# Patient Record
Sex: Male | Born: 1950 | Race: White | Hispanic: No | Marital: Married | Smoking: Never smoker
Health system: Southern US, Community
[De-identification: ages and names within clinical notes are randomized; demographics above are authoritative.]

## PROBLEM LIST (undated history)

## (undated) DIAGNOSIS — T4145XA Adverse effect of unspecified anesthetic, initial encounter: Secondary | ICD-10-CM

## (undated) DIAGNOSIS — K219 Gastro-esophageal reflux disease without esophagitis: Secondary | ICD-10-CM

## (undated) DIAGNOSIS — M1990 Unspecified osteoarthritis, unspecified site: Secondary | ICD-10-CM

## (undated) DIAGNOSIS — A6920 Lyme disease, unspecified: Secondary | ICD-10-CM

## (undated) DIAGNOSIS — R112 Nausea with vomiting, unspecified: Secondary | ICD-10-CM

## (undated) DIAGNOSIS — Z8719 Personal history of other diseases of the digestive system: Secondary | ICD-10-CM

## (undated) DIAGNOSIS — G20 Parkinson's disease: Secondary | ICD-10-CM

## (undated) DIAGNOSIS — T8859XA Other complications of anesthesia, initial encounter: Secondary | ICD-10-CM

## (undated) DIAGNOSIS — N138 Other obstructive and reflux uropathy: Secondary | ICD-10-CM

## (undated) DIAGNOSIS — K5790 Diverticulosis of intestine, part unspecified, without perforation or abscess without bleeding: Secondary | ICD-10-CM

## (undated) DIAGNOSIS — K224 Dyskinesia of esophagus: Secondary | ICD-10-CM

## (undated) DIAGNOSIS — A770 Spotted fever due to Rickettsia rickettsii: Secondary | ICD-10-CM

## (undated) DIAGNOSIS — E785 Hyperlipidemia, unspecified: Secondary | ICD-10-CM

## (undated) DIAGNOSIS — N401 Enlarged prostate with lower urinary tract symptoms: Secondary | ICD-10-CM

## (undated) DIAGNOSIS — K2270 Barrett's esophagus without dysplasia: Secondary | ICD-10-CM

## (undated) DIAGNOSIS — G20A1 Parkinson's disease without dyskinesia, without mention of fluctuations: Secondary | ICD-10-CM

## (undated) DIAGNOSIS — G4700 Insomnia, unspecified: Secondary | ICD-10-CM

## (undated) DIAGNOSIS — Z87442 Personal history of urinary calculi: Secondary | ICD-10-CM

## (undated) DIAGNOSIS — Z9889 Other specified postprocedural states: Secondary | ICD-10-CM

## (undated) HISTORY — PX: HERNIA REPAIR: SHX51

## (undated) HISTORY — DX: Spotted fever due to Rickettsia rickettsii: A77.0

## (undated) HISTORY — PX: LASIK: SHX215

## (undated) HISTORY — DX: Gastro-esophageal reflux disease without esophagitis: K21.9

## (undated) HISTORY — DX: Benign prostatic hyperplasia with lower urinary tract symptoms: N40.1

## (undated) HISTORY — DX: Parkinson's disease: G20

## (undated) HISTORY — DX: Barrett's esophagus without dysplasia: K22.70

## (undated) HISTORY — DX: Insomnia, unspecified: G47.00

## (undated) HISTORY — PX: TONSILLECTOMY AND ADENOIDECTOMY: SUR1326

## (undated) HISTORY — PX: CHOLECYSTECTOMY: SHX55

## (undated) HISTORY — PX: ORCHIOPEXY: SHX479

## (undated) HISTORY — DX: Personal history of other diseases of the digestive system: Z87.19

## (undated) HISTORY — DX: Diverticulosis of intestine, part unspecified, without perforation or abscess without bleeding: K57.90

## (undated) HISTORY — DX: Dyskinesia of esophagus: K22.4

## (undated) HISTORY — DX: Unspecified osteoarthritis, unspecified site: M19.90

## (undated) HISTORY — DX: Parkinson's disease without dyskinesia, without mention of fluctuations: G20.A1

## (undated) HISTORY — DX: Hyperlipidemia, unspecified: E78.5

## (undated) HISTORY — DX: Other obstructive and reflux uropathy: N13.8

---

## 2003-06-28 ENCOUNTER — Encounter: Payer: Self-pay | Admitting: Internal Medicine

## 2005-01-05 ENCOUNTER — Ambulatory Visit: Payer: Self-pay | Admitting: Family Medicine

## 2005-01-17 ENCOUNTER — Ambulatory Visit: Payer: Self-pay | Admitting: Family Medicine

## 2005-01-28 ENCOUNTER — Encounter: Admission: RE | Admit: 2005-01-28 | Discharge: 2005-01-28 | Payer: Self-pay | Admitting: Family Medicine

## 2005-06-11 ENCOUNTER — Ambulatory Visit: Payer: Self-pay | Admitting: Family Medicine

## 2006-01-15 ENCOUNTER — Ambulatory Visit: Payer: Self-pay | Admitting: Family Medicine

## 2006-01-23 ENCOUNTER — Ambulatory Visit: Payer: Self-pay | Admitting: Family Medicine

## 2006-02-14 ENCOUNTER — Ambulatory Visit: Payer: Self-pay | Admitting: Internal Medicine

## 2006-03-13 ENCOUNTER — Ambulatory Visit: Payer: Self-pay | Admitting: Family Medicine

## 2006-05-31 ENCOUNTER — Ambulatory Visit: Payer: Self-pay | Admitting: Internal Medicine

## 2006-07-09 ENCOUNTER — Emergency Department (HOSPITAL_COMMUNITY): Admission: EM | Admit: 2006-07-09 | Discharge: 2006-07-10 | Payer: Self-pay | Admitting: Emergency Medicine

## 2006-07-09 ENCOUNTER — Encounter (INDEPENDENT_AMBULATORY_CARE_PROVIDER_SITE_OTHER): Payer: Self-pay | Admitting: *Deleted

## 2006-11-25 ENCOUNTER — Ambulatory Visit: Payer: Self-pay | Admitting: Family Medicine

## 2006-12-11 ENCOUNTER — Ambulatory Visit: Payer: Self-pay | Admitting: Family Medicine

## 2007-01-20 ENCOUNTER — Ambulatory Visit: Payer: Self-pay | Admitting: Family Medicine

## 2007-01-20 LAB — CONVERTED CEMR LAB
ALT: 34 units/L (ref 0–53)
AST: 32 units/L (ref 0–37)
Alkaline Phosphatase: 36 units/L — ABNORMAL LOW (ref 39–117)
BUN: 18 mg/dL (ref 6–23)
Basophils Relative: 0.8 % (ref 0.0–1.0)
CO2: 32 meq/L (ref 19–32)
Calcium: 9.6 mg/dL (ref 8.4–10.5)
Chloride: 109 meq/L (ref 96–112)
Cholesterol: 177 mg/dL (ref 0–200)
Direct LDL: 102 mg/dL
Eosinophils Absolute: 0.2 10*3/uL (ref 0.0–0.6)
Eosinophils Relative: 3.3 % (ref 0.0–5.0)
GFR calc Af Amer: 113 mL/min
HDL: 39.7 mg/dL (ref >39.0)
Monocytes Relative: 8.5 % (ref 3.0–11.0)
Platelets: 279 10*3/uL (ref 150–400)
RBC: 4.75 M/uL (ref 4.22–5.81)
TSH: 1.85 microintl units/mL (ref 0.35–5.50)
Total Protein: 6.7 g/dL (ref 6.0–8.3)
Triglycerides: 206 mg/dL (ref 0–149)
VLDL: 41 mg/dL — ABNORMAL HIGH (ref 0–40)
WBC: 5.6 10*3/uL (ref 4.5–10.5)

## 2007-01-22 ENCOUNTER — Encounter: Payer: Self-pay | Admitting: Family Medicine

## 2007-01-28 DIAGNOSIS — E785 Hyperlipidemia, unspecified: Secondary | ICD-10-CM

## 2007-01-28 DIAGNOSIS — K219 Gastro-esophageal reflux disease without esophagitis: Secondary | ICD-10-CM

## 2007-02-03 ENCOUNTER — Ambulatory Visit: Payer: Self-pay | Admitting: Family Medicine

## 2007-02-03 DIAGNOSIS — N138 Other obstructive and reflux uropathy: Secondary | ICD-10-CM | POA: Insufficient documentation

## 2007-02-03 DIAGNOSIS — G4700 Insomnia, unspecified: Secondary | ICD-10-CM

## 2007-02-03 DIAGNOSIS — N401 Enlarged prostate with lower urinary tract symptoms: Secondary | ICD-10-CM

## 2007-02-12 ENCOUNTER — Telehealth: Payer: Self-pay | Admitting: Family Medicine

## 2007-03-19 ENCOUNTER — Ambulatory Visit: Payer: Self-pay | Admitting: Internal Medicine

## 2007-07-18 ENCOUNTER — Telehealth (INDEPENDENT_AMBULATORY_CARE_PROVIDER_SITE_OTHER): Payer: Self-pay | Admitting: *Deleted

## 2007-08-11 ENCOUNTER — Ambulatory Visit: Payer: Self-pay | Admitting: Family Medicine

## 2007-08-15 ENCOUNTER — Telehealth: Payer: Self-pay | Admitting: Family Medicine

## 2007-09-09 ENCOUNTER — Telehealth: Payer: Self-pay | Admitting: Family Medicine

## 2008-01-14 ENCOUNTER — Ambulatory Visit: Payer: Self-pay | Admitting: Family Medicine

## 2008-01-15 ENCOUNTER — Telehealth (INDEPENDENT_AMBULATORY_CARE_PROVIDER_SITE_OTHER): Payer: Self-pay | Admitting: *Deleted

## 2008-02-04 ENCOUNTER — Telehealth: Payer: Self-pay | Admitting: Family Medicine

## 2008-02-18 ENCOUNTER — Ambulatory Visit: Payer: Self-pay | Admitting: Family Medicine

## 2008-02-18 LAB — CONVERTED CEMR LAB
Bilirubin Urine: NEGATIVE
Blood in Urine, dipstick: NEGATIVE
Ketones, urine, test strip: NEGATIVE
Nitrite: NEGATIVE
Protein, U semiquant: NEGATIVE
Urobilinogen, UA: 1

## 2008-02-19 LAB — CONVERTED CEMR LAB
ALT: 48 units/L (ref 0–53)
AST: 33 units/L (ref 0–37)
Albumin: 4.1 g/dL (ref 3.5–5.2)
Alkaline Phosphatase: 33 units/L — ABNORMAL LOW (ref 39–117)
BUN: 17 mg/dL (ref 6–23)
Basophils Absolute: 0 10*3/uL (ref 0.0–0.1)
Basophils Relative: 0.1 % (ref 0.0–3.0)
CO2: 27 meq/L (ref 19–32)
Cholesterol: 161 mg/dL (ref 0–200)
Creatinine, Ser: 0.9 mg/dL (ref 0.4–1.5)
Eosinophils Absolute: 0.2 10*3/uL (ref 0.0–0.7)
Eosinophils Relative: 3 % (ref 0.0–5.0)
GFR calc non Af Amer: 93 mL/min
Glucose, Bld: 108 mg/dL — ABNORMAL HIGH (ref 70–99)
HDL: 39.1 mg/dL (ref >39.0)
Hemoglobin: 14.4 g/dL (ref 13.0–17.0)
MCHC: 34.5 g/dL (ref 30.0–36.0)
MCV: 93.2 fL (ref 78.0–100.0)
Neutro Abs: 3.7 10*3/uL (ref 1.4–7.7)
PSA: 0.81 ng/mL (ref 0.10–4.00)
RBC: 4.48 M/uL (ref 4.22–5.81)
Total Protein: 6.7 g/dL (ref 6.0–8.3)
Triglycerides: 108 mg/dL (ref 0–149)

## 2008-02-25 ENCOUNTER — Ambulatory Visit: Payer: Self-pay | Admitting: Family Medicine

## 2008-04-20 ENCOUNTER — Ambulatory Visit: Payer: Self-pay | Admitting: Family Medicine

## 2008-08-11 ENCOUNTER — Telehealth (INDEPENDENT_AMBULATORY_CARE_PROVIDER_SITE_OTHER): Payer: Self-pay | Admitting: *Deleted

## 2008-08-18 ENCOUNTER — Encounter: Payer: Self-pay | Admitting: Family Medicine

## 2008-08-23 ENCOUNTER — Telehealth: Payer: Self-pay | Admitting: Family Medicine

## 2008-10-06 ENCOUNTER — Telehealth: Payer: Self-pay | Admitting: Family Medicine

## 2008-10-11 ENCOUNTER — Telehealth: Payer: Self-pay | Admitting: Family Medicine

## 2008-11-11 ENCOUNTER — Encounter: Payer: Self-pay | Admitting: Family Medicine

## 2008-12-21 ENCOUNTER — Ambulatory Visit: Payer: Self-pay | Admitting: Family Medicine

## 2009-01-03 ENCOUNTER — Ambulatory Visit: Payer: Self-pay | Admitting: Family Medicine

## 2009-01-04 LAB — CONVERTED CEMR LAB
CO2: 33 meq/L — ABNORMAL HIGH (ref 19–32)
Chloride: 101 meq/L (ref 96–112)
Creatinine, Ser: 0.9 mg/dL (ref 0.4–1.5)
Eosinophils Relative: 2.5 % (ref 0.0–5.0)
GFR calc non Af Amer: 92.39 mL/min (ref >60)
Glucose, Bld: 95 mg/dL (ref 70–99)
Lymphocytes Relative: 21.3 % (ref 12.0–46.0)
Monocytes Relative: 5.7 % (ref 3.0–12.0)
Neutrophils Relative %: 70.3 % (ref 43.0–77.0)
Platelets: 215 10*3/uL (ref 150.0–400.0)
TSH: 1.71 microintl units/mL (ref 0.35–5.50)
WBC: 8.5 10*3/uL (ref 4.5–10.5)

## 2009-01-27 ENCOUNTER — Telehealth: Payer: Self-pay | Admitting: Family Medicine

## 2009-01-28 ENCOUNTER — Telehealth: Payer: Self-pay | Admitting: Family Medicine

## 2009-02-11 ENCOUNTER — Ambulatory Visit: Payer: Self-pay | Admitting: Family Medicine

## 2009-02-15 ENCOUNTER — Ambulatory Visit: Payer: Self-pay | Admitting: Cardiology

## 2009-02-16 ENCOUNTER — Telehealth: Payer: Self-pay | Admitting: Family Medicine

## 2009-02-22 ENCOUNTER — Telehealth (INDEPENDENT_AMBULATORY_CARE_PROVIDER_SITE_OTHER): Payer: Self-pay | Admitting: *Deleted

## 2009-02-23 ENCOUNTER — Encounter: Payer: Self-pay | Admitting: Family Medicine

## 2009-02-23 ENCOUNTER — Ambulatory Visit: Payer: Self-pay

## 2009-03-02 ENCOUNTER — Ambulatory Visit: Payer: Self-pay | Admitting: Critical Care Medicine

## 2009-03-02 DIAGNOSIS — J309 Allergic rhinitis, unspecified: Secondary | ICD-10-CM

## 2009-03-03 LAB — CONVERTED CEMR LAB: IgE (Immunoglobulin E), Serum: 16.9 intl units/mL (ref 0.0–180.0)

## 2009-03-16 ENCOUNTER — Encounter: Payer: Self-pay | Admitting: Critical Care Medicine

## 2009-03-16 ENCOUNTER — Ambulatory Visit: Payer: Self-pay | Admitting: Critical Care Medicine

## 2009-03-17 ENCOUNTER — Encounter: Payer: Self-pay | Admitting: Critical Care Medicine

## 2009-03-17 ENCOUNTER — Ambulatory Visit: Payer: Self-pay | Admitting: Family Medicine

## 2009-03-18 ENCOUNTER — Telehealth: Payer: Self-pay | Admitting: Critical Care Medicine

## 2009-03-18 LAB — CONVERTED CEMR LAB
Bilirubin Urine: NEGATIVE
Bilirubin, Direct: 0.1 mg/dL (ref 0.0–0.3)
CO2: 30 meq/L (ref 19–32)
Chloride: 105 meq/L (ref 96–112)
Eosinophils Absolute: 0.2 10*3/uL (ref 0.0–0.7)
Eosinophils Relative: 2.4 % (ref 0.0–5.0)
GFR calc non Af Amer: 81.76 mL/min (ref >60)
Glucose, Bld: 108 mg/dL — ABNORMAL HIGH (ref 70–99)
HDL: 44.3 mg/dL (ref >39.00)
Hemoglobin, Urine: NEGATIVE
LDL Cholesterol: 88 mg/dL (ref 0–99)
Leukocytes, UA: NEGATIVE
Lymphocytes Relative: 23.8 % (ref 12.0–46.0)
MCHC: 34.1 g/dL (ref 30.0–36.0)
MCV: 95 fL (ref 78.0–100.0)
Monocytes Absolute: 0.5 10*3/uL (ref 0.1–1.0)
Neutrophils Relative %: 66.5 % (ref 43.0–77.0)
Nitrite: NEGATIVE
PSA: 1.21 ng/mL (ref 0.10–4.00)
Platelets: 236 10*3/uL (ref 150.0–400.0)
Potassium: 4.3 meq/L (ref 3.5–5.1)
Sodium: 142 meq/L (ref 135–145)
TSH: 0.91 microintl units/mL (ref 0.35–5.50)
Total Bilirubin: 0.9 mg/dL (ref 0.3–1.2)
VLDL: 26 mg/dL (ref 0.0–40.0)
WBC: 7.5 10*3/uL (ref 4.5–10.5)
pH: 6 (ref 5.0–8.0)

## 2009-03-29 ENCOUNTER — Ambulatory Visit: Payer: Self-pay | Admitting: Family Medicine

## 2009-04-12 ENCOUNTER — Encounter: Payer: Self-pay | Admitting: Family Medicine

## 2009-04-12 ENCOUNTER — Ambulatory Visit: Payer: Self-pay | Admitting: Critical Care Medicine

## 2009-04-14 ENCOUNTER — Encounter: Payer: Self-pay | Admitting: Family Medicine

## 2009-08-04 ENCOUNTER — Telehealth: Payer: Self-pay | Admitting: Family Medicine

## 2009-08-05 ENCOUNTER — Telehealth (INDEPENDENT_AMBULATORY_CARE_PROVIDER_SITE_OTHER): Payer: Self-pay | Admitting: *Deleted

## 2009-08-12 ENCOUNTER — Ambulatory Visit: Payer: Self-pay | Admitting: Critical Care Medicine

## 2009-08-12 ENCOUNTER — Encounter: Payer: Self-pay | Admitting: Family Medicine

## 2009-08-12 DIAGNOSIS — J383 Other diseases of vocal cords: Secondary | ICD-10-CM

## 2009-08-29 ENCOUNTER — Ambulatory Visit: Payer: Self-pay | Admitting: Critical Care Medicine

## 2009-09-01 DIAGNOSIS — K5730 Diverticulosis of large intestine without perforation or abscess without bleeding: Secondary | ICD-10-CM | POA: Insufficient documentation

## 2009-09-07 ENCOUNTER — Ambulatory Visit: Payer: Self-pay | Admitting: Internal Medicine

## 2009-09-07 ENCOUNTER — Telehealth: Payer: Self-pay | Admitting: Family Medicine

## 2009-09-09 ENCOUNTER — Ambulatory Visit: Payer: Self-pay | Admitting: Family Medicine

## 2009-09-12 ENCOUNTER — Telehealth: Payer: Self-pay | Admitting: Family Medicine

## 2009-10-21 ENCOUNTER — Telehealth: Payer: Self-pay | Admitting: Family Medicine

## 2009-10-27 ENCOUNTER — Ambulatory Visit: Payer: Self-pay | Admitting: Internal Medicine

## 2009-10-27 ENCOUNTER — Ambulatory Visit (HOSPITAL_COMMUNITY): Admission: RE | Admit: 2009-10-27 | Discharge: 2009-10-27 | Payer: Self-pay | Admitting: Internal Medicine

## 2009-10-28 ENCOUNTER — Encounter: Payer: Self-pay | Admitting: Internal Medicine

## 2009-11-15 ENCOUNTER — Telehealth: Payer: Self-pay | Admitting: Internal Medicine

## 2009-12-13 ENCOUNTER — Ambulatory Visit: Payer: Self-pay | Admitting: Internal Medicine

## 2009-12-13 DIAGNOSIS — K2270 Barrett's esophagus without dysplasia: Secondary | ICD-10-CM

## 2009-12-21 ENCOUNTER — Ambulatory Visit (HOSPITAL_COMMUNITY): Admission: RE | Admit: 2009-12-21 | Discharge: 2009-12-21 | Payer: Self-pay | Admitting: Internal Medicine

## 2010-01-13 ENCOUNTER — Encounter: Payer: Self-pay | Admitting: Family Medicine

## 2010-01-17 ENCOUNTER — Encounter: Payer: Self-pay | Admitting: Family Medicine

## 2010-02-22 ENCOUNTER — Telehealth: Payer: Self-pay | Admitting: Family Medicine

## 2010-03-13 ENCOUNTER — Telehealth: Payer: Self-pay | Admitting: Internal Medicine

## 2010-03-13 ENCOUNTER — Inpatient Hospital Stay (HOSPITAL_COMMUNITY)
Admission: RE | Admit: 2010-03-13 | Discharge: 2010-03-15 | Payer: Self-pay | Source: Home / Self Care | Admitting: Surgery

## 2010-03-13 HISTORY — PX: NISSEN FUNDOPLICATION: SHX2091

## 2010-04-02 ENCOUNTER — Encounter: Payer: Self-pay | Admitting: Family Medicine

## 2010-04-03 ENCOUNTER — Encounter: Payer: Self-pay | Admitting: Family Medicine

## 2010-04-11 ENCOUNTER — Telehealth: Payer: Self-pay | Admitting: Family Medicine

## 2010-04-11 ENCOUNTER — Ambulatory Visit: Payer: Self-pay | Admitting: Family Medicine

## 2010-04-11 LAB — CONVERTED CEMR LAB
Bilirubin Urine: NEGATIVE
Glucose, Urine, Semiquant: NEGATIVE
pH: 5

## 2010-04-12 LAB — CONVERTED CEMR LAB
Albumin: 4.4 g/dL (ref 3.5–5.2)
Basophils Relative: 0.6 % (ref 0.0–3.0)
CO2: 31 meq/L (ref 19–32)
Chloride: 108 meq/L (ref 96–112)
Cholesterol: 162 mg/dL (ref 0–200)
Creatinine, Ser: 0.9 mg/dL (ref 0.4–1.5)
Eosinophils Absolute: 0.2 10*3/uL (ref 0.0–0.7)
Eosinophils Relative: 4.3 % (ref 0.0–5.0)
HDL: 40 mg/dL (ref >39.00)
Hemoglobin: 14.9 g/dL (ref 13.0–17.0)
Lymphocytes Relative: 28.9 % (ref 12.0–46.0)
MCHC: 34.7 g/dL (ref 30.0–36.0)
MCV: 93.4 fL (ref 78.0–100.0)
Monocytes Absolute: 0.4 10*3/uL (ref 0.1–1.0)
Neutro Abs: 3.2 10*3/uL (ref 1.4–7.7)
Neutrophils Relative %: 58.4 % (ref 43.0–77.0)
PSA: 1.24 ng/mL (ref 0.10–4.00)
RBC: 4.59 M/uL (ref 4.22–5.81)
TSH: 1.69 microintl units/mL (ref 0.35–5.50)
Total Protein: 7 g/dL (ref 6.0–8.3)
Triglycerides: 160 mg/dL — ABNORMAL HIGH (ref 0.0–149.0)
VLDL: 32 mg/dL (ref 0.0–40.0)
WBC: 5.6 10*3/uL (ref 4.5–10.5)

## 2010-04-14 ENCOUNTER — Encounter: Payer: Self-pay | Admitting: Family Medicine

## 2010-04-18 ENCOUNTER — Ambulatory Visit: Payer: Self-pay | Admitting: Family Medicine

## 2010-07-06 ENCOUNTER — Encounter: Payer: Self-pay | Admitting: Internal Medicine

## 2010-07-16 DIAGNOSIS — A770 Spotted fever due to Rickettsia rickettsii: Secondary | ICD-10-CM

## 2010-07-16 HISTORY — DX: Spotted fever due to Rickettsia rickettsii: A77.0

## 2010-08-15 NOTE — Miscellaneous (Signed)
Summary[facility]   Imported By: Maryln Gottron 04/05/2010 11:07:23  _____________________________________________________________________  External Attachment:    Type:   Image     Comment:   External Document

## 2010-08-15 NOTE — Letter (Signed)
Summary: [facility]  [facility]   Imported By: Sherian Rein 09/06/2009 09:16:08  _____________________________________________________________________  External Attachment:    Type:   Image     Comment:   External Document

## 2010-08-15 NOTE — Assessment & Plan Note (Signed)
Summary: ? BRONCHITIS//CCM   Vital Signs:  Patient profile:   60 year old male Weight:      210 pounds Temp:     98.0 degrees F oral Pulse rate:   91 / minute BP sitting:   122 / 90  (left arm) Cuff size:   large  Vitals Entered By: Alfred Levins, CMA (September 09, 2009 8:32 AM) CC: cough x3 days   History of Present Illness: Here for 3 days of what he thinks is bronchitis. he has chest congestion and a deep dry cough. No SOB or chest pains. No fever. On Nyquil. He saw Dr. Juanda Chance a few days ago, and she plans to repeat an EGD soon. She will also place a 48 hour pH probe at that time. [name] [name] has been taking Nexium two times a day for the past 3 weeks, but the episodes of SOB have continued. His wife also has URI symptoms.  Current Medications (verified): 1)  Nexium 40 Mg  Cpdr (Esomeprazole Magnesium) .... Take Twice Daily  One Half Hour Before Meal Then Eat 2)  Lipitor 20 Mg  Tabs (Atorvastatin Calcium) .Marland Kitchen.. 1 By Mouth Once Daily 3)  Tricor 145 Mg  Tabs (Fenofibrate) .Marland Kitchen.. 1 By Mouth Once Daily 4)  Flomax 0.4 Mg  Cp24 (Tamsulosin Hcl) .Marland Kitchen.. 1 By Mouth Once Daily 5)  Multivitamins   Tabs (Multiple Vitamin) .Marland Kitchen.. 1 By Mouth Once Daily 6)  Aspir-Low 81 Mg Tbec (Aspirin) .Marland Kitchen.. 1 By Mouth Once Daily 7)  Temazepam 30 Mg Caps (Temazepam) .Marland Kitchen.. 1 By Mouth At Bedtime As Needed  Allergies (verified): 1)  ! Pcn  Past History:  Past Medical History: GERD, sees Dr. 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 Shan Levans  Past Surgical History: Cholecystectomy Tonsillectomy EGD, in 2004 showed GERD only colonoscopy 12-04, showed diverticulosis only, repeat 10 yrs inguinal Hernia Repair  Review of Systems  The patient denies anorexia, fever, weight loss, weight gain, vision loss, decreased hearing, hoarseness, chest pain, syncope, dyspnea on exertion, peripheral edema,  headaches, hemoptysis, abdominal pain, melena, hematochezia, severe indigestion/heartburn, hematuria, incontinence, genital sores, muscle weakness, suspicious skin lesions, transient blindness, difficulty walking, depression, unusual weight change, abnormal bleeding, enlarged lymph nodes, angioedema, breast masses, and testicular masses.    Physical Exam  General:  Well-developed,well-nourished,in no acute distress; alert,appropriate and cooperative throughout examination Head:  Normocephalic and atraumatic without obvious abnormalities. No apparent alopecia or balding. Eyes:  No corneal or conjunctival inflammation noted. EOMI. Perrla. Funduscopic exam benign, without hemorrhages, exudates or papilledema. Vision grossly normal. Ears:  External ear exam shows no significant lesions or deformities.  Otoscopic examination reveals clear canals, tympanic membranes are intact bilaterally without bulging, retraction, inflammation or discharge. Hearing is grossly normal bilaterally. Nose:  External nasal examination shows no deformity or inflammation. Nasal mucosa are pink and moist without lesions or exudates. Mouth:  Oral mucosa and oropharynx without lesions or exudates.  Teeth in good repair. Neck:  No deformities, masses, or tenderness noted. Lungs:  Normal respiratory effort, chest expands symmetrically. Lungs are clear to auscultation, no crackles or wheezes. Heart:  Normal rate and regular rhythm. S1 and S2 normal without gallop, murmur, click, rub or other extra sounds.   Impression & Recommendations:  Problem # 1:  ACUTE BRONCHITIS (ICD-466.0)  His updated medication list for this problem includes:    Zithromax Z-pak 250 Mg Tabs (Azithromycin) .Marland Kitchen... As directed  Hydromet 5-1.5 Mg/73ml Syrp (Hydrocodone-homatropine) .Marland Kitchen... 1 tsp q 4 hours as needed cough  Complete Medication List: 1)  Nexium 40 Mg Cpdr (Esomeprazole magnesium) .... Take twice daily  one half hour before meal then eat 2)   Lipitor 20 Mg Tabs (Atorvastatin calcium) .Marland Kitchen.. 1 by mouth once daily 3)  Tricor 145 Mg Tabs (Fenofibrate) .Marland Kitchen.. 1 by mouth once daily 4)  Flomax 0.4 Mg Cp24 (Tamsulosin hcl) .Marland Kitchen.. 1 by mouth once daily 5)  Multivitamins Tabs (Multiple vitamin) .Marland Kitchen.. 1 by mouth once daily 6)  Aspir-low 81 Mg Tbec (Aspirin) .Marland Kitchen.. 1 by mouth once daily 7)  Temazepam 30 Mg Caps (Temazepam) .Marland Kitchen.. 1 by mouth at bedtime as needed 8)  Zithromax Z-pak 250 Mg Tabs (Azithromycin) .... As directed 9)  Hydromet 5-1.5 Mg/13ml Syrp (Hydrocodone-homatropine) .Marland Kitchen.. 1 tsp q 4 hours as needed cough  Patient Instructions: 1)  Please schedule a follow-up appointment as needed .  Prescriptions: NEXIUM 40 MG  CPDR (ESOMEPRAZOLE MAGNESIUM) Take twice daily  one half hour before meal then eat  #180 x 3   Entered and Authorized by:   Nelwyn Salisbury MD   Signed by:   Nelwyn Salisbury MD on 09/09/2009   Method used:   Print then Give to Patient   RxID:   [id number] HYDROMET 5-1.5 MG/5ML SYRP (HYDROCODONE-HOMATROPINE) 1 tsp q 4 hours as needed cough  #240 x 0   Entered and Authorized by:   Nelwyn Salisbury MD   Signed by:   Nelwyn Salisbury MD on 09/09/2009   Method used:   Print then Give to Patient   RxID:   [id number] ZITHROMAX Z-PAK 250 MG TABS (AZITHROMYCIN) as directed  #1 x 0   Entered and Authorized by:   Nelwyn Salisbury MD   Signed by:   Nelwyn Salisbury MD on 09/09/2009   Method used:   [facility]  [id number]       [address]       [place], [place]  [address]       Ph: [phone] or [phone]       Fax: [phone]   RxID:   [id number]

## 2010-08-15 NOTE — Progress Notes (Signed)
Summary: [facility]  Phone Note From Pharmacy   Caller: [facility] Call For: Dr Clent Ridges  Summary of Call: call about med- Nexium phone [phone] ref #[phone] Initial call taken by: Raechel Ache, RN,  September 07, 2009 1:37 PM  Follow-up for Phone Call        needed directions, 1 by mouth once daily #90 3 refills Follow-up by: Alfred Levins, CMA,  September 07, 2009 1:59 PM

## 2010-08-15 NOTE — Letter (Signed)
Summary: EGD Instructions  [facility]  [address] [place], [place] [address]   Phone: [phone]  Fax: [phone]       OLUFEMI MOFIELD    Jul 13, 1951    MRN: [id number]       Procedure Day /Date: 10/27/09 (Thursday)     Arrival Time: 11:30 am     Procedure Time: 12:30 pm     Location of Procedure:                     _x  _ [facility] ( Outpatient Registration)   PREPARATION FOR ENDOSCOPY with BRAVO PROBE   On 10/27/09 THE DAY OF THE PROCEDURE:  1.   No solid foods, milk or milk products are allowed after midnight the night before your procedure.  2.   Do not drink anything colored red or purple.  Avoid juices with pulp.  No orange juice.  3.  You may drink clear liquids until 8:30 am which is 4 hours before your procedure.                                                                                                CLEAR LIQUIDS INCLUDE: Water Jello Ice Popsicles Tea (sugar ok, no milk/cream) Powdered fruit flavored drinks Coffee (sugar ok, no milk/cream) Gatorade Juice: apple, white grape, white cranberry  Lemonade Clear bullion, consomm, broth Carbonated beverages (any kind) Strained chicken noodle soup Hard Candy   MEDICATION INSTRUCTIONS  Unless otherwise instructed, you should take regular prescription medications with a small sip of water as early as possible the morning of your procedure.    TAKE YOUR NEXIUM ONLY 1 TIME PER DAY BEGINNING 3 DAYS PRIOR TO YOUR TEST PER DR DORA BRODIE.             OTHER INSTRUCTIONS  You will need a responsible adult at least 59 years of age to accompany you and drive you home.   This person must remain in the waiting room during your procedure.  Wear loose fitting clothing that is easily removed.  Leave jewelry and other valuables at home.  However, you may wish to bring a book to read or an iPod/MP3 player to listen to music as you wait for your procedure to start.  Remove all  body piercing jewelry and leave at home.  Total time from sign-in until discharge is approximately 2-3 hours.  You should go home directly after your procedure and rest.  You can resume normal activities the day after your procedure.  The day of your procedure you should not:   Drive   Make legal decisions   Operate machinery   Drink alcohol   Return to work  You will receive specific instructions about eating, activities and medications before you leave.    The above instructions have been reviewed and explained to me by   Hortense Ramal CMA Duncan Dull)  September 07, 2009 11:41 AM     I fully understand and can verbalize these instructions _____________________________ Date 09/07/09

## 2010-08-15 NOTE — Progress Notes (Signed)
Summary: return call please  Phone Note Call from Patient Call back at Home Phone [phone]   Caller: Spouse---debby   live call Summary of Call: Wants cindy to return call about his rx. Initial call taken by: Warnell Forester,  September 12, 2009 12:17 PM  Follow-up for Phone Call        Phone Call Completed, Rx Called In Follow-up by: Alfred Levins, CMA,  September 12, 2009 1:02 PM    Prescriptions: NEXIUM 40 MG  CPDR (ESOMEPRAZOLE MAGNESIUM) Take twice daily  one half hour before meal then eat  #180 x 3   Entered by:   Alfred Levins, CMA   Authorized by:   Nelwyn Salisbury MD   Signed by:   Alfred Levins, CMA on 09/12/2009   Method used:   Electronically to        [facility] (mail-order)             ,          Ph: [phone]       Fax: [phone]   RxID:   [id number]

## 2010-08-15 NOTE — Progress Notes (Signed)
  Phone Note Outgoing Call   Call placed by: D.Chisom Aust Call placed to: the pt Summary of Call: Results of 48hr [facility] pH monitoring was discussed with pt's wife, The study was positive . I have asked pt to increase his Nexiem to 40mg  by mouth two times a day till he sees me for appointment in 2 weeks. Initial call taken by: Hart Carwin MD,  Nov 15, 2009 6:05 PM

## 2010-08-15 NOTE — Assessment & Plan Note (Signed)
Summary: CPX // RS wife rsc/njr   Vital Signs:  Patient profile:   60 year old male Weight:      190 pounds O2 Sat:      98 % Temp:     97.8 degrees F Pulse rate:   83 / minute BP sitting:   120 / 80  (left arm)  Vitals Entered By: Pura Spice, RN (April 18, 2010 9:18 AM) CC: cpx discuss labs    History of Present Illness: 60 yr old male for a cpx. He feels great and has no concerns. He had been struggling with some SOB and chronic fatigue from lack of sleep for the past 2 years, but after his recent fundiplication surgery these symptoms have disappeared. He has adjusted to his dietary restrictions well. He remains under a lot of stress at work, but he plans to leave this job in the next year or two.   Allergies: 1)  ! Pcn  Past History:  Past Medical History: GERD, sees Dr. 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 Shan Levans Barrett's esophagus hx of hiatal hernia  Past Surgical History: Cholecystectomy Tonsillectomy EGD, in 2004 showed GERD only colonoscopy 12-04, showed diverticulosis only, repeat 10 yrs inguinal Hernia Repair laparoscopic [employer] fundiplication 03-13-10 per Dr. Luretha Murphy  Past History:  Care Management: Ophthalmology: last eye exam 02-2010  at [facility]  Family History: Reviewed history from 09/01/2009 and no changes required. Family History of Arthritis-RA mother Family History High cholesterol Family History Hypertension Family History Lung cancer Family History of Esophageal Cancer: Father, 2 Aunts No FH of Colon Cancer:  Social History: Reviewed history from 03/02/2009 and no changes required. Just moved to [place] 67yrs ago,  moved from [place] 69yrs then Sales promotion account executive for three years in [employer] Married Never Smoked Alcohol use-yes 2  daughters Occupation: Actuary for [employer] Occupation:  employed  Review of Systems  The patient denies anorexia, fever, weight loss, weight gain, vision loss, decreased hearing, hoarseness, chest pain, syncope, dyspnea on exertion, peripheral edema, prolonged cough, headaches, hemoptysis, abdominal pain, melena, hematochezia, severe indigestion/heartburn, hematuria, incontinence, genital sores, muscle weakness, suspicious skin lesions, transient blindness, difficulty walking, depression, unusual weight change, abnormal bleeding, enlarged lymph nodes, angioedema, breast masses, and testicular masses.    Physical Exam  General:  Well-developed,well-nourished,in no acute distress; alert,appropriate and cooperative throughout examination Head:  Normocephalic and atraumatic without obvious abnormalities. No apparent alopecia or balding. Eyes:  No corneal or conjunctival inflammation noted. EOMI. Perrla. Funduscopic exam benign, without hemorrhages, exudates or papilledema. Vision grossly normal. Ears:  External ear exam shows no significant lesions or deformities.  Otoscopic examination reveals clear canals, tympanic membranes are intact bilaterally without bulging, retraction, inflammation or discharge. Hearing is grossly normal bilaterally. Nose:  External nasal examination shows no deformity or inflammation. Nasal mucosa are pink and moist without lesions or exudates. Mouth:  Oral mucosa and oropharynx without lesions or exudates.  Teeth in good repair. Neck:  No deformities, masses, or tenderness noted. Chest Wall:  No deformities, masses, tenderness or gynecomastia noted. Lungs:  Normal respiratory effort, chest expands symmetrically. Lungs are clear to auscultation, no crackles or wheezes. Heart:  Normal rate and regular rhythm. S1 and S2 normal without gallop, murmur, click, rub or other extra sounds. Abdomen:  Bowel sounds positive,abdomen soft and non-tender without masses,  organomegaly or  hernias noted. Rectal:  No external abnormalities noted. Normal sphincter tone. No rectal masses or tenderness. Stools heme neg. Genitalia:  Testes bilaterally descended without nodularity, tenderness or masses. No scrotal masses or lesions. No penis lesions or urethral discharge. Prostate:  no nodules, no asymmetry, no induration, and 2+ enlarged.   Msk:  No deformity or scoliosis noted of thoracic or lumbar spine.   Pulses:  R and L carotid,radial,femoral,dorsalis pedis and posterior tibial pulses are full and equal bilaterally Extremities:  No clubbing, cyanosis, edema, or deformity noted with normal full range of motion of all joints.   Neurologic:  No cranial nerve deficits noted. Station and gait are normal. Plantar reflexes are down-going bilaterally. DTRs are symmetrical throughout. Sensory, motor and coordinative functions appear intact. Skin:  Intact without suspicious lesions or rashes Cervical Nodes:  No lymphadenopathy noted Axillary Nodes:  No palpable lymphadenopathy Inguinal Nodes:  No significant adenopathy Psych:  Cognition and judgment appear intact. Alert and cooperative with normal attention span and concentration. No apparent delusions, illusions, hallucinations   Impression & Recommendations:  Problem # 1:  HEALTH MAINTENANCE EXAM (ICD-V70.0)  Orders: Hemoccult Guaiac-1 spec.(in office) (82270)  Complete Medication List: 1)  Lipitor 20 Mg Tabs (Atorvastatin calcium) .Marland Kitchen.. 1 by mouth once daily 2)  Flomax 0.4 Mg Cp24 (Tamsulosin hcl) .Marland Kitchen.. 1 by mouth once daily 3)  Multivitamins Tabs (Multiple vitamin) .Marland Kitchen.. 1 by mouth once daily 4)  Aspir-low 81 Mg Tbec (Aspirin) .Marland Kitchen.. 1 by mouth once daily 5)  Temazepam 30 Mg Caps (Temazepam) .Marland Kitchen.. 1 by mouth at bedtime as needed 6)  Fenofibrate 160 Mg Tabs (Fenofibrate) .... Once daily  Patient Instructions: 1)  Please schedule a follow-up appointment in 6 months .  Prescriptions: FLOMAX 0.4 MG  CP24 (TAMSULOSIN  HCL) 1 by mouth once daily  #90 x 3   Entered and Authorized by:   Nelwyn Salisbury MD   Signed by:   Nelwyn Salisbury MD on 04/18/2010   Method used:   Print then Give to Patient   RxID:   [id number] FENOFIBRATE 160 MG TABS (FENOFIBRATE) once daily  #90 x 3   Entered and Authorized by:   Nelwyn Salisbury MD   Signed by:   Nelwyn Salisbury MD on 04/18/2010   Method used:   Print then Give to Patient   RxID:   [id number] LIPITOR 20 MG  TABS (ATORVASTATIN CALCIUM) 1 by mouth once daily  #90 x 3   Entered and Authorized by:   Nelwyn Salisbury MD   Signed by:   Nelwyn Salisbury MD on 04/18/2010   Method used:   Print then Give to Patient   RxID:   [id number]

## 2010-08-15 NOTE — Letter (Signed)
Summary: [facility]   Imported By: Maryln Gottron 01/27/2010 14:57:29  _____________________________________________________________________  External Attachment:    Type:   Image     Comment:   External Document

## 2010-08-15 NOTE — Letter (Signed)
Summary: [facility]   Imported By: Maryln Gottron 05/04/2010 10:05:08  _____________________________________________________________________  External Attachment:    Type:   Image     Comment:   External Document

## 2010-08-15 NOTE — Progress Notes (Signed)
Summary: RX question   LMTCB 08/05/2008  Phone Note Call from Patient   Caller: Patient Call For: Nelwyn Salisbury MD Summary of Call: Pt calls stating Symbicort has never helped his asthma, and would like Dr. Clent Ridges to order a different medication.  [facility] [address] [place] [phone]  and [phone]  ok to speak to wife. Initial call taken by: Lynann Beaver CMA,  August 04, 2009 12:54 PM  Follow-up for Phone Call        Pt called in to ck on status of earlier phone call. Follow-up by: Debbra Riding,  August 04, 2009 4:26 PM  Additional Follow-up for Phone Call Additional follow up Details #1::        he needs to ask Dr. Delford Field, his Pulmonary doctor  [facility] 08/05/2008 Debby [facility] CMA  August 05, 2009 9:20 AM  Additional Follow-up by: Nelwyn Salisbury MD,  August 05, 2009 9:14 AM    Additional Follow-up for Phone Call Additional follow up Details #2::    Pt notified. of Dr. Claris Che recommendations. Follow-up by: Lynann Beaver CMA,  August 05, 2009 9:31 AM

## 2010-08-15 NOTE — Progress Notes (Signed)
Summary: Temazepam refill  Phone Note Refill Request Message from:  Fax from Pharmacy on April 11, 2010 8:33 AM  Refills Requested: Medication #1:  TEMAZEPAM 30 MG CAPS 1 by mouth at bedtime as needed   Dosage confirmed as above?Dosage Confirmed Please advise refill?  Initial call taken by: Josph Macho RMA,  April 11, 2010 8:33 AM  Follow-up for Phone Call        call in #30 with 5 rf Follow-up by: Nelwyn Salisbury MD,  April 11, 2010 10:54 AM    Prescriptions: TEMAZEPAM 30 MG CAPS (TEMAZEPAM) 1 by mouth at bedtime as needed  #30 x 5   Entered by:   Lynann Beaver CMA   Authorized by:   Nelwyn Salisbury MD   Signed by:   Lynann Beaver CMA on 04/11/2010   Method used:   [facility]ph[facility] to ...       [facility]  [id number]       [address]       [place], [place]  [address]       Ph: [phone] or [phone]       Fax: [phone]   RxID:   [id number]

## 2010-08-15 NOTE — Procedures (Signed)
Summary: [facility]  [name]   Imported By: Lester Hephzibah 11/18/2009 09:48:12  _____________________________________________________________________  External Attachment:    Type:   Image     Comment:   External Document

## 2010-08-15 NOTE — Progress Notes (Signed)
Summary: INFO ONLY  Phone Note Call from Patient   Caller: Spouse  [facility])  [phone] Summary of Call: INFO ONLY-------Pts wife Eunice Blase) called to let Dr Clent Ridges know that her husband will be going for surgery on  8/29  ( Nissen Fundoplication )  w/  Dr. Luretha Murphy / [facility].  Initial call taken by: Debbra Riding,  February 22, 2010 11:50 AM  Follow-up for Phone Call        noted Follow-up by: Nelwyn Salisbury MD,  February 22, 2010 1:15 PM

## 2010-08-15 NOTE — Assessment & Plan Note (Signed)
Summary: WORSENING GERD            DEBORAH   History of Present Illness Visit Type: Follow-up Visit Primary GI MD: Lina Sar MD Primary Provider: Gershon Crane, MD  Requesting Provider: n/a Chief Complaint: Worsening GERD  History of Present Illness:   This is a 60 year old white male with chronic gastroesophageal reflux and chronic esophagitis seen on esophageal biopsies from an upper endoscopy in [place] in December 2004. He had chronic inflammation but no evidence of goblet cells.which is a sine qua non of the Barrett's esophagus.  He has been on a chronic antireflux regimen taking Nexium 40 mg every morning. He has periodic exacerbations of the burning and cough. The most recent exacerbation started  several weeks ago.His wife, interestingly, is also having cough. Symptoms are usually precipitated by late-night meals, by doing situps or yardwork. It also seems to be precipitated by stress. A colonoscopy in December 2004 showed moderately severe diverticulosis of the left colon. Additional medical problems include a family history of esophageal cancer in his father. Patient is status post cholecystectomy and repair of an undescended testicle. He has BPH and had a removal of lipomas. He has had problems with conscious sedation in the past. He woke up during his hernia repair as well as during his previous endoscopy and colonoscopy.   GI Review of Systems    Reports acid reflux and  heartburn.      Denies abdominal pain, belching, bloating, chest pain, dysphagia with liquids, dysphagia with solids, loss of appetite, nausea, vomiting, vomiting blood, weight loss, and  weight gain.        Denies anal fissure, black tarry stools, change in bowel habit, constipation, diarrhea, diverticulosis, fecal incontinence, heme positive stool, hemorrhoids, irritable bowel syndrome, jaundice, light color stool, liver problems, rectal bleeding, and  rectal pain.    Current Medications (verified): 1)  Nexium  40 Mg  Cpdr (Esomeprazole Magnesium) .... Take Twice Daily  One Half Hour Before Meal Then Eat 2)  Lipitor 20 Mg  Tabs (Atorvastatin Calcium) .Marland Kitchen.. 1 By Mouth Once Daily 3)  Tricor 145 Mg  Tabs (Fenofibrate) .Marland Kitchen.. 1 By Mouth Once Daily 4)  Flomax 0.4 Mg  Cp24 (Tamsulosin Hcl) .Marland Kitchen.. 1 By Mouth Once Daily 5)  Multivitamins   Tabs (Multiple Vitamin) .Marland Kitchen.. 1 By Mouth Once Daily 6)  Aspir-Low 81 Mg Tbec (Aspirin) .Marland Kitchen.. 1 By Mouth Once Daily 7)  Temazepam 30 Mg Caps (Temazepam) .Marland Kitchen.. 1 By Mouth At Bedtime As Needed  Allergies (verified): 1)  ! Pcn  Past History:  Past Medical History: Reviewed history from 08/29/2009 and no changes required. 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 Cholecystectomy Tonsillectomy EGD, in 2004 showed GERD only, no follow  ups needed colonoscopy 12-04, showed diverticulosis only, repeat 10 yrs Hernia Repair  Family History: Reviewed history from 09/01/2009 and no changes required. Family History of Arthritis-RA mother Family History High cholesterol Family History Hypertension Family History Lung cancer Family History of Esophageal Cancer: Father, 2 Aunts No FH of Colon Cancer:  Social History: Reviewed history from 03/02/2009 and no changes required. 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 [employer] Married Never Smoked Alcohol  use-yes 2 daughters  Review of Systems  The patient denies allergy/sinus, anemia, anxiety-new, arthritis/joint pain, back pain, blood in urine, breast changes/lumps, change in vision, confusion, cough, coughing up blood, depression-new, fainting, fatigue, fever,  headaches-new, hearing problems, heart murmur, heart rhythm changes, itching, muscle pains/cramps, night sweats, nosebleeds, shortness of breath, skin rash, sleeping problems, sore throat, swelling of feet/legs, swollen lymph glands, thirst - excessive, urination - excessive, urination changes/pain, urine leakage, vision changes, and voice change.         Pertinent positive and negative review of systems were noted in the above HPI. All other ROS was otherwise negative.   Vital Signs:  Patient profile:   60 year old male Height:      72 inches Weight:      208 pounds BMI:     28.31 BSA:     2.17 Pulse rate:   88 / minute Pulse rhythm:   regular BP sitting:   120 / 78  (left arm) Cuff size:   regular  Vitals Entered By: Ok Anis CMA (September 07, 2009 10:05 AM)  Physical Exam  General:  alert, oriented and in no distress. His voice is normal and he has had no cough during our conversation. Mouth:  No deformity or lesions, dentition normal. Neck:  Supple; no masses or thyromegaly. Lungs:  Clear throughout to auscultation. Heart:  Regular rate and rhythm; no murmurs, rubs,  or bruits. Abdomen:  good abdominal support normoactive bowel sounds. No tenderness. Post cholecystectomy scar. Extremities:  No clubbing, cyanosis, edema or deformities noted. Skin:  Intact without significant lesions or rashes.   Impression & Recommendations:  Problem # 1:  ESOPHAGITIS, HX OF (ICD-V12.79) Patient has chronic gastroesophageal reflux only partially controlled with Nexium 40 mg daily. An upper endoscopy in the past showed chronic esophagitis but no evidence of Barrett's esophagus. His father died of esophageal cancer, having had reflux for many years prior to that. We will proceed with an upper endoscopy and biopsies to rule out Barrett's esophagus. He has increased his Nexium to 40 mg twice a day. We will also obtain a 48 hour intraesophageal pH probe using a Bravo probe placed endoscopically  during his scheduled endoscopy. The Bravo probe will be done on one Nexium a day. If it shows inadequate acid suppression, he will have to take his Nexium twice a day over a long period of time. He is denying dysphagia or odynophagia at this time. I have asked him to follow strict  antireflux measures; specifically instructing him not to do sit-ups after dinner.  Orders: ZEGD (ZEGD)  Problem # 2:  DIVERTICULOSIS, COLON (ICD-562.10) Patient will be due for a recall colonoscopy in September 2014.  Patient Instructions: 1)  upper endoscopy with biopsies. 2)  48 hour Bravo probe for intraesophageal pH monitoring. 3)  Stricture antireflux measures. Avoid exercising after supper. 4)  Continue Nexium 40 mg twice a day until 3 days prior to the endoscopy when he reduces his nexium to One-A-Day. 5)  Recall colonoscopy September 2014. 6)  Copy sent to : Dr P.Wright 7)  The medication list was reviewed and reconciled.  All changed / newly prescribed medications were explained.  A complete medication list was provided to the patient / caregiver.

## 2010-08-15 NOTE — Progress Notes (Signed)
Summary: Courtesy call  Phone Note From Other Clinic   Caller: Dr Daphine Deutscher Call For: Dr Juanda Chance Summary of Call: Courtesy call from nurse at hosp that Dr. Daphine Deutscher wanted to let Dr. Juanda Chance know that pt has had surgery and is in [facility].     Initial call taken by: Misty Stanley,  March 13, 2010 4:38 PM  Follow-up for Phone Call        see the OP note in EMR Darcey Nora RN, [facility]  March 14, 2010 8:01 AM Follow-up by: Darcey Nora RN, CGRN,  March 14, 2010 8:02 AM

## 2010-08-15 NOTE — Assessment & Plan Note (Signed)
Summary: Pulmonary OV   Copy to:  Dr. Clent Ridges Primary Provider/Referring Provider:  Dr. Clent Ridges  CC:  Pt here for follow up.  Pt states no difference since d/c of Dulera.  History of Present Illness: Pulmonary OV      This is a 60 year old male with vocal cord dysfunction, no true asthma, GERD with LPR, hx of reflux esophagitis on EDG 2004  April 12, 2009 9:29 AM Pt is improved with less dyspnea and cough.  There is still pn drip and sneezing.  Occ upper airway wheeze.  Some breakthrough GERD with dietary indiscretion. There are not any alleviating or precipitating factors noted.  The symptoms do not generally fluctuate unless with not following reflux diet. Astepro ineffective Pt denies any significant sore throat, nasal congestion or excess secretions, fever, chills, sweats, unintended weight loss, pleurtic or exertional chest pain, orthopnea PND, or leg swelling   August 12, 2009 10:52 AM [name] [name] times three days and no real change.   Pt noted with or without symbicort was no better .  Notes more upper airway obstruction.  Noting severe throat burning on or off symbicort with ongoing heartburn.   Never had SABA or PFM.   No long term dx of asthma in the past. August 29, 2009 2:51 PM Still has episodes of tightness in the chest.  Pt kept diary and no correlation with symptoms and PFR.  PFR was normal.  .  Chest started burning after last ov and bothered for three days.  Hx of HH.  Stress issues still at work and diet is not perfect.   Xopenex hfa no help.  No change in symptoms off Dulera over two weeks. Not been to GI since recently.  Was scoped:  at age 64.  Barretts??  In [place].    Preventive Screening-Counseling & Management  Alcohol-Tobacco     Smoking Status: never     Passive Smoke Exposure: yes  Current Medications (verified): 1)  Nexium 40 Mg  Cpdr (Esomeprazole Magnesium) .... Take Twice Daily  One Half Hour Before Meal Then Eat 2)  Lipitor 20 Mg  Tabs  (Atorvastatin Calcium) .Marland Kitchen.. 1 By Mouth Once Daily 3)  Tricor 145 Mg  Tabs (Fenofibrate) .Marland Kitchen.. 1 By Mouth Once Daily 4)  Flomax 0.4 Mg  Cp24 (Tamsulosin Hcl) .Marland Kitchen.. 1 By Mouth Once Daily 5)  Multivitamins   Tabs (Multiple Vitamin) .Marland Kitchen.. 1 By Mouth Once Daily 6)  Aspir-Low 81 Mg Tbec (Aspirin) .Marland Kitchen.. 1 By Mouth Once Daily 7)  Temazepam 30 Mg Caps (Temazepam) .Marland Kitchen.. 1 By Mouth At Bedtime As Needed  Allergies (verified): 1)  ! Pcn  Past History:  Past medical, surgical, family and social histories (including risk factors) reviewed, and no changes noted (except as noted below).  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 Cholecystectomy Tonsillectomy EGD, in 2004 showed GERD only, no follow  ups needed colonoscopy 12-04, showed diverticulosis only, repeat 10 yrs  Past Pulmonary History:  Pulmonary History: Pulmonary Function Test  Date: 03/16/2009 Height (in.): 72 Gender: Male  Pre-Spirometry  FVC     Value: 4.78 L/min   Pred: 4.99 L/min     % Pred: 96 % FEV1     Value: 2.93 L     Pred: 3.54 L     % Pred: 83 % FEV1/FVC  Value: 61 %     Pred: 71 %     FEF 25-75   Value: 1.41 L/min   Pred: 3.33 L/min     % Pred: 42 %  Post-Spirometry  FVC     Value: 4.80 L/min   Pred: 4.99 L/min     % Pred: 96 % FEV1     Value: 3.15 L     Pred: 3.54 L     % Pred: 89 % FEV1/FVC   Value: 66 %     Pred: 71 %     FEF 25-75   Value: 1.77 L/min   Pred: 3.33 L/min     % Pred: 53 %  Lung Volumes  TLC     Value: 6.88 L   % Pred: 96 % RV     Value: 2.10 L   % Pred: 86 % DLCO     Value: 28.6 %   % Pred: 106 % DLCO/VA   Value: 4.72 %   % Pred: 123 %  Comments:  Moderate peripheral airflow obstruction, normal lung volumes,  elevated DLCO c/w asthma/airway hyperinflation/obstruction Significant reversability with  bronchodilators  Evaluation:  moderate obstruction with significant bronchodilator response  Family History: Reviewed history from 03/02/2009 and no changes required. Family History of Arthritis-RA mother Family History High cholesterol Family History Hypertension Family History Lung cancer father and two aunts with esophageal cancer  Social History: Reviewed history from 03/02/2009 and no changes required. 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  The patient denies shortness of breath at rest, productive cough, non-productive cough, coughing up blood, chest pain, irregular heartbeats, loss of appetite, weight change, abdominal pain, difficulty swallowing, sore throat, tooth/dental problems, headaches, nasal congestion/difficulty breathing through nose, sneezing, itching, ear ache, anxiety, depression, hand/feet swelling, joint stiffness or pain, rash, change in color of mucus, and fever.    Vital Signs:  Patient profile:   60 year old male Height:      72 inches Weight:      211 pounds O2 Sat:      97 % on Room air Temp:     97.8 degrees F oral Pulse rate:   89 / minute BP sitting:   118 / 80  (left arm) Cuff size:   regular  Vitals Entered By: Zackery Barefoot CMA (August 29, 2009 2:42 PM)  O2 Flow:  Room air CC: Pt here for follow up.  Pt states no difference since d/c of Dulera Comments Medications reviewed with patient  Verified pt's contact number Zackery Barefoot CMA  August 29, 2009 2:43 PM    Physical Exam  Additional Exam:  Gen: Pleasant, well-nourished, in no distress,   normal affect ENT: No lesions,  mouth clear,  oropharynx clear, no postnasal drip Neck: No JVD, no TMG, no carotid bruits Lungs: No use of accessory muscles, no dullness to percussion, clear without rales or rhonchi, prominent pseudowheeze Cardiovascular: RRR, heart sounds normal, no murmur or gallops, no peripheral edema Abdomen: soft and NT, no HSM,  BS normal Musculoskeletal: No deformities, no cyanosis or clubbing Neuro: alert, non focal Skin: Warm,  no lesions or rashes    Impression & Recommendations:  Problem # 1:  VOCAL CORD DISORDER (ICD-478.5) Assessment Unchanged  Orders: Est. Patient Level III (57846)  Chronic episodic dyspnea,  despite mild peripheral airflow obstruction on spirometry, no evidence of change with LABA/ICS.  Exp flow volume loop shows significant upper airway obstruction c/w vocal cord dysfunction  I doubt true asthma in this case.  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 Peak flow meter was normal  plan stay off dulera and hfa SABA Stay on two times a day PPI for one more week then daily consider EGD soon  ? Barretts  reflux diet rov 3 months  Medications Added to Medication List This Visit: 1)  Nexium 40 Mg Cpdr (Esomeprazole magnesium) .... Take twice daily  one half hour before meal then eat  Complete Medication List: 1)  Nexium 40 Mg Cpdr (Esomeprazole magnesium) .... Take twice daily  one half hour before meal then eat 2)  Lipitor 20 Mg Tabs (Atorvastatin calcium) .Marland Kitchen.. 1 by mouth once daily 3)  Tricor 145 Mg Tabs (Fenofibrate) .Marland Kitchen.. 1 by mouth once daily 4)  Flomax 0.4 Mg Cp24 (Tamsulosin hcl) .Marland Kitchen.. 1 by mouth once daily 5)  Multivitamins Tabs (Multiple vitamin) .Marland Kitchen.. 1 by mouth once daily 6)  Aspir-low 81 Mg Tbec (Aspirin) .Marland Kitchen.. 1 by mouth once daily 7)  Temazepam 30 Mg Caps (Temazepam) .Marland Kitchen.. 1 by mouth at bedtime as needed  Patient Instructions: 1)  Stop all inhalers 2)  Watch your diet 3)  Nexium twice a day for another week then once daily 4)  I will  discuss your case with Dr Juanda Chance for repeat endoscopy when you get your colonoscopy 5)  Return Pulmonary in 3-4 months   Immunization History:  Influenza Immunization History:    Influenza:  historical (04/18/2009)  Pneumovax Immunization History:    Pneumovax:  historical (04/18/2009)

## 2010-08-15 NOTE — Procedures (Signed)
Summary: Upper Endoscopy  Patient: [name] [name] Note: All result statuses are Final unless otherwise noted.  Tests: (1) Upper Endoscopy (EGD)   EGD Upper Endoscopy       DONE     [facility]     [address] [place], [place]  [address]           ENDOSCOPY PROCEDURE REPORT           PATIENT:  [name], [name]  MR#:  [id number]     BIRTHDATE:  [birth year], 58 yrs. old  GENDER:  male           ENDOSCOPIST:  Hedwig Morton. Juanda Chance, MD     Referred by:  Tera Mater Clent Ridges, M.D.           PROCEDURE DATE:  10/27/2009     PROCEDURE:  EGD with biopsy, EGD with foreign body removal, Bravo     pH probe placement     ASA CLASS:  Class II     INDICATIONS:  GERD, heartburn GERD refractory to PPI's,     father with esophageal cancer,     last EGD 2004 in [place], told to have Barrett's but the review     of Path report did not show goblet cells           MEDICATIONS:   MAC sedation, administered by CRNA     TOPICAL ANESTHETIC:  none           DESCRIPTION OF PROCEDURE:   After the risks benefits and     alternatives of the procedure were thoroughly explained, informed     consent was obtained.  The  endoscope was introduced through the     mouth and advanced to the second portion of the duodenum, without     limitations.  The instrument was slowly withdrawn as the mucosa     was fully examined.     <<PROCEDUREIMAGES>>           irregular Z-line. placement of Bravo probe at 31 cm ( which is 6     cm above the z-line) With standard forceps, a biopsy was obtained     and sent to pathology (see image3, image4, image5, image6, and     image7). z-line at 31 cm  Otherwise the examination was normal     (see image2 and image1).    Retroflexed views revealed no     abnormalities.    The scope was then withdrawn from the patient     and the procedure completed.           COMPLICATIONS:  None           ENDOSCOPIC IMPRESSION:     1) Irregular Z-line     2) Otherwise normal  examination     s/p biopsies to r/o Barrett's     placement of Bravo probe at 31 cm from the icissors     RECOMMENDATIONS:     1) Await pathology results     await results of Bravo probe     cont. Nexiem 40 mg po qd           REPEAT EXAM:  In 0 year(s) for.           ______________________________     Hedwig Morton. Juanda Chance, MD           CC:           n.  eSIGNED:   Hedwig Morton. Jaxxon Naeem at 10/27/2009 01:16 PM           Wilver, Tignor, [id number]  Note: An exclamation mark (!) indicates a result that was not dispersed into the flowsheet. Document Creation Date: 10/27/2009 1:17 PM _______________________________________________________________________  (1) Order result status: Final Collection or observation date-time: 10/27/2009 13:05 Requested date-time:  Receipt date-time:  Reported date-time:  Referring Physician:   Ordering Physician: Lina Sar [id number]) Specimen Source:  Source: Launa Grill Order Number: [id number] Lab site:   Appended Document: Upper Endoscopy Recall is in IDX for 10/2011.

## 2010-08-15 NOTE — Procedures (Signed)
Summary: Colonoscopy/[facility] [place]   Imported By: Sherian Rein 09/06/2009 09:10:47  _____________________________________________________________________  External Attachment:    Type:   Image     Comment:   External Document

## 2010-08-15 NOTE — Progress Notes (Signed)
Summary:  nexium rx problems [facility]  Phone Note Call from Patient   Caller: Patient Call For: Nelwyn Salisbury MD Summary of Call: Pt states he does not see Dr. Juanda Chance every year, and needs Dr. Clent Ridges to take care of his Nexium needs to [facility].  Please call him at [phone] Initial call taken by: [facility] CMA,  October 21, 2009 4:09 PM  Follow-up for Phone Call        please call the pt. about this. I reviewed his records, and on 09-12-09 we sent in an electronic refill for Nexium to take it two times a day with refills for a year. apparently [facility] mixed this up. Today was the first I heard about this. If we need to prior authorize something, we can but there is a process to do this Follow-up by: Nelwyn Salisbury MD,  October 21, 2009 5:05 PM  Additional Follow-up for Phone Call Additional follow up Details #1::        spoke with pt's wife , explained that we rx's to [facility] on 09/12/09 two times a day dosing #180 3 rf - and have not rec'd anything about a pre auth or complications filling this med. She will f/u with [facility]. KIK Additional Follow-up by: Duard Brady LPN,  October 21, 2009 5:27 PM

## 2010-08-15 NOTE — Medication Information (Signed)
Summary: [facility] Approved  [facility] Approved   Imported By: Maryln Gottron 01/19/2010 10:48:14  _____________________________________________________________________  External Attachment:    Type:   Image     Comment:   External Document

## 2010-08-15 NOTE — Miscellaneous (Signed)
Summary: flu inj given at [facility]   Clinical Lists Changes  Observations: Added new observation of FLU VAX: Historical (04/02/2010 11:53)      Immunization History:  Influenza Immunization History:    Influenza:  historical (04/02/2010) [facility] at [facility] ...gh rn....Marland KitchenMarland Kitchen

## 2010-08-15 NOTE — Assessment & Plan Note (Signed)
Summary: Pulmonary OV   Copy to:  Dr. Clent Ridges Primary Provider/Referring Provider:  Dr. Clent Ridges  CC:  4 mo follow up.  states no changes in breathing.  was placed on dulera 08/05/09 inplace of symbicort-states breathing seems to be better while on dulera but thinks he needs to give it a little longer to see if it is actually working. Marland Kitchen  History of Present Illness: Pulmonary OV      This is a 60 year old male with moderate persistent asthma and allergic rhinitis with peripheral airflow obstruction on PFTs This pt has had exertional dyspnea for 4 months in 2010. Dyspnea comes and goes and appears to be upper airway in nature.  Started after URI 4/10.  3 diff ABX without relief. Pt on Qvar for 3 weeks and no change. Pt wiht dry cough.  CXR and CT chest neg.  TMT neg.  Upper airway and throat feel irriated. No pn drip.  Pt is hoarse but no sore throat.  Hx GERD and nexium helps.  No at bedtime dyspnea.  Feels like he cannot get his air in or out of the throat.  Life long never smoker.   April 12, 2009 9:29 AM Pt is improved with less dyspnea and cough.  There is still pn drip and sneezing.  Occ upper airway wheeze.  Some breakthrough GERD with dietary indiscretion. There are not any alleviating or precipitating factors noted.  The symptoms do not generally fluctuate unless with not following reflux diet. Astepro ineffective Pt denies any significant sore throat, nasal congestion or excess secretions, fever, chills, sweats, unintended weight loss, pleurtic or exertional chest pain, orthopnea PND, or leg swelling   August 12, 2009 10:52 AM [name] [name] times three days and no real change.   Pt noted with or without symbicort was no better .  Notes more upper airway obstruction.  Noting severe throat burning on or off symbicort with ongoing heartburn.   Never had SABA or PFM.   No long term dx of asthma in the past.   Preventive Screening-Counseling & Management  Alcohol-Tobacco     Smoking  Status: never  Current Medications (verified): 1)  Nexium 40 Mg  Cpdr (Esomeprazole Magnesium) .... Once A Day One Half Hour Before Meal Then Eat 2)  Lipitor 20 Mg  Tabs (Atorvastatin Calcium) .Marland Kitchen.. 1 By Mouth Once Daily 3)  Tricor 145 Mg  Tabs (Fenofibrate) .Marland Kitchen.. 1 By Mouth Once Daily 4)  Flomax 0.4 Mg  Cp24 (Tamsulosin Hcl) .Marland Kitchen.. 1 By Mouth Once Daily 5)  Multivitamins   Tabs (Multiple Vitamin) .Marland Kitchen.. 1 By Mouth Once Daily 6)  Aspir-Low 81 Mg Tbec (Aspirin) .Marland Kitchen.. 1 By Mouth Once Daily 7)  Temazepam 30 Mg Caps (Temazepam) .Marland Kitchen.. 1 By Mouth At Bedtime As Needed 8)  Dulera 200-5 Mcg/act Aero (Mometasone Furo-Formoterol Fum) .... 2 Puffs Two Times A Day  Allergies (verified): 1)  ! Pcn  Past History:  Past Medical History: Reviewed history from 03/29/2009 and no changes required. GERD Hyperlipidemia Hypertriglyceridemia BPH insomnia normal cardiac stress test 02-23-09 PFTs per Dr. Delford Field on 03-16-09 showing a mild obstructive defect   Past Pulmonary History:  Pulmonary History: Pulmonary Function Test  Date: 03/16/2009 Height (in.): 72 Gender: Male  Pre-Spirometry  FVC     Value: 4.78 L/min   Pred: 4.99 L/min     % Pred: 96 % FEV1     Value: 2.93 L     Pred: 3.54 L     % Pred: 83 %  FEV1/FVC   Value: 61 %     Pred: 71 %     FEF 25-75   Value: 1.41 L/min   Pred: 3.33 L/min     % Pred: 42 %  Post-Spirometry  FVC     Value: 4.80 L/min   Pred: 4.99 L/min     % Pred: 96 % FEV1     Value: 3.15 L     Pred: 3.54 L     % Pred: 89 % FEV1/FVC   Value: 66 %     Pred: 71 %     FEF 25-75   Value: 1.77 L/min   Pred: 3.33 L/min     % Pred: 53 %  Lung Volumes  TLC     Value: 6.88 L   % Pred: 96 % RV     Value: 2.10 L   % Pred: 86 % DLCO     Value: 28.6 %   % Pred: 106 % DLCO/VA   Value: 4.72 %   % Pred: 123 %  Comments:  Moderate peripheral airflow obstruction, normal lung volumes,  elevated DLCO c/w asthma/airway hyperinflation/obstruction Significant reversability with  bronchodilators  Evaluation:  moderate obstruction with significant bronchodilator response  Review of Systems       The patient complains of shortness of breath with activity, acid heartburn, indigestion, and sore throat.  The patient denies shortness of breath at rest, productive cough, non-productive cough, coughing up blood, chest pain, irregular heartbeats, loss of appetite, weight change, abdominal pain, difficulty swallowing, tooth/dental problems, headaches, nasal congestion/difficulty breathing through nose, sneezing, itching, ear ache, anxiety, depression, hand/feet swelling, joint stiffness or pain, rash, change in color of mucus, and fever.    Vital Signs:  Patient profile:   60 year old male Height:      72 inches Weight:      208 pounds BMI:     28.31 O2 Sat:      96 % on Room air Temp:     97.6 degrees F oral Pulse rate:   75 / minute BP sitting:   118 / 78  (right arm) Cuff size:   regular  Vitals Entered By: Gweneth Dimitri RN (August 12, 2009 10:45 AM)  O2 Flow:  Room air CC: 4 mo follow up.  states no changes in breathing.  was placed on dulera 08/05/09 inplace of symbicort-states breathing seems to be better while on dulera but thinks he needs to give it a little longer to see if it is actually working.  Comments Medications reviewed with patient Daytime contact number verified with patient. Gweneth Dimitri RN  August 12, 2009 10:46 AM    Physical Exam  Additional Exam:  Gen: Pleasant, well-nourished, in no distress,  normal affect ENT: No lesions,  mouth clear,  oropharynx clear, no postnasal drip Neck: No JVD, no TMG, no carotid bruits Lungs: No use of accessory muscles, no dullness to percussion, clear without rales or rhonchi, prominent pseudowheeze Cardiovascular: RRR, heart sounds normal, no murmur or gallops, no peripheral edema Abdomen: soft and NT, no HSM,  BS normal Musculoskeletal: No deformities, no cyanosis or clubbing Neuro: alert, non  focal Skin: Warm, no lesions or rashes    Pulmonary Function Test Date: 08/12/2009 11:12 AM Gender: Male  Pre-Spirometry FVC    Value: 4.14 L/min   % Pred: 80.40 % FEV1    Value: 2.86 L     Pred: 3.92 L     % Pred: 72.90 % FEV1/FVC  Value:  69.10 %     % Pred: 90.60 %  Impression & Recommendations:  Problem # 1:  VOCAL CORD DISORDER (ICD-478.5) Assessment Unchanged Chronic episodic dyspnea,  despite mild peripheral airflow obstruction on spirometry, no evidence of change with LABA/ICS.  Exp flow volume loop shows significant upper airway obstruction c/w vocal cord dysfunction  ? if true asthma plan Use peak flow meter two times daily, record results Stop Dulera Use xopenex only as needed 2 puffs every 4-6 hours Use Nexium twice daily for two weeks then once daily Use Nasonex two sprays daily (use sample) Return in 2 weeks with peak flow meter diary pt to record PFR and correlate wtih diary,  PFR today 510 is normal. May consider Methacholine challenge to est or r/o asthma as a dx Orders: Est. Patient Level IV (78295) Spirometry w/Graph (94010)  Medications Added to Medication List This Visit: 1)  Dulera 200-5 Mcg/act Aero (Mometasone furo-formoterol fum) .... 2 puffs two times a day  Complete Medication List: 1)  Nexium 40 Mg Cpdr (Esomeprazole magnesium) .... Once a day one half hour before meal then eat 2)  Lipitor 20 Mg Tabs (Atorvastatin calcium) .Marland Kitchen.. 1 by mouth once daily 3)  Tricor 145 Mg Tabs (Fenofibrate) .Marland Kitchen.. 1 by mouth once daily 4)  Flomax 0.4 Mg Cp24 (Tamsulosin hcl) .Marland Kitchen.. 1 by mouth once daily 5)  Multivitamins Tabs (Multiple vitamin) .Marland Kitchen.. 1 by mouth once daily 6)  Aspir-low 81 Mg Tbec (Aspirin) .Marland Kitchen.. 1 by mouth once daily 7)  Temazepam 30 Mg Caps (Temazepam) .Marland Kitchen.. 1 by mouth at bedtime as needed 8)  Dulera 200-5 Mcg/act Aero (Mometasone furo-formoterol fum) .... 2 puffs two times a day  Patient Instructions: 1)  Use peak flow meter two times daily, record  results 2)  Stop Dulera 3)  Use xopenex only as needed 2 puffs every 4-6 hours 4)  Use Nexium twice daily for two weeks then once daily 5)  Use Nasonex two sprays daily (use sample) 6)  Return in 2 weeks with peak flow meter diary     CardioPerfect Spirometry  ID: [id number] Patient: [name] [name], [name] [name] DOB: May 06, 1951 Age: 60 Years Old Sex: Male Race: White Physician: Nelwyn Salisbury MD Height: 72 Weight: 208 Status: Confirmed Past Medical History:  GERD Hyperlipidemia Hypertriglyceridemia BPH insomnia normal cardiac stress test 02-23-09 PFTs per Dr. Delford Field on 03-16-09 showing a mild obstructive defect  Recorded: 08/12/2009 11:12 AM  Parameter  Measured Predicted %Predicted FVC     4.14        5.14        80.40 FEV1     2.86        3.92        72.90 FEV1%   69.10        76.29        90.60 PEF    8.27        9.81        84.30   Comments: Mild restriction,  no obstruction  Interpretation: Pre: FVC= 4.14L FEV1= 2.86L FEV1%= 69.1% 2.86/4.14 FEV1/FVC (08/12/2009 11:14:42 AM), Mild restriction

## 2010-08-15 NOTE — Procedures (Signed)
Summary: EGD/[facility] [place]  [facility] [place]   Imported By: Sherian Rein 09/06/2009 09:12:46  _____________________________________________________________________  External Attachment:    Type:   Image     Comment:   External Document

## 2010-08-15 NOTE — Letter (Signed)
Summary: Patient Notice-[facility]  [address] [place], [place] [address]   Phone: [phone]  Fax: [phone]        October 28, 2009 MRN: [id number]    [facility] [address] [place], [place]  [address]    Dear [name] [name],  I am pleased to inform you that the biopsies taken during your recent endoscopic examination did not show any evidence of cancer upon pathologic examination.  However, your biopsies indicate you have a condition known as Barrett's esophagus. While not cancer, it is pre-cancerous (can progress to cancer) and needs to be monitored with repeat endoscopic examination and biopsies.  Fortunately, it is quite rare that this develops into cancer, but careful monitoring of the condition along with taking your medication as prescribed is important in reducing the risk of developing cancer.  It is my recommendation that you have a repeat upper gastrointestinal endoscopic examination in 2_ years.  Additional information/recommendations:  _x_Please call [phone] to schedule a return visit to further      evaluate your condition and to discuss results of Your braco probe.  _x_Continue with treatment plan as outlined the day of your exam.  Please call us if you have or develop heartburn, reflux symptoms, any swallowing problems, or if you have questions about your condition that have not been fully answered at this time.  Sincerely,  [name] Carwin MD  This letter has been electronically signed by your physician.  Appended Document: Patient Notice-Barrett's Esopghagus Letter mailed to patient.  Appended Document: Patient Notice-Barrett's Esopghagus Recall is in IDX for 10/2011.

## 2010-08-15 NOTE — Procedures (Signed)
Summary: Instruction for procedure/[facility] (out pt)  Instruction for procedure/[facility] (out pt)   Imported By: Sherian Rein 09/09/2009 07:41:41  _____________________________________________________________________  External Attachment:    Type:   Image     Comment:   External Document

## 2010-08-15 NOTE — Progress Notes (Signed)
Summary: rx  Phone Note Call from Patient Call back at Home Phone [phone] Call back at [phone] - Debra   Caller: Patient Call For: wright Reason for Call: Talk to Nurse Summary of Call: pt on Symbicort and it has been no help.  Did you want to try something else?  Call & speak to pt's wife. Initial call taken by: Eugene Gavia,  August 05, 2009 9:38 AM  Follow-up for Phone Call        Pt wife requested I call pt at his office number. I called and LMTCB at work number. Carron Curie CMA  August 05, 2009 9:44 AM  pt wife called and said pt is c/i continued hoarseness, and sore throat with symbicort. She states he does rinse and gargle with each use, but this has not helped.  Pt also states he feels the symbicort is just not helping control his asthma as well as he would like. Pt asking if ther is another option. Please advise. Carron Curie CMA  August 05, 2009 4:27 PM     Additional Follow-up for Phone Call Additional follow up Details #2::    Leave a sample of Dulera 200 two puff two times a day out front for the patient Pt needs OV with me asap stop the symbicort   Additional Follow-up for Phone Call Additional follow up Details #3:: Details for Additional Follow-up Action Taken: Spoke with pt's spouse and made aware that pt needs to d/c symbicort and take the dulera 200 mg 2 puffs two times a day and rinse mouth well.  Pt's spouse verbalized understanding. She states that she will have pt call to make the appt with PW as she is unsure of when he is able to come in. Additional Follow-up by: Vernie Murders,  August 05, 2009 5:17 PM

## 2010-08-15 NOTE — Assessment & Plan Note (Signed)
Summary: PROCEDURE F/U.Marland KitchenMarland KitchenAS.   History of Present Illness Visit Type: Follow-up Visit Primary GI MD: Lina Sar MD Primary Provider: Gershon Crane, MD  Requesting Provider: n/a Chief Complaint: F/u from endo. Pt c/o heartburn  History of Present Illness:   This is a 60 year old white male who has had severe gastroesophageal reflux for several years  and was diagnosed with  Barrett's esophagus. Esophageal biopsies from an upper endosccopy in December 2004 showed chronic inflammation but no definite evidence of Barrett's. A repeat upper endoscopy last month confirmed the presence of Barrett's esophagus. He has had occasional cough and occasional symptoms of reflux while doing yard work or doing sit ups. He denies any dysphagia or odynophagia. Nexium 40 mg twice a day seems to control his symptoms about 80-90% of the time. A 72 hour bravo pH monitoring study on Nexium 40 mg daily was strongly positive. Patient is interested in a Nissen fundoplication.   GI Review of Systems    Reports heartburn.      Denies abdominal pain, acid reflux, belching, bloating, chest pain, dysphagia with liquids, dysphagia with solids, loss of appetite, nausea, vomiting, vomiting blood, weight loss, and  weight gain.        Denies anal fissure, black tarry stools, change in bowel habit, constipation, diarrhea, diverticulosis, fecal incontinence, heme positive stool, hemorrhoids, irritable bowel syndrome, jaundice, light color stool, liver problems, rectal bleeding, and  rectal pain.    Current Medications (verified): 1)  Nexium 40 Mg  Cpdr (Esomeprazole Magnesium) .... Take Twice Daily  One Half Hour Before Meal Then Eat 2)  Lipitor 20 Mg  Tabs (Atorvastatin Calcium) .Marland Kitchen.. 1 By Mouth Once Daily 3)  Tricor 145 Mg  Tabs (Fenofibrate) .Marland Kitchen.. 1 By Mouth Once Daily 4)  Flomax 0.4 Mg  Cp24 (Tamsulosin Hcl) .Marland Kitchen.. 1 By Mouth Once Daily 5)  Multivitamins   Tabs (Multiple Vitamin) .Marland Kitchen.. 1 By Mouth Once Daily 6)  Aspir-Low 81 Mg  Tbec (Aspirin) .Marland Kitchen.. 1 By Mouth Once Daily 7)  Temazepam 30 Mg Caps (Temazepam) .Marland Kitchen.. 1 By Mouth At Bedtime As Needed  Allergies (verified): 1)  ! Pcn  Past History:  Past Medical History: Reviewed history from 09/09/2009 and no changes required. GERD, sees Dr. 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 Shan Levans  Past Surgical History: Reviewed history from 09/09/2009 and no changes required. Cholecystectomy Tonsillectomy EGD, in 2004 showed GERD only colonoscopy 12-04, showed diverticulosis only, repeat 10 yrs inguinal Hernia Repair  Family History: Reviewed history from 09/01/2009 and no changes required. Family History of Arthritis-RA mother Family History High cholesterol Family History Hypertension Family History Lung cancer Family History of Esophageal Cancer: Father, 2 Aunts No FH of Colon Cancer:  Social History: Reviewed history from 03/02/2009 and no changes required. 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 Just moved to [place] 42yrs ago,  moved from [place] 49yrs then Sales promotion account executive for three years in [employer] Married Never Smoked Alcohol use-yes 2 daughters  Review of Systems  The patient denies allergy/sinus, anemia, anxiety-new, arthritis/joint pain, back pain, blood in urine, breast changes/lumps, change in vision, confusion, cough, coughing up blood, depression-new, fainting, fatigue, fever, headaches-new, hearing problems, heart murmur, heart rhythm changes, itching, menstrual pain, muscle pains/cramps, night sweats, nosebleeds, pregnancy symptoms, shortness of breath, skin rash, sleeping problems, sore throat, swelling of feet/legs, swollen lymph  glands, thirst - excessive , urination - excessive , urination changes/pain, urine leakage, vision changes, and  voice change.         Pertinent positive and negative review of systems were noted in the above HPI. All other ROS was otherwise negative.   Vital Signs:  Patient profile:   60 year old male Height:      72 inches Weight:      211 pounds BMI:     28.72 BSA:     2.18 Pulse rate:   96 / minute Pulse rhythm:   regular BP sitting:   132 / 84  (left arm) Cuff size:   regular  Vitals Entered By: Ok Anis CMA (Dec 13, 2009 10:58 AM)   Impression & Recommendations:  Problem # 1:  BARRETTS ESOPHAGUS (ICD-530.85)  Barrett's esophagus was diagnosed on a recent upper endoscopy. He will need to be monitored closely with a repeat upper endoscopy in 2 years. Because of his long-standing reflux, he would be a good candidate for Nissen fundoplication.  Orders: Barium Swallow (Barium Swallow)  Problem # 2:  GERD (ICD-530.81)  Patient is currently on Nexium 40 mg twice a day and antireflux measures. He is only 80-90% controlled. We will add Zantac 150 mg in the middle of the day. We will make an appointment for him to see Dr. Daphine Deutscher for consideration of Nissen fundoplication. We will schedule a barium esophagram. We discussed the possibility of an esophageal manometry. At this point, he prefers to do a barium esophagram .  Orders: [facility] Surger) UGI Series (UGI Series) Barium Swallow (Barium Swallow)  Patient Instructions: 1)  appointment with Dr. Luretha Murphy for discussion of Nissen fundoplication. 2)  Continue antireflux measures. 3)  Continue Nexium 40 mg p.o. b.i.d. 4)  Add Zantac 150 mg p.o. in the middle of the day. 5)  Consider adding Reglan p.r.n. 6)  Barium esophagram scheduled. 7)  Copy sent to : Dr Kathie Rhodes.Fry 8)  The medication list was reviewed and reconciled.  All changed / newly prescribed medications were explained.  A complete medication list was provided to the patient / caregiver. Prescriptions: ZANTAC 150 MG TABS (RANITIDINE HCL) Take 1  tablet by mouth midday  #30 x 3   Entered by:   Lamona Curl CMA (AAMA)   Authorized by:   Hart Carwin MD   Signed by:   Lamona Curl CMA (AAMA) on 12/13/2009   Method used:   [facility]        [facility]  [address] [id number]       [address]       [place], [place]  [address]       Ph: [phone] or [phone]       Fax: [phone]   RxID:   [id number]

## 2010-08-15 NOTE — Op Note (Signed)
Summary: [facility] MONITORING    NAMETYLEK, BONEY           ACCOUNT NO.:  [id number]      MEDICAL RECORD NO.:  [id number]          PATIENT TYPE:  AMB      LOCATION:  ENDO                         FACILITY:  [facility]      PHYSICIAN:  Hedwig Morton. Juanda Chance, MD     DATE OF BIRTH:  08/16/1950      DATE OF CONSULTATION:   DATE OF DISCHARGE:  10/27/2009                                    CONSULTATION      PROCEDURE:  BRAVO pH monitoring study.      INDICATIONS:  This 60 year old white male has a history of severe   gastroesophageal reflux and had Barrett esophagus on recent upper   endoscopy.  He has been on Nexium 40 mg once a day but has noticed   breakthrough symptoms.  He is being evaluated for Nissen fundoplication.      RESULTS:  The duration of the probe recordings was 20 hours and 52   minutes and the patient had a total of 70 reflux episodes, 46 of them in   upright position, 26 in supine and 63 postprandially.  On the first day,   the longest reflux episode lasted 16 minutes and occurred in supine   position postprandially.  Number of long reflux episodes lasting over 5   minutes occurred 5 postprandially, 4 in upright position and 6 totally.   The time pH was in less than 4, was 107 minutes, 1 minute in   postprandial state.  The fraction time pH was less than 4, was 8.9% in   upright position, 8.1% supine position and 12.6% postprandially.  The   total DeMeester score for acid reflux analysis on day #1 was 32.6 which   is abnormally high.      Acid reflux monitoring on day #2 showed similar recordings of pH of less   than 4, total of 165 minutes; 130 of them were in postprandial state.   The fraction time in less than 4 occurred 18.4% of the time in   postprandial condition and 12.0 time totally.      IMPRESSION:  This is a strongly positive BRAVO pH monitoring study   indicating significant gastroesophageal reflux on day #1 and day #2   while the patient was taking Nexium 40  mg daily.               Hedwig Morton. Juanda Chance, MD            DMB/MEDQ  D:  11/15/2009  T:  11/15/2009  Job:  [id number]      Electronically Signed by Lina Sar MD on 11/15/2009 10:48:20 PM

## 2010-08-17 NOTE — Letter (Signed)
Summary: [facility]   Imported By: Lennie Odor 07/26/2010 15:44:29  _____________________________________________________________________  External Attachment:    Type:   Image     Comment:   External Document

## 2010-09-14 ENCOUNTER — Ambulatory Visit (INDEPENDENT_AMBULATORY_CARE_PROVIDER_SITE_OTHER): Payer: Federal, State, Local not specified - PPO | Admitting: Family Medicine

## 2010-09-14 ENCOUNTER — Encounter: Payer: Self-pay | Admitting: Family Medicine

## 2010-09-14 VITALS — BP 118/80 | HR 94 | Temp 98.7°F | Ht 71.5 in | Wt 206.0 lb

## 2010-09-14 DIAGNOSIS — J40 Bronchitis, not specified as acute or chronic: Secondary | ICD-10-CM

## 2010-09-14 MED ORDER — HYDROCODONE-HOMATROPINE 5-1.5 MG/5ML PO SYRP: 5.0000 mL | ORAL_SOLUTION | ORAL | Status: AC | PRN

## 2010-09-14 MED ORDER — DOXYCYCLINE HYCLATE 100 MG PO CAPS: 100.0000 mg | ORAL_CAPSULE | Freq: Two times a day (BID) | ORAL | Status: AC

## 2010-09-14 NOTE — Progress Notes (Signed)
  Subjective:    Patient ID: [name] [name], male    DOB: 27-Mar-1951, 60 y.o.   MRN: [id number]  HPI Here for 4 weeks of intermittent chest congestion and a dry cough. No fever.    Review of Systems  Constitutional: Negative.   HENT: Positive for congestion and sinus pressure.   Eyes: Negative.   Respiratory: Positive for cough. Negative for shortness of breath and wheezing.   Cardiovascular: Negative.        Objective:   Physical Exam  Constitutional: He appears well-developed and well-nourished.  HENT:  Head: Normocephalic and atraumatic.  Right Ear: External ear normal.  Left Ear: External ear normal.  Nose: Nose normal.  Mouth/Throat: Oropharynx is clear and moist.  Eyes: Conjunctivae are normal. Pupils are equal, round, and reactive to light.  Pulmonary/Chest: Effort normal and breath sounds normal. No respiratory distress. He has no wheezes. He has no rales. He exhibits no tenderness.  Lymphadenopathy:    He has no cervical adenopathy.          Assessment & Plan:  URI

## 2010-09-27 ENCOUNTER — Other Ambulatory Visit: Payer: Self-pay | Admitting: Surgery

## 2010-09-29 LAB — CBC
HCT: 36.3 % — ABNORMAL LOW (ref 39.0–52.0)
Hemoglobin: 12.8 g/dL — ABNORMAL LOW (ref 13.0–17.0)
MCHC: 35.4 g/dL (ref 30.0–36.0)
MCV: 91.7 fL (ref 78.0–100.0)
RDW: 13 % (ref 11.5–15.5)
WBC: 9 10*3/uL (ref 4.0–10.5)

## 2010-09-29 LAB — DIFFERENTIAL
Basophils Relative: 0 % (ref 0–1)
Eosinophils Absolute: 0 10*3/uL (ref 0.0–0.7)
Lymphs Abs: 0.9 10*3/uL (ref 0.7–4.0)
Monocytes Absolute: 0.6 10*3/uL (ref 0.1–1.0)
Monocytes Relative: 6 % (ref 3–12)
Neutrophils Relative %: 83 % — ABNORMAL HIGH (ref 43–77)

## 2010-09-29 LAB — SURGICAL PCR SCREEN: MRSA, PCR: NEGATIVE

## 2010-10-03 ENCOUNTER — Ambulatory Visit
Admission: RE | Admit: 2010-10-03 | Discharge: 2010-10-03 | Disposition: A | Discharge status: Home / Self Care | Payer: 59 | Source: Ambulatory Visit | Attending: Surgery | Admitting: Surgery

## 2010-10-09 ENCOUNTER — Other Ambulatory Visit: Payer: Self-pay

## 2010-10-09 MED ORDER — TEMAZEPAM 30 MG PO CAPS: 30.0000 mg | ORAL_CAPSULE | Freq: Every evening | ORAL | Status: DC | PRN

## 2010-10-09 NOTE — Telephone Encounter (Signed)
Refill temazepam 30 mg

## 2010-10-09 NOTE — Telephone Encounter (Signed)
rx temazepam called to [facility] [address]

## 2010-10-09 NOTE — Telephone Encounter (Signed)
Call in #30 with 5 rf 

## 2010-10-09 NOTE — Telephone Encounter (Signed)
Called rx [facility] [address]

## 2010-11-16 ENCOUNTER — Telehealth: Payer: Self-pay | Admitting: *Deleted

## 2010-11-16 NOTE — Telephone Encounter (Signed)
Pt's wife is calling stating husband has developed another URI that is going into his chest and starting with another bout of bronchitis.  Would like to have [facility] [address] [place].

## 2010-11-17 MED ORDER — AZITHROMYCIN 250 MG PO TABS: ORAL_TABLET | ORAL | Status: AC

## 2010-11-17 NOTE — Telephone Encounter (Signed)
Call in a Zpack  ?

## 2010-11-28 NOTE — Assessment & Plan Note (Signed)
[facility]                         GASTROENTEROLOGY OFFICE NOTE   NAME:[name], [name]                    MRN:          [id number]  DATE:03/19/2007                            DOB:          1950/09/21    HISTORY OF PRESENT ILLNESS:  [name] [name] is a very nice 60 year old  white male patient of Dr. Clent Ridges who is here today to discuss follow up  colonoscopy and endoscopy.  He brought with [facility] [place], Dr. Lilly Cove, where he had upper endoscopy and  colonoscopy in December 2004 with findings of reflux esophagitis.  Barrett's esophagus was considered in the diagnosis, and biopsies were  obtained, but I do not have the report from the esophageal biopsy.  The  patient was told that everything was alright, which indicated to me that  he did not have Barrett's esophagus.  The patient had a normal  colonoscopy except for diverticulosis of the left colon and was asked to  have repeat colonoscopy in five years, but he has no family history of  colon cancer and he has never had any polyps.  During the last  Christmas, he developed acute vomiting for about 24 hours, most likely  related to some food intake.  During the vomiting, he developed severe  heartburn which persisted for about 4-6 weeks.  Dr. Clent Ridges increased his  Nexium from 40 mg daily to 40 mg b.i.d. with complete resolution of his  symptoms.  He currently denies any dysphagia, odynophagia or heartburn.  His father had esophageal cancer.  Past history  includes high  cholesterol, cholecystectomy, hernia repair.  Upper and colonoscopy were  complicated by the fact that he could not be sedated properly.  He also  woke up during his gallbladder surgery and hernia surgery.  He is quite  concerned about the possibility of having conscious sedation again.   FAMILY HISTORY:  Positive for esophageal CA in his father.  No family  history of colon cancer.   SOCIAL HISTORY:  Married  with two children.  He works as a Environmental consultant.  He does not smoke or drink alcohol.   REVIEW OF SYSTEMS:  Positive for eye glasses without specific  complaints.   PHYSICAL EXAMINATION:  VITAL SIGNS:  Blood pressure 118/70, pulse 80,  weight 206 pounds.  He was alert, oriented, in no distress, healthy  appearing.  LUNGS:  Clear to auscultation.  COR:  Normal S1, S2.  ABDOMEN:  Soft, muscular with normoactive bowel sounds.  Nontender.  RECTAL:  Deferred.  The patient just had a complete physical examination  by Dr.  Clent Ridges and told to have a rectal exam.  EXTREMITIES:  No edema.   IMPRESSION:  A 60 year old white male who has no specific GI symptoms  other than a gastroesophageal reflux disease.  Barrett's esophagus was  apparently ruled out on previous endoscopy in 2004.  He is a very low  risk for colon cancer and should be on a every 10-year recall schedule.   PLAN:  1. Obtain pathology report from esophageal biopsy from his upper  endoscopy in December 2004 in [place].  Based on this biopsy, we      will decide if he needs to have a repeat endoscopy.  2. Continue Nexium as per Dr. Clent Ridges.  3. Recall colonoscopy in 10 years from December 2004.  This would be      in December 2014, unless he develops specific symptoms.  4. Endoscopy/colonoscopy.  Consider use of propofol because of      difficult sedation.  I discussed it with the patient and he agrees      with the plan.     Hedwig Morton. Juanda Chance, MD  Electronically Signed    DMB/MedQ  DD: 03/19/2007  DT: 03/19/2007  Job #: [id number]   cc:   Tera Mater. Clent Ridges, MD

## 2010-12-01 NOTE — Assessment & Plan Note (Signed)
[facility] OFFICE NOTE   NAME:SPEEDLINGWaino, Mounsey                    MRN:          [id number]  DATE:01/23/2006                            DOB:          February 29, 1951    This is a 60 year old gentleman here for a complete physical examination.  He has a couple of things to discuss.  We have been treating him for mild  prostatic enlargement over the past year with Flomax.  He says it has helped  his symptoms some but would like to try something stronger if possible.  He  still has nocturia two to three times at night and some mild urgency and  frequency during the day.  There is still no discomfort involved.  He has  had chronic pain in the right shoulder for some time. He has made an  appointment to see Dr. Rennis Chris on August 22 for this problem.  He continues  to exercise on a daily basis.  He continues to watch his diet closely.  Further details of his past medical history, family history, social history,  habits, etc., refer to our last physical note dated January 17, 2005.   ALLERGIES:  none.   CURRENT MEDICATIONS:  1.  Nexium 40 mg per day.  2.  Lipitor 20 mg per day.  3.  Tricor 145 mg per day.  4.  Flomax 0.4 mg per day.   OBJECTIVE:  VITAL SIGNS:  Height 5 feet 11 inches, weight 212, blood  pressure 140/80, pulse 70 and regular.  GENERAL:  He appears quite healthy.  SKIN: Free of significant lesions.  EYES:  Sclerae clear.  PHARYNX:  Clear.  NECK:  Supple without lymphadenopathy, masses.  LUNGS:  Clear.  CARDIAC:  Rate and rhythm regular without gallops, murmurs, rubs.  Distal  pulses are full.  EKG is within normal limits.  ABDOMEN:  Soft.  Normal bowel sounds.  Nontender.  No masses.  GENITALIA:  Normal male.  He is circumcised.  RECTAL:  No masses or tenderness.  Prostate is mildly enlarged but smooth.  Stool is hemoccult negative.  EXTREMITIES:  No clubbing, cyanosis or edema.  NEUROLOGIC:  Grossly  intact.   He was here for fasting labs on July 3.  These were all within normal limits  including a PSA of 0.78 and LDL of 87 and triglycerides of 116.   ASSESSMENT/PLAN:  Problem #1.  Complete physical.  He is overdue for a  tetanus booster so he was given DT today.  He will continue his exercise  regimen.  Problem #2.  Shoulder pain.  He will see Dr. Rennis Chris as above.  Problem #3.  Gastroesophageal reflux disease, stable.  Problem #4.  Hyperlipidemia, stable.  Problem #5.  Benign prostatic hypertrophy.  Will continue Flomax daily but  add Avodart 0.5 mg once a day as well.                                   Tera Mater. Clent Ridges, MD   SAF/MedQ  DD:  01/23/2006  DT:  01/23/2006  Job #:  [id number]

## 2011-02-12 ENCOUNTER — Encounter: Payer: Self-pay | Admitting: Family Medicine

## 2011-02-14 ENCOUNTER — Telehealth: Payer: Self-pay | Admitting: Family Medicine

## 2011-03-06 ENCOUNTER — Telehealth: Payer: Self-pay | Admitting: Family Medicine

## 2011-03-06 NOTE — Telephone Encounter (Signed)
Pt is having pain in his colon mostly when he sits down. Pt would like to know if it would be best to see Dr. Clent Ridges or Dr. Dickie La. Please contact pt.

## 2011-03-06 NOTE — Telephone Encounter (Signed)
Closed

## 2011-03-12 NOTE — Telephone Encounter (Signed)
See me first, since it could be his prostate or other things. We can refer if necessary

## 2011-03-12 NOTE — Telephone Encounter (Signed)
Spoke with pt and he is feeling better. He will schedule a office visit if he starts to feel any worse.

## 2011-04-11 ENCOUNTER — Other Ambulatory Visit (INDEPENDENT_AMBULATORY_CARE_PROVIDER_SITE_OTHER): Payer: 59

## 2011-04-11 ENCOUNTER — Telehealth: Payer: Self-pay | Admitting: Family Medicine

## 2011-04-11 DIAGNOSIS — Z0000 Encounter for general adult medical examination without abnormal findings: Secondary | ICD-10-CM

## 2011-04-11 LAB — BASIC METABOLIC PANEL
BUN: 20 mg/dL (ref 6–23)
CO2: 30 mEq/L (ref 19–32)
Calcium: 9.3 mg/dL (ref 8.4–10.5)
Creatinine, Ser: 0.8 mg/dL (ref 0.4–1.5)
Glucose, Bld: 116 mg/dL — ABNORMAL HIGH (ref 70–99)

## 2011-04-11 LAB — CBC WITH DIFFERENTIAL/PLATELET
Basophils Absolute: 0 10*3/uL (ref 0.0–0.1)
Eosinophils Absolute: 0.1 10*3/uL (ref 0.0–0.7)
Lymphocytes Relative: 25.5 % (ref 12.0–46.0)
MCHC: 34.2 g/dL (ref 30.0–36.0)
Neutro Abs: 3.7 10*3/uL (ref 1.4–7.7)
Neutrophils Relative %: 63.5 % (ref 43.0–77.0)
Platelets: 262 10*3/uL (ref 150.0–400.0)
RDW: 13.4 % (ref 11.5–14.6)

## 2011-04-11 LAB — POCT URINALYSIS DIPSTICK
Ketones, UA: NEGATIVE
Leukocytes, UA: NEGATIVE
Protein, UA: NEGATIVE
pH, UA: 6.5

## 2011-04-11 LAB — LIPID PANEL
HDL: 44.4 mg/dL (ref >39.00)
VLDL: 28.8 mg/dL (ref 0.0–40.0)

## 2011-04-11 LAB — HEPATIC FUNCTION PANEL
Albumin: 4.3 g/dL (ref 3.5–5.2)
Alkaline Phosphatase: 40 U/L (ref 39–117)
Bilirubin, Direct: 0.2 mg/dL (ref 0.0–0.3)

## 2011-04-11 NOTE — Telephone Encounter (Signed)
Refill request for Temazepam 30 mg take 1 po qhs prn. Pt last here on 09/14/10 and script last filled on 03/09/11.

## 2011-04-12 ENCOUNTER — Other Ambulatory Visit: Payer: Self-pay | Admitting: *Deleted

## 2011-04-12 NOTE — Telephone Encounter (Signed)
Refill on temazepam 30mg  last filled on 03/09/11 #30

## 2011-04-13 MED ORDER — TEMAZEPAM 30 MG PO CAPS: 30.0000 mg | ORAL_CAPSULE | Freq: Every evening | ORAL | Status: DC | PRN

## 2011-04-13 NOTE — Telephone Encounter (Signed)
rx called into pharmacy

## 2011-04-13 NOTE — Telephone Encounter (Signed)
Already done

## 2011-04-13 NOTE — Telephone Encounter (Signed)
Call in #30 with 5 rf 

## 2011-04-18 NOTE — Progress Notes (Signed)
Quick Note:  Pt aware ______ 

## 2011-04-20 ENCOUNTER — Encounter: Payer: Self-pay | Admitting: Family Medicine

## 2011-04-20 ENCOUNTER — Ambulatory Visit (INDEPENDENT_AMBULATORY_CARE_PROVIDER_SITE_OTHER): Payer: Federal, State, Local not specified - PPO | Admitting: Family Medicine

## 2011-04-20 VITALS — BP 126/84 | HR 92 | Temp 98.5°F | Ht 71.75 in | Wt 208.0 lb

## 2011-04-20 DIAGNOSIS — Z0000 Encounter for general adult medical examination without abnormal findings: Secondary | ICD-10-CM

## 2011-04-20 MED ORDER — ATORVASTATIN CALCIUM 20 MG PO TABS: 20.0000 mg | ORAL_TABLET | Freq: Every day | ORAL | Status: DC

## 2011-04-20 MED ORDER — FENOFIBRATE 160 MG PO TABS: 160.0000 mg | ORAL_TABLET | Freq: Every day | ORAL | Status: DC

## 2011-04-20 MED ORDER — TAMSULOSIN HCL 0.4 MG PO CAPS: 0.4000 mg | ORAL_CAPSULE | Freq: Every day | ORAL | Status: DC

## 2011-04-20 MED ORDER — TEMAZEPAM 30 MG PO CAPS: 30.0000 mg | ORAL_CAPSULE | Freq: Every evening | ORAL | Status: DC | PRN

## 2011-04-20 NOTE — Progress Notes (Signed)
  Subjective:    Patient ID: [name] [name], male    DOB: [birth year], 60 y.o.   MRN: [id number]  HPI 60 yr old male for a cpx. He feels great and has no concerns. He retired form the [employer] this week, and he is enjoying his free time. He is mulling over several job offers to do something else now.    Review of Systems  Constitutional: Negative.   HENT: Negative.   Eyes: Negative.   Respiratory: Negative.   Cardiovascular: Negative.   Gastrointestinal: Negative.   Genitourinary: Negative.   Musculoskeletal: Negative.   Skin: Negative.   Neurological: Negative.   Hematological: Negative.   Psychiatric/Behavioral: Negative.        Objective:   Physical Exam  Constitutional: He is oriented to person, place, and time. He appears well-developed and well-nourished. No distress.  HENT:  Head: Normocephalic and atraumatic.  Right Ear: External ear normal.  Left Ear: External ear normal.  Nose: Nose normal.  Mouth/Throat: Oropharynx is clear and moist. No oropharyngeal exudate.  Eyes: Conjunctivae and EOM are normal. Pupils are equal, round, and reactive to light. Right eye exhibits no discharge. Left eye exhibits no discharge. No scleral icterus.  Neck: Neck supple. No JVD present. No tracheal deviation present. No thyromegaly present.  Cardiovascular: Normal rate, regular rhythm, normal heart sounds and intact distal pulses.  Exam reveals no gallop and no friction rub.   No murmur heard.      EKG normal   Pulmonary/Chest: Effort normal and breath sounds normal. No respiratory distress. He has no wheezes. He has no rales. He exhibits no tenderness.  Abdominal: Soft. Bowel sounds are normal. He exhibits no distension and no mass. There is no tenderness. There is no rebound and no guarding.  Genitourinary: Rectum normal and penis normal. Guaiac negative stool. No penile tenderness.       Prostate is moderately enlarged but smooth and non-tender   Musculoskeletal: Normal  range of motion. He exhibits no edema and no tenderness.  Lymphadenopathy:    He has no cervical adenopathy.  Neurological: He is alert and oriented to person, place, and time. He has normal reflexes. No cranial nerve deficit. He exhibits normal muscle tone. Coordination normal.  Skin: Skin is warm and dry. No rash noted. He is not diaphoretic. No erythema. No pallor.  Psychiatric: He has a normal mood and affect. His behavior is normal. Judgment and thought content normal.          Assessment & Plan:  Well exam. His PSA has jumped up a point from last year. This could be from BPH but we will repeat another PSA in 6 months to follow this. We will also repeat a fasting glucose in 6 months. He will watch his carb intake and stop drinking sweet drinks.

## 2011-04-26 ENCOUNTER — Telehealth: Payer: Self-pay | Admitting: Family Medicine

## 2011-04-26 MED ORDER — CIPROFLOXACIN HCL 500 MG PO TABS: 500.0000 mg | ORAL_TABLET | Freq: Two times a day (BID) | ORAL | Status: AC

## 2011-04-26 NOTE — Telephone Encounter (Signed)
Call in Cipro 500 mg bid for 14 days  

## 2011-04-26 NOTE — Telephone Encounter (Signed)
Script sent e-scribe 

## 2011-04-26 NOTE — Telephone Encounter (Signed)
Pt called and is having symptoms of UTI. Pt is leaving town tomorrow and is req a Equities trader for Cipro, to be called in to [facility] [address] in [place]. Pls call in today. Pt aware that Dr Clent Ridges is out of office.

## 2011-05-09 ENCOUNTER — Ambulatory Visit (INDEPENDENT_AMBULATORY_CARE_PROVIDER_SITE_OTHER): Payer: Federal, State, Local not specified - PPO | Admitting: Family Medicine

## 2011-05-09 ENCOUNTER — Encounter: Payer: Self-pay | Admitting: Family Medicine

## 2011-05-09 VITALS — BP 132/90 | HR 79 | Temp 98.8°F | Wt 210.0 lb

## 2011-05-09 DIAGNOSIS — N200 Calculus of kidney: Secondary | ICD-10-CM

## 2011-05-09 NOTE — Progress Notes (Signed)
  Subjective:    Patient ID: [name] [name], male    DOB: [birth year], 60 y.o.   MRN: [id number]  HPI Here to discuss kidney stones. He has passed around 7 stones in the past 7 years. He has passed 2 of them in the past week. Yesterday he felt a slight twinge of pain in the bladder area when urinating and passed a stone. He brought this in with him today. No fever or nausea. He used to drink a lot of sodas, but he has started to drink more water.    Review of Systems  Constitutional: Negative.   Gastrointestinal: Negative.   Genitourinary: Positive for difficulty urinating. Negative for urgency, frequency, hematuria, flank pain and testicular pain.       Objective:   Physical Exam  Constitutional: He appears well-developed and well-nourished.  Abdominal: Soft. Bowel sounds are normal. He exhibits no distension. There is no tenderness. There is no rebound and no guarding.  Genitourinary: Penis normal.          Assessment & Plan:  We will send this stone to the lab for an analysis. Strongly advised him to stop sodas and to drink more water daily. Set up a CT to look for any more.

## 2011-05-11 ENCOUNTER — Ambulatory Visit (INDEPENDENT_AMBULATORY_CARE_PROVIDER_SITE_OTHER)
Admission: RE | Admit: 2011-05-11 | Discharge: 2011-05-11 | Disposition: A | Discharge status: Home / Self Care | Payer: Federal, State, Local not specified - PPO | Source: Ambulatory Visit | Attending: Internal Medicine | Admitting: Internal Medicine

## 2011-05-11 ENCOUNTER — Inpatient Hospital Stay: Admission: RE | Admit: 2011-05-11 | Payer: Federal, State, Local not specified - PPO | Source: Ambulatory Visit

## 2011-05-11 DIAGNOSIS — N200 Calculus of kidney: Secondary | ICD-10-CM

## 2011-05-11 NOTE — Progress Notes (Signed)
Addended by: Gershon Crane A on: 05/11/2011 08:38 AM   Modules accepted: Orders

## 2011-05-11 NOTE — Progress Notes (Signed)
Addended by: Gershon Crane A on: 05/11/2011 08:14 AM   Modules accepted: Orders

## 2011-05-15 LAB — STONE ANALYSIS: Stone Weight KSTONE: 0.035 g

## 2011-05-16 ENCOUNTER — Telehealth: Payer: Self-pay | Admitting: Family Medicine

## 2011-05-16 NOTE — Telephone Encounter (Signed)
Spoke with pt and gave results. 

## 2011-05-16 NOTE — Telephone Encounter (Signed)
Message copied by Baldemar Friday on Wed May 16, 2011  5:21 PM ------      Message from: Gershon Crane A      Created: Mon May 14, 2011  6:02 AM       Shows several tiny (1-2 mm) stones in both kidneys but nothing of concern. He should be able to pass all of these over time. Continue to drink plenty of water every day

## 2011-05-21 ENCOUNTER — Telehealth: Payer: Self-pay | Admitting: Family Medicine

## 2011-05-21 NOTE — Telephone Encounter (Signed)
I called pt to give results and he has several more questions. 1. How many more can he be expected to pass, results stated several? 2. How is this size compared to the last one he passed? 3. Is there a medication to help prevent the stones from developing again?

## 2011-05-22 ENCOUNTER — Telehealth: Payer: Self-pay | Admitting: Family Medicine

## 2011-05-22 NOTE — Telephone Encounter (Signed)
The radiologist was not specific on the number of stones seen. He mentioned 5 on the left side and "clustered" stones on the right. All of these measured only 1-2 mm which is much smaller than the stone he passed. As for a med to take to prevent them, I do not know of any. Just drink lots of water and avoid a lot of calcium.

## 2011-05-22 NOTE — Telephone Encounter (Signed)
Message copied by Baldemar Friday on Tue May 22, 2011  1:57 PM ------      Message from: Gershon Crane A      Created: Thu May 17, 2011  3:29 PM       His stone is composed mostly of calcium. Tell him to remove as much calcium from his diet as he can and to drink plenty of water

## 2011-05-22 NOTE — Telephone Encounter (Signed)
Spoke with pt and gave results. 

## 2011-05-23 NOTE — Telephone Encounter (Signed)
Call back this afternoon at home #.

## 2011-05-28 NOTE — Telephone Encounter (Signed)
Spoke with pt

## 2011-07-17 DIAGNOSIS — A6920 Lyme disease, unspecified: Secondary | ICD-10-CM

## 2011-07-17 HISTORY — DX: Lyme disease, unspecified: A69.20

## 2011-08-06 ENCOUNTER — Telehealth: Payer: Self-pay | Admitting: Family Medicine

## 2011-08-06 MED ORDER — SCOPOLAMINE 1 MG/3DAYS TD PT72: 1.0000 | MEDICATED_PATCH | TRANSDERMAL | Status: DC

## 2011-08-06 NOTE — Telephone Encounter (Signed)
His eye doctor told him to stop taking aspirin because he was recently found to have macular degeneration. Also he needs motion sickness meds for a cruise to [place] this summer. We will stop the aspirin.

## 2011-08-07 IMAGING — RF DG ESOPHAGUS
6 of 7 series · 20 of 24 positions shown · non-contrast
Comparison: 12/21/2009

CLINICAL DATA: Barrett's esophagus.  Nissen fundoplication
yesterday.

ESOPHOGRAM/BARIUM SWALLOW
TECHNIQUE: Single contrast examination was performed using
[id number].
Fluoroscopy time:  1.6 minutes.

[Series 1: run · 15 of 33 slices shown (1 of 6)]
[im 1/33]
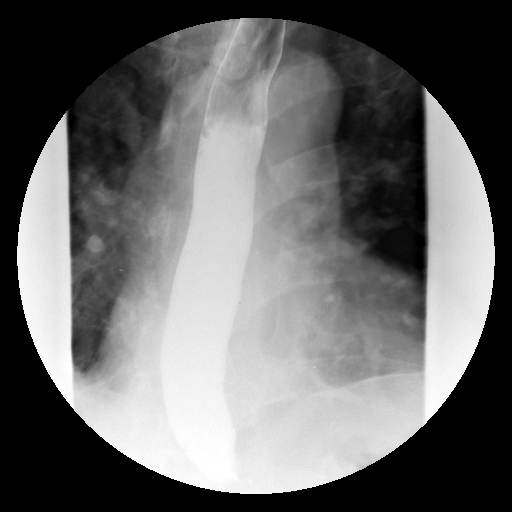
[im 2/33]
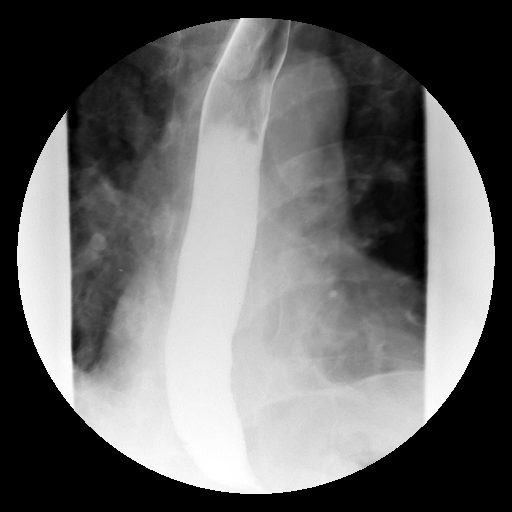
[im 6/33]
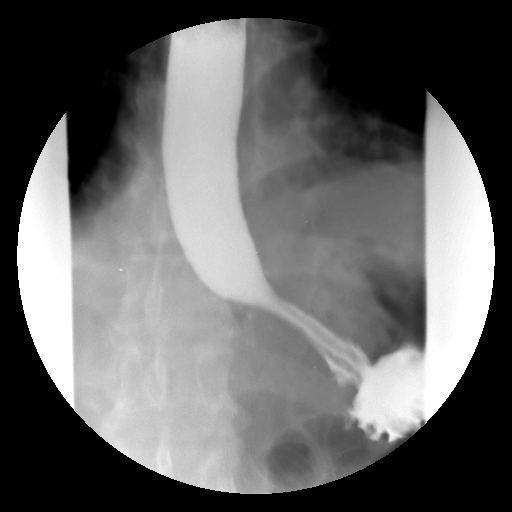
[im 8/33]
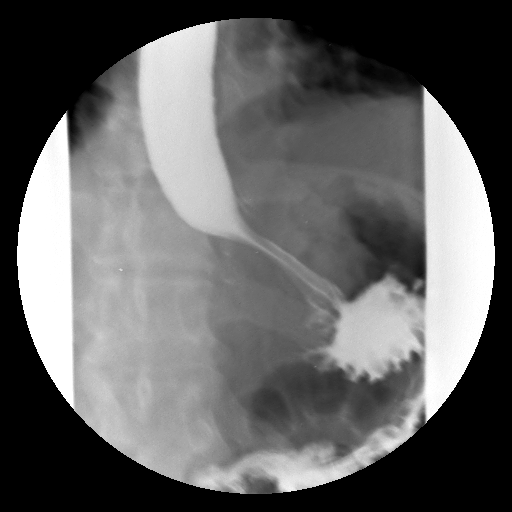
[im 10/33]
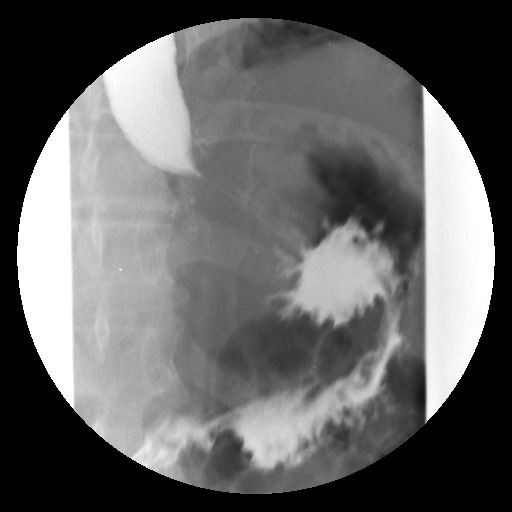
[im 12/33]
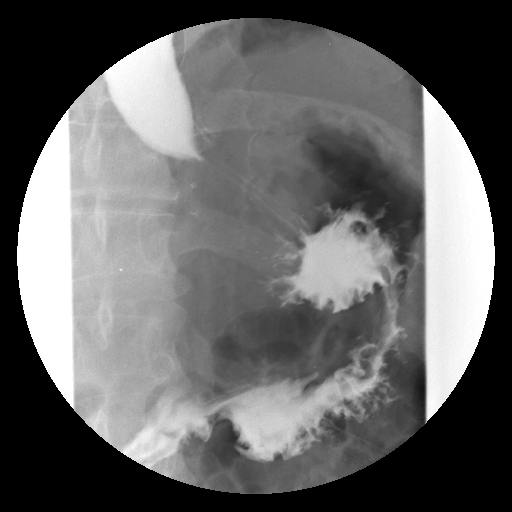
[im 14/33]
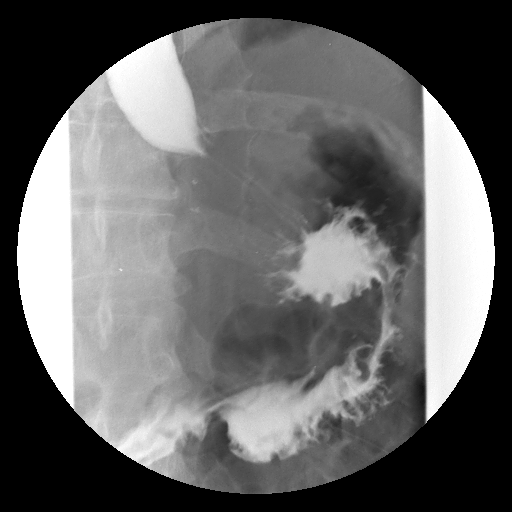
[im 17/33]
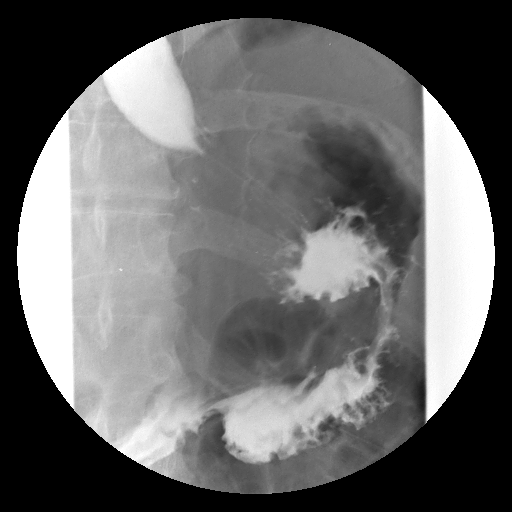
[im 19/33]
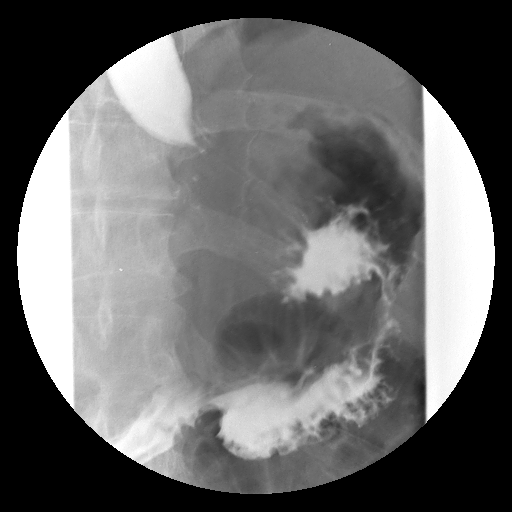
[im 21/33]
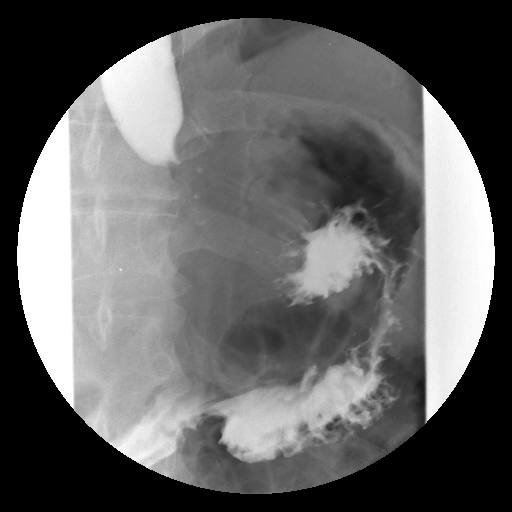
[im 23/33]
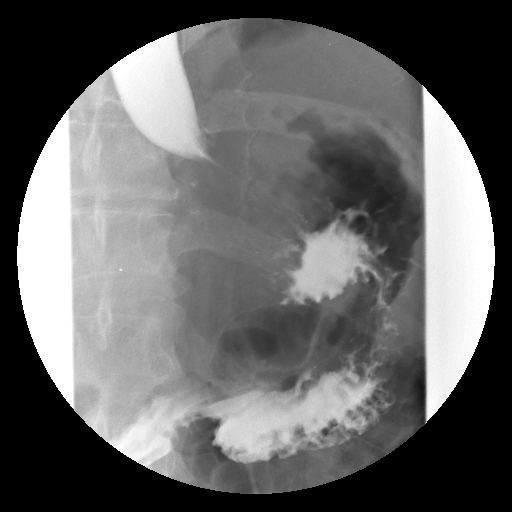
[im 25/33]
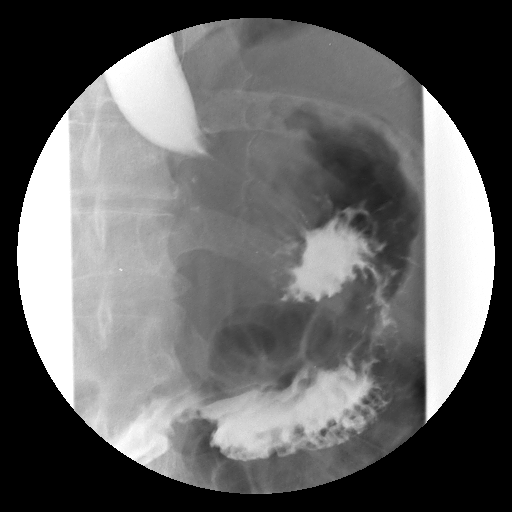
[im 29/33]
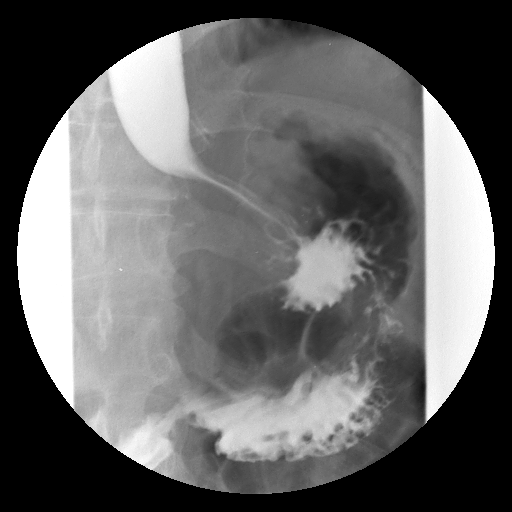
[im 31/33]
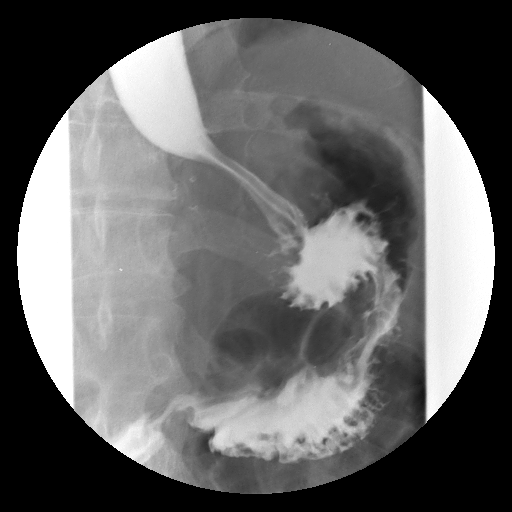
[im 33/33]
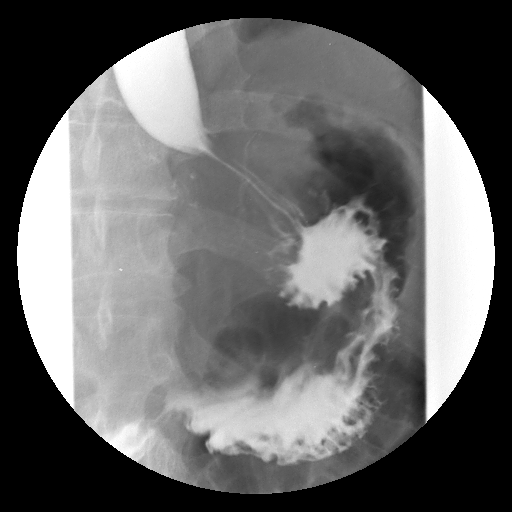

[Series 2: run · 1 of 1 slices shown (2 of 6)]
[im 1/1]
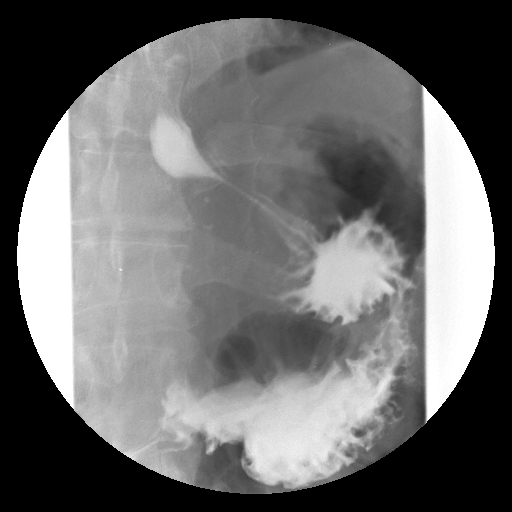

[Series 3: run · 1 of 1 slices shown (3 of 6)]
[im 1/1]
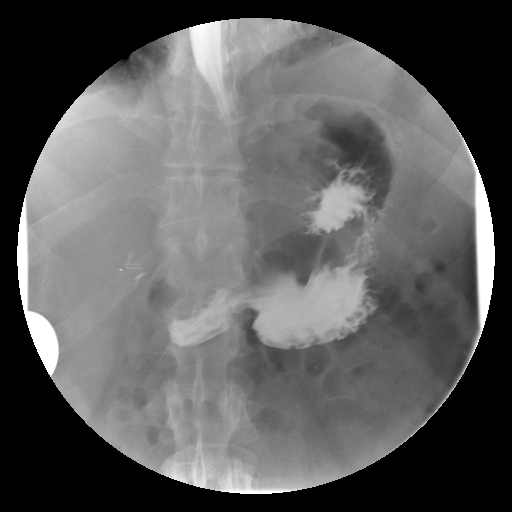

[Series 5: run · 1 of 1 slices shown (4 of 6)]
[im 1/1]
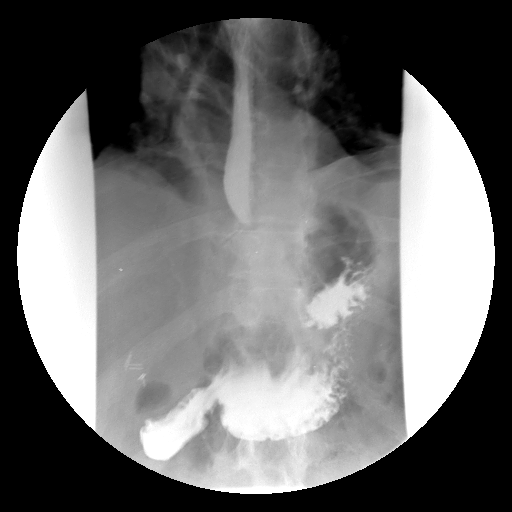

[Series 6: run · 1 of 1 slices shown (5 of 6)]
[im 1/1]
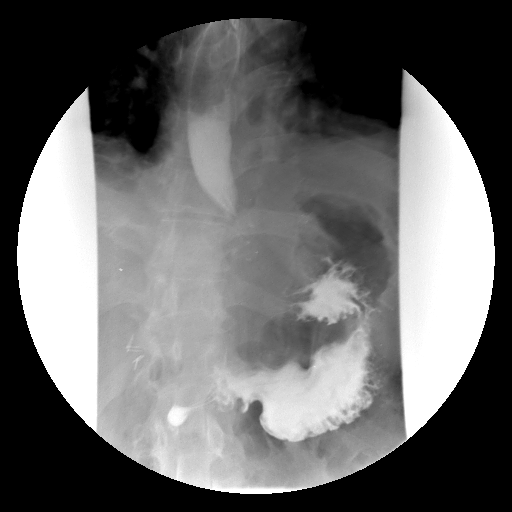

[Series 7: run · 1 of 1 slices shown (6 of 6)]
[im 1/1]
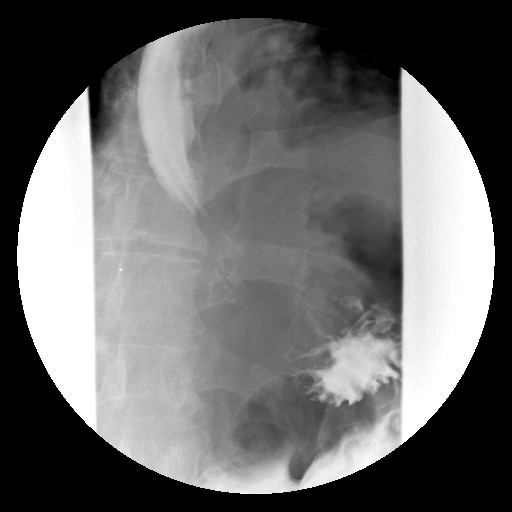

[20 of 24 positions shown; findings below may reference images not displayed]

FINDINGS: Distal esophagus has normal mucosa.  There is some edema
around the fundus of  the stomach due to recent Nissen
fundoplication.     There is no leak.  No acute complication is
identified.  The stomach fills with contrast and is normal.
IMPRESSION: Mild edema around the gastric fundus due to Nissen fundoplication.
Negative for leak.

## 2011-09-17 ENCOUNTER — Encounter: Payer: Self-pay | Admitting: Internal Medicine

## 2011-09-26 ENCOUNTER — Telehealth: Payer: Self-pay | Admitting: Family Medicine

## 2011-09-26 DIAGNOSIS — R739 Hyperglycemia, unspecified: Secondary | ICD-10-CM

## 2011-09-26 DIAGNOSIS — N401 Enlarged prostate with lower urinary tract symptoms: Secondary | ICD-10-CM

## 2011-09-26 NOTE — Telephone Encounter (Signed)
I put in the orders, so he can schedule a draw

## 2011-09-26 NOTE — Telephone Encounter (Signed)
Patient need fu labs ordered for glucose and psa per spouse. Please advise.

## 2011-10-16 ENCOUNTER — Telehealth: Payer: Self-pay | Admitting: Family Medicine

## 2011-10-16 NOTE — Telephone Encounter (Signed)
Patient's spouse called stating that he need a refill on his tomazepam. Please assist.

## 2011-10-16 NOTE — Telephone Encounter (Signed)
Refill request for Restoril 30 mg take 1 po qhs prn and pt last here on 05/09/11.

## 2011-10-17 NOTE — Telephone Encounter (Signed)
Ok to refill x 1 in Dr Claris Che absence

## 2011-10-18 MED ORDER — TEMAZEPAM 30 MG PO CAPS: 30.0000 mg | ORAL_CAPSULE | Freq: Every evening | ORAL | Status: DC | PRN

## 2011-10-18 NOTE — Telephone Encounter (Signed)
Addended by: Aniceto Boss A on: 10/18/2011 12:05 PM   Modules accepted: Orders

## 2011-10-18 NOTE — Telephone Encounter (Signed)
I called in script and spoke with pt. 

## 2011-10-24 ENCOUNTER — Other Ambulatory Visit (INDEPENDENT_AMBULATORY_CARE_PROVIDER_SITE_OTHER): Payer: Federal, State, Local not specified - PPO

## 2011-10-24 DIAGNOSIS — N401 Enlarged prostate with lower urinary tract symptoms: Secondary | ICD-10-CM

## 2011-10-24 DIAGNOSIS — N138 Other obstructive and reflux uropathy: Secondary | ICD-10-CM

## 2011-10-24 DIAGNOSIS — N139 Obstructive and reflux uropathy, unspecified: Secondary | ICD-10-CM

## 2011-10-24 DIAGNOSIS — R739 Hyperglycemia, unspecified: Secondary | ICD-10-CM

## 2011-10-24 DIAGNOSIS — R7309 Other abnormal glucose: Secondary | ICD-10-CM

## 2011-10-24 LAB — PSA: PSA: 3.27 ng/mL (ref 0.10–4.00)

## 2011-10-26 ENCOUNTER — Encounter: Payer: Self-pay | Admitting: Internal Medicine

## 2011-10-26 LAB — HEMOGLOBIN A1C: Hgb A1c MFr Bld: 5.6 % (ref 4.6–6.5)

## 2011-10-29 ENCOUNTER — Telehealth: Payer: Self-pay | Admitting: Family Medicine

## 2011-10-29 DIAGNOSIS — R972 Elevated prostate specific antigen [PSA]: Secondary | ICD-10-CM

## 2011-10-29 NOTE — Telephone Encounter (Signed)
Pt would like lab results.  

## 2011-10-30 NOTE — Telephone Encounter (Signed)
The PSA will often go up over time as a result of the gland getting larger (which happens with BPH). This is probably benign, but I agree that it has risen 2 points in the past 2 years. Lets get a specialist's opinion on this. I will refer him to Urology, and Camelia Eng will call about the appt

## 2011-10-30 NOTE — Telephone Encounter (Signed)
I spoke with pt and put a copy of results in mail. Pt is concerned that his PSA level is rising and wants to know more about this? Should he be concerned & what could be making the level go up?

## 2011-10-30 NOTE — Telephone Encounter (Signed)
These were normal. See the report

## 2011-10-30 NOTE — Progress Notes (Signed)
Quick Note:  Spoke with pt and put a copy of results in mail. ______ 

## 2011-10-30 NOTE — Telephone Encounter (Signed)
Spoke with pt

## 2011-11-14 ENCOUNTER — Telehealth: Payer: Self-pay | Admitting: Family Medicine

## 2011-11-14 NOTE — Telephone Encounter (Signed)
Refill request for Temazepam 30 mg take 1 po qhs and pt last here on 05/09/11.

## 2011-11-14 NOTE — Telephone Encounter (Signed)
Call in #30 with 5 rf 

## 2011-11-16 ENCOUNTER — Telehealth: Payer: Self-pay | Admitting: Family Medicine

## 2011-11-16 MED ORDER — TEMAZEPAM 30 MG PO CAPS: 30.0000 mg | ORAL_CAPSULE | Freq: Every evening | ORAL | Status: DC | PRN

## 2011-11-16 NOTE — Telephone Encounter (Signed)
I called in script, tried to call pt and no answer.

## 2011-11-16 NOTE — Telephone Encounter (Signed)
I called in script and tried to reach pt by phone, no answer. 

## 2011-11-16 NOTE — Telephone Encounter (Signed)
Patient's spouse called stating that they have been trying to get a refill of his tomazepam for 5days and that the last time they tried to have it refilled it took eight days to resolve. Please assist.

## 2011-11-22 ENCOUNTER — Ambulatory Visit (AMBULATORY_SURGERY_CENTER): Payer: Federal, State, Local not specified - PPO | Admitting: *Deleted

## 2011-11-22 VITALS — Ht 72.0 in | Wt 204.3 lb

## 2011-11-22 DIAGNOSIS — K2270 Barrett's esophagus without dysplasia: Secondary | ICD-10-CM

## 2011-12-06 ENCOUNTER — Encounter: Payer: Self-pay | Admitting: Internal Medicine

## 2011-12-06 ENCOUNTER — Ambulatory Visit (AMBULATORY_SURGERY_CENTER): Payer: Federal, State, Local not specified - PPO | Admitting: Internal Medicine

## 2011-12-06 VITALS — BP 142/85 | HR 72 | Temp 97.2°F | Resp 18 | Ht 72.0 in | Wt 204.0 lb

## 2011-12-06 DIAGNOSIS — K2270 Barrett's esophagus without dysplasia: Secondary | ICD-10-CM

## 2011-12-06 DIAGNOSIS — K219 Gastro-esophageal reflux disease without esophagitis: Secondary | ICD-10-CM

## 2011-12-06 DIAGNOSIS — K2970 Gastritis, unspecified, without bleeding: Secondary | ICD-10-CM

## 2011-12-06 MED ORDER — SODIUM CHLORIDE 0.9 % IV SOLN: 500.0000 mL | INTRAVENOUS | Status: DC

## 2011-12-06 NOTE — Progress Notes (Signed)
Patient did not experience any of the following events: a burn prior to discharge; a fall within the facility; wrong site/side/patient/procedure/implant event; or a hospital transfer or hospital admission upon discharge from the facility. (G8907) Patient did not have preoperative order for IV antibiotic SSI prophylaxis. (G8918)  

## 2011-12-06 NOTE — Op Note (Signed)
[facility] [address] [place], [place]  [address]  ENDOSCOPY PROCEDURE REPORT  PATIENT:  [name] [name], [name] [name]  MR#:  [id number] BIRTHDATE:  Jul 12, 1951, 61 yrs. old  GENDER:  male  ENDOSCOPIST:  Hedwig Morton. Juanda Chance, MD Referred by:  Tera Mater Clent Ridges, M.D.  PROCEDURE DATE:  12/06/2011 PROCEDURE:  EGD with biopsy, [id number] ASA CLASS:  Class II INDICATIONS:  h/o Barrett's Esophagus EGD 10/2009- Barrett's s/p Nissen fundoplication 2011  MEDICATIONS:   MAC sedation, administered by CRNA, propofol (Diprivan) 240 mg TOPICAL ANESTHETIC:  none  DESCRIPTION OF PROCEDURE:   After the risks benefits and alternatives of the procedure were thoroughly explained, informed consent was obtained.  The LB GIF-H180 [id number] endoscope was introduced through the mouth and advanced to the second portion of the duodenum, without limitations.  The instrument was slowly withdrawn as the mucosa was fully examined. <<PROCEDUREIMAGES>>  irregular Z-line. With standard forceps, a biopsy was obtained and sent to pathology (see image1 and image7).  Mild gastritis was found. antral erosions With standard forceps, a biopsy was obtained and sent to pathology (see image5, image4, and image2). Otherwise the examination was normal (see image6 and image3). functioning Nissen Fundoplication    Retroflexed views revealed no abnormalities.    The scope was then withdrawn from the patient and the procedure completed.  COMPLICATIONS:  None  ENDOSCOPIC IMPRESSION: 1) Irregular Z-line 2) Mild gastritis 3) Otherwise normal examination Barrett's esophagua functioning Nissen Fundoplication RECOMMENDATIONS: 1) Await biopsy results consider PPI  REPEAT EXAM:  In 2 year(s) for.  ______________________________ Hedwig Morton. Juanda Chance, MD  CC:  n. eSIGNED:   Hedwig Morton. Erhardt Dada at 12/06/2011 10:00 AM  Olean Ree, [id number]

## 2011-12-06 NOTE — Patient Instructions (Signed)

## 2011-12-07 ENCOUNTER — Telehealth: Payer: Self-pay | Admitting: *Deleted

## 2011-12-07 NOTE — Telephone Encounter (Signed)
  Follow up Call-  Call back number 12/06/2011  Post procedure Call Back phone  # [phone]  Permission to leave phone message No     Patient questions:  Do you have a fever, pain , or abdominal swelling? no Pain Score  0 *  Have you tolerated food without any problems? yes  Have you been able to return to your normal activities? yes  Do you have any questions about your discharge instructions: Diet   no Medications  no Follow up visit  no  Do you have questions or concerns about your Care? no  Actions: * If pain score is 4 or above: No action needed, pain <4.  "had gas last night, better today" per patient.

## 2011-12-13 ENCOUNTER — Encounter: Payer: Self-pay | Admitting: Internal Medicine

## 2012-01-16 ENCOUNTER — Other Ambulatory Visit: Payer: Self-pay | Admitting: Family Medicine

## 2012-01-16 NOTE — Telephone Encounter (Addendum)
Pt needs new rxs on all maintenance med except valtrex fax to [facility] [phone]. Pt needs #90 each meds with 3 refills. Please include ID [id number] ss#[phone]

## 2012-01-22 MED ORDER — ATORVASTATIN CALCIUM 20 MG PO TABS: 20.0000 mg | ORAL_TABLET | Freq: Every day | ORAL | Status: DC

## 2012-01-22 MED ORDER — TAMSULOSIN HCL 0.4 MG PO CAPS: 0.4000 mg | ORAL_CAPSULE | Freq: Every day | ORAL | Status: DC

## 2012-01-22 MED ORDER — FENOFIBRATE 160 MG PO TABS: 160.0000 mg | ORAL_TABLET | Freq: Every day | ORAL | Status: DC

## 2012-01-22 NOTE — Telephone Encounter (Signed)
Ready to be faxed.

## 2012-01-22 NOTE — Telephone Encounter (Signed)
Scripts printed and faxed °

## 2012-01-24 ENCOUNTER — Telehealth: Payer: Self-pay | Admitting: Family Medicine

## 2012-01-24 NOTE — Telephone Encounter (Signed)
Pt called to check on status of getting all meds transferred to [facility] mail order pharmacy [phone] and their Fax # is [phone] mem id # [id number]. Pt wants all meds to be generic.  ° °[facility] LONG[facility]. PLS Tranfer med to [facility] mail [facility] pharmacy. °Pls call pt when this has been taken care of or if any questions.  °

## 2012-01-25 MED ORDER — TEMAZEPAM 30 MG PO CAPS: 30.0000 mg | ORAL_CAPSULE | Freq: Every evening | ORAL | Status: DC | PRN

## 2012-01-25 NOTE — Telephone Encounter (Signed)
done

## 2012-01-25 NOTE — Telephone Encounter (Signed)
Script was printed and faxed to [facility].

## 2012-01-25 NOTE — Telephone Encounter (Signed)
I did fax all scripts except the Restoril 30 mg, can we do a 90 day supply for that and fax to [facility]?

## 2012-01-30 ENCOUNTER — Telehealth: Payer: Self-pay | Admitting: Family Medicine

## 2012-01-30 NOTE — Telephone Encounter (Signed)
Please update pharmacy for long term to [facility].

## 2012-03-05 ENCOUNTER — Telehealth: Payer: Self-pay | Admitting: Family Medicine

## 2012-03-05 NOTE — Telephone Encounter (Signed)
Caller: [name]/Patient; Patient Name: [name] [name]; PCP: Nelwyn Salisbury.; Best Callback Phone Number: [phone] Pt calling regarding abdominal discomfort and pressure over the last month. No specific date or episode. Pt has diverticulosis and "issues with gas" but wanted to make sure he wasn't overlooking anything. Afebrile. Takes Gas X OTC per pkg direction and prescribed meds with some relief. Emergent symptoms of Abdominal Pain ruled out. See within 2 weeks for: Repeated episodes of abdominal discomfort AND no previous evaluation by provider. Advised pt to call office in the am after 0800 for appt within the next 2 weeks. 1715-office closed.

## 2012-03-10 ENCOUNTER — Ambulatory Visit (INDEPENDENT_AMBULATORY_CARE_PROVIDER_SITE_OTHER): Admitting: Family Medicine

## 2012-03-10 ENCOUNTER — Encounter: Payer: Self-pay | Admitting: Family Medicine

## 2012-03-10 VITALS — BP 126/80 | HR 78 | Temp 98.3°F | Wt 202.0 lb

## 2012-03-10 DIAGNOSIS — K589 Irritable bowel syndrome without diarrhea: Secondary | ICD-10-CM

## 2012-03-10 DIAGNOSIS — M1990 Unspecified osteoarthritis, unspecified site: Secondary | ICD-10-CM

## 2012-03-10 NOTE — Progress Notes (Signed)
  Subjective:    Patient ID: [name] [name], male    DOB: October 05, 1950, 61 y.o.   MRN: [id number]  HPI Here to discuss his GI issues and his joints. He has known diverticulosis and he has recurrent bouts of mild LLQ cramps. He feels bloated frequently and passes a lot of flatus. He uses Gas X frequently and he takes Miralax every day. He has 3-4 small BMs a day. No nausea or fever. Also his hips and knees and hands get stiff and mildly painful at times. No swelling. He gets relief with Aleve but asks about anything he can so for prevention.    Review of Systems  Constitutional: Negative.   Respiratory: Negative.   Cardiovascular: Negative.   Gastrointestinal: Positive for abdominal pain and abdominal distention. Negative for nausea, vomiting, diarrhea, constipation, blood in stool and rectal pain.  Musculoskeletal: Positive for arthralgias.       Objective:   Physical Exam  Constitutional: He appears well-developed and well-nourished.  Abdominal: Soft. Bowel sounds are normal. He exhibits no distension and no mass. There is no tenderness. There is no rebound and no guarding.  Musculoskeletal: Normal range of motion. He exhibits no edema and no tenderness.          Assessment & Plan:  He seems to have some IBS. Suggested he add an Align capsule daily. He is due for another colonoscopy next year. Try glucosamine with chondroitin sulfate for the stiff joints.

## 2012-03-24 ENCOUNTER — Telehealth: Payer: Self-pay | Admitting: Internal Medicine

## 2012-03-24 ENCOUNTER — Telehealth: Payer: Self-pay | Admitting: Family Medicine

## 2012-03-24 MED ORDER — TRAZODONE HCL 50 MG PO TABS: 50.0000 mg | ORAL_TABLET | Freq: Every evening | ORAL | Status: DC | PRN

## 2012-03-24 MED ORDER — ESOMEPRAZOLE MAGNESIUM 40 MG PO CPDR: 40.0000 mg | DELAYED_RELEASE_CAPSULE | Freq: Every day | ORAL | Status: DC

## 2012-03-24 MED ORDER — DICYCLOMINE HCL 20 MG PO TABS: ORAL_TABLET | ORAL | Status: DC

## 2012-03-24 NOTE — Telephone Encounter (Signed)
Caller: Debra/Patient; Phone: [phone]; Reason for Call: Patient takes Tomazapam for sleep however its making him sleep walk and eat large amts of food is this a side effect?

## 2012-03-24 NOTE — Telephone Encounter (Signed)
I called in script and spoke with pt. 

## 2012-03-24 NOTE — Telephone Encounter (Signed)
Stop the Temazepam. Try Trazodone 50 mg qhs. Call in #30 with 2 rf. He can increase to 2 tabs (total 100 mg qhs) after a week if he wants to

## 2012-03-24 NOTE — Telephone Encounter (Signed)
Patient calling to report for the last 2 months, he has had LLQ pressure and reflux. States it has increased over the last month. Feels like gas and bloating. He is using Gas ex and Pepcid AC. He reports he did see his PCP Dr. Clent Ridges and was told to try Align which he has done for 2 weeks without improvement. He did try taking a Nexium last night and it helped with reflux but he still has the pressure feeling. HX Nissen Fundo, last endo in April- Barrett's esophagus. Please, advise.

## 2012-03-24 NOTE — Telephone Encounter (Signed)
Pt would like to try something else to help him rest?

## 2012-03-24 NOTE — Telephone Encounter (Signed)
Patient given recommendations by Dr. Juanda Chance. Rx sent to pharmacy

## 2012-03-24 NOTE — Telephone Encounter (Signed)
Yes these could be side effects of the Temazepam. Try taking this every other night and see if this improves

## 2012-03-24 NOTE — Telephone Encounter (Signed)
Please take Nexiem 40 mg daily x 2 weeks, also start Bentyl 20 mg po bid x 2 weeks, #30, if no better, consider UGI series

## 2012-03-24 NOTE — Telephone Encounter (Signed)
Left a message with patient's wife for patient to call me.

## 2012-04-10 ENCOUNTER — Telehealth: Payer: Self-pay | Admitting: Internal Medicine

## 2012-04-10 NOTE — Telephone Encounter (Signed)
Patient calling with 2 week update on symptoms. He states the Nexium and Bentyl helped him. He states he is doing great. No reflux or LLQ pressure. He thinks his hiatal hernia flared up with his symptoms and both are better. He is asking if he should continue the Nexium and Bentyl. Please, advise.

## 2012-04-10 NOTE — Telephone Encounter (Signed)
Please stop Bentyl at this point, but continue Nexiem x 1 more week then D/C.

## 2012-04-11 NOTE — Telephone Encounter (Signed)
Spoke with patient and gave him Dr. Brodie's recommendation 

## 2012-05-12 ENCOUNTER — Other Ambulatory Visit (INDEPENDENT_AMBULATORY_CARE_PROVIDER_SITE_OTHER)

## 2012-05-12 DIAGNOSIS — Z0000 Encounter for general adult medical examination without abnormal findings: Secondary | ICD-10-CM

## 2012-05-12 LAB — BASIC METABOLIC PANEL
CO2: 30 mEq/L (ref 19–32)
Calcium: 9.8 mg/dL (ref 8.4–10.5)
Creatinine, Ser: 0.7 mg/dL (ref 0.4–1.5)
Glucose, Bld: 107 mg/dL — ABNORMAL HIGH (ref 70–99)

## 2012-05-12 LAB — CBC WITH DIFFERENTIAL/PLATELET
Basophils Absolute: 0 10*3/uL (ref 0.0–0.1)
Eosinophils Absolute: 0.1 10*3/uL (ref 0.0–0.7)
Lymphocytes Relative: 26.9 % (ref 12.0–46.0)
MCHC: 34.1 g/dL (ref 30.0–36.0)
Neutrophils Relative %: 62.4 % (ref 43.0–77.0)
RBC: 4.57 Mil/uL (ref 4.22–5.81)
RDW: 13.3 % (ref 11.5–14.6)

## 2012-05-12 LAB — POCT URINALYSIS DIPSTICK
Bilirubin, UA: NEGATIVE
Ketones, UA: NEGATIVE
Leukocytes, UA: NEGATIVE
Protein, UA: NEGATIVE

## 2012-05-12 LAB — HEPATIC FUNCTION PANEL
Alkaline Phosphatase: 34 U/L — ABNORMAL LOW (ref 39–117)
Bilirubin, Direct: 0.2 mg/dL (ref 0.0–0.3)

## 2012-05-12 LAB — LIPID PANEL
Cholesterol: 152 mg/dL (ref 0–200)
HDL: 46.6 mg/dL (ref >39.00)
VLDL: 16.2 mg/dL (ref 0.0–40.0)

## 2012-05-16 ENCOUNTER — Telehealth: Payer: Self-pay | Admitting: Family Medicine

## 2012-05-16 NOTE — Progress Notes (Signed)
Quick Note:  I spoke with pt ______ 

## 2012-05-16 NOTE — Telephone Encounter (Signed)
Caller: [name] [name]/Patient; Patient Name: [name] [name]; PCP: Gershon Crane [facility]); Best Callback Phone Number: [phone], call following call from office regarding labs, requests for results on:  bilirubin, cholesterol, triglycerides per EPIC reviewed, denies any other concerns.

## 2012-05-19 NOTE — Telephone Encounter (Signed)
Please mail him a copy of his recent labs

## 2012-05-20 NOTE — Telephone Encounter (Signed)
I put a copy of results in mail. 

## 2012-05-27 ENCOUNTER — Ambulatory Visit (INDEPENDENT_AMBULATORY_CARE_PROVIDER_SITE_OTHER): Admitting: Family Medicine

## 2012-05-27 ENCOUNTER — Encounter: Payer: Self-pay | Admitting: Family Medicine

## 2012-05-27 VITALS — BP 128/82 | HR 77 | Temp 98.6°F | Ht 71.0 in | Wt 197.0 lb

## 2012-05-27 DIAGNOSIS — Z0000 Encounter for general adult medical examination without abnormal findings: Secondary | ICD-10-CM

## 2012-05-27 DIAGNOSIS — Z23 Encounter for immunization: Secondary | ICD-10-CM

## 2012-05-27 MED ORDER — TRAZODONE HCL 100 MG PO TABS: 100.0000 mg | ORAL_TABLET | Freq: Every day | ORAL | Status: DC

## 2012-05-27 MED ORDER — DICYCLOMINE HCL 20 MG PO TABS: 20.0000 mg | ORAL_TABLET | Freq: Three times a day (TID) | ORAL | Status: DC

## 2012-05-27 MED ORDER — ESOMEPRAZOLE MAGNESIUM 40 MG PO CPDR: 40.0000 mg | DELAYED_RELEASE_CAPSULE | Freq: Every day | ORAL | Status: DC

## 2012-05-27 NOTE — Addendum Note (Signed)
Addended by: Aniceto Boss A on: 05/27/2012 05:34 PM   Modules accepted: Orders

## 2012-05-27 NOTE — Progress Notes (Signed)
  Subjective:    Patient ID: [name] [name], male    DOB: September 21, 1950, 61 y.o.   MRN: [id number]  HPI 61 yr old male for a cpx. He feels well in general but has some questions. He would like to get off as many meds as possible, esepcially since his lipid panel looks so good lately. He gets some pain at times from his hiatal hernia and wants something he can take prn. He did not benefit from trazodone 50 mg for sleep and still struggles with insomnia.    Review of Systems  Constitutional: Negative.   HENT: Negative.   Eyes: Negative.   Respiratory: Negative.   Cardiovascular: Negative.   Gastrointestinal: Positive for abdominal pain. Negative for nausea, vomiting, diarrhea, constipation, blood in stool, abdominal distention, anal bleeding and rectal pain.  Genitourinary: Negative.   Musculoskeletal: Negative.   Skin: Negative.   Neurological: Negative.   Hematological: Negative.   Psychiatric/Behavioral: Positive for sleep disturbance. Negative for suicidal ideas, hallucinations, behavioral problems, confusion, self-injury, dysphoric mood, decreased concentration and agitation. The patient is not nervous/anxious and is not hyperactive.        Objective:   Physical Exam  Constitutional: He is oriented to person, place, and time. He appears well-developed and well-nourished. No distress.  HENT:  Head: Normocephalic and atraumatic.  Right Ear: External ear normal.  Left Ear: External ear normal.  Nose: Nose normal.  Mouth/Throat: Oropharynx is clear and moist. No oropharyngeal exudate.  Eyes: Conjunctivae normal and EOM are normal. Pupils are equal, round, and reactive to light. Right eye exhibits no discharge. Left eye exhibits no discharge. No scleral icterus.  Neck: Neck supple. No JVD present. No tracheal deviation present. No thyromegaly present.  Cardiovascular: Normal rate, regular rhythm, normal heart sounds and intact distal pulses.  Exam reveals no gallop and no friction  rub.   No murmur heard.      EKG normal   Pulmonary/Chest: Effort normal and breath sounds normal. No respiratory distress. He has no wheezes. He has no rales. He exhibits no tenderness.  Abdominal: Soft. Bowel sounds are normal. He exhibits no distension and no mass. There is no tenderness. There is no rebound and no guarding.  Genitourinary: Rectum normal, prostate normal and penis normal. Guaiac negative stool. No penile tenderness.  Musculoskeletal: Normal range of motion. He exhibits no edema and no tenderness.  Lymphadenopathy:    He has no cervical adenopathy.  Neurological: He is alert and oriented to person, place, and time. He has normal reflexes. No cranial nerve deficit. He exhibits normal muscle tone. Coordination normal.  Skin: Skin is warm and dry. No rash noted. He is not diaphoretic. No erythema. No pallor.  Psychiatric: He has a normal mood and affect. His behavior is normal. Judgment and thought content normal.          Assessment & Plan:  Well exam. We will stop Fenofibrate. Stay on Lipitor. Try Bentyl for hiatal hernia pain. Increase Trazodone to 100 mg qhs.

## 2012-05-29 ENCOUNTER — Telehealth: Payer: Self-pay | Admitting: Family Medicine

## 2012-05-29 NOTE — Telephone Encounter (Signed)
Caller: Debby/Spouse; Patient Name: [name] [name]; PCP: Gershon Crane [facility]); Best Callback Phone Number: [phone].  Patient calling about medication questions.  First, he had a muscle relaxant prescribed for chest pain, and patient sent it to his mail order pharmacy, who just informed them it would be another two weeks before they would send it.  Would like [facility] [address]  Also has question regarding sleeping pill; taking 100mg  and this is not touching him, and he is not sleeping at all.  Discussed could take up to 200mg  q hs, and wants to know if he can do that now and see if that improves his sleep.  Declines new triage; info to office for provider review/Rx/callback.   May reach patient at [phone].

## 2012-05-30 MED ORDER — DICYCLOMINE HCL 20 MG PO TABS: 20.0000 mg | ORAL_TABLET | Freq: Three times a day (TID) | ORAL | Status: DC

## 2012-05-30 NOTE — Telephone Encounter (Signed)
Call Bentyl to his local pharmacy for one month, 20 mg tid, #90. Yes he may take two Trazodones at bedtime (total of 200 mg) for a week and then let me know how this works

## 2012-05-30 NOTE — Telephone Encounter (Signed)
Bentyl called into pharmacy. Pt wife notified of this and the trazodone increase.

## 2012-06-02 ENCOUNTER — Telehealth: Payer: Self-pay | Admitting: Family Medicine

## 2012-06-02 NOTE — Telephone Encounter (Signed)
Pt states Dr. Clent Ridges put him on a new sleep med (traZODone (DESYREL) 100 MG tablet). He states that it does not help him sleep at all. He is also having probs urinating since being on it, which he states is a side effect listed. He has taken himself off it. Please advise if he can go back on the Temazepam, or something else. He put himself back on the Temazepam last night so he could sleep, however it causes him to sleep walk, and he finds himself in the refrigerator in the middle of the night.

## 2012-06-03 NOTE — Telephone Encounter (Signed)
Try Lunesta 3 mg qhs. Call in #30 with 5 rf

## 2012-06-04 MED ORDER — ESZOPICLONE 3 MG PO TABS: 3.0000 mg | ORAL_TABLET | Freq: Every day | ORAL | Status: DC

## 2012-06-04 NOTE — Telephone Encounter (Signed)
Wife called to ck status of RX.

## 2012-06-04 NOTE — Telephone Encounter (Signed)
I called in script and spoke to pt.

## 2012-06-06 ENCOUNTER — Telehealth: Payer: Self-pay | Admitting: Family Medicine

## 2012-06-06 NOTE — Telephone Encounter (Signed)
Dr. Clent Ridges,  [facility] [place] [facility] generic Sonata. [employer] will not pay for Lunesta unless Kathaleen Bury tried first. Thanks.

## 2012-06-06 NOTE — Telephone Encounter (Signed)
Cancel the Lunesta and call in Sonata 10 mg qhs, #30 with 5 rf

## 2012-06-09 ENCOUNTER — Telehealth: Payer: Self-pay | Admitting: Family Medicine

## 2012-06-09 MED ORDER — ZALEPLON 10 MG PO CAPS: 10.0000 mg | ORAL_CAPSULE | Freq: Every day | ORAL | Status: DC

## 2012-06-09 NOTE — Telephone Encounter (Signed)
Patient Information:  Caller Name: Koren  Phone: [phone]  Patient: [name], [name]  Gender: Male  DOB: Jun 22, 1951  Age: 61 Years  PCP: Gershon Crane [facility])   Symptoms  Reason For Call & Symptoms: intermittent central chest pain when sits or lies down.  Not presene with exertion.  History of hiatal hernia.  Reviewed Health History In EMR: Yes  Reviewed Medications In EMR: Yes  Reviewed Allergies In EMR: Yes  Date of Onset of Symptoms: 04/07/2012  Treatments Tried: Nexium, muscle relaxer  Treatments Tried Worked: No  Guideline(s) Used:  Chest Pain  Disposition Per Guideline:   Call EMS 911 Now  Reason For Disposition Reached:   Chest pain lasting longer than 5 minutes and ANY of the following:  Over 69 years old Over 10 years old and at least one cardiac risk factor (i.e., high blood pressure, diabetes, high cholesterol, obesity, smoker or strong family history of heart disease) Pain is crushing, pressure-like, or heavy  Took nitroglycerin and chest pain was not relieved History of heart disease (i.e., angina, heart attack, bypass surgery, angioplasty, CHF)  Advice Given:  N/A  Office Follow Up:  Does the office need to follow up with this patient?: No  Instructions For The Office: N/A  RN Note:  61 yo, hx hyperlipidemia. Called back regarding episodes of intermittent chest discomfort. Reports constant chest pressure "in solar plexis" and L side of chest.  Pain not worsened by exertion. Pain present within last hour for > 5 minutes. Chest porse when sits, espedially in evening or lies down.  No improvement with Nexium and muscle relaxer ordered by MD 2 weeks ago.  No diaphoresis or nausea/vomiting.  History of chronic shoulder pain. Agreed to go to ED; uncertian if will call 911.

## 2012-06-09 NOTE — Telephone Encounter (Signed)
I called in script 

## 2012-06-11 ENCOUNTER — Telehealth: Payer: Self-pay | Admitting: Internal Medicine

## 2012-06-11 DIAGNOSIS — R12 Heartburn: Secondary | ICD-10-CM

## 2012-06-11 MED ORDER — ESOMEPRAZOLE MAGNESIUM 40 MG PO CPDR: DELAYED_RELEASE_CAPSULE | ORAL | Status: DC

## 2012-06-11 MED ORDER — SUCRALFATE 1 GM/10ML PO SUSP: ORAL | Status: DC

## 2012-06-11 NOTE — Telephone Encounter (Signed)
Scheduled barium esophagram and UGI series on 06/18/12 at 9:15/9:30 AM at [facility] radiology(tiffany). NPO after midnight. Patient notified of appointment date, time and instructions. Rx sent to pharmacy.

## 2012-06-11 NOTE — Telephone Encounter (Signed)
Spoke with patient and he states he has scheduled OV on 07/30/12. He is having problems that comes and goes with hiatal hernia. States he feels pressure in his chest when he sits or with lying down. He is also having problems with acid reflux keeping him up at night. He saw Dr. Abran Cantor his PCP and was started on Nexium in AM and Dicyclomine TID.  This has not helped much. He is asking if he needs to have any tests done prior to OV in January. Please, advise.

## 2012-06-11 NOTE — Telephone Encounter (Signed)
I have spoken to the pt at length. Please schedule Barium esophagram with UGI series to assess NIssen Fundoplication. " Recurrent heartburn". Also, please change his Nexiem prescription to 40 mg po bid ( 90 day supply)  And send Carafate slurry 10cc po bid,#12 oz, 1 refill. He is at his home number now.

## 2012-06-18 ENCOUNTER — Ambulatory Visit (HOSPITAL_COMMUNITY)
Admission: RE | Admit: 2012-06-18 | Discharge: 2012-06-18 | Disposition: A | Discharge status: Home / Self Care | Source: Ambulatory Visit | Attending: Internal Medicine | Admitting: Internal Medicine

## 2012-06-18 DIAGNOSIS — R12 Heartburn: Secondary | ICD-10-CM

## 2012-06-18 DIAGNOSIS — K224 Dyskinesia of esophagus: Secondary | ICD-10-CM | POA: Insufficient documentation

## 2012-06-18 DIAGNOSIS — Z9889 Other specified postprocedural states: Secondary | ICD-10-CM | POA: Insufficient documentation

## 2012-06-30 ENCOUNTER — Encounter: Payer: Self-pay | Admitting: *Deleted

## 2012-07-10 ENCOUNTER — Encounter: Payer: Self-pay | Admitting: Nurse Practitioner

## 2012-07-10 ENCOUNTER — Telehealth: Payer: Self-pay | Admitting: Internal Medicine

## 2012-07-10 ENCOUNTER — Ambulatory Visit (INDEPENDENT_AMBULATORY_CARE_PROVIDER_SITE_OTHER): Admitting: Nurse Practitioner

## 2012-07-10 VITALS — BP 132/98 | HR 80 | Ht 70.25 in | Wt 197.4 lb

## 2012-07-10 DIAGNOSIS — IMO0001 Reserved for inherently not codable concepts without codable children: Secondary | ICD-10-CM

## 2012-07-10 DIAGNOSIS — M7918 Myalgia, other site: Secondary | ICD-10-CM

## 2012-07-10 NOTE — Telephone Encounter (Signed)
Patient is having chest pressure and pain.  He is scheduled to see Dr. Juanda Chance on 07/30/12.  He will come in today and see Willette Cluster RNP at 1:30

## 2012-07-10 NOTE — Telephone Encounter (Signed)
Left message for patient to call back  

## 2012-07-10 NOTE — Patient Instructions (Addendum)
Please continue your probiotic that you were given.  Please stop taking your Dicyclomine/Bentyl.  Please avoid repetitive movement or exercise using chest muscles.    Please take your Nexium when taking your anti-inflammatory medication.  ______________________________________________________________________________________________________________________

## 2012-07-10 NOTE — Progress Notes (Signed)
07/10/2012 [name] [name] [id number] 24-Aug-1950   History of Present Illness: Patient is a 61 year old male known to Dr. Juanda Chance. He has a history of GERD, status post Nissan fundoplication 2011. Patient worked in for chest pain today. He had been started on daily Nexium and TID Dicyclomine by PCP, neither helped chest pain. He was evaluated in ED [facility]) late last month. Workup negative for cardiac related chest pain per patient. Patient called out office for advice and was given an appointment to see Dr. Juanda Chance in January. In the interim we gave him a trial of Carafate slurry twice daily and ordered a barium swallow for further evaluation. Barium swallow was unremarkable except for mild nonspecific dysmotility. Patient called the office this morning with chest pain, he was worked in to see me today. Patient states the pain is positional, it can be worse with sitting. It is also aggravated by stress. Over the last 3 months pain has been intermittent, it got worse late. Laying on right side helps  Moving around / exercising helps. Anti-inflammatory helps.  Interestingly he does 400 sit ups every morning without pain.   Since Nissen, patient eats small bites very slowly. He did have an episode of dyphagia (ham) three months ago, none since.   Over the last couple of patient has developed  Postprandial abdominal distention  Distention begins first meal of day. Bowel movements are okay. No nausea.     Current Medications, Allergies, Past Medical History, Past Surgical History, Family History and Social History were reviewed in [employer] record.   Physical Exam: General: Well developed , white male in no acute distress Head: Normocephalic and atraumatic Eyes:  sclerae anicteric, conjunctiva pink  Ears: Normal auditory acuity Lungs: Clear throughout to auscultation Heart: Regular rate and rhythm Abdomen: Soft, non tender and non distended. No masses, no  hepatomegaly. Normal bowel sounds Musculoskeletal: Distal sternum tenderness Extremities: No edema  Neurological: Alert oriented x 4, grossly nonfocal Psychological:  Alert and cooperative. Normal mood and affect  Assessment and Recommendations:  29. 61 year old white male with chronic (years) chest pain. By history and exam patient has chest wall pain. EGD and UGI unrevealing. Patient would like to cut back on amount of medications he is taking. Advised him to continue PPI since he is taking NSAIDS. No need to take Bentyl for this chest pain, especially since it hasn't helped.  2. GERD / Barrett's. Patient is s/p Nissen Fundoplication 2012.  Most recent EGD late May 2013 with findings of irregular Z line, functioning Nissen, gastritis. Esophageal biopsies c/w Barretts. Given Barrett's he should probably continue PPI though patient would like to decrease amount of meds he is taking.    I don't think patient needs to keep his appointment with  Dr. Juanda Chance in a couple of weeks but will send note to her regarding this.

## 2012-07-17 ENCOUNTER — Encounter: Payer: Self-pay | Admitting: Nurse Practitioner

## 2012-07-21 ENCOUNTER — Other Ambulatory Visit: Payer: Self-pay | Admitting: *Deleted

## 2012-07-21 MED ORDER — SUCRALFATE 1 GM/10ML PO SUSP: ORAL | Status: DC

## 2012-07-30 ENCOUNTER — Ambulatory Visit: Admitting: Internal Medicine

## 2012-08-04 ENCOUNTER — Telehealth: Payer: Self-pay | Admitting: Family Medicine

## 2012-08-04 NOTE — Telephone Encounter (Signed)
Refill request for Temazepam 30 mg take 1 po qhs prn and last here on 05/27/12.

## 2012-08-05 MED ORDER — TEMAZEPAM 15 MG PO CAPS: 15.0000 mg | ORAL_CAPSULE | Freq: Every evening | ORAL | Status: DC | PRN

## 2012-08-05 NOTE — Telephone Encounter (Signed)
Pt requested a 90 day supply, per Dr. Clent Ridges okay to change to amount and I did print and fax.

## 2012-08-05 NOTE — Telephone Encounter (Signed)
Call in #30 with 5 rf 

## 2012-08-14 ENCOUNTER — Telehealth: Payer: Self-pay | Admitting: Family Medicine

## 2012-08-14 DIAGNOSIS — M1990 Unspecified osteoarthritis, unspecified site: Secondary | ICD-10-CM

## 2012-08-14 MED ORDER — TEMAZEPAM 30 MG PO CAPS: 30.0000 mg | ORAL_CAPSULE | Freq: Every evening | ORAL | Status: DC | PRN

## 2012-08-14 NOTE — Telephone Encounter (Signed)
Pt is taking Temazepam 30 mg not 15 mg, we sent in for 15 mg. Please fix and resend.

## 2012-08-14 NOTE — Telephone Encounter (Signed)
I reprinted corrected script and faxed to [facility] and spoke with pt.

## 2012-08-14 NOTE — Telephone Encounter (Signed)
He has diffuse arthritis pains and asks to see a Rheumatologist, so we will set this up

## 2012-10-27 ENCOUNTER — Telehealth: Payer: Self-pay | Admitting: Family Medicine

## 2012-10-27 ENCOUNTER — Encounter: Payer: Self-pay | Admitting: Family Medicine

## 2012-10-27 ENCOUNTER — Ambulatory Visit (INDEPENDENT_AMBULATORY_CARE_PROVIDER_SITE_OTHER): Admitting: Family Medicine

## 2012-10-27 VITALS — BP 140/82 | HR 93 | Temp 98.1°F | Wt 204.0 lb

## 2012-10-27 DIAGNOSIS — T148 Other injury of unspecified body region: Secondary | ICD-10-CM

## 2012-10-27 DIAGNOSIS — W57XXXA Bitten or stung by nonvenomous insect and other nonvenomous arthropods, initial encounter: Secondary | ICD-10-CM

## 2012-10-27 DIAGNOSIS — IMO0001 Reserved for inherently not codable concepts without codable children: Secondary | ICD-10-CM

## 2012-10-27 DIAGNOSIS — M7918 Myalgia, other site: Secondary | ICD-10-CM

## 2012-10-27 MED ORDER — DOXYCYCLINE HYCLATE 100 MG PO CAPS: 100.0000 mg | ORAL_CAPSULE | Freq: Two times a day (BID) | ORAL | Status: AC

## 2012-10-27 NOTE — Telephone Encounter (Signed)
I see he is coming in today

## 2012-10-27 NOTE — Progress Notes (Signed)
  Subjective:    Patient ID: [name] [name], male    DOB: 1951-05-26, 62 y.o.   MRN: [id number]  HPI Here for several issues. First he pulled a tick off his right chest about 10 days ago. He thinks it had only been on him for a day at most. Then 2 days ago he noticed a red rash over the right chest. No fevers. He also mentions more stiffness and pain in multiple joints over the past year. He was diagnosed by a rheumatologist with osteoarthritis. He takes Tylenol, and he knows to avoid any NSAIDs. He asks if there are any supplements he could try.    Review of Systems  Constitutional: Negative.   Musculoskeletal: Positive for arthralgias.  Skin: Positive for rash.       Objective:   Physical Exam  Constitutional: He appears well-developed and well-nourished.  Cardiovascular: Normal rate, regular rhythm, normal heart sounds and intact distal pulses.   Pulmonary/Chest: Effort normal and breath sounds normal.  Skin:  There is a tiny punctate wound on the right chest which is surrounded by a red macular spot about 8 cm in diameter. No central clearing.           Assessment & Plan:  Tick bite with a target rash. Check Lyme titers. Cover with Doxycycline. I suggested he try glucosamine with chondroitin sulfate OTC for this joints.

## 2012-10-27 NOTE — Telephone Encounter (Signed)
Patient Information:  Caller Name: [place]  Phone: [phone]  Patient: [name] [name], [name] [name]  Gender: Male  DOB: 24-Mar-1951  Age: 62 Years  PCP: Gershon Crane [facility])  Office Follow Up:  Does the office need to follow up with this patient?: Yes  Instructions For The Office: Pt scheduled at 16:15 with Dr. Clent Ridges. Has tick bite with bulls eye rash. Pt is asking if MD would just call in abx tx for him like last time he wouldn't need to come in. RN sent request to office.   Symptoms  Reason For Call & Symptoms: Pt was bit by a tick 7-10 days ago. He has a rash - onset Friday evening.  Reviewed Health History In EMR: Yes  Reviewed Medications In EMR: Yes  Reviewed Allergies In EMR: Yes  Reviewed Surgeries / Procedures: Yes  Date of Onset of Symptoms: 10/24/2012  Guideline(s) Used:  Tick Bite  Disposition Per Guideline:   Go to Office Now  Reason For Disposition Reached:   Widespread rash occurs, 2 to 14 days following the bite  Advice Given:  N/A  Patient Will Follow Care Advice:  YES  Appointment Scheduled:  10/27/2012 16:15:00 Appointment Scheduled Provider:  Gershon Crane [facility])

## 2012-11-03 NOTE — Progress Notes (Signed)
Quick Note:  I spoke with pt ______ 

## 2013-01-08 ENCOUNTER — Other Ambulatory Visit: Payer: Self-pay | Admitting: Family Medicine

## 2013-01-16 ENCOUNTER — Other Ambulatory Visit: Payer: Self-pay | Admitting: Family Medicine

## 2013-01-19 MED ORDER — TAMSULOSIN HCL 0.4 MG PO CAPS: 0.4000 mg | ORAL_CAPSULE | Freq: Every day | ORAL | Status: DC

## 2013-01-19 NOTE — Addendum Note (Signed)
Addended by: Aniceto Boss A on: 01/19/2013 11:51 AM   Modules accepted: Orders

## 2013-01-19 NOTE — Telephone Encounter (Signed)
I had to resend Flomax to [facility], it did not go through.

## 2013-01-26 ENCOUNTER — Telehealth: Payer: Self-pay | Admitting: Family Medicine

## 2013-01-26 MED ORDER — TAMSULOSIN HCL 0.4 MG PO CAPS: 0.4000 mg | ORAL_CAPSULE | Freq: Every day | ORAL | Status: DC

## 2013-01-26 NOTE — Telephone Encounter (Signed)
Pt cannot get the tamsulosin (FLOMAX) 0.4 MG CAP via mail order pharmacy any longer.  They are [facility] a [facility] [address] [facility] [place].

## 2013-01-26 NOTE — Telephone Encounter (Signed)
I did send script e-scribe.  

## 2013-03-17 ENCOUNTER — Telehealth: Payer: Self-pay | Admitting: Family Medicine

## 2013-03-17 NOTE — Telephone Encounter (Signed)
Refill request for Temazepam 30 mg take 1 po qhs prn and a 90 day supply to [facility].

## 2013-03-18 MED ORDER — TEMAZEPAM 30 MG PO CAPS: 30.0000 mg | ORAL_CAPSULE | Freq: Every evening | ORAL | Status: DC | PRN

## 2013-03-18 NOTE — Telephone Encounter (Signed)
Refill for 6 months. 

## 2013-03-18 NOTE — Telephone Encounter (Signed)
I faxed script 

## 2013-04-07 ENCOUNTER — Encounter: Payer: Self-pay | Admitting: Internal Medicine

## 2013-04-08 ENCOUNTER — Telehealth: Payer: Self-pay | Admitting: Internal Medicine

## 2013-04-08 DIAGNOSIS — K2270 Barrett's esophagus without dysplasia: Secondary | ICD-10-CM

## 2013-04-08 DIAGNOSIS — Z1211 Encounter for screening for malignant neoplasm of colon: Secondary | ICD-10-CM

## 2013-04-08 NOTE — Telephone Encounter (Signed)
Patient is due for recall colon. He will be due for recall EGD 11/2013. He is asking if he can have both done in December. He is not having any problems. Please, advise.

## 2013-04-08 NOTE — Telephone Encounter (Signed)
It will be OK to do both EGD and colonoscopy in December 2014.

## 2013-04-09 NOTE — Telephone Encounter (Signed)
Spoke with patient and scheduled Prop ECOL at [facility] on 06/24/13 at 1:30 PM and Pre visit on 06/16/13 at 1:00 PM.

## 2013-04-13 ENCOUNTER — Other Ambulatory Visit: Payer: Self-pay | Admitting: Family Medicine

## 2013-04-13 ENCOUNTER — Telehealth: Payer: Self-pay | Admitting: Family Medicine

## 2013-04-13 NOTE — Telephone Encounter (Signed)
Call this in to use behind the ear every 3 days prn, #30 with no rf. His wife can use these also

## 2013-04-13 NOTE — Telephone Encounter (Signed)
Pt wife called and requested a refill of his scopolamine (TRANSDERM-SCOP) 1.5 MG. She states that they need one for him, and one for her. [facility] would [facility] [address], in [place], [place]. Please assist.

## 2013-04-13 NOTE — Telephone Encounter (Signed)
Refill request

## 2013-04-14 MED ORDER — SCOPOLAMINE 1 MG/3DAYS TD PT72: 1.0000 | MEDICATED_PATCH | TRANSDERMAL | Status: DC

## 2013-04-14 NOTE — Telephone Encounter (Signed)
Rx sent to pharmacy   

## 2013-04-15 ENCOUNTER — Telehealth: Payer: Self-pay | Admitting: Family Medicine

## 2013-04-15 NOTE — Telephone Encounter (Signed)
Recv fax back from [facility] stating more information is needed to approve Transderm-scop 1.5mg /72hr.  Spoke to representative at [employer] and was informed that in order for this Rx to be approved via retail they need to know if there are any contraindications as to why Meclizine HCL tablet wasn't tried first?? Or this script can go mail order with out prior auth needed, since this is a mail order script.  Please advise.  Thanks a bunch  [employer]

## 2013-04-16 NOTE — Telephone Encounter (Signed)
We can certify that Meclizine cannot be used because it is too sedating

## 2013-04-17 NOTE — Telephone Encounter (Signed)
Yes. I have the paperwork. Will add the part about the meclizine and send back. Thanks!

## 2013-04-17 NOTE — Telephone Encounter (Signed)
Can you check into this? 

## 2013-04-23 ENCOUNTER — Other Ambulatory Visit: Payer: Self-pay | Admitting: Internal Medicine

## 2013-05-05 ENCOUNTER — Other Ambulatory Visit: Payer: Self-pay | Admitting: Family Medicine

## 2013-05-21 ENCOUNTER — Other Ambulatory Visit (INDEPENDENT_AMBULATORY_CARE_PROVIDER_SITE_OTHER)

## 2013-05-21 DIAGNOSIS — Z0000 Encounter for general adult medical examination without abnormal findings: Secondary | ICD-10-CM

## 2013-05-21 LAB — CBC WITH DIFFERENTIAL/PLATELET
Eosinophils Absolute: 0.1 10*3/uL (ref 0.0–0.7)
Eosinophils Relative: 2.5 % (ref 0.0–5.0)
Lymphocytes Relative: 26.3 % (ref 12.0–46.0)
MCHC: 34.7 g/dL (ref 30.0–36.0)
Monocytes Relative: 8.7 % (ref 3.0–12.0)
Neutrophils Relative %: 61.8 % (ref 43.0–77.0)
Platelets: 197 10*3/uL (ref 150.0–400.0)
WBC: 5.2 10*3/uL (ref 4.5–10.5)

## 2013-05-21 LAB — LIPID PANEL
LDL Cholesterol: 77 mg/dL (ref 0–99)
VLDL: 21.2 mg/dL (ref 0.0–40.0)

## 2013-05-21 LAB — POCT URINALYSIS DIPSTICK
Ketones, UA: NEGATIVE
Leukocytes, UA: NEGATIVE
Protein, UA: NEGATIVE
Urobilinogen, UA: 0.2
pH, UA: 5.5

## 2013-05-21 LAB — BASIC METABOLIC PANEL
BUN: 11 mg/dL (ref 6–23)
Calcium: 9.4 mg/dL (ref 8.4–10.5)
Creatinine, Ser: 0.5 mg/dL (ref 0.4–1.5)
GFR: 171.42 mL/min (ref >60.00)
Glucose, Bld: 109 mg/dL — ABNORMAL HIGH (ref 70–99)
Potassium: 4.8 mEq/L (ref 3.5–5.1)

## 2013-05-21 LAB — HEPATIC FUNCTION PANEL
ALT: 29 U/L (ref 0–53)
Albumin: 4 g/dL (ref 3.5–5.2)
Bilirubin, Direct: 0.2 mg/dL (ref 0.0–0.3)
Total Bilirubin: 3.5 mg/dL — ABNORMAL HIGH (ref 0.3–1.2)
Total Protein: 6.5 g/dL (ref 6.0–8.3)

## 2013-05-21 LAB — TSH: TSH: 0.93 u[IU]/mL (ref 0.35–5.50)

## 2013-05-21 LAB — PSA: PSA: 1.41 ng/mL (ref 0.10–4.00)

## 2013-05-25 NOTE — Progress Notes (Signed)
Quick Note:  Pt has appointment on 05/28/13 will go over then, also released results in my chart. ______

## 2013-05-28 ENCOUNTER — Ambulatory Visit (INDEPENDENT_AMBULATORY_CARE_PROVIDER_SITE_OTHER): Admitting: Family Medicine

## 2013-05-28 ENCOUNTER — Encounter: Payer: Self-pay | Admitting: Family Medicine

## 2013-05-28 VITALS — BP 120/80 | HR 90 | Temp 97.8°F | Ht 70.75 in | Wt 188.0 lb

## 2013-05-28 DIAGNOSIS — Z0000 Encounter for general adult medical examination without abnormal findings: Secondary | ICD-10-CM

## 2013-05-28 MED ORDER — TAMSULOSIN HCL 0.4 MG PO CAPS: 0.8000 mg | ORAL_CAPSULE | Freq: Every day | ORAL | Status: DC

## 2013-05-28 NOTE — Progress Notes (Signed)
  Subjective:    Patient ID: [name] [name], male    DOB: April 13, 1951, 62 y.o.   MRN: [id number]  HPI 62 yr old male for a cpx. He feels well. He was having trouble with sciatica this summer and saw Dr. Ethelene Hal who sent him to PT. They taught him exercises to stretch his spine, and these have worked very well for him. He also had cortisone shots in both shoulders per Dr. Ethelene Hal, and these were helpful. He is scheduled for upper and lower endoscopies with Dr. Juanda Chance on 06-24-13.    Review of Systems  Constitutional: Negative.   HENT: Negative.   Eyes: Negative.   Respiratory: Negative.   Cardiovascular: Negative.   Gastrointestinal: Negative.   Genitourinary: Negative.   Musculoskeletal: Negative.   Skin: Negative.   Neurological: Negative.   Psychiatric/Behavioral: Negative.        Objective:   Physical Exam  Constitutional: He is oriented to person, place, and time. He appears well-developed and well-nourished. No distress.  HENT:  Head: Normocephalic and atraumatic.  Right Ear: External ear normal.  Left Ear: External ear normal.  Nose: Nose normal.  Mouth/Throat: Oropharynx is clear and moist. No oropharyngeal exudate.  Eyes: Conjunctivae and EOM are normal. Pupils are equal, round, and reactive to light. Right eye exhibits no discharge. Left eye exhibits no discharge. No scleral icterus.  Neck: Neck supple. No JVD present. No tracheal deviation present. No thyromegaly present.  Cardiovascular: Normal rate, regular rhythm, normal heart sounds and intact distal pulses.  Exam reveals no gallop and no friction rub.   No murmur heard. EKG normal   Pulmonary/Chest: Effort normal and breath sounds normal. No respiratory distress. He has no wheezes. He has no rales. He exhibits no tenderness.  Abdominal: Soft. Bowel sounds are normal. He exhibits no distension and no mass. There is no tenderness. There is no rebound and no guarding.  Genitourinary: Rectum normal, prostate normal and  penis normal. Guaiac negative stool. No penile tenderness.  Musculoskeletal: Normal range of motion. He exhibits no edema and no tenderness.  Lymphadenopathy:    He has no cervical adenopathy.  Neurological: He is alert and oriented to person, place, and time. He has normal reflexes. No cranial nerve deficit. He exhibits normal muscle tone. Coordination normal.  Skin: Skin is warm and dry. No rash noted. He is not diaphoretic. No erythema. No pallor.  Psychiatric: He has a normal mood and affect. His behavior is normal. Judgment and thought content normal.          Assessment & Plan:  Well exam. We will increase the Flomax to 0.8 mg daily to help with urinary frequency.

## 2013-05-28 NOTE — Progress Notes (Signed)
Pre visit review using our clinic review tool, if applicable. No additional management support is needed unless otherwise documented below in the visit note. 

## 2013-06-16 ENCOUNTER — Ambulatory Visit (AMBULATORY_SURGERY_CENTER): Payer: Self-pay

## 2013-06-16 VITALS — Ht 71.5 in | Wt 193.2 lb

## 2013-06-16 DIAGNOSIS — K2270 Barrett's esophagus without dysplasia: Secondary | ICD-10-CM

## 2013-06-16 DIAGNOSIS — Z800 Family history of malignant neoplasm of digestive organs: Secondary | ICD-10-CM

## 2013-06-16 MED ORDER — MOVIPREP 100 G PO SOLR: ORAL | Status: DC

## 2013-06-23 ENCOUNTER — Telehealth: Payer: Self-pay | Admitting: Internal Medicine

## 2013-06-23 NOTE — Telephone Encounter (Signed)
If he does not have a fever, we can go on with the procedure

## 2013-06-23 NOTE — Telephone Encounter (Signed)
Called patient and he states that he has developed a lot of head congestion and deep chest congestion which is nonproductive.  He states that he had a coughing spell around 7 am this morning which was pretty severe.  He is not running any fever but has been taking Dayquil.  He is having an Endo/Colon tomorrow with Dr. Juanda Chance and was concerned about being able to have the procedure.  I advised patient that I would send this to Dr. Juanda Chance and have her advise. I will call him back with an answer if she wants to proceed with procedures.

## 2013-06-23 NOTE — Telephone Encounter (Signed)
Called [name] [name],[name] [name] denies any fever. Actually [name] [name] stating he feels better today, temperature 97.6   Informed [name] [name] that Dr. Juanda Chance stating if afebrile, ok to proceed with prep and procedure tomorrow. [name] [name] in agreement with plan.

## 2013-06-24 ENCOUNTER — Encounter: Payer: Self-pay | Admitting: Internal Medicine

## 2013-06-24 ENCOUNTER — Ambulatory Visit (AMBULATORY_SURGERY_CENTER): Admitting: Internal Medicine

## 2013-06-24 VITALS — BP 145/90 | HR 69 | Temp 97.6°F | Resp 18 | Ht 71.5 in | Wt 193.0 lb

## 2013-06-24 DIAGNOSIS — K209 Esophagitis, unspecified: Secondary | ICD-10-CM

## 2013-06-24 DIAGNOSIS — K2270 Barrett's esophagus without dysplasia: Secondary | ICD-10-CM

## 2013-06-24 DIAGNOSIS — Z800 Family history of malignant neoplasm of digestive organs: Secondary | ICD-10-CM

## 2013-06-24 DIAGNOSIS — Z1211 Encounter for screening for malignant neoplasm of colon: Secondary | ICD-10-CM

## 2013-06-24 HISTORY — PX: COLONOSCOPY: SHX174

## 2013-06-24 HISTORY — PX: ESOPHAGOGASTRODUODENOSCOPY: SHX1529

## 2013-06-24 MED ORDER — SODIUM CHLORIDE 0.9 % IV SOLN: 500.0000 mL | INTRAVENOUS | Status: DC

## 2013-06-24 NOTE — Progress Notes (Signed)
Called to room to assist during endoscopic procedure.  Patient ID and intended procedure confirmed with present staff. Received instructions for my participation in the procedure from the performing physician.  

## 2013-06-24 NOTE — Op Note (Signed)
[facility] [address] [place], [address]   ENDOSCOPY PROCEDURE REPORT  PATIENT: [name] [name], [name] [name].  MR#: [id number] BIRTHDATE: [birth year] , 62  yrs. old GENDER: Male ENDOSCOPIST: Hart Carwin, MD REFERRED BY:  Gershon Crane, M.D. PROCEDURE DATE:  06/24/2013 PROCEDURE:  EGD w/ biopsy ASA CLASS:     Class II INDICATIONS:  history of Barrett's esophagus.   current esophagus on upper endoscopy in April 2011.  Status post Nissan fundoplication in 2011.  Last endoscopy in May 2013 I showed Barrett's esophagus.  MEDICATIONS: MAC sedation, administered by CRNA and propofol (Diprivan) 200mg  IV TOPICAL ANESTHETIC: none  DESCRIPTION OF PROCEDURE: After the risks benefits and alternatives of the procedure were thoroughly explained, informed consent was obtained.  The LB [id number] endoscope was introduced through the mouth and advanced to the second portion of the duodenum. Without limitations.  The instrument was slowly withdrawn as the mucosa was fully examined.      Esophagus: proximal mid and distal esophageal mucosa was normal. The Z line was irregular. Biopsies were taken to rule out Barrett's esophagus, there was a small 2-3 cm hiatal hernia which was reducible Stomach: Gastric force were unremarkable. The endoscope passed with ease into antrum which appeared normal. The gastric outlet was widely patent. Retroflexion of the endoscope revealed the intact Nissan fundoplication Duodenum duodenal bulb and descending duodenum was unremarkable[          The scope was then withdrawn from the patient and the procedure completed.  COMPLICATIONS: There were no complications. ENDOSCOPIC IMPRESSION: Barrett's esophagus by history. Status post followup biopsies Functioning Nissen fundoplication RECOMMENDATIONS: 1.  Anti-reflux regimen to be follow 2.  Await biopsy results 3.  rrepeat endoscopy pending biopsies  REPEAT EXAM:  eSigned:  Hart Carwin,  MD 06/24/2013 2:08 PM   CC:  PATIENT NAME:  [name] [name]. MR#: [id number]

## 2013-06-24 NOTE — Progress Notes (Signed)
Patient did not experience any of the following events: a burn prior to discharge; a fall within the facility; wrong site/side/patient/procedure/implant event; or a hospital transfer or hospital admission upon discharge from the facility. (G8907) Patient did not have preoperative order for IV antibiotic SSI prophylaxis. (G8918)  

## 2013-06-24 NOTE — Op Note (Signed)
[facility] [address] [place], [address]   COLONOSCOPY PROCEDURE REPORT  PATIENT: [name] [name], [name] [name].  MR#: [id number] BIRTHDATE: 1951/05/21 , 62  yrs. old GENDER: Male ENDOSCOPIST: Hart Carwin, MD REFERRED YQ:MVHQION Marguerita Beards, M.D. PROCEDURE DATE:  06/24/2013 PROCEDURE:   Colonoscopy, screening First Screening Colonoscopy - Avg.  risk and is 50 yrs.  old or older - No.  Prior Negative Screening - Now for repeat screening. 10 or more years since last screening  History of Adenoma - Now for follow-up colonoscopy & has been > or = to 3 yrs.  N/A  Polyps Removed Today? No.  Recommend repeat exam, <10 yrs? No. ASA CLASS:   Class II INDICATIONS:Average risk patient for colon cancer. MEDICATIONS: propofol (Diprivan) 200mg  IV  DESCRIPTION OF PROCEDURE:   After the risks benefits and alternatives of the procedure were thoroughly explained, informed consent was obtained.  A digital rectal exam revealed no abnormalities of the rectum.   The LB [id number]  endoscope was introduced through the anus and advanced to the cecum, which was identified by both the appendix and ileocecal valve. No adverse events experienced.   The quality of the prep was good, using MoviPrep  The instrument was then slowly withdrawn as the colon was fully examined.      COLON FINDINGS: Moderate diverticulosis was noted throughout the entire examined colon.  Retroflexed views revealed no abnormalities. The time to cecum=3 minutes 42 seconds.  Withdrawal time=6 minutes 2 seconds.  The scope was withdrawn and the procedure completed. COMPLICATIONS: There were no complications.  ENDOSCOPIC IMPRESSION: Moderate diverticulosis was noted throughout the entire examined colon  RECOMMENDATIONS: high-fiber diet Fiber supplement when necessary recall colonoscopy in 10 years   eSigned:  Hart Carwin, MD 06/24/2013 2:12 PM   cc:   PATIENT NAME:  [name]. MR#:  [id number]

## 2013-06-24 NOTE — Patient Instructions (Addendum)

## 2013-06-24 NOTE — Progress Notes (Signed)
Pt stable to RR talking

## 2013-06-25 NOTE — Telephone Encounter (Signed)
  Follow up Call-  Call back number 06/24/2013 12/06/2011  Post procedure Call Back phone  # [phone]  Permission to leave phone message Yes No     Patient questions:  Do you have a fever, pain , or abdominal swelling? no Pain Score  0 *  Have you tolerated food without any problems? yes  Have you been able to return to your normal activities? yes  Do you have any questions about your discharge instructions: Diet   no Medications  no Follow up visit  no  Do you have questions or concerns about your Care? no  Actions: * If pain score is 4 or above: No action needed, pain <4.

## 2013-07-01 ENCOUNTER — Encounter: Payer: Self-pay | Admitting: Internal Medicine

## 2013-07-03 ENCOUNTER — Encounter: Payer: Self-pay | Admitting: *Deleted

## 2013-07-13 ENCOUNTER — Telehealth: Payer: Self-pay | Admitting: Internal Medicine

## 2013-07-13 NOTE — Telephone Encounter (Signed)
Pt received his path letter and was glad to hear his path showed no Barrett's. Pt reports he is always anxious because he has a strong family hx of neoplasms of the GI tract. Pt states he never thought Barrett's "went away".  Explained to pt that with a reduction in irritation/reflux and PPI therapy, Barrettt's can go into "remission". Dr Juanda Chance, am I wrong about the "remission" of Barrett's? Thanks.

## 2013-07-14 NOTE — Telephone Encounter (Signed)
Informed pt of Dr Regino Schultze remarks and he stated understanding. He does state that any trauma to the area causes him heartburn and discomfort that can last for months. A cough, vomiting or the manipulation with an EGD also does it. One time after vomiting from a GI bug, the problem lasted 9 months. He has begun Nexium to see if it helps and will call back next week when Dr Juanda Chance is here [name] [name] problems or questions. He thanked Dr Juanda Chance for answering.

## 2013-07-14 NOTE — Telephone Encounter (Signed)
Yes, it can subside with proper therapy and Nissen fundoplication.

## 2013-07-20 ENCOUNTER — Telehealth: Payer: Self-pay | Admitting: Internal Medicine

## 2013-07-20 MED ORDER — SUCRALFATE 1 GM/10ML PO SUSP: ORAL | Status: DC

## 2013-07-20 NOTE — Telephone Encounter (Signed)
I have tried to call him in mobile phone but the number has been d/c'ed, I call home number- no answer, no recording. Please add Carafate slurry 10cc po bid x 7 days, OK to double up on the PPI

## 2013-07-20 NOTE — Telephone Encounter (Signed)
Rx sent. Left a message for patient to call me back.

## 2013-07-20 NOTE — Telephone Encounter (Signed)
Spoke with patient and he states that since his procedure on 06/24/13, he has had heartburn and pressure at the base of his sternum. Comes and goes during the day. Seems worse when sitting. He thinks it is from trauma from the procedure. He remembers having this with previous procedures. Also, has acid reflux. He started on Nexium BID last week. Asking if there is anything else he can try. Please, advise.

## 2013-07-21 NOTE — Telephone Encounter (Signed)
Spoke with patient and he will take the Carafate.

## 2013-07-29 ENCOUNTER — Telehealth: Payer: Self-pay | Admitting: Internal Medicine

## 2013-07-29 NOTE — Telephone Encounter (Signed)
I have spoken to the pt. Please send Dexilant 60mg , #30, 1 po bid x 2 weeks,  Also refill Carafate x1

## 2013-07-29 NOTE — Telephone Encounter (Signed)
Patient had EGD, colonoscopy a month ago. He has had problems with acid reflux and pressure in sternum. He started on Nexium BID and has been on Carafate BID since 07/20/13. The pressure is better. He is still having problems with acid reflux especially in the evenings/night. He has a couple of days of Carafate left. Wants to know if he should continue it or try something else. Please, advise.

## 2013-07-30 MED ORDER — SUCRALFATE 1 GM/10ML PO SUSP: ORAL | Status: DC

## 2013-07-30 MED ORDER — DEXLANSOPRAZOLE 60 MG PO CPDR: DELAYED_RELEASE_CAPSULE | ORAL | Status: DC

## 2013-07-30 NOTE — Telephone Encounter (Signed)
Rx's sent.  Patient aware.  

## 2013-08-19 ENCOUNTER — Telehealth: Payer: Self-pay | Admitting: Family Medicine

## 2013-08-19 DIAGNOSIS — R251 Tremor, unspecified: Secondary | ICD-10-CM

## 2013-08-19 NOTE — Telephone Encounter (Signed)
He has had worsening of the tremor in his hands so we will refer to Neurology

## 2013-09-01 ENCOUNTER — Ambulatory Visit: Admitting: Neurology

## 2013-09-02 ENCOUNTER — Telehealth: Payer: Self-pay | Admitting: Family Medicine

## 2013-09-02 MED ORDER — ATORVASTATIN CALCIUM 20 MG PO TABS: ORAL_TABLET | ORAL | Status: DC

## 2013-09-02 NOTE — Telephone Encounter (Signed)
[facility] requesting refill of atorvastatin (LIPITOR) 20 MG tablet

## 2013-09-02 NOTE — Telephone Encounter (Signed)
Rx sent to pharmacy   

## 2013-09-08 ENCOUNTER — Ambulatory Visit: Admitting: Neurology

## 2013-09-16 ENCOUNTER — Other Ambulatory Visit: Payer: Self-pay | Admitting: Internal Medicine

## 2013-09-21 ENCOUNTER — Ambulatory Visit (INDEPENDENT_AMBULATORY_CARE_PROVIDER_SITE_OTHER): Admitting: Neurology

## 2013-09-21 ENCOUNTER — Encounter: Payer: Self-pay | Admitting: Neurology

## 2013-09-21 VITALS — BP 120/70 | HR 80 | Resp 16 | Ht 71.0 in | Wt 194.2 lb

## 2013-09-21 DIAGNOSIS — R251 Tremor, unspecified: Secondary | ICD-10-CM

## 2013-09-21 DIAGNOSIS — R259 Unspecified abnormal involuntary movements: Secondary | ICD-10-CM

## 2013-09-21 DIAGNOSIS — G20A1 Parkinson's disease without dyskinesia, without mention of fluctuations: Secondary | ICD-10-CM | POA: Insufficient documentation

## 2013-09-21 DIAGNOSIS — G20 Parkinson's disease: Secondary | ICD-10-CM

## 2013-09-21 MED ORDER — PRAMIPEXOLE DIHYDROCHLORIDE ER 1.5 MG PO TB24: 1.0000 | ORAL_TABLET | Freq: Every day | ORAL | Status: DC

## 2013-09-21 MED ORDER — PRAMIPEXOLE DIHYDROCHLORIDE ER 0.375 MG PO TB24: ORAL_TABLET | ORAL | Status: DC

## 2013-09-21 NOTE — Patient Instructions (Addendum)
1. [facility]. [facility]. You will take 1 tablet daily for one week of Mirapex 0.375 mg. You will then take 1 tablet twice daily for one week. You will then switch to the Mirapex 1.5 mg tablets sent to [facility]. You will take this 1 tablet once daily.  2. We have scheduled you at [facility] for your OPEN MRI on 09/26/13 at 3:15 pm. Please arrive 30 minutes prior and go to [place]. If you need to change this appt please call [phone]. 3. Follow up 8 weeks.

## 2013-09-21 NOTE — Progress Notes (Signed)
[name] [name] was seen today in the movement disorders clinic for neurologic consultation at the request of FRY,[name], MD.  The consultation is for the evaluation of tremor.  Tremor started 1 year ago, but it has been getting worse since October.  The pt is R hand dominant.  The pt has noted worsening.  At first, it was positional and intermittent but that has changed.  It is more constant now.  He notices it at rest.  He can make it stop for a short period of time by concentrating on it.    Specific Symptoms:  Tremor: yes  Affected by caffeine:  no  Affected by alcohol: unknown  Affected by stress:  no  Affected by fatigue:  yes  Spills soup if on spoon:  no  Spills glass of liquid if full:  no  Affects ADL's (tying shoes, brushing teeth, etc):  no Voice: no changes Sleep: trouble initiating and maintaining sleep (never been good; is on restoril but it causes sleep walking/talking and binge eating)  Vivid Dreams:  yes  Acting out dreams:  yes (just the sleep walking) Wet Pillows: no Postural symptoms:  yes (relates to sciatica)  Falls?  yes (but related to tripping over stumps; cuts trees and clears land) Bradykinesia symptoms: difficulty getting out of a chair but better once sciatica cleared up; exercises a lot doing CV exercise but still has trouble getting off of the floor Loss of smell:  no Loss of taste:  no Urinary Incontinence:  no Difficulty Swallowing:  yes (relates to hx of nissen fundoplication) Handwriting, micrographia: yes (relates to R shoulder pain) Trouble with ADL's:  no  Trouble buttoning clothing: no Depression:  no Memory changes:  yes (trouble regurgitating a page after he read it and used to be good at it; 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   PREVIOUS MEDICATIONS: none to date  ALLERGIES:   Allergies  Allergen Reactions  . Penicillins Rash    CURRENT MEDICATIONS:  Current Outpatient Prescriptions on File Prior to Visit  Medication Sig Dispense Refill  . atorvastatin (LIPITOR) 20 MG tablet TAKE 1 TABLET DAILY  90 tablet  1  . beta carotene w/minerals (OCUVITE) tablet Take 1 tablet by mouth daily.      . Multiple Vitamin (MULTIVITAMIN) tablet Take 1 tablet by mouth daily.        . polyethylene glycol (MIRALAX / GLYCOLAX) packet Take 17 g by mouth daily.      . Probiotic Product (PROBIOTIC DAILY PO) Take 1 capsule by mouth daily.      . tamsulosin (FLOMAX) 0.4 MG CAPS capsule Take 2 capsules (0.8 mg total) by mouth daily.  180 capsule  3  . temazepam (RESTORIL) 30 MG capsule Take 1 capsule (30 mg total) by mouth at bedtime as needed for sleep.  90 capsule  1   No current facility-administered medications on file prior to visit.    PAST MEDICAL HISTORY:   Past Medical History  Diagnosis Date  . Hyperlipidemia   . BPH with urinary obstruction   . Insomnia   . GERD (gastroesophageal reflux disease)   . Barrett's esophagus     sees Dr. Delfin Edis   . History of hiatal hernia   . Arthritis   .  Chronic kidney disease     kidney stones, sees Dr. Raynelle Bring   . Diverticulosis   . Esophageal dysmotility   . [facility] spotted fever) 2012    PAST SURGICAL HISTORY:   Past Surgical History  Procedure Laterality Date  . Cholecystectomy    . Tonsillectomy and adenoidectomy    . Esophagogastroduodenoscopy  2004    showed GERD and Barretts  . Colonoscopy  2004    diverticulosis, repeat in 10 yrs   . Hernia repair      right inguinal  . Nissen fundoplication  0000000    laparoscopic per Dr. Johnathan Hausen  . Orchiopexy      right side at age 76   . Lipomas removed      x10/ on arms and legs  . Lasik      bilateral  . Upper gastrointestinal endoscopy      SOCIAL HISTORY:     History   Social History  . Marital Status: Married    Spouse Name: N/A    Number of Children: N/A  . Years of Education: N/A   Occupational History  . Not on file.   Social History Main Topics  . Smoking status: Never Smoker   . Smokeless tobacco: Never Used  . Alcohol Use: No  . Drug Use: No  . Sexual Activity: Not on file   Other Topics Concern  . Not on file   Social History Narrative  . No narrative on file    FAMILY HISTORY:   Family Status  Relation Status Death Age  . Mother Alive 99    rheumatoid arthritis  . Father Deceased 32    esophageal cancer  . Maternal Grandfather Deceased     esophagel cancer  . Paternal Grandmother Deceased   . Paternal Grandfather Deceased   . Sister Alive   . Brother Alive   . Daughter Alive   . Maternal Grandmother Deceased   . Sister Alive   . Sister Alive   . Sister Alive   . Daughter Alive     ROS:  A complete 10 system review of systems was obtained and was unremarkable apart from what is mentioned above.  PHYSICAL EXAMINATION:    VITALS:   Filed Vitals:   09/21/13 1221  BP: 120/70  Pulse: 80  Resp: 16  Height: 5\' 11"  (1.803 m)  Weight: 194 lb 3 oz (88.083 kg)    GEN:  The patient appears stated age and is in NAD. HEENT:  Normocephalic, atraumatic.  The mucous membranes are moist. The superficial temporal arteries are without ropiness or tenderness. CV:  RRR Lungs:  CTAB Neck/HEME:  There are no carotid bruits bilaterally.  Neurological examination:  Orientation: The patient is alert and oriented x3. Fund of knowledge is appropriate.  Recent and remote memory are intact.  Attention and concentration are normal.    Able to name objects and repeat phrases. Cranial nerves: There is good facial symmetry. Pupils are equal round and reactive to light bilaterally. Fundoscopic exam reveals clear margins bilaterally. Extraocular muscles are intact. The visual fields are full to confrontational testing. The speech  is fluent and clear. Soft palate rises symmetrically and there is no tongue deviation. Hearing is intact to conversational tone. Sensation: Sensation is intact to light and pinprick throughout (facial, trunk, extremities). Vibration is intact at the bilateral big toe. There is no extinction with double simultaneous stimulation. There is no sensory dermatomal level identified. Motor: Strength is 5/5 in  the bilateral upper and lower extremities.   Shoulder shrug is equal and symmetric.  There is no pronator drift.  No fasciculations. Deep tendon reflexes: Deep tendon reflexes are 3/4 at the bilateral biceps, triceps, brachioradialis, patella and 2+ at the bilateral achilles. Plantar responses are downgoing bilaterally.  Movement examination: Tone: There is increased tone in the RUE only, mild and most evident with activation.  Tone in the LUE and bilateral LE is normal.  Abnormal movements: mild resting tremor of the RUE, worse with distraction procedures Coordination:  There is decremation with RAM's, seen with finger taps, hand opening/closing and alternating supination/pronation of the forearm on the R.   Gait and Station: The patient has no difficulty arising out of a deep-seated chair without the use of the hands. The patient's stride length is normal with minor decreased arm swing on the R.  The patient has a negative pull test.      LABS  Lab Results  Component Value Date   TSH 0.93 05/21/2013     Chemistry      Component Value Date/Time   NA 139 05/21/2013 0908   K 4.8 05/21/2013 0908   CL 103 05/21/2013 0908   CO2 29 05/21/2013 0908   BUN 11 05/21/2013 0908   CREATININE 0.5 05/21/2013 0908      Component Value Date/Time   CALCIUM 9.4 05/21/2013 0908   ALKPHOS 65 05/21/2013 0908   AST 27 05/21/2013 0908   ALT 29 05/21/2013 0908   BILITOT 3.5* 05/21/2013 0908       ASSESSMENT/PLAN:  1.  Parkinsonism.  I suspect that this does represent idiopathic Parkinson's disease.  The patient has  tremor, mild bradykinesia, and mild rigidity.  There is no postural instability  -We discussed the diagnosis as well as pathophysiology of the disease.  We discussed treatment options as well as prognostic indicators.  Patient education was provided.  -I suspect that the reason he has such mild disease is because of all of the CV exercise that he does.  I encouraged him to continue this.   -Greater than 50% of the 80 minute visit was spent in counseling answering questions and talking about what to expect now as well as in the future.  We talked about medication options as well as potential future surgical options.  We talked about safety in the home.  -We decided to start mirapex er and work up to 1.5 mg daily.  Risks, benefits, side effects and alternative therapies were discussed, including but not limited to sleep attacks and compulsive behaviors.  The opportunity to ask questions was given and they were answered to the best of my ability.  The patient expressed understanding and willingness to follow the outlined treatment protocols.  -I am going to do an MRI brain to make sure that I am not missing anything 2.  Follow up in next 8 weeks.

## 2013-09-22 ENCOUNTER — Telehealth: Payer: Self-pay | Admitting: Family Medicine

## 2013-09-22 NOTE — Telephone Encounter (Signed)
[facility] requesting re-fill on temazepam (RESTORIL) 30 MG capsule

## 2013-09-23 MED ORDER — TEMAZEPAM 30 MG PO CAPS: 30.0000 mg | ORAL_CAPSULE | Freq: Every evening | ORAL | Status: DC | PRN

## 2013-09-23 NOTE — Telephone Encounter (Signed)
Script was faxed.

## 2013-09-23 NOTE — Telephone Encounter (Signed)
Refill for 6 months. 

## 2013-09-26 ENCOUNTER — Ambulatory Visit
Admission: RE | Admit: 2013-09-26 | Discharge: 2013-09-26 | Disposition: A | Discharge status: Home / Self Care | Source: Ambulatory Visit | Attending: Neurology | Admitting: Neurology

## 2013-09-26 DIAGNOSIS — R251 Tremor, unspecified: Secondary | ICD-10-CM

## 2013-09-28 ENCOUNTER — Telehealth: Payer: Self-pay | Admitting: Family Medicine

## 2013-09-28 NOTE — Telephone Encounter (Signed)
Pt needs refill on temazepam 30 mg #10 [facility] [place] [address] [name] is waiting on [employer]

## 2013-09-28 NOTE — Telephone Encounter (Signed)
Call in #10

## 2013-09-29 ENCOUNTER — Telehealth: Payer: Self-pay | Admitting: Neurology

## 2013-09-29 NOTE — Telephone Encounter (Signed)
Message copied by Annamaria Helling on Tue Sep 29, 2013  9:55 AM ------      Message from: TAT, [place]: Tue Sep 29, 2013  9:31 AM       Yes.        ----- Message -----         From: Annamaria Helling, CMA         Sent: 09/29/2013   9:16 AM           To: Eustace Quail Tat, DO            Looks like his MR confirmed Parkinson's. Okay for me to call him?       ------

## 2013-09-29 NOTE — Telephone Encounter (Signed)
I called in script 

## 2013-09-29 NOTE — Telephone Encounter (Signed)
Patient made aware of MR results. He had questions about numbness and tingling in hands and feet and asked if this was related to the Mirapex. I confirmed with Dr Tat that it is not. He was also wondering if he will not get rigidity because he has a tremor. I advised that you can develop both. He has a list of questions for Dr Tat he will bring to his next appt to discuss with her in the office.

## 2013-11-18 ENCOUNTER — Encounter: Payer: Self-pay | Admitting: Neurology

## 2013-11-18 ENCOUNTER — Ambulatory Visit (INDEPENDENT_AMBULATORY_CARE_PROVIDER_SITE_OTHER): Admitting: Neurology

## 2013-11-18 VITALS — BP 122/84 | HR 88 | Resp 20 | Ht 71.0 in | Wt 184.0 lb

## 2013-11-18 DIAGNOSIS — G20 Parkinson's disease: Secondary | ICD-10-CM

## 2013-11-18 DIAGNOSIS — R251 Tremor, unspecified: Secondary | ICD-10-CM

## 2013-11-18 DIAGNOSIS — R259 Unspecified abnormal involuntary movements: Secondary | ICD-10-CM

## 2013-11-18 MED ORDER — PRAMIPEXOLE DIHYDROCHLORIDE ER 1.5 MG PO TB24: 1.0000 | ORAL_TABLET | Freq: Every day | ORAL | Status: DC

## 2013-11-18 NOTE — Progress Notes (Signed)
[name] [name] was seen today in the movement disorders clinic for neurologic consultation at the request of FRY,[name], MD.  The consultation is for the evaluation of tremor.  Tremor started 1 year ago, but it has been getting worse since October.  The pt is R hand dominant.  The pt has noted worsening.  At first, it was positional and intermittent but that has changed.  It is more constant now.  He notices it at rest.  He can make it stop for a short period of time by concentrating on it.    11/18/13 update:  Pt presents today for follow up, accompanied by his wife who supplements the history.  She was not present last visit.  Pt states that he is doing better on the Mirapex.  He feels that he is about 98% better.  He denies SE with the medication.  No compulsive behaviors are noted.  Feeling well.  Exercising.  No falls.  No hallucinations.  Tremor feels like it is just under the surface but not visible.  No lightheadedness.    Neuroimaging has not previously been performed.   PREVIOUS MEDICATIONS: none to date  ALLERGIES:   Allergies  Allergen Reactions  . Penicillins Rash    CURRENT MEDICATIONS:  Current Outpatient Prescriptions on File Prior to Visit  Medication Sig Dispense Refill  . atorvastatin (LIPITOR) 20 MG tablet TAKE 1 TABLET DAILY  90 tablet  1  . beta carotene w/minerals (OCUVITE) tablet Take 1 tablet by mouth daily.      . Multiple Vitamin (MULTIVITAMIN) tablet Take 1 tablet by mouth daily.        . polyethylene glycol (MIRALAX / GLYCOLAX) packet Take 17 g by mouth daily.      . Probiotic Product (PROBIOTIC DAILY PO) Take 1 capsule by mouth daily.      . tamsulosin (FLOMAX) 0.4 MG CAPS capsule Take 2 capsules (0.8 mg total) by mouth daily.  180 capsule  3  . temazepam (RESTORIL) 30 MG capsule Take 1 capsule (30 mg total) by mouth at bedtime as needed for sleep.  90 capsule  1   No current facility-administered medications on file prior to visit.    PAST  MEDICAL HISTORY:   Past Medical History  Diagnosis Date  . Hyperlipidemia   . BPH with urinary obstruction   . Insomnia   . GERD (gastroesophageal reflux disease)   . Barrett's esophagus     sees Dr. Delfin Edis   . History of hiatal hernia   . Arthritis   . Chronic kidney disease     kidney stones, sees Dr. Raynelle Bring   . Diverticulosis   . Esophageal dysmotility   . RMSF [facility] spotted fever) 2012    PAST SURGICAL HISTORY:   Past Surgical History  Procedure Laterality Date  . Cholecystectomy    . Tonsillectomy and adenoidectomy    . Esophagogastroduodenoscopy  2004    showed GERD and Barretts  . Colonoscopy  2004    diverticulosis, repeat in 10 yrs   . Hernia repair      right inguinal  . Nissen fundoplication  10-23-79    laparoscopic per Dr. Johnathan Hausen  . Orchiopexy      right side at age 88   . Lipomas removed      x10/ on arms and legs  . Lasik      bilateral  . Upper gastrointestinal endoscopy      SOCIAL HISTORY:  History   Social History  . Marital Status: Married    Spouse Name: N/A    Number of Children: N/A  . Years of Education: N/A   Occupational History  . Not on file.   Social History Main Topics  . Smoking status: Never Smoker   . Smokeless tobacco: Never Used  . Alcohol Use: No  . Drug Use: No  . Sexual Activity: Not on file   Other Topics Concern  . Not on file   Social History Narrative  . No narrative on file    FAMILY HISTORY:   Family Status  Relation Status Death Age  . Mother Alive 83    rheumatoid arthritis  . Father Deceased 67    esophageal cancer  . Maternal Grandfather Deceased     esophagel cancer  . Paternal Grandmother Deceased   . Paternal Grandfather Deceased   . Sister Alive   . Brother Alive   . Daughter Alive   . Maternal Grandmother Deceased   . Sister Alive   . Sister Alive   . Sister Alive   . Daughter Alive     ROS:  A complete 10 system review of systems was obtained and  was unremarkable apart from what is mentioned above.  PHYSICAL EXAMINATION:    VITALS:   Filed Vitals:   11/18/13 1105  BP: 122/84  Pulse: 88  Resp: 20  Height: 5\' 11"  (1.803 m)  Weight: 184 lb (83.462 kg)    GEN:  The patient appears stated age and is in NAD. HEENT:  Normocephalic, atraumatic.  The mucous membranes are moist. The superficial temporal arteries are without ropiness or tenderness. CV:  RRR Lungs:  CTAB Neck/HEME:  There are no carotid bruits bilaterally.  Neurological examination:  Orientation: The patient is alert and oriented x3. Fund of knowledge is appropriate.  Recent and remote memory are intact.  Attention and concentration are normal.    Able to name objects and repeat phrases. Cranial nerves: There is good facial symmetry. Pupils are equal round and reactive to light bilaterally. Fundoscopic exam reveals clear margins bilaterally. Extraocular muscles are intact. The visual fields are full to confrontational testing. The speech is fluent and clear. Soft palate rises symmetrically and there is no tongue deviation. Hearing is intact to conversational tone. Sensation: Sensation is intact to light touch throughout. Motor: Strength is 5/5 in the bilateral upper and lower extremities.   Shoulder shrug is equal and symmetric.  There is no pronator drift.  No fasciculations.  Movement examination: Tone: There is mild increased tone with activation but it is otherwise normal Abnormal movements: tremor can be felt with distraction procedures but no longer can be seen Coordination:  There is no decremation with RAMs today (improvement).   Gait and Station: The patient has no difficulty arising out of a deep-seated chair without the use of the hands. The patient's stride length is normal with minor decreased arm swing on the R.  The patient has a negative pull test.      LABS  Lab Results  Component Value Date   TSH 0.93 05/21/2013     Chemistry      Component Value  Date/Time   NA 139 05/21/2013 0908   K 4.8 05/21/2013 0908   CL 103 05/21/2013 0908   CO2 29 05/21/2013 0908   BUN 11 05/21/2013 0908   CREATININE 0.5 05/21/2013 0908      Component Value Date/Time   CALCIUM 9.4 05/21/2013 0908  ALKPHOS 65 05/21/2013 0908   AST 27 05/21/2013 0908   ALT 29 05/21/2013 0908   BILITOT 3.5* 05/21/2013 0908       ASSESSMENT/PLAN:  1. Parkinsons disease, Hoehn and Yahr stage 1.  -Pt brought list of 2 pages of questions and I answered them to the best of my ability today.  -Wife wasn't present last visit so spent greater than 50% of 40 min visit in pt education discussion prognosis, safety, treatments, and importance of CV exercise.  -Doing well on mirpapex ER - 1.5 mg daily and we will continue that.  Risks, benefits, side effects and alternative therapies were discussed.  The opportunity to ask questions was given and they were answered to the best of my ability.  The patient expressed understanding and willingness to follow the outlined treatment protocols. 2.  Return in about 3 months (around 02/18/2014).

## 2013-11-27 ENCOUNTER — Other Ambulatory Visit: Payer: Self-pay | Admitting: Internal Medicine

## 2013-12-15 ENCOUNTER — Ambulatory Visit (INDEPENDENT_AMBULATORY_CARE_PROVIDER_SITE_OTHER): Admitting: Family Medicine

## 2013-12-15 ENCOUNTER — Encounter: Payer: Self-pay | Admitting: Family Medicine

## 2013-12-15 VITALS — BP 164/91 | HR 81 | Temp 98.0°F | Ht 71.0 in | Wt 196.0 lb

## 2013-12-15 DIAGNOSIS — N390 Urinary tract infection, site not specified: Secondary | ICD-10-CM

## 2013-12-15 LAB — POCT URINALYSIS DIPSTICK
Bilirubin, UA: NEGATIVE
Blood, UA: NEGATIVE
Glucose, UA: NEGATIVE
KETONES UA: NEGATIVE
LEUKOCYTES UA: NEGATIVE
Nitrite, UA: NEGATIVE
Spec Grav, UA: >=1.03
Urobilinogen, UA: 0.2
pH, UA: 6

## 2013-12-15 MED ORDER — CIPROFLOXACIN HCL 500 MG PO TABS: 500.0000 mg | ORAL_TABLET | Freq: Two times a day (BID) | ORAL | Status: DC

## 2013-12-15 NOTE — Progress Notes (Signed)
   Subjective:    Patient ID: [name] [name], male    DOB: 10-14-1950, 63 y.o.   MRN: [id number]  HPI Here for what he thinks is an early UTI. He has a hx of kidney stones and in fact he passed a small one at home about 3 days ago. This started with some left sided low back pain and then some urethral pain. Since passing the stone he still feels a slight burning sensation when he urinates. No fever, no blood in the urine.    Review of Systems  Constitutional: Negative.   Gastrointestinal: Negative.   Genitourinary: Positive for dysuria. Negative for urgency, frequency, hematuria, flank pain and difficulty urinating.       Objective:   Physical Exam  Constitutional: He appears well-developed and well-nourished.  Abdominal: Soft. Bowel sounds are normal. He exhibits no distension and no mass. There is no tenderness. There is no rebound and no guarding.          Assessment & Plan:  He has passed a stone and probably is on the verge of a UTI. Treat  with Cipro. He is drinking plenty of water

## 2013-12-15 NOTE — Addendum Note (Signed)
Addended by: Aggie Hacker A on: 12/15/2013 10:43 AM   Modules accepted: Orders

## 2013-12-15 NOTE — Progress Notes (Signed)
Pre visit review using our clinic review tool, if applicable. No additional management support is needed unless otherwise documented below in the visit note. 

## 2013-12-23 ENCOUNTER — Telehealth: Payer: Self-pay | Admitting: Family Medicine

## 2013-12-23 MED ORDER — CIPROFLOXACIN HCL 500 MG PO TABS: 500.0000 mg | ORAL_TABLET | Freq: Two times a day (BID) | ORAL | Status: DC

## 2013-12-23 NOTE — Telephone Encounter (Signed)
Call in Cipro 500 mg bid #20  

## 2013-12-23 NOTE — Telephone Encounter (Signed)
I sent script e-scribe and left a message.

## 2013-12-23 NOTE — Telephone Encounter (Signed)
Pt is needing to extend his rx ciprofloxacin (CIPRO) 500 MG tablet for 10 more days. Pt states dr. Sarajane Jews informed him to call if he needed more. Pt states the infection is better but not completely gone. [facility] to [facility] [address] [employer]

## 2013-12-29 ENCOUNTER — Ambulatory Visit: Admitting: Family Medicine

## 2014-01-01 ENCOUNTER — Encounter: Payer: Self-pay | Admitting: Family Medicine

## 2014-01-01 ENCOUNTER — Ambulatory Visit (INDEPENDENT_AMBULATORY_CARE_PROVIDER_SITE_OTHER): Admitting: Family Medicine

## 2014-01-01 VITALS — BP 136/85 | HR 77 | Temp 98.0°F | Ht 71.0 in | Wt 194.0 lb

## 2014-01-01 DIAGNOSIS — N390 Urinary tract infection, site not specified: Secondary | ICD-10-CM

## 2014-01-01 LAB — POCT URINALYSIS DIPSTICK
Bilirubin, UA: NEGATIVE
Glucose, UA: NEGATIVE
Ketones, UA: NEGATIVE
Nitrite, UA: NEGATIVE
Spec Grav, UA: 1.02
UROBILINOGEN UA: 0.2
pH, UA: 6

## 2014-01-01 MED ORDER — CIPROFLOXACIN HCL 500 MG PO TABS: 500.0000 mg | ORAL_TABLET | Freq: Two times a day (BID) | ORAL | Status: DC

## 2014-01-01 NOTE — Progress Notes (Signed)
   Subjective:    Patient ID: [name] [name], male    DOB: May 23, 1951, 63 y.o.   MRN: [id number]  HPI Here to follow up a UTI. He finished a 10 day course of Cipro and felt great, but then he got dehydrated by spending a day outside in the heat and not drinking much fluids. That night the urgency and discomfort returned. Now he is drinking plenty of fluids, and he is on another round of Cipro. He feels back to normal again. He has not passed any more stones.    Review of Systems  Constitutional: Negative.   Genitourinary: Negative for dysuria, urgency, frequency, hematuria, flank pain, difficulty urinating and testicular pain.       Objective:   Physical Exam  Constitutional: He appears well-developed and well-nourished.  Abdominal: Soft. Bowel sounds are normal. He exhibits no distension and no mass. There is no tenderness. There is no rebound and no guarding.          Assessment & Plan:  He seems to be on the right track. We will give him enough Cipro to complete a 30 day course. Recheck prn

## 2014-01-01 NOTE — Addendum Note (Signed)
Addended by: Aggie Hacker A on: 01/01/2014 10:05 AM   Modules accepted: Orders

## 2014-01-01 NOTE — Progress Notes (Signed)
Pre visit review using our clinic review tool, if applicable. No additional management support is needed unless otherwise documented below in the visit note. 

## 2014-01-20 ENCOUNTER — Other Ambulatory Visit: Payer: Self-pay | Admitting: Family Medicine

## 2014-02-17 ENCOUNTER — Ambulatory Visit (INDEPENDENT_AMBULATORY_CARE_PROVIDER_SITE_OTHER): Admitting: Neurology

## 2014-02-17 ENCOUNTER — Encounter: Payer: Self-pay | Admitting: Neurology

## 2014-02-17 VITALS — BP 110/76 | HR 74 | Ht 71.0 in | Wt 192.0 lb

## 2014-02-17 DIAGNOSIS — G20 Parkinson's disease: Secondary | ICD-10-CM

## 2014-02-17 MED ORDER — PRAMIPEXOLE DIHYDROCHLORIDE ER 1.5 MG PO TB24: 1.0000 | ORAL_TABLET | Freq: Every day | ORAL | Status: DC

## 2014-02-17 NOTE — Progress Notes (Signed)
[name] [name] was seen today in the movement disorders clinic for neurologic consultation at the request of FRY,[name], MD.  The consultation is for the evaluation of tremor.  Tremor started 1 year ago, but it has been getting worse since October.  The pt is R hand dominant.  The pt has noted worsening.  At first, it was positional and intermittent but that has changed.  It is more constant now.  He notices it at rest.  He can make it stop for a short period of time by concentrating on it.    11/18/13 update:  Pt presents today for follow up, accompanied by his wife who supplements the history.  She was not present last visit.  Pt states that he is doing better on the Mirapex.  He feels that he is about 98% better.  He denies SE with the medication.  No compulsive behaviors are noted.  Feeling well.  Exercising.  No falls.  No hallucinations.  Tremor feels like it is just under the surface but not visible.  No lightheadedness.    02/17/14 update:  Pt is f/u today regarding PD.  He is feeling great on mirapex er, 1.5 mg daily.  Feels sleepy if he relaxes but isn't able to sleep if he tries.  Pt states that he rarely feels/sees tremor.  Balance has been good.  Exercising faithfully.  Has had several kidney stones and asks if related to mirapex.  No hallucinations.  No lightheadedness.    Neuroimaging has not previously been performed.   PREVIOUS MEDICATIONS: none to date  ALLERGIES:   Allergies  Allergen Reactions  . Penicillins Rash    CURRENT MEDICATIONS:  Current Outpatient Prescriptions on File Prior to Visit  Medication Sig Dispense Refill  . atorvastatin (LIPITOR) 20 MG tablet TAKE 1 TABLET DAILY  90 tablet  0  . beta carotene w/minerals (OCUVITE) tablet Take 1 tablet by mouth daily.      . polyethylene glycol (MIRALAX / GLYCOLAX) packet Take 17 g by mouth daily.      . Probiotic Product (PROBIOTIC DAILY PO) Take 1 capsule by mouth daily.      . tamsulosin (FLOMAX) 0.4 MG CAPS  capsule Take 2 capsules (0.8 mg total) by mouth daily.  180 capsule  3  . temazepam (RESTORIL) 30 MG capsule Take 1 capsule (30 mg total) by mouth at bedtime as needed for sleep.  90 capsule  1   No current facility-administered medications on file prior to visit.    PAST MEDICAL HISTORY:   Past Medical History  Diagnosis Date  . Hyperlipidemia   . BPH with urinary obstruction   . Insomnia   . GERD (gastroesophageal reflux disease)   . Barrett's esophagus     sees Dr. Delfin Edis   . History of hiatal hernia   . Arthritis   . Chronic kidney disease     kidney stones, sees Dr. Raynelle Bring   . Diverticulosis   . Esophageal dysmotility   . RMSF [facility] spotted fever) 2012    PAST SURGICAL HISTORY:   Past Surgical History  Procedure Laterality Date  . Cholecystectomy    . Tonsillectomy and adenoidectomy    . Esophagogastroduodenoscopy  2004    showed GERD and Barretts  . Colonoscopy  2004    diverticulosis, repeat in 10 yrs   . Hernia repair      right inguinal  . Nissen fundoplication  4-62-70    laparoscopic per  Dr. Johnathan Hausen  . Orchiopexy      right side at age 89   . Lipomas removed      x10/ on arms and legs  . Lasik      bilateral  . Upper gastrointestinal endoscopy      SOCIAL HISTORY:   History   Social History  . Marital Status: Married    Spouse Name: N/A    Number of Children: N/A  . Years of Education: N/A   Occupational History  . Not on file.   Social History Main Topics  . Smoking status: Never Smoker   . Smokeless tobacco: Never Used  . Alcohol Use: No  . Drug Use: No  . Sexual Activity: Not on file   Other Topics Concern  . Not on file   Social History Narrative  . No narrative on file    FAMILY HISTORY:   Family Status  Relation Status Death Age  . Mother Alive 36    rheumatoid arthritis  . Father Deceased 91    esophageal cancer  . Maternal Grandfather Deceased     esophagel cancer  . Paternal Grandmother  Deceased   . Paternal Grandfather Deceased   . Sister Alive   . Brother Alive   . Daughter Alive   . Maternal Grandmother Deceased   . Sister Alive   . Sister Alive   . Sister Alive   . Daughter Alive     ROS:  A complete 10 system review of systems was obtained and was unremarkable apart from what is mentioned above.  PHYSICAL EXAMINATION:    VITALS:   Filed Vitals:   02/17/14 1055  BP: 110/76  Pulse: 74  Height: 5\' 11"  (1.803 m)  Weight: 192 lb (87.091 kg)    GEN:  The patient appears stated age and is in NAD. HEENT:  Normocephalic, atraumatic.  The mucous membranes are moist. The superficial temporal arteries are without ropiness or tenderness. CV:  RRR Lungs:  CTAB Neck/HEME:  There are no carotid bruits bilaterally.  Neurological examination:  Orientation: The patient is alert and oriented x3. Fund of knowledge is appropriate.  Recent and remote memory are intact.  Attention and concentration are normal.    Able to name objects and repeat phrases. Cranial nerves: There is good facial symmetry. Pupils are equal round and reactive to light bilaterally. Fundoscopic exam reveals clear margins bilaterally. Extraocular muscles are intact. The visual fields are full to confrontational testing. The speech is fluent and clear. Soft palate rises symmetrically and there is no tongue deviation. Hearing is intact to conversational tone. Sensation: Sensation is intact to light touch throughout. Motor: Strength is 5/5 in the bilateral upper and lower extremities.   Shoulder shrug is equal and symmetric.  There is no pronator drift.  No fasciculations.  Movement examination: Tone: There is normal tone bilaterally today Abnormal movements: no tremor noted today Coordination:  There is no decremation with RAMs today  Gait and Station: The patient has no difficulty arising out of a deep-seated chair without the use of the hands. The patient's stride length is normal with good arm swing  today.  LABS  Lab Results  Component Value Date   TSH 0.93 05/21/2013     Chemistry      Component Value Date/Time   NA 139 05/21/2013 0908   K 4.8 05/21/2013 0908   CL 103 05/21/2013 0908   CO2 29 05/21/2013 0908   BUN 11 05/21/2013 0908  CREATININE 0.5 05/21/2013 0908      Component Value Date/Time   CALCIUM 9.4 05/21/2013 0908   ALKPHOS 65 05/21/2013 0908   AST 27 05/21/2013 0908   ALT 29 05/21/2013 0908   BILITOT 3.5* 05/21/2013 0908       ASSESSMENT/PLAN:  1. Parkinsons disease, Hoehn and Yahr stage 1.  -Doing well on mirpapex ER - 1.5 mg daily and we will continue that.  Risks, benefits, side effects and alternative therapies were discussed.  The opportunity to ask questions was given and they were answered to the best of my ability.  The patient expressed understanding and willingness to follow the outlined treatment protocols.  -continue with faithful exercise.  Talked about emerging therapies and safety with PD.  Greater than 50% of 25 min visit in counseling. 2.  Return in about 4 months (around 06/19/2014).

## 2014-02-18 ENCOUNTER — Telehealth: Payer: Self-pay | Admitting: Family Medicine

## 2014-02-18 NOTE — Telephone Encounter (Signed)
Pt is needing new rx promethazine 25 mg suppository, pt states dr. Laretta Bolster use to write the rx. [facility] to [facility] [address] [employer] [phone]

## 2014-02-19 MED ORDER — PROMETHAZINE HCL 25 MG RE SUPP: 25.0000 mg | RECTAL | Status: DC | PRN

## 2014-02-19 NOTE — Telephone Encounter (Signed)
I sent script e-scribe and left a message for pt. 

## 2014-02-19 NOTE — Telephone Encounter (Signed)
Call these in to use every 4 hours prn nausea, #60 with 11 rf

## 2014-03-10 ENCOUNTER — Telehealth: Payer: Self-pay | Admitting: Family Medicine

## 2014-03-10 NOTE — Telephone Encounter (Signed)
Refill request for Temazepam 30 mg and send to [facility].

## 2014-03-11 MED ORDER — TEMAZEPAM 30 MG PO CAPS: 30.0000 mg | ORAL_CAPSULE | Freq: Every evening | ORAL | Status: DC | PRN

## 2014-03-11 NOTE — Telephone Encounter (Signed)
Script was faxed.

## 2014-03-11 NOTE — Telephone Encounter (Signed)
Ready to fax  

## 2014-04-02 ENCOUNTER — Other Ambulatory Visit: Payer: Self-pay | Admitting: Family Medicine

## 2014-04-14 ENCOUNTER — Telehealth: Payer: Self-pay | Admitting: Family Medicine

## 2014-04-14 NOTE — Telephone Encounter (Signed)
Pt called to say he think he has UTI because he has passed some stones. Pt said Cepro did not work last time would like to try something else. He said he did see a Dealer but was told to see his pcp for this [place]; [facility] [address] [facility] [place]

## 2014-04-15 MED ORDER — SULFAMETHOXAZOLE-TMP DS 800-160 MG PO TABS: 1.0000 | ORAL_TABLET | Freq: Two times a day (BID) | ORAL | Status: DC

## 2014-04-15 NOTE — Telephone Encounter (Signed)
Call in Bactrim DS to take bid for 10 days

## 2014-04-15 NOTE — Telephone Encounter (Signed)
I sent script e-scribe and spoke with pt. 

## 2014-04-20 ENCOUNTER — Other Ambulatory Visit: Payer: Self-pay | Admitting: Internal Medicine

## 2014-05-04 ENCOUNTER — Ambulatory Visit (INDEPENDENT_AMBULATORY_CARE_PROVIDER_SITE_OTHER): Admitting: Family Medicine

## 2014-05-04 ENCOUNTER — Encounter: Payer: Self-pay | Admitting: Family Medicine

## 2014-05-04 VITALS — BP 137/86 | HR 77 | Temp 98.1°F | Ht 71.0 in | Wt 196.0 lb

## 2014-05-04 DIAGNOSIS — R319 Hematuria, unspecified: Secondary | ICD-10-CM

## 2014-05-04 DIAGNOSIS — N390 Urinary tract infection, site not specified: Secondary | ICD-10-CM

## 2014-05-04 LAB — POCT URINALYSIS DIPSTICK
Bilirubin, UA: NEGATIVE
Blood, UA: NEGATIVE
Glucose, UA: NEGATIVE
KETONES UA: NEGATIVE
Nitrite, UA: NEGATIVE
PH UA: 6.5
SPEC GRAV UA: 1.015
Urobilinogen, UA: 0.2

## 2014-05-04 MED ORDER — SULFAMETHOXAZOLE-TMP DS 800-160 MG PO TABS
1.0000 | ORAL_TABLET | Freq: Two times a day (BID) | ORAL | Status: DC
Start: 2014-05-04 — End: 2014-05-31

## 2014-05-04 NOTE — Progress Notes (Signed)
Pre visit review using our clinic review tool, if applicable. No additional management support is needed unless otherwise documented below in the visit note. 

## 2014-05-04 NOTE — Addendum Note (Signed)
Addended by: Aggie Hacker A on: 05/04/2014 12:10 PM   Modules accepted: Orders

## 2014-05-04 NOTE — Progress Notes (Signed)
   Subjective:    Patient ID: [name] [name], male    DOB: September 28, 1950, 63 y.o.   MRN: [id number]  HPI Here for continued UTI sx including urgency to urinate and burning. He took a course of Bactrim several weeks ago and this helped immediately, however the sx have returned. No fever. He is still passing small stones intermittently. He was started on HCTZ by Dr. Alinda Money to lower his urinary calcium levels.    Review of Systems  Constitutional: Negative.   Genitourinary: Positive for dysuria, urgency, frequency and hematuria. Negative for flank pain and testicular pain.       Objective:   Physical Exam  Constitutional: He appears well-developed and well-nourished.  Abdominal: Soft. Bowel sounds are normal. He exhibits no distension and no mass. There is no tenderness. There is no rebound and no guarding.          Assessment & Plan:  Partially treated UTI, probably a prostatitis. Given a full 30 day course of Bactrim DS.

## 2014-05-05 LAB — URINE CULTURE
Colony Count: NO GROWTH
Organism ID, Bacteria: NO GROWTH

## 2014-05-24 ENCOUNTER — Other Ambulatory Visit (INDEPENDENT_AMBULATORY_CARE_PROVIDER_SITE_OTHER)

## 2014-05-24 DIAGNOSIS — Z0000 Encounter for general adult medical examination without abnormal findings: Secondary | ICD-10-CM

## 2014-05-24 LAB — PSA: PSA: 1.43 ng/mL (ref 0.10–4.00)

## 2014-05-24 LAB — POCT URINALYSIS DIPSTICK
Bilirubin, UA: NEGATIVE
Blood, UA: NEGATIVE
Glucose, UA: NEGATIVE
Ketones, UA: NEGATIVE
LEUKOCYTES UA: NEGATIVE
NITRITE UA: NEGATIVE
PH UA: 5.5
PROTEIN UA: NEGATIVE
Spec Grav, UA: 1.015
Urobilinogen, UA: 0.2

## 2014-05-24 LAB — LIPID PANEL
CHOLESTEROL: 160 mg/dL (ref 0–200)
HDL: 38.6 mg/dL — ABNORMAL LOW (ref >39.00)
LDL Cholesterol: 93 mg/dL (ref 0–99)
NONHDL: 121.4
Total CHOL/HDL Ratio: 4
Triglycerides: 142 mg/dL (ref 0.0–149.0)
VLDL: 28.4 mg/dL (ref 0.0–40.0)

## 2014-05-24 LAB — HEPATIC FUNCTION PANEL
ALBUMIN: 3.9 g/dL (ref 3.5–5.2)
ALT: 25 U/L (ref 0–53)
AST: 25 U/L (ref 0–37)
Alkaline Phosphatase: 64 U/L (ref 39–117)
Bilirubin, Direct: 0.3 mg/dL (ref 0.0–0.3)
Total Bilirubin: 3 mg/dL — ABNORMAL HIGH (ref 0.2–1.2)
Total Protein: 7.1 g/dL (ref 6.0–8.3)

## 2014-05-24 LAB — BASIC METABOLIC PANEL
BUN: 16 mg/dL (ref 6–23)
CO2: 24 meq/L (ref 19–32)
CREATININE: 1 mg/dL (ref 0.4–1.5)
Calcium: 9.8 mg/dL (ref 8.4–10.5)
Chloride: 101 mEq/L (ref 96–112)
GFR: 79.42 mL/min (ref >60.00)
GLUCOSE: 108 mg/dL — AB (ref 70–99)
Potassium: 4.8 mEq/L (ref 3.5–5.1)
SODIUM: 140 meq/L (ref 135–145)

## 2014-05-24 LAB — CBC WITH DIFFERENTIAL/PLATELET
Basophils Absolute: 0 10*3/uL (ref 0.0–0.1)
Basophils Relative: 0.6 % (ref 0.0–3.0)
EOS ABS: 0.2 10*3/uL (ref 0.0–0.7)
Eosinophils Relative: 3 % (ref 0.0–5.0)
HCT: 48.7 % (ref 39.0–52.0)
HEMOGLOBIN: 16.5 g/dL (ref 13.0–17.0)
Lymphocytes Relative: 22.2 % (ref 12.0–46.0)
Lymphs Abs: 1.1 10*3/uL (ref 0.7–4.0)
MCHC: 34 g/dL (ref 30.0–36.0)
MCV: 94.3 fl (ref 78.0–100.0)
MONO ABS: 0.4 10*3/uL (ref 0.1–1.0)
Monocytes Relative: 8.3 % (ref 3.0–12.0)
NEUTROS ABS: 3.4 10*3/uL (ref 1.4–7.7)
Neutrophils Relative %: 65.9 % (ref 43.0–77.0)
Platelets: 223 10*3/uL (ref 150.0–400.0)
RBC: 5.17 Mil/uL (ref 4.22–5.81)
RDW: 13.2 % (ref 11.5–15.5)
WBC: 5.2 10*3/uL (ref 4.0–10.5)

## 2014-05-24 LAB — TSH: TSH: 1.66 u[IU]/mL (ref 0.35–4.50)

## 2014-05-31 ENCOUNTER — Encounter: Payer: Self-pay | Admitting: Family Medicine

## 2014-05-31 ENCOUNTER — Ambulatory Visit (INDEPENDENT_AMBULATORY_CARE_PROVIDER_SITE_OTHER): Admitting: Family Medicine

## 2014-05-31 VITALS — BP 124/79 | HR 89 | Temp 97.9°F | Ht 71.0 in | Wt 190.0 lb

## 2014-05-31 DIAGNOSIS — Z0000 Encounter for general adult medical examination without abnormal findings: Secondary | ICD-10-CM

## 2014-05-31 MED ORDER — HYDROCHLOROTHIAZIDE 12.5 MG PO CAPS: 12.5000 mg | ORAL_CAPSULE | Freq: Every day | ORAL | Status: DC

## 2014-05-31 MED ORDER — TEMAZEPAM 30 MG PO CAPS: 30.0000 mg | ORAL_CAPSULE | Freq: Every evening | ORAL | Status: DC | PRN

## 2014-05-31 MED ORDER — ATORVASTATIN CALCIUM 20 MG PO TABS: ORAL_TABLET | ORAL | Status: DC

## 2014-05-31 MED ORDER — CIPROFLOXACIN HCL 500 MG PO TABS: 500.0000 mg | ORAL_TABLET | Freq: Two times a day (BID) | ORAL | Status: DC

## 2014-05-31 MED ORDER — TAMSULOSIN HCL 0.4 MG PO CAPS: 0.8000 mg | ORAL_CAPSULE | Freq: Every day | ORAL | Status: DC

## 2014-05-31 NOTE — Progress Notes (Signed)
Pre visit review using our clinic review tool, if applicable. No additional management support is needed unless otherwise documented below in the visit note. 

## 2014-05-31 NOTE — Progress Notes (Signed)
   Subjective:    Patient ID: [name] [name], male    DOB: [birth year], 63 y.o.   MRN: [id number]  HPI 63 yr old male for a cpx. He feels well except for his ongoing prostatitis. He feels better after taking bactrim for 3 and 1/2 weeks, but he still has some burning and some urgency on urinating.    Review of Systems  Constitutional: Negative.   HENT: Negative.   Eyes: Negative.   Respiratory: Negative.   Cardiovascular: Negative.   Gastrointestinal: Negative.   Genitourinary: Positive for dysuria, urgency and difficulty urinating. Negative for frequency, hematuria, flank pain, discharge and testicular pain.  Musculoskeletal: Negative.   Skin: Negative.   Neurological: Negative.   Psychiatric/Behavioral: Negative.        Objective:   Physical Exam  Constitutional: He is oriented to person, place, and time. He appears well-developed and well-nourished. No distress.  HENT:  Head: Normocephalic and atraumatic.  Right Ear: External ear normal.  Left Ear: External ear normal.  Nose: Nose normal.  Mouth/Throat: Oropharynx is clear and moist. No oropharyngeal exudate.  Eyes: Conjunctivae and EOM are normal. Pupils are equal, round, and reactive to light. Right eye exhibits no discharge. Left eye exhibits no discharge. No scleral icterus.  Neck: Neck supple. No JVD present. No tracheal deviation present. No thyromegaly present.  Cardiovascular: Normal rate, regular rhythm, normal heart sounds and intact distal pulses.  Exam reveals no gallop and no friction rub.   No murmur heard. EKG normal   Pulmonary/Chest: Effort normal and breath sounds normal. No respiratory distress. He has no wheezes. He has no rales. He exhibits no tenderness.  Abdominal: Soft. Bowel sounds are normal. He exhibits no distension and no mass. There is no tenderness. There is no rebound and no guarding.  Musculoskeletal: Normal range of motion. He exhibits no edema or tenderness.  Lymphadenopathy:    He has  no cervical adenopathy.  Neurological: He is alert and oriented to person, place, and time. He has normal reflexes. No cranial nerve deficit. He exhibits normal muscle tone. Coordination normal.  Skin: Skin is warm and dry. No rash noted. He is not diaphoretic. No erythema. No pallor.  Psychiatric: He has a normal mood and affect. His behavior is normal. Judgment and thought content normal.          Assessment & Plan:  Well exam. We will switch from Bactrim to Cipro, and he will take this for 30 days.

## 2014-06-18 ENCOUNTER — Other Ambulatory Visit: Payer: Self-pay | Admitting: Family Medicine

## 2014-06-21 ENCOUNTER — Ambulatory Visit: Admitting: Neurology

## 2014-06-22 ENCOUNTER — Ambulatory Visit (INDEPENDENT_AMBULATORY_CARE_PROVIDER_SITE_OTHER): Admitting: Neurology

## 2014-06-22 ENCOUNTER — Encounter: Payer: Self-pay | Admitting: Neurology

## 2014-06-22 VITALS — BP 132/72 | HR 76 | Ht 71.0 in | Wt 191.0 lb

## 2014-06-22 DIAGNOSIS — G20 Parkinson's disease: Secondary | ICD-10-CM

## 2014-06-22 DIAGNOSIS — R110 Nausea: Secondary | ICD-10-CM

## 2014-06-22 NOTE — Progress Notes (Signed)
[name] [name] was seen today in the movement disorders clinic for neurologic consultation at the request of FRY,[name], MD.  The consultation is for the evaluation of tremor.  Tremor started 1 year ago, but it has been getting worse since October.  The pt is R hand dominant.  The pt has noted worsening.  At first, it was positional and intermittent but that has changed.  It is more constant now.  He notices it at rest.  He can make it stop for a short period of time by concentrating on it.    11/18/13 update:  Pt presents today for follow up, accompanied by his wife who supplements the history.  She was not present last visit.  Pt states that he is doing better on the Mirapex.  He feels that he is about 98% better.  He denies SE with the medication.  No compulsive behaviors are noted.  Feeling well.  Exercising.  No falls.  No hallucinations.  Tremor feels like it is just under the surface but not visible.  No lightheadedness.    02/17/14 update:  Pt is f/u today regarding PD.  He is feeling great on mirapex er, 1.5 mg daily.  Feels sleepy if he relaxes but isn't able to sleep if he tries.  Pt states that he rarely feels/sees tremor.  Balance has been good.  Exercising faithfully.  Has had several kidney stones and asks if related to mirapex.  No hallucinations.  No lightheadedness.    06/22/14 update:  Pt is f/u today re: PD.  Doing well on mirapex ER 1.5 mg daily.  Good balance.  No falls.  No hallucinations. No compulsive behaviors.  Exercising a lot.  States that most day he wouldn't even know he had PD.   The records that were made available to me were reviewed.  Been on several abx for prostatitis.  Saw that he called pcp for phenergan suppositories but states that he only uses it rarely as he had a nissen fundoplication.  He has only used one.  Had derm appt and no evidence of CA.  Neuroimaging has not previously been performed.   PREVIOUS MEDICATIONS: none to date  ALLERGIES:     Allergies  Allergen Reactions  . Penicillins Rash    CURRENT MEDICATIONS:  Current Outpatient Prescriptions on File Prior to Visit  Medication Sig Dispense Refill  . atorvastatin (LIPITOR) 20 MG tablet TAKE 1 TABLET DAILY 90 tablet 3  . beta carotene w/minerals (OCUVITE) tablet Take 1 tablet by mouth daily.    . ciprofloxacin (CIPRO) 500 MG tablet Take 1 tablet (500 mg total) by mouth 2 (two) times daily. 60 tablet 0  . hydrochlorothiazide (MICROZIDE) 12.5 MG capsule Take 1 capsule (12.5 mg total) by mouth daily. 90 capsule 3  . polyethylene glycol (MIRALAX / GLYCOLAX) packet Take 17 g by mouth daily.    . Pramipexole Dihydrochloride 1.5 MG TB24 Take 1 tablet (1.5 mg total) by mouth daily. 90 tablet 3  . Probiotic Product (PROBIOTIC DAILY PO) Take 1 capsule by mouth daily.    . tamsulosin (FLOMAX) 0.4 MG CAPS capsule TAKE 2 CAPSULES EVERY DAY 180 capsule 3  . temazepam (RESTORIL) 30 MG capsule Take 1 capsule (30 mg total) by mouth at bedtime as needed for sleep. 90 capsule 1   No current facility-administered medications on file prior to visit.    PAST MEDICAL HISTORY:   Past Medical History  Diagnosis Date  . Hyperlipidemia   .  BPH with urinary obstruction   . Insomnia   . GERD (gastroesophageal reflux disease)   . Barrett's esophagus     sees Dr. Delfin Edis   . History of hiatal hernia   . Arthritis   . Chronic kidney disease     kidney stones, sees Dr. Raynelle Bring   . Diverticulosis   . Esophageal dysmotility   . RMSF [facility] spotted fever) 2012    PAST SURGICAL HISTORY:   Past Surgical History  Procedure Laterality Date  . Cholecystectomy    . Tonsillectomy and adenoidectomy    . Esophagogastroduodenoscopy  06-24-13    per Dr. Olevia Perches, no signs of Barretts, repeat in 5 yrs   . Colonoscopy  06-24-13    per Dr. Olevia Perches, no polyps, diveticulosis only, repeat in 10 yrs   . Hernia repair      right inguinal  . Nissen fundoplication  12-11-39    laparoscopic  per Dr. Johnathan Hausen  . Orchiopexy      right side at age 70   . Lipomas removed      x10/ on arms and legs  . Lasik      bilateral    SOCIAL HISTORY:   History   Social History  . Marital Status: Married    Spouse Name: N/A    Number of Children: N/A  . Years of Education: N/A   Occupational History  . Not on file.   Social History Main Topics  . Smoking status: Never Smoker   . Smokeless tobacco: Never Used  . Alcohol Use: No  . Drug Use: No  . Sexual Activity: Not on file   Other Topics Concern  . Not on file   Social History Narrative    FAMILY HISTORY:   Family Status  Relation Status Death Age  . Mother Alive 56    rheumatoid arthritis  . Father Deceased 17    esophageal cancer  . Maternal Grandfather Deceased     esophagel cancer  . Paternal Grandmother Deceased   . Paternal Grandfather Deceased   . Sister Alive   . Brother Alive   . Daughter Alive   . Maternal Grandmother Deceased   . Sister Alive   . Sister Alive   . Sister Alive   . Daughter Alive     ROS:  A complete 10 system review of systems was obtained and was unremarkable apart from what is mentioned above.  PHYSICAL EXAMINATION:    VITALS:   Filed Vitals:   06/22/14 0946  BP: 132/72  Pulse: 76  Height: 5\' 11"  (1.803 m)  Weight: 191 lb (86.637 kg)    GEN:  The patient appears stated age and is in NAD. HEENT:  Normocephalic, atraumatic.  The mucous membranes are moist. The superficial temporal arteries are without ropiness or tenderness. CV:  RRR Lungs:  CTAB Neck/HEME:  There are no carotid bruits bilaterally.  Neurological examination:  Orientation: The patient is alert and oriented x3. Fund of knowledge is appropriate.  Recent and remote memory are intact.  Attention and concentration are normal.    Able to name objects and repeat phrases. Cranial nerves: There is good facial symmetry. Pupils are equal round and reactive to light bilaterally. Fundoscopic exam reveals  clear margins bilaterally. Extraocular muscles are intact. The visual fields are full to confrontational testing. The speech is fluent and clear. Soft palate rises symmetrically and there is no tongue deviation. Hearing is intact to conversational tone. Sensation: Sensation is  intact to light touch throughout. Motor: Strength is 5/5 in the bilateral upper and lower extremities.   Shoulder shrug is equal and symmetric.  There is no pronator drift.  No fasciculations.  Movement examination: Tone: There is normal tone bilaterally today Abnormal movements: no tremor noted today Coordination:  There is no decremation with RAMs today  Gait and Station: The patient has no difficulty arising out of a deep-seated chair without the use of the hands. The patient's stride length is normal with good arm swing today.  LABS  Lab Results  Component Value Date   TSH 1.66 05/24/2014     Chemistry      Component Value Date/Time   NA 140 05/24/2014 0935   K 4.8 05/24/2014 0935   CL 101 05/24/2014 0935   CO2 24 05/24/2014 0935   BUN 16 05/24/2014 0935   CREATININE 1.0 05/24/2014 0935      Component Value Date/Time   CALCIUM 9.8 05/24/2014 0935   ALKPHOS 64 05/24/2014 0935   AST 25 05/24/2014 0935   ALT 25 05/24/2014 0935   BILITOT 3.0* 05/24/2014 0935       ASSESSMENT/PLAN:  1. Parkinsons disease, Hoehn and Yahr stage 1.  -Doing well on mirpapex ER - 1.5 mg daily and we will continue that.  Risks, benefits, side effects and alternative therapies were discussed.  The opportunity to ask questions was given and they were answered to the best of my ability.  The patient expressed understanding and willingness to follow the outlined treatment protocols.  -continue with faithful exercise.  Talked about emerging therapies and safety with PD.  Greater than 50% of 25 min visit in counseling.  -Invited to March PD event but pt will be on vacation. 2.  Nausea  -explained to patient that I have no  objection to zofran but really don't want him using chronic phenergan as it can make PD sx's worse.  He has not used it but one time and I have no objection to that.   2.  Return in about 4 months (around 10/22/2014).

## 2014-07-01 ENCOUNTER — Other Ambulatory Visit: Payer: Self-pay | Admitting: Internal Medicine

## 2014-09-11 ENCOUNTER — Other Ambulatory Visit: Payer: Self-pay | Admitting: Internal Medicine

## 2014-10-22 ENCOUNTER — Ambulatory Visit: Admitting: Neurology

## 2014-10-25 ENCOUNTER — Encounter: Payer: Self-pay | Admitting: Neurology

## 2014-10-25 ENCOUNTER — Ambulatory Visit (INDEPENDENT_AMBULATORY_CARE_PROVIDER_SITE_OTHER): Admitting: Neurology

## 2014-10-25 VITALS — BP 130/72 | HR 80 | Ht 71.0 in | Wt 191.0 lb

## 2014-10-25 DIAGNOSIS — M129 Arthropathy, unspecified: Secondary | ICD-10-CM | POA: Diagnosis not present

## 2014-10-25 DIAGNOSIS — M19011 Primary osteoarthritis, right shoulder: Secondary | ICD-10-CM

## 2014-10-25 DIAGNOSIS — G20 Parkinson's disease: Secondary | ICD-10-CM

## 2014-10-25 NOTE — Progress Notes (Signed)
[name] [name] was seen today in the movement disorders clinic for neurologic consultation at the request of [name] [name],[name] A, MD.  The consultation is for the evaluation of tremor.  Tremor started 1 year ago, but it has been getting worse since October.  The pt is R hand dominant.  The pt has noted worsening.  At first, it was positional and intermittent but that has changed.  It is more constant now.  He notices it at rest.  He can make it stop for [name] short period of time by concentrating on it.    11/18/13 update:  Pt presents today for follow up, accompanied by his wife who supplements the history.  She was not present last visit.  Pt states that he is doing better on the Mirapex.  He feels that he is about 98% better.  He denies SE with the medication.  No compulsive behaviors are noted.  Feeling well.  Exercising.  No falls.  No hallucinations.  Tremor feels like it is just under the surface but not visible.  No lightheadedness.    02/17/14 update:  Pt is f/u today regarding PD.  He is feeling great on mirapex er, 1.5 mg daily.  Feels sleepy if he relaxes but isn't able to sleep if he tries.  Pt states that he rarely feels/sees tremor.  Balance has been good.  Exercising faithfully.  Has had several kidney stones and asks if related to mirapex.  No hallucinations.  No lightheadedness.    06/22/14 update:  Pt is f/u today re: PD.  Doing well on mirapex ER 1.5 mg daily.  Good balance.  No falls.  No hallucinations. No compulsive behaviors.  Exercising [name] lot.  States that most day he wouldn't even know he had PD.   The records that were made available to me were reviewed.  Been on several abx for prostatitis.  Saw that he called pcp for phenergan suppositories but states that he only uses it rarely as he had [name] nissen fundoplication.  He has only used one.  Had derm appt and no evidence of CA.  10/25/14 update:  Pt is accompanied by his wife who supplements the history.  He states that it is "time" to  replace the shoulder.  He has it scheduled but not until December 1.  He isn't sure if it isn't under general anesthesia.  He was prescribed phenergan but he almost never uses it (twice in three years).  He will have no tremor for days to weeks and then may have some tremor.  He has some EDS when he stops when he sits and relaxes.  States if he tried to take [name] nap, though, he won't be able to sleep.    He doesn't snore at night.  He does take temazepam for sleep at night.  He notices balance isn't quite as good as it used to be.  He hasn't fallen.   He exercises faithfully.  He asks about DBS therapy.  Neuroimaging has not previously been performed.   PREVIOUS MEDICATIONS: none to date  ALLERGIES:   Allergies  Allergen Reactions  . Penicillins Rash    CURRENT MEDICATIONS:  Current Outpatient Prescriptions on File Prior to Visit  Medication Sig Dispense Refill  . atorvastatin (LIPITOR) 20 MG tablet TAKE 1 TABLET DAILY 90 tablet 3  . hydrochlorothiazide (MICROZIDE) 12.5 MG capsule Take 1 capsule (12.5 mg total) by mouth daily. 90 capsule 3  . polyethylene glycol (MIRALAX / GLYCOLAX) packet  Take 17 g by mouth daily.    . Pramipexole Dihydrochloride 1.5 MG TB24 Take 1 tablet (1.5 mg total) by mouth daily. 90 tablet 3  . Probiotic Product (PROBIOTIC DAILY PO) Take 1 capsule by mouth daily.    . tamsulosin (FLOMAX) 0.4 MG CAPS capsule TAKE 2 CAPSULES EVERY DAY 180 capsule 3  . temazepam (RESTORIL) 30 MG capsule Take 1 capsule (30 mg total) by mouth at bedtime as needed for sleep. 90 capsule 1   No current facility-administered medications on file prior to visit.    PAST MEDICAL HISTORY:   Past Medical History  Diagnosis Date  . Hyperlipidemia   . BPH with urinary obstruction   . Insomnia   . GERD (gastroesophageal reflux disease)   . Barrett's esophagus     sees Dr. Delfin [name]   . History of hiatal hernia   . Arthritis   . Chronic kidney disease     kidney stones, sees Dr. Raynelle [name]   . Diverticulosis   . Esophageal dysmotility   . RMSF [facility] spotted fever) 2012    PAST SURGICAL HISTORY:   Past Surgical History  Procedure Laterality Date  . Cholecystectomy    . Tonsillectomy and adenoidectomy    . Esophagogastroduodenoscopy  06-24-13    per Dr. Olevia [name], no signs of Barretts, repeat in 5 yrs   . Colonoscopy  06-24-13    per Dr. Olevia [name], no polyps, diveticulosis only, repeat in 10 yrs   . Hernia repair      right inguinal  . Nissen fundoplication  1-91-47    laparoscopic per Dr. Johnathan [name]  . Orchiopexy      right side at age 57   . Lipomas removed      x10/ on arms and legs  . Lasik      bilateral    SOCIAL HISTORY:   History   Social History  . Marital Status: Married    Spouse Name: N/[name]  . Number of Children: N/[name]  . Years of Education: N/[name]   Occupational History  . Not on file.   Social History Main Topics  . Smoking status: Never Smoker   . Smokeless tobacco: Never Used  . Alcohol Use: No  . Drug Use: No  . Sexual Activity: Not on file   Other Topics Concern  . Not on file   Social History Narrative    FAMILY HISTORY:   Family Status  Relation Status Death Age  . Mother Alive 17    rheumatoid arthritis  . Father Deceased 18    esophageal cancer  . Maternal Grandfather Deceased     esophagel cancer  . Paternal Grandmother Deceased   . Paternal Grandfather Deceased   . Sister Alive   . Brother Alive   . Daughter Alive   . Maternal Grandmother Deceased   . Sister Alive   . Sister Alive   . Sister Alive   . Daughter Alive     ROS:  [name] complete 10 system review of systems was obtained and was unremarkable apart from what is mentioned above.  PHYSICAL EXAMINATION:    VITALS:   Filed Vitals:   10/25/14 1320  BP: 130/72  Pulse: 80  Height: 5\' 11"  (1.803 m)  Weight: 191 lb (86.637 kg)    GEN:  The patient appears stated age and is in NAD. HEENT:  Normocephalic, atraumatic.  The mucous membranes are  moist. The superficial temporal arteries are without ropiness or tenderness. CV:  RRR Lungs:  CTAB Neck/HEME:  There are no carotid bruits bilaterally.  Neurological examination:  Orientation: The patient is alert and oriented x3. Fund of knowledge is appropriate.  Recent and remote memory are intact.  Attention and concentration are normal.    Able to name objects and repeat phrases. Cranial nerves: There is good facial symmetry. Pupils are equal round and reactive to light bilaterally. Fundoscopic exam reveals clear margins bilaterally. Extraocular muscles are intact. The visual fields are full to confrontational testing. The speech is fluent and clear. Soft palate rises symmetrically and there is no tongue deviation. Hearing is intact to conversational tone. Sensation: Sensation is intact to light touch throughout. Motor: Strength is 5/5 in the bilateral upper and lower extremities.   Shoulder shrug is equal and symmetric.  There is no pronator drift.  No fasciculations.  Movement examination: Tone: There is normal tone bilaterally today Abnormal movements: no tremor noted today Coordination:  There is no decremation with RAMs today  Gait and Station: The patient has no difficulty arising out of [name] deep-seated chair without the use of the hands. The patient's stride length is normal with good arm swing today.  He has [name] negative pull test.  LABS  Lab Results  Component Value Date   TSH 1.66 05/24/2014     Chemistry      Component Value Date/Time   NA 140 05/24/2014 0935   K 4.8 05/24/2014 0935   CL 101 05/24/2014 0935   CO2 24 05/24/2014 0935   BUN 16 05/24/2014 0935   CREATININE 1.0 05/24/2014 0935      Component Value Date/Time   CALCIUM 9.8 05/24/2014 0935   ALKPHOS 64 05/24/2014 0935   AST 25 05/24/2014 0935   ALT 25 05/24/2014 0935   BILITOT 3.0* 05/24/2014 0935       ASSESSMENT/PLAN:  1. Parkinsons disease, Hoehn and Yahr stage 1.  -Doing well on mirpapex ER -  1.5 mg daily and we will continue that.  We talked about the fact that this could potentially be the source of some of his daytime sleepiness.  He really does not want to change the medication, nor does he want to even move it to bedtime dosing.  He is not falling asleep while driving.  He has had no evidence of sleep attacks.  Risks, benefits, side effects and alternative therapies were discussed.  The opportunity to ask questions was given and they were answered to the best of my ability.  The patient expressed understanding and willingness to follow the outlined treatment protocols.  -continue with faithful exercise.  Talked about emerging therapies and safety with PD.    -DBS therapy.  He is ready for this, nor clinically is indicated at this time.  We did, however, discuss new DBS guidelines from the FDA.  Greater than 50% of 25 min visit in counseling. 2.  Right shoulder osteoarthritis  -Planning to undergo total shoulder replacement in December, 2016.  Ideally, I would like to see this done under [name] local block, but he likely would do fine under general anesthesia.  He and I did talked about the risks associated with this, as it relates to Parkinson's disease. 3.  Return in about 4 months (around 02/24/2015).

## 2014-12-14 ENCOUNTER — Telehealth: Payer: Self-pay

## 2014-12-14 MED ORDER — TEMAZEPAM 30 MG PO CAPS: 30.0000 mg | ORAL_CAPSULE | Freq: Every evening | ORAL | Status: DC | PRN

## 2014-12-14 NOTE — Telephone Encounter (Signed)
I faxed script 

## 2014-12-14 NOTE — Telephone Encounter (Signed)
Ready to be faxed.

## 2014-12-14 NOTE — Telephone Encounter (Signed)
Temazepam 30mg   [facility]

## 2015-01-01 ENCOUNTER — Other Ambulatory Visit: Payer: Self-pay | Admitting: Neurology

## 2015-03-14 ENCOUNTER — Encounter: Payer: Self-pay | Admitting: Neurology

## 2015-03-14 ENCOUNTER — Ambulatory Visit (INDEPENDENT_AMBULATORY_CARE_PROVIDER_SITE_OTHER): Admitting: Neurology

## 2015-03-14 VITALS — BP 112/84 | HR 94 | Ht 71.0 in | Wt 189.0 lb

## 2015-03-14 DIAGNOSIS — G20 Parkinson's disease: Secondary | ICD-10-CM

## 2015-03-14 DIAGNOSIS — M129 Arthropathy, unspecified: Secondary | ICD-10-CM

## 2015-03-14 DIAGNOSIS — M19011 Primary osteoarthritis, right shoulder: Secondary | ICD-10-CM

## 2015-03-14 DIAGNOSIS — G4700 Insomnia, unspecified: Secondary | ICD-10-CM | POA: Diagnosis not present

## 2015-03-14 NOTE — Progress Notes (Signed)
[name] [name] was seen today in the movement disorders clinic for neurologic consultation at the request of FRY,[name], MD.  The consultation is for the evaluation of tremor.  Tremor started 1 year ago, but it has been getting worse since October.  The pt is R hand dominant.  The pt has noted worsening.  At first, it was positional and intermittent but that has changed.  It is more constant now.  He notices it at rest.  He can make it stop for a short period of time by concentrating on it.    11/18/13 update:  Pt presents today for follow up, accompanied by his wife who supplements the history.  She was not present last visit.  Pt states that he is doing better on the Mirapex.  He feels that he is about 98% better.  He denies SE with the medication.  No compulsive behaviors are noted.  Feeling well.  Exercising.  No falls.  No hallucinations.  Tremor feels like it is just under the surface but not visible.  No lightheadedness.    02/17/14 update:  Pt is f/u today regarding PD.  He is feeling great on mirapex er, 1.5 mg daily.  Feels sleepy if he relaxes but isn't able to sleep if he tries.  Pt states that he rarely feels/sees tremor.  Balance has been good.  Exercising faithfully.  Has had several kidney stones and asks if related to mirapex.  No hallucinations.  No lightheadedness.    06/22/14 update:  Pt is f/u today re: PD.  Doing well on mirapex ER 1.5 mg daily.  Good balance.  No falls.  No hallucinations. No compulsive behaviors.  Exercising a lot.  States that most day he wouldn't even know he had PD.   The records that were made available to me were reviewed.  Been on several abx for prostatitis.  Saw that he called pcp for phenergan suppositories but states that he only uses it rarely as he had a nissen fundoplication.  He has only used one.  Had derm appt and no evidence of CA.  10/25/14 update:  Pt is accompanied by his wife who supplements the history.  He states that it is "time" to  replace the shoulder.  He has it scheduled but not until December 1.  He isn't sure if it isn't under general anesthesia.  He was prescribed phenergan but he almost never uses it (twice in three years).  He will have no tremor for days to weeks and then may have some tremor.  He has some EDS when he stops when he sits and relaxes.  States if he tried to take a nap, though, he won't be able to sleep.    He doesn't snore at night.  He does take temazepam for sleep at night.  He notices balance isn't quite as good as it used to be.  He hasn't fallen.   He exercises faithfully.  He asks about DBS therapy.  03/14/15 update:  Pt is f/u today.  He is doing well from a PD standpoint.  He remains on mirapex er, 1.5 mg daily.  He is having his shoulder replaced in December.  He is going to be under general anesthesia for surgery.  He states that he had no tremor from last visit until last week, but thinks that was mostly thinking about this visit.  It actually went away over the last few days.  He notes that pain in the  shoulder and coldness in the grocery store will bring out the tremor.  He states that he had 2 episodes of anxiety with driving in [place] and in [place] that he was unusual as he knows [place] well (lived there for a long time).  Still trouble with sleeping despite temazepam and has to be "careful" with that because it causes him to sleep eat.   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    Neuroimaging has not previously been performed.   PREVIOUS MEDICATIONS: none to date  ALLERGIES:   Allergies  Allergen Reactions  . Penicillins Rash    CURRENT MEDICATIONS:  Current Outpatient Prescriptions on File Prior to Visit  Medication Sig Dispense Refill  . atorvastatin (LIPITOR) 20 MG tablet TAKE 1 TABLET DAILY 90 tablet 3  . finasteride (PROSCAR) 5 MG tablet Take 5 mg by mouth daily.    . hydrochlorothiazide (MICROZIDE) 12.5 MG capsule Take 1  capsule (12.5 mg total) by mouth daily. 90 capsule 3  . MIRAPEX ER 1.5 MG TB24 TAKE 1 TABLET DAILY 90 tablet 2  . polyethylene glycol (MIRALAX / GLYCOLAX) packet Take 17 g by mouth daily.    . Probiotic Product (PROBIOTIC DAILY PO) Take 1 capsule by mouth daily.    . tamsulosin (FLOMAX) 0.4 MG CAPS capsule TAKE 2 CAPSULES EVERY DAY 180 capsule 3  . temazepam (RESTORIL) 30 MG capsule Take 1 capsule (30 mg total) by mouth at bedtime as needed for sleep. 90 capsule 1   No current facility-administered medications on file prior to visit.    PAST MEDICAL HISTORY:   Past Medical History  Diagnosis Date  . Hyperlipidemia   . BPH with urinary obstruction   . Insomnia   . GERD (gastroesophageal reflux disease)   . Barrett's esophagus     sees Dr. Delfin Edis   . History of hiatal hernia   . Arthritis   . Chronic kidney disease     kidney stones, sees Dr. Raynelle Bring   . Diverticulosis   . Esophageal dysmotility   . [facility] 2012    PAST SURGICAL HISTORY:   Past Surgical History  Procedure Laterality Date  . Cholecystectomy    . Tonsillectomy and adenoidectomy    . Esophagogastroduodenoscopy  06-24-13    per Dr. Olevia Perches, no signs of Barretts, repeat in 5 yrs   . Colonoscopy  06-24-13    per Dr. Olevia Perches, no polyps, diveticulosis only, repeat in 10 yrs   . Hernia repair      right inguinal  . Nissen fundoplication  [id number]    laparoscopic per Dr. Johnathan Hausen  . Orchiopexy      right side at age 75   . Lipomas removed      x10/ on arms and legs  . Lasik      bilateral    SOCIAL HISTORY:   Social History   Social History  . Marital Status: Married    Spouse Name: N/A  . Number of Children: N/A  . Years of Education: N/A   Occupational History  . Not on file.   Social History Main Topics  . Smoking status: Never Smoker   . Smokeless tobacco: Never Used  . Alcohol Use: No  . Drug Use: No  . Sexual Activity: Not on file   Other Topics  Concern  . Not on file   Social History Narrative  FAMILY HISTORY:   Family Status  Relation Status Death Age  . Mother Alive 32    rheumatoid arthritis  . Father Deceased 37    esophageal cancer  . Maternal Grandfather Deceased     esophagel cancer  . Paternal Grandmother Deceased   . Paternal Grandfather Deceased   . Sister Alive   . Brother Alive   . Daughter Alive   . Maternal Grandmother Deceased   . Sister Alive   . Sister Alive   . Sister Alive   . Daughter Alive     ROS:  A complete 10 system review of systems was obtained and was unremarkable apart from what is mentioned above.  PHYSICAL EXAMINATION:    VITALS:   Filed Vitals:   03/14/15 1058  BP: 112/84  Pulse: 94  Height: 5\' 11"  (1.803 m)  Weight: 189 lb (85.73 kg)    GEN:  The patient appears stated age and is in NAD. HEENT:  Normocephalic, atraumatic.  The mucous membranes are moist. The superficial temporal arteries are without ropiness or tenderness. CV:  RRR Lungs:  CTAB Neck/HEME:  There are no carotid bruits bilaterally.  Neurological examination:  Orientation: The patient is alert and oriented x3. Fund of knowledge is appropriate.  Recent and remote memory are intact.  Attention and concentration are normal.    Able to name objects and repeat phrases. Cranial nerves: There is good facial symmetry. Pupils are equal round and reactive to light bilaterally. Fundoscopic exam reveals clear margins bilaterally. Extraocular muscles are intact. The visual fields are full to confrontational testing. The speech is fluent and clear. Soft palate rises symmetrically and there is no tongue deviation. Hearing is intact to conversational tone. Sensation: Sensation is intact to light touch throughout. Motor: Strength is 5/5 in the bilateral upper and lower extremities.   Shoulder shrug is equal and symmetric.  There is no pronator drift.  No fasciculations.  Movement examination: Tone: There is normal tone  bilaterally today Abnormal movements: no tremor noted today Coordination:  There is no decremation with RAMs today  Gait and Station: The patient has no difficulty arising out of a deep-seated chair without the use of the hands. The patient's stride length is normal with good arm swing today.  He has a negative pull test.  LABS  Lab Results  Component Value Date   TSH 1.66 05/24/2014     Chemistry      Component Value Date/Time   NA 140 05/24/2014 0935   K 4.8 05/24/2014 0935   CL 101 05/24/2014 0935   CO2 24 05/24/2014 0935   BUN 16 05/24/2014 0935   CREATININE 1.0 05/24/2014 0935      Component Value Date/Time   CALCIUM 9.8 05/24/2014 0935   ALKPHOS 64 05/24/2014 0935   AST 25 05/24/2014 0935   ALT 25 05/24/2014 0935   BILITOT 3.0* 05/24/2014 0935       ASSESSMENT/PLAN:  1. Parkinsons disease, Hoehn and Yahr stage 1.  -Doing well on mirapex ER - 1.5 mg daily and we will continue that. Risks, benefits, side effects and alternative therapies were discussed.  The opportunity to ask questions was given and they were answered to the best of my ability.  The patient expressed understanding and willingness to follow the outlined treatment protocols.  -continue with faithful exercise.  Talked about emerging therapies and safety with PD.    -He asked about DBS therapy again today.  He also asked about future emerging treatments.  We talked about these things along with safety, as he is climbing on ladders.  Much greater than 50% of this 40 minute visit was spent in counseling in this regard. 2.  Right shoulder osteoarthritis  -Planning to undergo total shoulder replacement in December, 2016.   3.  Insomnia  -add melatonin - 3mg  to the temazepam, which he uses which is causing sleep eating.  Told him that while I currently do not object to the temazepam, I often take older patients off of this to have Parkinson's disease.  I explained my rationale for this, and he stated that he may  try to decrease this on his own.  He did try to discontinue it "cold [place]" and it was very unsuccessful. 3.  Return in about 4 months (around 07/14/2015).

## 2015-03-15 ENCOUNTER — Telehealth: Payer: Self-pay | Admitting: Neurology

## 2015-03-15 NOTE — Telephone Encounter (Signed)
Pt's wife Neoma Laming called and wanted the information on Dr Doristine Devoid speech in [place] next week/Dawn CB# [phone]

## 2015-03-15 NOTE — Telephone Encounter (Signed)
Do you have any information about where and what time this is scheduled?

## 2015-03-15 NOTE — Telephone Encounter (Signed)
Here is the information:   The next meeting will be on Monday, September, 12th at 4 pm at the [place], [place] and [employer] [phone]

## 2015-03-15 NOTE — Telephone Encounter (Signed)
Tried to call back with no answer. Left information on voicemail.

## 2015-04-17 ENCOUNTER — Other Ambulatory Visit: Payer: Self-pay | Admitting: Family Medicine

## 2015-05-27 ENCOUNTER — Other Ambulatory Visit (INDEPENDENT_AMBULATORY_CARE_PROVIDER_SITE_OTHER)

## 2015-05-27 DIAGNOSIS — Z0000 Encounter for general adult medical examination without abnormal findings: Secondary | ICD-10-CM | POA: Diagnosis not present

## 2015-05-27 LAB — POCT URINALYSIS DIPSTICK
BILIRUBIN UA: NEGATIVE
Glucose, UA: NEGATIVE
Ketones, UA: NEGATIVE
LEUKOCYTES UA: NEGATIVE
NITRITE UA: NEGATIVE
PH UA: 7
PROTEIN UA: NEGATIVE
RBC UA: NEGATIVE
SPEC GRAV UA: 1.015
Urobilinogen, UA: 0.2

## 2015-05-27 LAB — CBC WITH DIFFERENTIAL/PLATELET
BASOS ABS: 0 10*3/uL (ref 0.0–0.1)
BASOS PCT: 0.5 % (ref 0.0–3.0)
EOS ABS: 0.2 10*3/uL (ref 0.0–0.7)
Eosinophils Relative: 2.9 % (ref 0.0–5.0)
HEMATOCRIT: 47.3 % (ref 39.0–52.0)
HEMOGLOBIN: 16.2 g/dL (ref 13.0–17.0)
LYMPHS PCT: 28.5 % (ref 12.0–46.0)
Lymphs Abs: 1.6 10*3/uL (ref 0.7–4.0)
MCHC: 34.3 g/dL (ref 30.0–36.0)
MCV: 92.4 fl (ref 78.0–100.0)
Monocytes Absolute: 0.4 10*3/uL (ref 0.1–1.0)
Monocytes Relative: 7.1 % (ref 3.0–12.0)
Neutro Abs: 3.4 10*3/uL (ref 1.4–7.7)
Neutrophils Relative %: 61 % (ref 43.0–77.0)
Platelets: 226 10*3/uL (ref 150.0–400.0)
RBC: 5.12 Mil/uL (ref 4.22–5.81)
RDW: 13.2 % (ref 11.5–15.5)
WBC: 5.6 10*3/uL (ref 4.0–10.5)

## 2015-05-27 LAB — HEPATIC FUNCTION PANEL
ALK PHOS: 74 U/L (ref 39–117)
ALT: 21 U/L (ref 0–53)
AST: 18 U/L (ref 0–37)
Albumin: 4.2 g/dL (ref 3.5–5.2)
BILIRUBIN DIRECT: 0.4 mg/dL — AB (ref 0.0–0.3)
BILIRUBIN TOTAL: 2.4 mg/dL — AB (ref 0.2–1.2)
TOTAL PROTEIN: 6.5 g/dL (ref 6.0–8.3)

## 2015-05-27 LAB — BASIC METABOLIC PANEL
BUN: 12 mg/dL (ref 6–23)
CHLORIDE: 101 meq/L (ref 96–112)
CO2: 32 mEq/L (ref 19–32)
CREATININE: 0.78 mg/dL (ref 0.40–1.50)
Calcium: 9.7 mg/dL (ref 8.4–10.5)
GFR: 106.66 mL/min (ref >60.00)
Glucose, Bld: 114 mg/dL — ABNORMAL HIGH (ref 70–99)
Potassium: 4.5 mEq/L (ref 3.5–5.1)
Sodium: 141 mEq/L (ref 135–145)

## 2015-05-27 LAB — LIPID PANEL
CHOL/HDL RATIO: 3
CHOLESTEROL: 145 mg/dL (ref 0–200)
HDL: 42.3 mg/dL (ref >39.00)
LDL Cholesterol: 71 mg/dL (ref 0–99)
NONHDL: 103.12
Triglycerides: 162 mg/dL — ABNORMAL HIGH (ref 0.0–149.0)
VLDL: 32.4 mg/dL (ref 0.0–40.0)

## 2015-05-27 LAB — PSA: PSA: 0.68 ng/mL (ref 0.10–4.00)

## 2015-05-27 LAB — TSH: TSH: 1.58 u[IU]/mL (ref 0.35–4.50)

## 2015-06-03 ENCOUNTER — Ambulatory Visit (INDEPENDENT_AMBULATORY_CARE_PROVIDER_SITE_OTHER): Admitting: Family Medicine

## 2015-06-03 ENCOUNTER — Encounter: Payer: Self-pay | Admitting: Family Medicine

## 2015-06-03 VITALS — BP 127/74 | HR 78 | Temp 98.5°F | Ht 71.0 in | Wt 194.0 lb

## 2015-06-03 DIAGNOSIS — Z0000 Encounter for general adult medical examination without abnormal findings: Secondary | ICD-10-CM | POA: Diagnosis not present

## 2015-06-03 NOTE — Progress Notes (Signed)
Pre visit review using our clinic review tool, if applicable. No additional management support is needed unless otherwise documented below in the visit note. 

## 2015-06-03 NOTE — Progress Notes (Signed)
   Subjective:    Patient ID: [name] [name], male    DOB: September 05, 1950, 64 y.o.   MRN: [id number]  HPI 64 yr old male for a cpx. He feels great except for some orthopedic issues. He is scheduled for a total replacement of the right shoulder with Dr. Onnie Graham on 06-23-15. 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 He has seen Dr. Nelva Bush for this a year ago. He takes Advil prn. He sees Urology regularly for prostate exams.    Review of Systems  Constitutional: Negative.   HENT: Negative.   Eyes: Negative.   Respiratory: Negative.   Cardiovascular: Negative.   Gastrointestinal: Negative.   Genitourinary: Negative.   Musculoskeletal: Positive for back pain and arthralgias. Negative for myalgias, joint swelling, gait problem, neck pain and neck stiffness.  Skin: Negative.   Neurological: Negative.   Psychiatric/Behavioral: Negative.        Objective:   Physical Exam  Constitutional: He is oriented to person, place, and time. He appears well-developed and well-nourished. No distress.  HENT:  Head: Normocephalic and atraumatic.  Right Ear: External ear normal.  Left Ear: External ear normal.  Nose: Nose normal.  Mouth/Throat: Oropharynx is clear and moist. No oropharyngeal exudate.  Eyes: Conjunctivae and EOM are normal. Pupils are equal, round, and reactive to light. Right eye exhibits no discharge. Left eye exhibits no discharge. No scleral icterus.  Neck: Neck supple. No JVD present. No tracheal deviation present. No thyromegaly present.  Cardiovascular: Normal rate, regular rhythm, normal heart sounds and intact distal pulses.  Exam reveals no gallop and no friction rub.   No murmur heard. EKG normal   Pulmonary/Chest: Effort normal and breath sounds normal. No respiratory distress. He has no wheezes. He has no rales. He exhibits no tenderness.  Abdominal: Soft. Bowel sounds are normal. He exhibits no distension and no  mass. There is no tenderness. There is no rebound and no guarding.  Musculoskeletal: Normal range of motion. He exhibits no edema or tenderness.  Lymphadenopathy:    He has no cervical adenopathy.  Neurological: He is alert and oriented to person, place, and time. He has normal reflexes. No cranial nerve deficit. He exhibits normal muscle tone. Coordination normal.  Skin: Skin is warm and dry. No rash noted. He is not diaphoretic. No erythema. No pallor.  Psychiatric: He has a normal mood and affect. His behavior is normal. Judgment and thought content normal.          Assessment & Plan:  Well exam. We discussed diet and exercise advice. He will have shoulder surgery as above. I advised him to follow up with Dr. Nelva Bush for the sciatica.

## 2015-06-14 ENCOUNTER — Encounter (HOSPITAL_COMMUNITY)
Admission: RE | Admit: 2015-06-14 | Discharge: 2015-06-14 | Disposition: A | Discharge status: Home / Self Care | Source: Ambulatory Visit | Attending: Orthopedic Surgery | Admitting: Orthopedic Surgery

## 2015-06-14 ENCOUNTER — Encounter (HOSPITAL_COMMUNITY): Payer: Self-pay

## 2015-06-14 DIAGNOSIS — M19011 Primary osteoarthritis, right shoulder: Secondary | ICD-10-CM | POA: Diagnosis not present

## 2015-06-14 DIAGNOSIS — Z01812 Encounter for preprocedural laboratory examination: Secondary | ICD-10-CM | POA: Insufficient documentation

## 2015-06-14 HISTORY — DX: Personal history of urinary calculi: Z87.442

## 2015-06-14 HISTORY — DX: Lyme disease, unspecified: A69.20

## 2015-06-14 HISTORY — DX: Adverse effect of unspecified anesthetic, initial encounter: T41.45XA

## 2015-06-14 HISTORY — DX: Other complications of anesthesia, initial encounter: T88.59XA

## 2015-06-14 LAB — CBC WITH DIFFERENTIAL/PLATELET
BASOS ABS: 0 10*3/uL (ref 0.0–0.1)
BASOS PCT: 1 %
EOS ABS: 0.1 10*3/uL (ref 0.0–0.7)
EOS PCT: 2 %
HCT: 44.5 % (ref 39.0–52.0)
Hemoglobin: 16.1 g/dL (ref 13.0–17.0)
Lymphocytes Relative: 27 %
Lymphs Abs: 1.6 10*3/uL (ref 0.7–4.0)
MCH: 31.7 pg (ref 26.0–34.0)
MCHC: 36.2 g/dL — ABNORMAL HIGH (ref 30.0–36.0)
MCV: 87.6 fL (ref 78.0–100.0)
MONO ABS: 0.5 10*3/uL (ref 0.1–1.0)
Monocytes Relative: 8 %
Neutro Abs: 3.7 10*3/uL (ref 1.7–7.7)
Neutrophils Relative %: 62 %
PLATELETS: 138 10*3/uL — AB (ref 150–400)
RBC: 5.08 MIL/uL (ref 4.22–5.81)
RDW: 12.8 % (ref 11.5–15.5)
WBC: 5.9 10*3/uL (ref 4.0–10.5)

## 2015-06-14 LAB — APTT: APTT: 27 s (ref 24–37)

## 2015-06-14 LAB — PROTIME-INR
INR: 1.08 (ref 0.00–1.49)
Prothrombin Time: 14.2 seconds (ref 11.6–15.2)

## 2015-06-14 LAB — SURGICAL PCR SCREEN
MRSA, PCR: NEGATIVE
STAPHYLOCOCCUS AUREUS: NEGATIVE

## 2015-06-14 NOTE — Pre-Procedure Instructions (Signed)
    [name] [name] Bring  06/14/2015      Your procedure is scheduled on Thursday, December 8..  Report to [facility] at 5:30 A.M.                 Your surgery is scheduled for 7:30 A.M.   Call this number if you have problems the morning of surgery:[phone]               For any other questions, please call [phone], Monday - Friday 8 AM - 4 PM.   Remember:  Do not eat food or drink liquids after midnight Wednesday, 7.  Take these medicines the morning of surgery with A SIP OF WATER finasteride (PROSCAR), tamsulosin (FLOMAX), Mirapex.    Do not wear jewelry, make-up or nail polish.   Do not wear lotions, powders, or perfumes.    Men may shave face and neck.   Do not bring valuables to the hospital.   [facility] is not responsible for any belongings or valuables.  Contacts, dentures or bridgework may not be worn into surgery.  Leave your suitcase in the car.  After surgery it may be brought to your room.  For patients admitted to the hospital, discharge time will be determined by your treatment team.  Special instructions:  Review  [facility] - Preparing For Surgery.  Please read over the following fact sheets that you were given. Pain Booklet, Coughing and Deep Breathing, Blood Transfusion Information and Surgical Site Infection Prevention

## 2015-06-17 ENCOUNTER — Telehealth: Payer: Self-pay | Admitting: Family Medicine

## 2015-06-17 NOTE — Telephone Encounter (Signed)
Hilda Blades, the pt's wife called saying [facility] no longer carries Tamsulosin but they do carry Flomax HCL 0.4Mg  2 capsules per day. She'd like Dr. Sarajane Jews to begin sending the Rx through [facility] for the Flomax instead of [facility]. If you have questions or concerns, please give she or the pt a call.  [facility] ph# [phone]  /  Fax# [phone]  [facility] ph# [phone] Thank you.

## 2015-06-19 ENCOUNTER — Other Ambulatory Visit: Payer: Self-pay | Admitting: Family Medicine

## 2015-06-20 MED ORDER — TAMSULOSIN HCL 0.4 MG PO CAPS: 0.8000 mg | ORAL_CAPSULE | Freq: Every day | ORAL | Status: DC

## 2015-06-20 NOTE — Telephone Encounter (Signed)
Can we send in for brand name only?

## 2015-06-20 NOTE — Telephone Encounter (Signed)
Ready to be faxed.

## 2015-06-20 NOTE — Telephone Encounter (Signed)
Script was faxed to [facility] and I tried to reach pt no answer or option to leave a message.

## 2015-06-22 MED ORDER — LACTATED RINGERS IV SOLN
INTRAVENOUS | Status: DC
  Administered 2015-06-23 (×2): via INTRAVENOUS

## 2015-06-22 MED ORDER — CEFAZOLIN SODIUM-DEXTROSE 2-3 GM-% IV SOLR
2.0000 g | INTRAVENOUS | Status: AC
  Administered 2015-06-23: 2 g via INTRAVENOUS
  Filled 2015-06-22: qty 50

## 2015-06-22 MED ORDER — CHLORHEXIDINE GLUCONATE 4 % EX LIQD: 60.0000 mL | Freq: Once | CUTANEOUS | Status: DC

## 2015-06-23 ENCOUNTER — Ambulatory Visit (HOSPITAL_COMMUNITY): Admitting: Emergency Medicine

## 2015-06-23 ENCOUNTER — Ambulatory Visit (HOSPITAL_COMMUNITY): Admitting: Anesthesiology

## 2015-06-23 ENCOUNTER — Observation Stay (HOSPITAL_COMMUNITY)
Admission: RE | Admit: 2015-06-23 | Discharge: 2015-06-24 | Disposition: A | Discharge status: Home / Self Care | Source: Ambulatory Visit | Attending: Orthopedic Surgery | Admitting: Orthopedic Surgery

## 2015-06-23 ENCOUNTER — Encounter (HOSPITAL_COMMUNITY)
Admission: RE | Disposition: A | Discharge status: Home / Self Care | Payer: Self-pay | Source: Ambulatory Visit | Attending: Orthopedic Surgery

## 2015-06-23 ENCOUNTER — Encounter (HOSPITAL_COMMUNITY): Payer: Self-pay | Admitting: *Deleted

## 2015-06-23 DIAGNOSIS — Z87442 Personal history of urinary calculi: Secondary | ICD-10-CM | POA: Insufficient documentation

## 2015-06-23 DIAGNOSIS — K224 Dyskinesia of esophagus: Secondary | ICD-10-CM | POA: Insufficient documentation

## 2015-06-23 DIAGNOSIS — G20 Parkinson's disease: Secondary | ICD-10-CM | POA: Diagnosis not present

## 2015-06-23 DIAGNOSIS — N401 Enlarged prostate with lower urinary tract symptoms: Secondary | ICD-10-CM | POA: Insufficient documentation

## 2015-06-23 DIAGNOSIS — Z880 Allergy status to penicillin: Secondary | ICD-10-CM | POA: Insufficient documentation

## 2015-06-23 DIAGNOSIS — N138 Other obstructive and reflux uropathy: Secondary | ICD-10-CM | POA: Diagnosis not present

## 2015-06-23 DIAGNOSIS — K2270 Barrett's esophagus without dysplasia: Secondary | ICD-10-CM | POA: Diagnosis not present

## 2015-06-23 DIAGNOSIS — E785 Hyperlipidemia, unspecified: Secondary | ICD-10-CM | POA: Insufficient documentation

## 2015-06-23 DIAGNOSIS — K449 Diaphragmatic hernia without obstruction or gangrene: Secondary | ICD-10-CM | POA: Diagnosis not present

## 2015-06-23 DIAGNOSIS — G8929 Other chronic pain: Secondary | ICD-10-CM | POA: Insufficient documentation

## 2015-06-23 DIAGNOSIS — M19011 Primary osteoarthritis, right shoulder: Principal | ICD-10-CM | POA: Insufficient documentation

## 2015-06-23 DIAGNOSIS — M25511 Pain in right shoulder: Secondary | ICD-10-CM | POA: Diagnosis not present

## 2015-06-23 DIAGNOSIS — K219 Gastro-esophageal reflux disease without esophagitis: Secondary | ICD-10-CM | POA: Diagnosis not present

## 2015-06-23 DIAGNOSIS — Z79899 Other long term (current) drug therapy: Secondary | ICD-10-CM | POA: Diagnosis not present

## 2015-06-23 DIAGNOSIS — G4700 Insomnia, unspecified: Secondary | ICD-10-CM | POA: Insufficient documentation

## 2015-06-23 HISTORY — PX: TOTAL SHOULDER ARTHROPLASTY: SHX126

## 2015-06-23 LAB — COMPREHENSIVE METABOLIC PANEL
ALBUMIN: 3.7 g/dL (ref 3.5–5.0)
ALT: 28 U/L (ref 17–63)
AST: 24 U/L (ref 15–41)
Alkaline Phosphatase: 72 U/L (ref 38–126)
Anion gap: 9 (ref 5–15)
BUN: 9 mg/dL (ref 6–20)
CHLORIDE: 103 mmol/L (ref 101–111)
CO2: 29 mmol/L (ref 22–32)
CREATININE: 0.73 mg/dL (ref 0.61–1.24)
Calcium: 9.3 mg/dL (ref 8.9–10.3)
GFR calc non Af Amer: >60 mL/min (ref >60)
GLUCOSE: 124 mg/dL — AB (ref 65–99)
Potassium: 3.4 mmol/L — ABNORMAL LOW (ref 3.5–5.1)
SODIUM: 141 mmol/L (ref 135–145)
Total Bilirubin: 2.9 mg/dL — ABNORMAL HIGH (ref 0.3–1.2)
Total Protein: 6.6 g/dL (ref 6.5–8.1)

## 2015-06-23 SURGERY — ARTHROPLASTY, SHOULDER, TOTAL
Anesthesia: General | Site: Shoulder | Laterality: Right

## 2015-06-23 MED ORDER — DIPHENHYDRAMINE HCL 12.5 MG/5ML PO ELIX: 12.5000 mg | ORAL_SOLUTION | ORAL | Status: DC | PRN

## 2015-06-23 MED ORDER — PROPOFOL 10 MG/ML IV BOLUS
INTRAVENOUS | Status: DC | PRN
  Administered 2015-06-23 (×2): 100 mg via INTRAVENOUS

## 2015-06-23 MED ORDER — OXYCODONE HCL 5 MG PO TABS
5.0000 mg | ORAL_TABLET | Freq: Once | ORAL | Status: AC | PRN
Start: 2015-06-23 — End: 2015-06-23
  Administered 2015-06-23: 5 mg via ORAL

## 2015-06-23 MED ORDER — METHOCARBAMOL 1000 MG/10ML IJ SOLN: 500.0000 mg | Freq: Four times a day (QID) | INTRAVENOUS | Status: DC | PRN

## 2015-06-23 MED ORDER — ONDANSETRON HCL 4 MG/2ML IJ SOLN
4.0000 mg | Freq: Four times a day (QID) | INTRAMUSCULAR | Status: DC | PRN
  Administered 2015-06-23 (×2): 4 mg via INTRAVENOUS
  Filled 2015-06-23: qty 2

## 2015-06-23 MED ORDER — TAMSULOSIN HCL 0.4 MG PO CAPS
0.8000 mg | ORAL_CAPSULE | Freq: Every day | ORAL | Status: DC
  Administered 2015-06-24: 0.8 mg via ORAL
  Filled 2015-06-23: qty 2

## 2015-06-23 MED ORDER — PHENOL 1.4 % MT LIQD: 1.0000 | OROMUCOSAL | Status: DC | PRN

## 2015-06-23 MED ORDER — METHOCARBAMOL 500 MG PO TABS
500.0000 mg | ORAL_TABLET | Freq: Four times a day (QID) | ORAL | Status: DC | PRN
  Administered 2015-06-23 – 2015-06-24 (×2): 500 mg via ORAL
  Filled 2015-06-23 (×2): qty 1

## 2015-06-23 MED ORDER — FENTANYL CITRATE (PF) 250 MCG/5ML IJ SOLN
INTRAMUSCULAR | Status: AC
  Filled 2015-06-23: qty 5

## 2015-06-23 MED ORDER — DOCUSATE SODIUM 100 MG PO CAPS
100.0000 mg | ORAL_CAPSULE | Freq: Two times a day (BID) | ORAL | Status: DC
Start: 2015-06-23 — End: 2015-06-24
  Administered 2015-06-24: 100 mg via ORAL
  Filled 2015-06-23 (×2): qty 1

## 2015-06-23 MED ORDER — ONDANSETRON HCL 4 MG/2ML IJ SOLN
INTRAMUSCULAR | Status: DC | PRN
  Administered 2015-06-23: 4 mg via INTRAVENOUS

## 2015-06-23 MED ORDER — ROCURONIUM BROMIDE 50 MG/5ML IV SOLN
INTRAVENOUS | Status: AC
  Filled 2015-06-23: qty 1

## 2015-06-23 MED ORDER — ACETAMINOPHEN 325 MG PO TABS: 650.0000 mg | ORAL_TABLET | Freq: Four times a day (QID) | ORAL | Status: DC | PRN

## 2015-06-23 MED ORDER — SODIUM CHLORIDE 0.9 % IJ SOLN
INTRAMUSCULAR | Status: AC
  Filled 2015-06-23: qty 10

## 2015-06-23 MED ORDER — SODIUM CHLORIDE 0.9 % IV SOLN
10.0000 mg | INTRAVENOUS | Status: DC | PRN
  Administered 2015-06-23: 10 ug/min via INTRAVENOUS

## 2015-06-23 MED ORDER — PRAMIPEXOLE DIHYDROCHLORIDE ER 1.5 MG PO TB24: 1.0000 | ORAL_TABLET | Freq: Every day | ORAL | Status: DC

## 2015-06-23 MED ORDER — OXYCODONE HCL 5 MG PO TABS
ORAL_TABLET | ORAL | Status: AC
  Filled 2015-06-23: qty 1

## 2015-06-23 MED ORDER — PROPOFOL 10 MG/ML IV BOLUS
INTRAVENOUS | Status: AC
  Filled 2015-06-23: qty 20

## 2015-06-23 MED ORDER — ONDANSETRON HCL 4 MG/2ML IJ SOLN
INTRAMUSCULAR | Status: AC
  Filled 2015-06-23: qty 2

## 2015-06-23 MED ORDER — NEOSTIGMINE METHYLSULFATE 10 MG/10ML IV SOLN
INTRAVENOUS | Status: DC | PRN
  Administered 2015-06-23: 3 mg via INTRAVENOUS

## 2015-06-23 MED ORDER — TEMAZEPAM 15 MG PO CAPS
30.0000 mg | ORAL_CAPSULE | Freq: Every evening | ORAL | Status: DC | PRN
Start: 2015-06-23 — End: 2015-06-24
  Administered 2015-06-23: 30 mg via ORAL
  Filled 2015-06-23: qty 2

## 2015-06-23 MED ORDER — GLYCOPYRROLATE 0.2 MG/ML IJ SOLN
INTRAMUSCULAR | Status: AC
  Filled 2015-06-23: qty 2

## 2015-06-23 MED ORDER — PHENYLEPHRINE HCL 10 MG/ML IJ SOLN
INTRAMUSCULAR | Status: DC | PRN
  Administered 2015-06-23: 80 ug via INTRAVENOUS

## 2015-06-23 MED ORDER — ONDANSETRON HCL 4 MG PO TABS
4.0000 mg | ORAL_TABLET | Freq: Four times a day (QID) | ORAL | Status: DC | PRN
  Administered 2015-06-24: 4 mg via ORAL
  Filled 2015-06-23: qty 1

## 2015-06-23 MED ORDER — GLYCOPYRROLATE 0.2 MG/ML IJ SOLN
INTRAMUSCULAR | Status: DC | PRN
  Administered 2015-06-23: 0.4 mg via INTRAVENOUS

## 2015-06-23 MED ORDER — ROCURONIUM BROMIDE 100 MG/10ML IV SOLN
INTRAVENOUS | Status: DC | PRN
  Administered 2015-06-23: 40 mg via INTRAVENOUS

## 2015-06-23 MED ORDER — 0.9 % SODIUM CHLORIDE (POUR BTL) OPTIME
TOPICAL | Status: DC | PRN
  Administered 2015-06-23: 1000 mL

## 2015-06-23 MED ORDER — LACTATED RINGERS IV SOLN: INTRAVENOUS | Status: DC

## 2015-06-23 MED ORDER — KETOROLAC TROMETHAMINE 15 MG/ML IJ SOLN
7.5000 mg | Freq: Four times a day (QID) | INTRAMUSCULAR | Status: AC
  Administered 2015-06-23 – 2015-06-24 (×3): 7.5 mg via INTRAVENOUS
  Filled 2015-06-23 (×3): qty 1

## 2015-06-23 MED ORDER — SUCCINYLCHOLINE CHLORIDE 20 MG/ML IJ SOLN
INTRAMUSCULAR | Status: AC
  Filled 2015-06-23: qty 1

## 2015-06-23 MED ORDER — MIDAZOLAM HCL 5 MG/5ML IJ SOLN
INTRAMUSCULAR | Status: DC | PRN
  Administered 2015-06-23: 2 mg via INTRAVENOUS

## 2015-06-23 MED ORDER — BISACODYL 5 MG PO TBEC: 5.0000 mg | DELAYED_RELEASE_TABLET | Freq: Every day | ORAL | Status: DC | PRN

## 2015-06-23 MED ORDER — MENTHOL 3 MG MT LOZG: 1.0000 | LOZENGE | OROMUCOSAL | Status: DC | PRN

## 2015-06-23 MED ORDER — FENTANYL CITRATE (PF) 100 MCG/2ML IJ SOLN: 25.0000 ug | INTRAMUSCULAR | Status: DC | PRN

## 2015-06-23 MED ORDER — ACETAMINOPHEN 325 MG PO TABS: 325.0000 mg | ORAL_TABLET | ORAL | Status: DC | PRN

## 2015-06-23 MED ORDER — ACETAMINOPHEN 650 MG RE SUPP: 650.0000 mg | Freq: Four times a day (QID) | RECTAL | Status: DC | PRN

## 2015-06-23 MED ORDER — POLYETHYLENE GLYCOL 3350 17 G PO PACK: 17.0000 g | PACK | Freq: Every day | ORAL | Status: DC | PRN

## 2015-06-23 MED ORDER — PHENYLEPHRINE HCL 10 MG/ML IJ SOLN
INTRAMUSCULAR | Status: AC
  Filled 2015-06-23: qty 2

## 2015-06-23 MED ORDER — METOCLOPRAMIDE HCL 5 MG PO TABS: 5.0000 mg | ORAL_TABLET | Freq: Three times a day (TID) | ORAL | Status: DC | PRN

## 2015-06-23 MED ORDER — LIDOCAINE HCL (CARDIAC) 20 MG/ML IV SOLN
INTRAVENOUS | Status: AC
  Filled 2015-06-23: qty 5

## 2015-06-23 MED ORDER — NEOSTIGMINE METHYLSULFATE 10 MG/10ML IV SOLN
INTRAVENOUS | Status: AC
  Filled 2015-06-23: qty 1

## 2015-06-23 MED ORDER — FLEET ENEMA 7-19 GM/118ML RE ENEM: 1.0000 | ENEMA | Freq: Once | RECTAL | Status: DC | PRN

## 2015-06-23 MED ORDER — EPHEDRINE SULFATE 50 MG/ML IJ SOLN
INTRAMUSCULAR | Status: DC | PRN
  Administered 2015-06-23: 5 mg via INTRAVENOUS
  Administered 2015-06-23: 10 mg via INTRAVENOUS
  Administered 2015-06-23 (×2): 5 mg via INTRAVENOUS
  Administered 2015-06-23: 10 mg via INTRAVENOUS

## 2015-06-23 MED ORDER — METOCLOPRAMIDE HCL 5 MG/ML IJ SOLN: 5.0000 mg | Freq: Three times a day (TID) | INTRAMUSCULAR | Status: DC | PRN

## 2015-06-23 MED ORDER — TRANEXAMIC ACID 1000 MG/10ML IV SOLN
1000.0000 mg | INTRAVENOUS | Status: AC
  Administered 2015-06-23: 1000 mg via INTRAVENOUS
  Filled 2015-06-23: qty 10

## 2015-06-23 MED ORDER — ATORVASTATIN CALCIUM 20 MG PO TABS
20.0000 mg | ORAL_TABLET | Freq: Every day | ORAL | Status: DC
  Filled 2015-06-23: qty 1

## 2015-06-23 MED ORDER — METHOCARBAMOL 1000 MG/10ML IJ SOLN
500.0000 mg | INTRAMUSCULAR | Status: DC
  Filled 2015-06-23: qty 5

## 2015-06-23 MED ORDER — PRAMIPEXOLE DIHYDROCHLORIDE 1.5 MG PO TABS
1.5000 mg | ORAL_TABLET | Freq: Every day | ORAL | Status: DC
  Administered 2015-06-24: 1.5 mg via ORAL
  Filled 2015-06-23: qty 1

## 2015-06-23 MED ORDER — HYDROMORPHONE HCL 1 MG/ML IJ SOLN: 1.0000 mg | INTRAMUSCULAR | Status: DC | PRN

## 2015-06-23 MED ORDER — OXYCODONE HCL 5 MG PO TABS
5.0000 mg | ORAL_TABLET | ORAL | Status: DC | PRN
  Administered 2015-06-23: 5 mg via ORAL
  Administered 2015-06-23 – 2015-06-24 (×3): 10 mg via ORAL
  Filled 2015-06-23 (×3): qty 2

## 2015-06-23 MED ORDER — CEFAZOLIN SODIUM-DEXTROSE 2-3 GM-% IV SOLR
2.0000 g | Freq: Four times a day (QID) | INTRAVENOUS | Status: AC
  Administered 2015-06-23 – 2015-06-24 (×3): 2 g via INTRAVENOUS
  Filled 2015-06-23 (×3): qty 50

## 2015-06-23 MED ORDER — MIDAZOLAM HCL 2 MG/2ML IJ SOLN
INTRAMUSCULAR | Status: AC
  Filled 2015-06-23: qty 2

## 2015-06-23 MED ORDER — ALUM & MAG HYDROXIDE-SIMETH 200-200-20 MG/5ML PO SUSP
30.0000 mL | ORAL | Status: DC | PRN
  Administered 2015-06-23: 30 mL via ORAL
  Filled 2015-06-23 (×3): qty 30

## 2015-06-23 MED ORDER — HYDROCHLOROTHIAZIDE 12.5 MG PO CAPS
12.5000 mg | ORAL_CAPSULE | Freq: Every day | ORAL | Status: DC
  Administered 2015-06-24: 12.5 mg via ORAL
  Filled 2015-06-23: qty 1

## 2015-06-23 MED ORDER — LIDOCAINE HCL (CARDIAC) 20 MG/ML IV SOLN
INTRAVENOUS | Status: DC | PRN
  Administered 2015-06-23: 70 mg via INTRAVENOUS

## 2015-06-23 MED ORDER — EPHEDRINE SULFATE 50 MG/ML IJ SOLN
INTRAMUSCULAR | Status: AC
  Filled 2015-06-23: qty 1

## 2015-06-23 MED ORDER — ACETAMINOPHEN 160 MG/5ML PO SOLN
325.0000 mg | ORAL | Status: DC | PRN
  Filled 2015-06-23: qty 20.3

## 2015-06-23 MED ORDER — FINASTERIDE 5 MG PO TABS
5.0000 mg | ORAL_TABLET | Freq: Every day | ORAL | Status: DC
  Administered 2015-06-24: 5 mg via ORAL
  Filled 2015-06-23: qty 1

## 2015-06-23 MED ORDER — OXYCODONE HCL 5 MG/5ML PO SOLN: 5.0000 mg | Freq: Once | ORAL | Status: AC | PRN

## 2015-06-23 MED ORDER — FENTANYL CITRATE (PF) 100 MCG/2ML IJ SOLN
INTRAMUSCULAR | Status: DC | PRN
  Administered 2015-06-23 (×3): 50 ug via INTRAVENOUS

## 2015-06-23 MED ORDER — BUPIVACAINE-EPINEPHRINE (PF) 0.5% -1:200000 IJ SOLN
INTRAMUSCULAR | Status: DC | PRN
  Administered 2015-06-23: 25 mL via PERINEURAL

## 2015-06-23 SURGICAL SUPPLY — 75 items
BLADE SAW SGTL 83.5X18.5 (BLADE) ×3 IMPLANT
BRUSH FEMORAL CANAL (MISCELLANEOUS) IMPLANT
CEMENT BONE DEPUY (Cement) ×3 IMPLANT
COVER SURGICAL LIGHT HANDLE (MISCELLANEOUS) ×3 IMPLANT
DERMABOND ADHESIVE PROPEN (GAUZE/BANDAGES/DRESSINGS) ×2
DERMABOND ADVANCED (GAUZE/BANDAGES/DRESSINGS) ×2
DERMABOND ADVANCED .7 DNX12 (GAUZE/BANDAGES/DRESSINGS) ×1 IMPLANT
DERMABOND ADVANCED .7 DNX6 (GAUZE/BANDAGES/DRESSINGS) ×1 IMPLANT
DRAPE ORTHO SPLIT 77X108 STRL (DRAPES) ×4
DRAPE SURG 17X11 SM STRL (DRAPES) ×3 IMPLANT
DRAPE SURG ORHT 6 SPLT 77X108 (DRAPES) ×2 IMPLANT
DRAPE U-SHAPE 47X51 STRL (DRAPES) ×3 IMPLANT
DRILL BIT 7/64X5 (BIT) ×3 IMPLANT
DRSG AQUACEL AG ADV 3.5X10 (GAUZE/BANDAGES/DRESSINGS) ×3 IMPLANT
DRSG MEPILEX BORDER 4X8 (GAUZE/BANDAGES/DRESSINGS) IMPLANT
DURAPREP 26ML APPLICATOR (WOUND CARE) ×3 IMPLANT
ELECT BLADE 4.0 EZ CLEAN MEGAD (MISCELLANEOUS) ×3
ELECT CAUTERY BLADE 6.4 (BLADE) ×3 IMPLANT
ELECT REM PT RETURN 9FT ADLT (ELECTROSURGICAL) ×3
ELECTRODE BLDE 4.0 EZ CLN MEGD (MISCELLANEOUS) ×1 IMPLANT
ELECTRODE REM PT RTRN 9FT ADLT (ELECTROSURGICAL) ×1 IMPLANT
FACESHIELD WRAPAROUND (MASK) ×9 IMPLANT
GLENOID WITH CLEAT MEDIUM (Shoulder) ×3 IMPLANT
GLOVE BIO SURGEON STRL SZ7.5 (GLOVE) ×3 IMPLANT
GLOVE BIO SURGEON STRL SZ8 (GLOVE) ×3 IMPLANT
GLOVE EUDERMIC 7 POWDERFREE (GLOVE) ×3 IMPLANT
GLOVE SS BIOGEL STRL SZ 7.5 (GLOVE) ×1 IMPLANT
GLOVE SUPERSENSE BIOGEL SZ 7.5 (GLOVE) ×2
GOWN STRL REUS W/ TWL LRG LVL3 (GOWN DISPOSABLE) ×1 IMPLANT
GOWN STRL REUS W/ TWL XL LVL3 (GOWN DISPOSABLE) ×2 IMPLANT
GOWN STRL REUS W/TWL LRG LVL3 (GOWN DISPOSABLE) ×2
GOWN STRL REUS W/TWL XL LVL3 (GOWN DISPOSABLE) ×4
HANDPIECE INTERPULSE COAX TIP (DISPOSABLE)
HEAD HUMERAL USP II 50/21 (Shoulder) ×2 IMPLANT
KIT BASIN OR (CUSTOM PROCEDURE TRAY) ×3 IMPLANT
KIT ROOM TURNOVER OR (KITS) ×3 IMPLANT
KIT SET UNIVERSAL (KITS) ×3 IMPLANT
MANIFOLD NEPTUNE II (INSTRUMENTS) ×3 IMPLANT
NDL SUT 6 .5 CRC .975X.05 MAYO (NEEDLE) ×1 IMPLANT
NEEDLE HYPO 25GX1X1/2 BEV (NEEDLE) IMPLANT
NEEDLE MAYO TAPER (NEEDLE) ×2
NS IRRIG 1000ML POUR BTL (IV SOLUTION) ×3 IMPLANT
PACK SHOULDER (CUSTOM PROCEDURE TRAY) ×3 IMPLANT
PAD ARMBOARD 7.5X6 YLW CONV (MISCELLANEOUS) ×6 IMPLANT
PASSER SUT SWANSON 36MM LOOP (INSTRUMENTS) IMPLANT
PRESSURIZER FEMORAL UNIV (MISCELLANEOUS) IMPLANT
RESTRAINT HEAD UNIVERSAL NS (MISCELLANEOUS) ×3 IMPLANT
SET HNDPC FAN SPRY TIP SCT (DISPOSABLE) IMPLANT
SLING ARM FOAM STRAP LRG (SOFTGOODS) IMPLANT
SLING ARM IMMOBILIZER LRG (SOFTGOODS) ×3 IMPLANT
SLING ARM XL FOAM STRAP (SOFTGOODS) ×3 IMPLANT
SMARTMIX MINI TOWER (MISCELLANEOUS) ×3
SPONGE LAP 18X18 X RAY DECT (DISPOSABLE) ×6 IMPLANT
SPONGE LAP 4X18 X RAY DECT (DISPOSABLE) ×6 IMPLANT
STEM HUMERAL UNI APEX 10MM (Shoulder) ×2 IMPLANT
SUCTION FRAZIER TIP 10 FR DISP (SUCTIONS) ×3 IMPLANT
SUT BONE WAX W31G (SUTURE) IMPLANT
SUT FIBERWIRE #2 38 T-5 BLUE (SUTURE) ×15
SUT MNCRL AB 3-0 PS2 18 (SUTURE) ×3 IMPLANT
SUT MON AB 2-0 CT1 36 (SUTURE) ×3 IMPLANT
SUT VIC AB 1 CT1 27 (SUTURE) ×4
SUT VIC AB 1 CT1 27XBRD ANBCTR (SUTURE) ×2 IMPLANT
SUT VIC AB 2-0 CT1 27 (SUTURE) ×2
SUT VIC AB 2-0 CT1 TAPERPNT 27 (SUTURE) ×1 IMPLANT
SUT VIC AB 2-0 SH 27 (SUTURE)
SUT VIC AB 2-0 SH 27X BRD (SUTURE) IMPLANT
SUTURE FIBERWR #2 38 T-5 BLUE (SUTURE) ×5 IMPLANT
SYR 30ML SLIP (SYRINGE) ×3 IMPLANT
SYR CONTROL 10ML LL (SYRINGE) IMPLANT
TOWEL OR 17X24 6PK STRL BLUE (TOWEL DISPOSABLE) ×3 IMPLANT
TOWEL OR 17X26 10 PK STRL BLUE (TOWEL DISPOSABLE) ×3 IMPLANT
TOWER CARTRIDGE SMART MIX (DISPOSABLE) IMPLANT
TOWER SMARTMIX MINI (MISCELLANEOUS) ×1 IMPLANT
TRAY FOLEY CATH 16FRSI W/METER (SET/KITS/TRAYS/PACK) IMPLANT
WATER STERILE IRR 1000ML POUR (IV SOLUTION) ×3 IMPLANT

## 2015-06-23 NOTE — Op Note (Signed)
06/23/2015  10:20 AM  PATIENT:   [name] [name]  64 y.o. male  PRE-OPERATIVE DIAGNOSIS:  RIGHT SHOULDER OSTEOARTHRITIS  POST-OPERATIVE DIAGNOSIS:  Same   PROCEDURE:  R TSA, #10 apex stem, medium glenoid, 50/21 head  SURGEON:  Izella Ybanez, Metta Clines M.D.  ASSISTANTS: Shuford pac   ANESTHESIA:   GET + ISB  EBL: 300  SPECIMEN:  none  Drains: none   PATIENT DISPOSITION:  PACU - hemodynamically stable.    PLAN OF CARE: Admit for overnight observation  Dictation# [id number]   Contact # [phone]

## 2015-06-23 NOTE — Op Note (Signed)
NAMEJENNIFER, [name] [name]           ACCOUNT NO.:  [id number]  MEDICAL RECORD NO.:  [id number]  LOCATION:  [id number]                        FACILITY:  [facility]  PHYSICIAN:  Metta Clines. Evertt Chouinard, M.D.  DATE OF BIRTH:  June 25, 1951  DATE OF PROCEDURE:  06/23/2015 DATE OF DISCHARGE:                              OPERATIVE REPORT   PREOPERATIVE DIAGNOSIS:  End-stage right shoulder osteoarthritis.  POSTOPERATIVE DIAGNOSIS:  End-stage right shoulder osteoarthritis.  PROCEDURE:  Right total shoulder arthroplasty utilizing an Arthrex size 10 Apex stem press-fit, a 50 x 21 eccentric head, and a cemented medium glenoid.  SURGEON:  Metta Clines. Jorell Agne, M.D.  Terrence DupontOlivia Mackie A. Shuford, P.A.-C.  ANESTHESIA:  General endotracheal as well as an interscalene block.  ESTIMATED BLOOD LOSS:  300 mL.  DRAINS:  None.  HISTORY:  [name] [name] is a 64 year old gentleman, who has had chronic right shoulder pain with profound restrictions in mobility and increasing functional limitations related to end-stage osteoarthritis. Radiographs show a marked deformity of the humeral head with significant posterior subluxation and glenoid retroversion.  Due to his increasing pain and functional limitations, he is brought to the operating room at this time for planned right total shoulder arthroplasty.  Preoperatively, I counseled [name] [name] regarding treatment options and potential risks versus benefits thereof.  Possible surgical complications were all reviewed including bleeding, infection, neurovascular injury, persistent pain, loss of motion, anesthetic complication, failure of the implant, and possible need for additional surgery.  He understands and accepts and agrees with our planned procedure.  PROCEDURE IN DETAIL:  After undergoing routine preop evaluation, the patient received prophylactic antibiotics and an interscalene block was established in the holding area by the Anesthesia Department.  Placed supine  on the operating table, underwent smooth induction of a general endotracheal anesthesia.  Placed into beach-chair position and appropriately padded and protected.  The right shoulder girdle region was sterilely prepped and draped in standard fashion.  Time-out was called.  Apparently, he was found to have 0 degrees of external rotation, 10 degrees of internal rotation, essentially locked shoulder. An anterior deltopectoral approach to the right shoulder was made through a 10 cm incision beginning at the coracoid and extending laterally and distally.  Skin flaps were elevated and electrocautery was used for hemostasis.  Dissection carried deeply.  Deltopectoral interval was developed, and the cephalic vein taken laterally with the deltoid. The upper centimeter of the pectoralis major was tenotomized to enhance exposure.  Divided adhesions beneath the deltoid.  Browne retractors placed.  The conjoined tendon was identified, mobilized, and retracted medially.  The biceps tendon was unroofed and tenotomized for later tenodesis and then we divided the rotator cuff along the rotator interval from the apex of the bicipital groove down towards the base of the coracoid, and then divided the subscap away from the lesser tuberosity leaving a lateral cuff of tissue approximately 1 cm in length for later repair.  Noted to have a very stiff shoulder and that we had to perform a significant release of the anterior and anterior-inferior and inferior capsular tissues and ultimately allowed [place] to successfully deliver the humeral head through the wound.  We placed an initial tag suture in the tenotomized margin of  the subscap, and at the completion of the case, placed additional sutures.  Once the humeral head was delivered, we then used a rongeur to remove the initial large osteophytes on the anterior inferior aspects of the head.  We then used an extramedullary guide to outline our proposed 135-degree  angled cut, and this was then performed with an oscillating saw at approximately 30 degrees of retroversion and the humeral head segment was then removed. 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  Care taken to protect the rotator cuff superior and posteriorly.  Once this was completed, we then removed the additional peripheral osteophytes from the proximal humerus.  The humeral canals were then prepared with hand reaming and we broached up to size 10 Apex implant.  A size 9 trial with a metal protector was placed over the cut surface of the proximal metaphysis.  We then turned our attention to the glenoid.  We performed a circumferential capsular release and circumferential labral resection and also carefully mobilized the subscapularis such that it was completely freed up and it certainly had been scarified for many years, but we were able to regain a good elasticity of the subscap muscle-tendon unit with a series of tag sutures on the tenotomized margin.  At this point, we then reappraised the exposure of the glenoid and this did show significant glenoid retroversion.  I palpated along the anterior scapular neck and identified the appropriate axial alignment and placed a guide pin in the center of the glenoid using a size medium guide and this allowed [place] to correct the marked retroversion and we went ahead and reamed with a size medium reamer, and ultimately, we were able to correct the severe retroversion and remove peripheral margins of bone and the overall contours much to our satisfaction.  Carefully used a rongeur to remove large osteophytes at the margins of the glenoid superiorly and anteriorly.  There were also several large loose bodies which were carefully removed.  At this point, we reamed our central drill hole and used the glenoid guide to  place our superior peg and inferior slotted fixation points.  We then punched for the glenoid and the glenoid trial showed excellent fit.  We irrigated the glenoid and meticulously cleaned and dried.  Cement was mixed.  Introduced cement into the superior and inferior orifices and our medium glenoid was impacted into position obtaining excellent fixation.  At this point, we then returned our attention to the proximal humerus, where we placed a drill hole through the metaphysis for #2 FiberWire to help assist with the repair of the subscap.  Our final implant was then obtained.  We placed a suture through the islets at the base of the implant, and then impacted our stem into the proximal humerus and then performed terminal seating of the implant which allowed flush fixation against the cut surface of the proximal humerus.  We then tightened the implant with the appropriate torque wrench.  Once the implant was properly seated, we then performed a series of trial reductions and ultimately found that the size 50 x 21 humeral head provided the best soft tissue balance, good congruency, and good soft tissue balance.  The [facility] taper was then carefully cleaned and dried.  The final head 50 x 21 was impacted.  Final reduction was performed and this did show indeed 50% translation of the humeral head  over the glenoid posteriorly.  At this point, we then repaired the subscapularis back to the lesser tuberosity using the previously placed #2 fiber wires and placed additional sutures through the inferior margin of the subscapularis using the sutures that had been placed on the neck of our implant and through the metaphysis, and we were able to gain excellent reapposition much to our satisfaction.  Also placed a pair of figure-of-eight #2 FiberWires through the rotator interval reapproximating the supraspinatus subscap nicely over the apex of the humeral head.  At the completion, there was some  moderate restriction in mobility in external rotation as would be anticipated given the chronic contractures, and we were able to achieve approximately 20 degrees of external rotation on the table.  At this point, we then performed a tenodesis of the long head biceps tendon at the upper level of pectoralis major tendon.  The wound was copiously irrigated.  Hemostasis was obtained.  The deltopectoral interval was then reapproximated with a series of figure-of-eight #1 Vicryl sutures, 2-0 Monocryl used for the subcu layer, and intracuticular 3-0 Monocryl for the skin, followed by Dermabond and Aquacel dressing.  Right arm was placed in a sling.  The patient was awakened, extubated, and taken to recovery room in stable condition.  Jenetta Loges, PA-C, was used as an Environmental consultant throughout this case essential for help with positioning the patient, positioning extremity, tissue manipulation, retraction, implantation of prosthesis, wound closure, and intraoperative decision making.     Metta Clines. Galileah Piggee, M.D.     KMS/MEDQ  D:  06/23/2015  T:  06/23/2015  Job:  [id number]

## 2015-06-23 NOTE — H&P (Signed)
[name] [name]    Chief Complaint: RIGHT SHOULDER OSTEOARTHRITIS HPI: The patient is a 64 y.o. male with end stage right shoulder OA  Past Medical History  Diagnosis Date  . Hyperlipidemia   . BPH with urinary obstruction   . Insomnia   . Barrett's esophagus     sees Dr. Delfin Edis   . History of hiatal hernia   . Arthritis   . Diverticulosis   . Esophageal dysmotility   . RMSF [facility] spotted fever) 2012  . Parkinson's disease [facility])     sees Dr. Wells Guiles Tat   . Complication of anesthesia     Urinary retention. "fights it"- is what he was told. 1990's - vomitted and aspirated in surgery  . History of kidney stones     15 in 2015- last one was in Oct 2015- takes HCTZ to help prevent  . GERD (gastroesophageal reflux disease)     not since [facility] Fund[facility]  . Lyme disease 2013    Past Surgical History  Procedure Laterality Date  . Cholecystectomy    . Tonsillectomy and adenoidectomy    . Esophagogastroduodenoscopy  06-24-13    per Dr. Olevia Perches, no signs of Barretts, repeat in 5 yrs   . Colonoscopy  06-24-13    per Dr. Olevia Perches, no polyps, diveticulosis only, repeat in 10 yrs   . Hernia repair Right     right inguinal  . Nissen fundoplication  0000000    laparoscopic per Dr. Johnathan Hausen  . Orchiopexy      right side at age 26   . Lipomas removed      x10/ on arms and legs  . Lasik      bilateral    Family History  Problem Relation Age of Onset  . Arthritis Mother   . Esophageal cancer Father   . Lung cancer Maternal Grandfather   . Esophageal cancer Paternal Grandmother   . Lung cancer Paternal Grandfather   . Colon cancer Sister     questionable  . Esophageal cancer Paternal Aunt     x 2  . Rectal cancer Neg Hx   . Stomach cancer Neg Hx   . Esophageal cancer Cousin     Social History:  reports that he has never smoked. He has never used smokeless tobacco. He reports that he does not drink alcohol or use illicit drugs.  Allergies:   Allergies  Allergen Reactions  . Penicillins Rash    Medications Prior to Admission  Medication Sig Dispense Refill  . atorvastatin (LIPITOR) 20 MG tablet TAKE 1 TABLET DAILY 90 tablet 0  . finasteride (PROSCAR) 5 MG tablet Take 5 mg by mouth daily.    . hydrochlorothiazide (MICROZIDE) 12.5 MG capsule TAKE 1 CAPSULE DAILY 90 capsule 3  . ibuprofen (ADVIL,MOTRIN) 800 MG tablet Take 800 mg by mouth every 8 (eight) hours as needed.    Marland Kitchen MIRAPEX ER 1.5 MG TB24 TAKE 1 TABLET DAILY 90 tablet 2  . polyethylene glycol (MIRALAX / GLYCOLAX) packet Take 17 g by mouth daily.    . Probiotic Product (PROBIOTIC DAILY PO) Take 1 capsule by mouth daily.    . tamsulosin (FLOMAX) 0.4 MG CAPS capsule Take 2 capsules (0.8 mg total) by mouth daily. 180 capsule 3  . temazepam (RESTORIL) 30 MG capsule Take 30 mg by mouth at bedtime as needed for sleep.       Physical Exam: right shoulder with severely restricted mobility as noted at recent office visits  Vitals  Temp:  [97.7 F (36.5 C)] 97.7 F (36.5 C) (12/08 0555) Pulse Rate:  [93] 93 (12/08 0555) BP: (142)/(72) 142/72 mmHg (12/08 0555) SpO2:  [96 %] 96 % (12/08 0555) Weight:  [87.091 kg (192 lb)] 87.091 kg (192 lb) (12/08 0555)  Assessment/Plan  Impression: RIGHT SHOULDER OSTEOARTHRITIS  Plan of Action: Procedure(s): RIGHT TOTAL SHOULDER ARTHROPLASTY POSSIBLE RIGHT REVERSE SHOULDER ARTHROPLASTY  Johnathon Olden M Rashid Whitenight 06/23/2015, 7:20 AM Contact # [phone]

## 2015-06-23 NOTE — Transfer of Care (Signed)
Immediate Anesthesia Transfer of Care Note  Patient: [name] [name]  Procedure(s) Performed: Procedure(s): RIGHT TOTAL SHOULDER ARTHROPLASTY (Right)  Patient Location: PACU  Anesthesia Type:General  Level of Consciousness: awake, alert  and oriented  Airway & Oxygen Therapy: Patient Spontanous Breathing and Patient connected to nasal cannula oxygen  Post-op Assessment: Report given to RN and Post -op Vital signs reviewed and stable  Post vital signs: Reviewed and stable  Last Vitals:  Filed Vitals:   06/23/15 0555  BP: 142/72  Pulse: 93  Temp: [id number] C    Complications: No apparent anesthesia complications

## 2015-06-23 NOTE — Anesthesia Procedure Notes (Addendum)
Anesthesia Regional Block:  Interscalene brachial plexus block  Pre-Anesthetic Checklist: ,, timeout performed, Correct Patient, Correct Site, Correct Laterality, Correct Procedure, Correct Position, site marked, Risks and benefits discussed,  Surgical consent,  Pre-op evaluation,  At surgeon's request and post-op pain management  Laterality: Upper and Right  Prep: chloraprep       Needles:  Injection technique: Single-shot  Needle Type: Echogenic Stimulator Needle          Additional Needles:  Procedures: ultrasound guided (picture in chart) and nerve stimulator Interscalene brachial plexus block  Nerve Stimulator or Paresthesia:  Response: deltoid, 0.5 mA,   Additional Responses:   Narrative:  Injection made incrementally with aspirations every 5 mL.  Performed by: Personally  Anesthesiologist: Oleta Mouse  Additional Notes: H+P and labs reviewed, risks and benefits discussed with patient, procedure tolerated well without complications   Procedure Name: Intubation Date/Time: 06/23/2015 7:35 AM Performed by: Rush Farmer E Pre-anesthesia Checklist: Patient identified, Emergency Drugs available, Suction available, Patient being monitored and Timeout performed Patient Re-evaluated:Patient Re-evaluated prior to inductionOxygen Delivery Method: Circle system utilized Preoxygenation: Pre-oxygenation with 100% oxygen Intubation Type: IV induction Ventilation: Mask ventilation without difficulty Laryngoscope Size: Mac and 4 Grade View: Grade I Tube type: Oral Tube size: 7.5 mm Number of attempts: 1 Airway Equipment and Method: Stylet Placement Confirmation: ETT inserted through vocal cords under direct vision,  positive ETCO2 and breath sounds checked- equal and bilateral Secured at: 21 cm Tube secured with: Tape Dental Injury: Teeth and Oropharynx as per pre-operative assessment

## 2015-06-23 NOTE — Discharge Instructions (Signed)
° °  Metta Clines. Supple, M.D., F.A.A.O.S. Orthopaedic Surgery Specializing in Arthroscopic and Reconstructive Surgery of the Shoulder and Knee [phone] [address] [place], [place] [address] - Fax [phone]   POST-OP TOTAL SHOULDER REPLACEMENT/SHOULDER HEMIARTHROPLASTY INSTRUCTIONS  1. Call the office at [phone] to schedule your first post-op appointment 10-14 days from the date of your surgery.  2. The bandage over your incision is waterproof. You may begin showering with this dressing on. You may leave this dressing on until first follow up appointment within 2 weeks. We prefer you leave this dressing in place until follow up however after 5-7 days if you are having itching or skin irritation and would like to remove it you may do so. Go slow and tug at the borders gently to break the bond the dressing has with the skin. At this point if there is no drainage it is okay to go without a bandage or you may cover it with a light guaze and tape. You can also expect significant bruising around your shoulder that will drift down your arm and into your chest wall. This is very normal and should resolve over several days.   3. Wear your sling/immobilizer at all times except to perform the exercises or to occasionally let your arm dangle by your side to stretch your elbow. You also need to sleep in your sling immobilizer until instructed otherwise.  4. Range of motion to your elbow, wrist, and hand are encouraged 3-5 times daily. Exercise to your hand and fingers helps to reduce swelling you may experience.  5. Utilize ice to the shoulder 3-5 times minimum a day and additionally if you are experiencing pain.  6. Prescriptions for a pain medication and a muscle relaxant are provided for you. It is recommended that if you are experiencing pain that you pain medication alone is not controlling, add the muscle relaxant along with the pain medication which can give additional pain  relief. The first 1-2 days is generally the most severe of your pain and then should gradually decrease. As your pain lessens it is recommended that you decrease your use of the pain medications to an "as needed basis'" only and to always comply with the recommended dosages of the pain medications.  7. Pain medications can produce constipation along with their use. If you experience this, the use of an over the counter stool softener or laxative daily is recommended.   8. For most patients, if insurance allows, home health services to include therapy has been arranged.  9. For additional questions or concerns, please do not hesitate to call the office. If after hours there is an answering service to forward your concerns to the physician on call.  POST-OP EXERCISES as instructed by the therapist prior to discharge

## 2015-06-23 NOTE — Anesthesia Preprocedure Evaluation (Addendum)
Anesthesia Evaluation  Patient identified by MRN, date of birth, ID band Patient awake    Reviewed: Allergy & Precautions, NPO status , Patient's Chart, lab work & pertinent test results  History of Anesthesia Complications (+) PONV and history of anesthetic complications  Airway Mallampati: I  TM Distance: >3 FB Neck ROM: Full    Dental  (+) Teeth Intact   Pulmonary neg pulmonary ROS,    breath sounds clear to auscultation       Cardiovascular hypertension, Pt. on medications  Rhythm:Regular     Neuro/Psych Parkinson's   Neuromuscular disease    GI/Hepatic Neg liver ROS, hiatal hernia, GERD  ,  Endo/Other  negative endocrine ROS  Renal/GU negative Renal ROS     Musculoskeletal  (+) Arthritis ,   Abdominal   Peds  Hematology negative hematology ROS (+)   Anesthesia Other Findings   Reproductive/Obstetrics                            Anesthesia Physical Anesthesia Plan  ASA: III  Anesthesia Plan: General   Post-op Pain Management: MAC Combined w/ Regional for Post-op pain   Induction: Intravenous  Airway Management Planned: Oral ETT  Additional Equipment: None  Intra-op Plan:   Post-operative Plan: Extubation in OR  Informed Consent: I have reviewed the patients History and Physical, chart, labs and discussed the procedure including the risks, benefits and alternatives for the proposed anesthesia with the patient or authorized representative who has indicated his/her understanding and acceptance.   Dental advisory given  Plan Discussed with: Anesthesiologist and CRNA  Anesthesia Plan Comments:        Anesthesia Quick Evaluation

## 2015-06-24 ENCOUNTER — Encounter (HOSPITAL_COMMUNITY): Payer: Self-pay | Admitting: Orthopedic Surgery

## 2015-06-24 DIAGNOSIS — M19011 Primary osteoarthritis, right shoulder: Secondary | ICD-10-CM | POA: Diagnosis not present

## 2015-06-24 MED ORDER — OXYCODONE-ACETAMINOPHEN 5-325 MG PO TABS: 1.0000 | ORAL_TABLET | ORAL | Status: DC | PRN

## 2015-06-24 MED ORDER — METHOCARBAMOL 500 MG PO TABS: 500.0000 mg | ORAL_TABLET | Freq: Four times a day (QID) | ORAL | Status: DC

## 2015-06-24 MED ORDER — ONDANSETRON HCL 4 MG PO TABS: 4.0000 mg | ORAL_TABLET | Freq: Three times a day (TID) | ORAL | Status: DC | PRN

## 2015-06-24 NOTE — Evaluation (Signed)
Occupational Therapy Evaluation Patient Details Name: [name] [name] MRN: [id number] DOB: September 02, 1950 Today's Date: 06/24/2015    History of Present Illness Pt is a 64 y.o. male s/p RIGHT TOTAL SHOULDER ARTHROPLASTY on 06/23/2015. PMH: Parkinson's disease, Lyme disease, GERD    Clinical Impression   Pt reports he was independent with ADLs and mobility PTA. Currently pt is overall supervision for functional mobility and ADLs with the exception of min assist for UB ADLs. All shoulder, ADL, and home safety education complete; pt with no further questions or concerns for OT at this time. Pt planning to d/c home with 24/7 supervision from his wife. Pt ready to d/c from an OT standpoint; RN notified. Will continue to follow for increased independence with RUE exercises.     Follow Up Recommendations  Other (comment);Supervision - Intermittent (follow up per MD)    Equipment Recommendations  None recommended by OT    Recommendations for Other Services       Precautions / Restrictions Precautions Precautions: Shoulder Type of Shoulder Precautions: Conservative Protocol: NO pendulums, AROM elbow, wrist, hand OK. PROM shoulder ff 60, ABD 45, ER 10 Shoulder Interventions: Shoulder sling/immobilizer;At all times;Off for dressing/bathing/exercises Precaution Booklet Issued: Yes (comment) Precaution Comments: Reviewed precautions with pt Required Braces or Orthoses: Sling Restrictions Weight Bearing Restrictions: Yes RUE Weight Bearing: Non weight bearing      Mobility Bed Mobility Overal bed mobility: Modified Independent             General bed mobility comments: VC for NWB through RUE; pt able to maintain NWB throughout bed mobility.  Transfers Overall transfer level: Needs assistance Equipment used: None Transfers: Sit to/from Stand Sit to Stand: Supervision         General transfer comment: Supervision for safety; no physical assist needed. Good hand placement and  technique. Sit to stand from EOB x 5, toilet x 1    Balance Overall balance assessment: No apparent balance deficits (not formally assessed)                                          ADL Overall ADL's : Needs assistance/impaired Eating/Feeding: Set up;Sitting   Grooming: Wash/dry hands;Supervision/safety;Standing   Upper Body Bathing: Minimal assitance;Sitting;Standing Upper Body Bathing Details (indicate cue type and reason): Educated on UB bathing technique Lower Body Bathing: Supervison/ safety;Sit to/from stand   Upper Body Dressing : Minimal assistance;Sitting;Standing Upper Body Dressing Details (indicate cue type and reason): Educated on UB dressing technique; pt able to retrun demonstrate understanding. Lower Body Dressing: Supervision/safety;Sit to/from stand Lower Body Dressing Details (indicate cue type and reason): Pt able to don pants with supervision for safety with sit to stand Toilet Transfer: Supervision/safety;Ambulation;Comfort height toilet   Toileting- Clothing Manipulation and Hygiene: Supervision/safety;Sit to/from stand   Tub/ Shower Transfer: Supervision/safety;Walk-in shower;Ambulation;Shower seat   Functional mobility during ADLs: Supervision/safety General ADL Comments: Wife present at end of session; educated on PROM shoulder exercises; wife able to retrun demonstrate understanding. Educated on sling management and wear schedule, edema management techniques, need for supervision for safety during ADLs and mobility, precautions, RUE positioning, RUE exercises; pt verbalized understanding.     Vision     Perception     Praxis      Pertinent Vitals/Pain Pain Assessment: 0-10 Pain Score: 6  Pain Location: R shoulder Pain Descriptors / Indicators: Grimacing;Sore Pain Intervention(s): Limited activity within patient's tolerance;Monitored during  session;Repositioned;Ice applied     Hand Dominance Right   Extremity/Trunk Assessment  Upper Extremity Assessment Upper Extremity Assessment: RUE deficits/detail RUE Deficits / Details: AROM elbow, wrist, hand WFL. Limited shoulder ROM  RUE: Unable to fully assess due to pain;Unable to fully assess due to immobilization   Lower Extremity Assessment Lower Extremity Assessment: Overall WFL for tasks assessed   Cervical / Trunk Assessment Cervical / Trunk Assessment: Normal   Communication Communication Communication: No difficulties   Cognition Arousal/Alertness: Awake/alert Behavior During Therapy: WFL for tasks assessed/performed Overall Cognitive Status: Within Functional Limits for tasks assessed                     General Comments       Exercises Exercises: Shoulder     Shoulder Instructions Shoulder Instructions Donning/doffing shirt without moving shoulder: Minimal assistance;Patient able to independently direct caregiver (education completed) Method for sponge bathing under operated UE: Minimal assistance;Patient able to independently direct caregiver (education completed) Donning/doffing sling/immobilizer: Minimal assistance;Patient able to independently direct caregiver (education completed) Correct positioning of sling/immobilizer: Supervision/safety;Patient able to independently direct caregiver (education completed) ROM for elbow, wrist and digits of operated UE: Supervision/safety;Patient able to independently direct caregiver (education completed) Sling wearing schedule (on at all times/off for ADL's): Modified independent (education completed) Proper positioning of operated UE when showering: Supervision/safety;Patient able to independently direct caregiver (education completed) Positioning of UE while sleeping: Minimal assistance;Patient able to independently direct caregiver (education completed)    [place] expects to be discharged to:: Private residence Living Arrangements: Spouse/significant other Available Help at  Discharge: Family;Available 24 hours/day Type of Home: House Home Access: Stairs to enter [employer] of Steps: 3-4   Home Layout: Multi-level;Able to live on main level with bedroom/bathroom     Bathroom Shower/Tub: Occupational psychologist: Standard Bathroom Accessibility: Yes   Home Equipment: Shower seat - built in          Prior Functioning/Environment Level of Independence: Independent        Comments: Pt very active, works outside on his property    OT Diagnosis: Acute pain   OT Problem List: Decreased range of motion;Decreased knowledge of precautions;Pain   OT Treatment/Interventions: Self-care/ADL training;Patient/family education;Therapeutic exercise    OT Goals(Current goals can be found in the care plan section) Acute Rehab OT Goals Patient Stated Goal: to go home today OT Goal Formulation: With patient Time For Goal Achievement: 07/08/15 Potential to Achieve Goals: Good ADL Goals Pt/caregiver will Perform Home Exercise Program: Increased ROM;Right Upper extremity;With written HEP provided;With minimal assist (with caregiver independent in assisting with R shoulder PROM)  OT Frequency: Min 2X/week   Barriers to D/C:            Co-evaluation              End of Session Equipment Utilized During Treatment: Other (comment) (sling) Nurse Communication: Other (comment) (pt ready to d/c from OT standpoint)  Activity Tolerance: Patient tolerated treatment well Patient left: in chair;with call bell/phone within reach;with family/visitor present   Time: [id number] OT Time Calculation (min): 44 min Charges:  OT General Charges $OT Visit: 1 Procedure OT Evaluation $Initial OT Evaluation Tier I: 1 Procedure OT Treatments $Self Care/Home Management : 8-22 mins $Therapeutic Exercise: 8-22 mins G-Codes: OT G-codes **NOT FOR INPATIENT CLASS** Functional Assessment Tool Used: Clinical judgement  Functional Limitation: Self  care Self Care Current Status [id number]): At least 1 percent but less than 20 percent impaired, limited  or restricted Self Care Goal Status [phone]): At least 1 percent but less than 20 percent impaired, limited or restricted   Binnie Kand M.S., OTR/L Pager: [phone]  06/24/2015, 10:05 AM

## 2015-06-24 NOTE — Discharge Summary (Signed)
PATIENT ID:      [name] [name]  MRN:     [id number] DOB/AGE:    08/31/1950 / 64 y.o.     DISCHARGE SUMMARY  ADMISSION DATE:    06/23/2015 DISCHARGE DATE:    ADMISSION DIAGNOSIS: RIGHT SHOULDER OSTEOARTHRITIS Past Medical History  Diagnosis Date  . Hyperlipidemia   . BPH with urinary obstruction   . Insomnia   . Barrett's esophagus     sees Dr. Delfin [name]   . History of hiatal hernia   . Arthritis   . Diverticulosis   . Esophageal dysmotility   . RMS[facility] spotted fever) 2012  . Parkinson's disease [facility])     sees Dr. Wells Guiles [name]   . Complication of anesthesia     Urinary retention. "fights it"- is what he was told. 1990's - vomitted and aspirated in surgery  . History of kidney stones     15 in 2015- last one was in Oct 2015- takes HCTZ to help prevent  . GERD (gastroesophageal reflux disease)     not since [facility]  . Lyme disease 2013    DISCHARGE DIAGNOSIS:   Active Problems:   S/P shoulder replacement   PROCEDURE: Procedure(s): RIGHT TOTAL SHOULDER ARTHROPLASTY on 06/23/2015  CONSULTS:     HISTORY:  See H&P in chart.  HOSPITAL COURSE:  [name] [name] is a 64 y.o. admitted on 06/23/2015 with a chief complaint of right shoulder pain refractory to conservative treatment, and found to have a diagnosis of RIGHT SHOULDER OSTEOARTHRITIS.  They were brought to the operating room on 06/23/2015 and underwent Procedure(s): RIGHT TOTAL SHOULDER ARTHROPLASTY.    They were given perioperative antibiotics: Anti-infectives    Start     Dose/Rate Route Frequency Ordered Stop   06/23/15 1600  ceFAZolin (ANCEF) IVPB 2 g/50 mL premix     2 g 100 mL/hr over 30 Minutes Intravenous Every 6 hours 06/23/15 1538 06/24/15 0527   06/23/15 0700  ceFAZolin (ANCEF) IVPB 2 g/50 mL premix     2 g 100 mL/hr over 30 Minutes Intravenous To [facility] 06/22/15 1317 06/23/15 0747    .  Patient underwent the above named procedure and tolerated it well. The  following day they were hemodynamically stable and pain was controlled on oral analgesics. They were neurovascularly intact to the operative extremity. OT was ordered and worked with patient per protocol. They were medically and orthopaedically stable for discharge on day 1.    DIAGNOSTIC STUDIES:  RECENT RADIOGRAPHIC STUDIES :  No results found.  RECENT VITAL SIGNS:  Patient Vitals for the past 24 hrs:  BP Temp Temp src Pulse Resp SpO2  06/24/15 0457 105/68 mmHg 98.4 F (36.9 C) Oral 82 16 96 %  06/24/15 0014 94/60 mmHg 98.2 F (36.8 C) Oral 76 16 95 %  06/23/15 2010 103/61 mmHg 98 F (36.7 C) Oral 82 16 96 %  06/23/15 1537 97/62 mmHg 98.1 F (36.7 C) - 73 18 95 %  06/23/15 1500 98/65 mmHg 97.7 F (36.5 C) - 68 18 97 %  06/23/15 1430 99/68 mmHg - - 72 16 97 %  06/23/15 1400 98/64 mmHg - - 78 (!) 24 98 %  06/23/15 1330 (!) 95/59 mmHg - - 67 (!) 22 99 %  06/23/15 1300 (!) 91/54 mmHg - - 82 16 98 %  06/23/15 1230 (!) 89/56 mmHg - - 64 19 98 %  06/23/15 1200 (!) 90/56 mmHg - - 67 19 98 %  06/23/15 1145 92/62 mmHg - - 71 (!) 23 97 %  06/23/15 1130 (!) 87/59 mmHg - - 70 (!) 25 98 %  06/23/15 1115 (!) 91/57 mmHg - - 77 18 96 %  06/23/15 1100 91/60 mmHg - - 75 18 96 %  06/23/15 1045 92/61 mmHg - - 78 (!) 22 95 %  06/23/15 1030 (!) 91/57 mmHg 97.8 F (36.6 C) - 80 - 96 %  .  RECENT EKG RESULTS:    Orders placed or performed in visit on 06/03/15  . EKG 12-Lead    DISCHARGE INSTRUCTIONS:    DISCHARGE MEDICATIONS:     Medication List    TAKE these medications        atorvastatin 20 MG tablet  Commonly known as:  LIPITOR  TAKE 1 TABLET DAILY     finasteride 5 MG tablet  Commonly known as:  PROSCAR  Take 5 mg by mouth daily.     hydrochlorothiazide 12.5 MG capsule  Commonly known as:  MICROZIDE  TAKE 1 CAPSULE DAILY     ibuprofen 800 MG tablet  Commonly known as:  ADVIL,MOTRIN  Take 800 mg by mouth every 8 (eight) hours as needed.     methocarbamol 500 MG tablet   Commonly known as:  ROBAXIN  Take 1 tablet (500 mg total) by mouth 4 (four) times daily.     MIRAPEX ER 1.5 MG Tb24  Generic drug:  Pramipexole Dihydrochloride  TAKE 1 TABLET DAILY     ondansetron 4 MG tablet  Commonly known as:  ZOFRAN  Take 1 tablet (4 mg total) by mouth every 8 (eight) hours as needed for nausea or vomiting.     oxyCODONE-acetaminophen 5-325 MG tablet  Commonly known as:  PERCOCET  Take 1-2 tablets by mouth every 4 (four) hours as needed.     polyethylene glycol packet  Commonly known as:  MIRALAX / GLYCOLAX  Take 17 g by mouth daily.     PROBIOTIC DAILY PO  Take 1 capsule by mouth daily.     tamsulosin 0.4 MG Caps capsule  Commonly known as:  FLOMAX  Take 2 capsules (0.8 mg total) by mouth daily.     temazepam 30 MG capsule  Commonly known as:  RESTORIL  Take 30 mg by mouth at bedtime as needed for sleep.        FOLLOW UP VISIT:       Follow-up Information    Follow up with [name] Clines SUPPLE, MD.   Specialty:  Orthopedic Surgery   Why:  call to be seen in 10-14 days   Contact information:   [address] [place] [address] [id number]       DISCHARGE TO: Home   DISPOSITION: Good   DISCHARGE CONDITION:  [name] [name] for Dr. Justice [name] 06/24/2015, 7:47 AM

## 2015-06-28 NOTE — Anesthesia Postprocedure Evaluation (Signed)
Anesthesia Post Note  Patient: [name] [name]  Procedure(s) Performed: Procedure(s) (LRB): RIGHT TOTAL SHOULDER ARTHROPLASTY (Right)  Patient location during evaluation: PACU Anesthesia Type: General and Regional Level of consciousness: awake Pain management: pain level controlled Vital Signs Assessment: post-procedure vital signs reviewed and stable Respiratory status: spontaneous breathing Cardiovascular status: stable Postop Assessment: no signs of nausea or vomiting Anesthetic complications: no    Last Vitals:  Filed Vitals:   06/24/15 0014 06/24/15 0457  BP: 94/60 105/68  Pulse: 76 82  Temp: 36.8 C 36.9 C  Resp: 16 16    Last Pain:  Filed Vitals:   06/24/15 1202  PainSc: 2                  [name]

## 2015-07-06 ENCOUNTER — Encounter (HOSPITAL_COMMUNITY): Payer: Self-pay | Admitting: Orthopedic Surgery

## 2015-07-15 ENCOUNTER — Other Ambulatory Visit: Payer: Self-pay | Admitting: Family Medicine

## 2015-07-28 ENCOUNTER — Ambulatory Visit (INDEPENDENT_AMBULATORY_CARE_PROVIDER_SITE_OTHER): Admitting: Neurology

## 2015-07-28 ENCOUNTER — Encounter: Payer: Self-pay | Admitting: Neurology

## 2015-07-28 VITALS — BP 126/70 | HR 92 | Ht 71.0 in | Wt 196.0 lb

## 2015-07-28 DIAGNOSIS — G20 Parkinson's disease: Secondary | ICD-10-CM | POA: Diagnosis not present

## 2015-07-28 DIAGNOSIS — M129 Arthropathy, unspecified: Secondary | ICD-10-CM | POA: Diagnosis not present

## 2015-07-28 DIAGNOSIS — M19011 Primary osteoarthritis, right shoulder: Secondary | ICD-10-CM

## 2015-07-28 DIAGNOSIS — G4700 Insomnia, unspecified: Secondary | ICD-10-CM | POA: Diagnosis not present

## 2015-07-28 MED ORDER — CLONAZEPAM 0.5 MG PO TABS: 0.2500 mg | ORAL_TABLET | Freq: Every day | ORAL | Status: DC

## 2015-07-28 NOTE — Progress Notes (Signed)
[name] [name] was seen today in the movement disorders clinic for neurologic consultation at the request of FRY,[name], MD.  The consultation is for the evaluation of tremor.  Tremor started 1 year ago, but it has been getting worse since October.  The pt is R hand dominant.  The pt has noted worsening.  At first, it was positional and intermittent but that has changed.  It is more constant now.  He notices it at rest.  He can make it stop for a short period of time by concentrating on it.    11/18/13 update:  Pt presents today for follow up, accompanied by his wife who supplements the history.  She was not present last visit.  Pt states that he is doing better on the Mirapex.  He feels that he is about 98% better.  He denies SE with the medication.  No compulsive behaviors are noted.  Feeling well.  Exercising.  No falls.  No hallucinations.  Tremor feels like it is just under the surface but not visible.  No lightheadedness.    02/17/14 update:  Pt is f/u today regarding PD.  He is feeling great on mirapex er, 1.5 mg daily.  Feels sleepy if he relaxes but isn't able to sleep if he tries.  Pt states that he rarely feels/sees tremor.  Balance has been good.  Exercising faithfully.  Has had several kidney stones and asks if related to mirapex.  No hallucinations.  No lightheadedness.    06/22/14 update:  Pt is f/u today re: PD.  Doing well on mirapex ER 1.5 mg daily.  Good balance.  No falls.  No hallucinations. No compulsive behaviors.  Exercising a lot.  States that most day he wouldn't even know he had PD.   The records that were made available to me were reviewed.  Been on several abx for prostatitis.  Saw that he called pcp for phenergan suppositories but states that he only uses it rarely as he had a nissen fundoplication.  He has only used one.  Had derm appt and no evidence of CA.  10/25/14 update:  Pt is accompanied by his wife who supplements the history.  He states that it is "time" to  replace the shoulder.  He has it scheduled but not until December 1.  He isn't sure if it isn't under general anesthesia.  He was prescribed phenergan but he almost never uses it (twice in three years).  He will have no tremor for days to weeks and then may have some tremor.  He has some EDS when he stops when he sits and relaxes.  States if he tried to take a nap, though, he won't be able to sleep.    He doesn't snore at night.  He does take temazepam for sleep at night.  He notices balance isn't quite as good as it used to be.  He hasn't fallen.   He exercises faithfully.  He asks about DBS therapy.  03/14/15 update:  Pt is f/u today.  He is doing well from a PD standpoint.  He remains on mirapex er, 1.5 mg daily.  He is having his shoulder replaced in December.  He is going to be under general anesthesia for surgery.  He states that he had no tremor from last visit until last week, but thinks that was mostly thinking about this visit.  It actually went away over the last few days.  He notes that pain in the  shoulder and coldness in the grocery store will bring out the tremor.  He states that he had 2 episodes of anxiety with driving in [place] and in [place] that he was unusual as he knows [place] well (lived there for a long time).  Still trouble with sleeping despite temazepam and has to be "careful" with that because it causes him to sleep eat.   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    07/28/15 update:  The patient is following up today.  He remains on pramipexole ER, 1.5 mg daily.  He is doing well in that regard.  Tremor has been minimal.  No falls.  "My balance is perfect."  No hallucinations.  No lightheadedness or near syncope.  He went off temazepam for insomnia (states that was "brutal" and did that b/c he was binge eating at night) and last visit I told him to add melatonin, 3 mg. He tried that and it didn't help.  He is doing some better but  asks me about what medication he can try instead.   I did review records since our last visit.  He had a shoulder replacement on the right on December 8.  He is restricted to no movement for 5 weeks but states that he has had little pain since the surgery.  Exercise has been a little minimal because of this.  Going to Dr. Nelva Bush for sciatica and plans to have injection on the L.  Neuroimaging has not previously been performed.   PREVIOUS MEDICATIONS: none to date  ALLERGIES:   Allergies  Allergen Reactions  . Penicillins Rash    CURRENT MEDICATIONS:  Current Outpatient Prescriptions on File Prior to Visit  Medication Sig Dispense Refill  . atorvastatin (LIPITOR) 20 MG tablet TAKE 1 TABLET DAILY 90 tablet 0  . finasteride (PROSCAR) 5 MG tablet Take 5 mg by mouth daily.    . hydrochlorothiazide (MICROZIDE) 12.5 MG capsule TAKE 1 CAPSULE DAILY 90 capsule 3  . MIRAPEX ER 1.5 MG TB24 TAKE 1 TABLET DAILY 90 tablet 2  . polyethylene glycol (MIRALAX / GLYCOLAX) packet Take 17 g by mouth daily.    . Probiotic Product (PROBIOTIC DAILY PO) Take 1 capsule by mouth daily.    . tamsulosin (FLOMAX) 0.4 MG CAPS capsule Take 2 capsules (0.8 mg total) by mouth daily. 180 capsule 3   No current facility-administered medications on file prior to visit.    PAST MEDICAL HISTORY:   Past Medical History  Diagnosis Date  . Hyperlipidemia   . BPH with urinary obstruction   . Insomnia   . Barrett's esophagus     sees Dr. Delfin Edis   . History of hiatal hernia   . Arthritis   . Diverticulosis   . Esophageal dysmotility   . RMSF [facility] spotted fever) 2012  . Parkinson's disease [facility])     sees Dr. Wells Guiles Tat   . Complication of anesthesia     Urinary retention. "fights it"- is what he was told. 1990's - vomitted and aspirated in surgery  . History of kidney stones     15 in 2015- last one was in Oct 2015- takes HCTZ to help prevent  . GERD (gastroesophageal reflux disease)     not since  Nessen Fundopliation  . Lyme disease 2013    PAST SURGICAL HISTORY:   Past Surgical History  Procedure Laterality Date  . Cholecystectomy    .  Tonsillectomy and adenoidectomy    . Esophagogastroduodenoscopy  06-24-13    per Dr. Olevia Perches, no signs of Barretts, repeat in 5 yrs   . Colonoscopy  06-24-13    per Dr. Olevia Perches, no polyps, diveticulosis only, repeat in 10 yrs   . Hernia repair Right     right inguinal  . Nissen fundoplication  0000000    laparoscopic per Dr. Johnathan Hausen  . Orchiopexy      right side at age 89   . Lipomas removed      x10/ on arms and legs  . Lasik      bilateral  . Total shoulder arthroplasty Right 06/23/2015    Procedure: RIGHT TOTAL SHOULDER ARTHROPLASTY;  Surgeon: Justice Britain, MD;  Location: [place];  Service: Orthopedics;  Laterality: Right;    SOCIAL HISTORY:   Social History   Social History  . Marital Status: Married    Spouse Name: N/A  . Number of Children: N/A  . Years of Education: N/A   Occupational History  . Not on file.   Social History Main Topics  . Smoking status: Never Smoker   . Smokeless tobacco: Never Used  . Alcohol Use: No  . Drug Use: No  . Sexual Activity: Not on file   Other Topics Concern  . Not on file   Social History Narrative    FAMILY HISTORY:   Family Status  Relation Status Death Age  . Mother Alive 24    rheumatoid arthritis  . Father Deceased 57    esophageal cancer  . Maternal Grandfather Deceased     esophagel cancer  . Paternal Grandmother Deceased   . Paternal Grandfather Deceased   . Sister Alive   . Brother Alive   . Daughter Alive   . Maternal Grandmother Deceased   . Sister Alive   . Sister Alive   . Sister Alive   . Daughter Alive     ROS:  A complete 10 system review of systems was obtained and was unremarkable apart from what is mentioned above.  PHYSICAL EXAMINATION:    VITALS:   Filed Vitals:   07/28/15 1039  BP: 126/70  Pulse: 92  Height: 5\' 11"  (1.803 m)    Weight: 196 lb (88.905 kg)   Wt Readings from Last 3 Encounters:  07/28/15 196 lb (88.905 kg)  06/23/15 192 lb (87.091 kg)  06/14/15 192 lb 4.8 oz (87.227 kg)     GEN:  The patient appears stated age and is in NAD. HEENT:  Normocephalic, atraumatic.  The mucous membranes are moist. The superficial temporal arteries are without ropiness or tenderness. CV:  RRR Lungs:  CTAB Neck/HEME:  There are no carotid bruits bilaterally.  Neurological examination:  Orientation: The patient is alert and oriented x3. Fund of knowledge is appropriate.  Recent and remote memory are intact.  Attention and concentration are normal.    Able to name objects and repeat phrases. Cranial nerves: There is good facial symmetry. Pupils are equal round and reactive to light bilaterally. Fundoscopic exam reveals clear margins bilaterally. Extraocular muscles are intact. The visual fields are full to confrontational testing. The speech is fluent and clear. Soft palate rises symmetrically and there is no tongue deviation. Hearing is intact to conversational tone. Sensation: Sensation is intact to light touch throughout. Motor: Strength is 5/5 in the bilateral upper and lower extremities.   Shoulder shrug is equal and symmetric.  There is no pronator drift.  No fasciculations.  Movement examination:  Tone: There is normal tone in the LUE (cannot test on the RUE due to sling and told not able to move it) Abnormal movements: Rare tremor in the RUE Coordination:  There is no decremation with RAMs today with finger taps bilaterally, hand opening and closing bilaterally, heel taps, toe taps bilaterally Gait and Station: The patient has no difficulty arising out of a deep-seated chair without the use of the hands. The patient's stride length is normal with good arm swing today on the left (right in sling).    LABS  Lab Results  Component Value Date   TSH 1.58 05/27/2015     Chemistry      Component Value Date/Time    NA 141 06/23/2015 0553   K 3.4* 06/23/2015 0553   CL 103 06/23/2015 0553   CO2 29 06/23/2015 0553   BUN 9 06/23/2015 0553   CREATININE 0.73 06/23/2015 0553      Component Value Date/Time   CALCIUM 9.3 06/23/2015 0553   ALKPHOS 72 06/23/2015 0553   AST 24 06/23/2015 0553   ALT 28 06/23/2015 0553   BILITOT 2.9* 06/23/2015 0553       ASSESSMENT/PLAN:  1. Parkinsons disease, Hoehn and Yahr stage 1.  -Doing well on mirapex ER - 1.5 mg daily and we will continue that. Risks, benefits, side effects and alternative therapies were discussed.  The opportunity to ask questions was given and they were answered to the best of my ability.  The patient expressed understanding and willingness to follow the outlined treatment protocols.  -continue with faithful exercise.  Talked about emerging therapies and safety with PD.   2.  Right shoulder osteoarthritis  -s/p R shoulder replacement in 06/2015 but unable to move it yet.  Hoping to start exercise soon. 3.  Insomnia  -add klonopin 0.5 mg - 1/2 q hs 4.  Sciatica  -seeing Dr. Nelva Bush and plans to have injection.  Asked pt to have him get me notes.  Told he didn't have bad discs but didn't have MRI 5. Follow up is anticipated in the next few months, sooner should new neurologic issues arise.

## 2015-09-16 ENCOUNTER — Telehealth: Payer: Self-pay | Admitting: Family Medicine

## 2015-09-16 NOTE — Telephone Encounter (Signed)
He should follow up with Dr. Nelva Bush to discuss alternative therapies (radiofrequency ablation, etc.)

## 2015-09-16 NOTE — Telephone Encounter (Signed)
Pt wife said the last shot that her husband had with Dr Nelva Bush did not help with his sciatica. Wife is asking who should he see next since the injections does not seem to be working

## 2015-09-19 NOTE — Telephone Encounter (Signed)
I left a voice message with below information. 

## 2015-09-28 ENCOUNTER — Other Ambulatory Visit: Payer: Self-pay | Admitting: Neurology

## 2015-09-28 NOTE — Telephone Encounter (Signed)
Mirapex refill requested. Per last office note- patient to remain on medication. Refill approved and sent to patient's pharmacy.   

## 2015-10-11 ENCOUNTER — Other Ambulatory Visit: Payer: Self-pay | Admitting: Family Medicine

## 2015-12-05 ENCOUNTER — Encounter: Payer: Self-pay | Admitting: Neurology

## 2015-12-05 ENCOUNTER — Ambulatory Visit (INDEPENDENT_AMBULATORY_CARE_PROVIDER_SITE_OTHER): Admitting: Neurology

## 2015-12-05 VITALS — BP 122/80 | HR 84 | Ht 71.0 in | Wt 202.0 lb

## 2015-12-05 DIAGNOSIS — M5432 Sciatica, left side: Secondary | ICD-10-CM

## 2015-12-05 DIAGNOSIS — G20 Parkinson's disease: Secondary | ICD-10-CM

## 2015-12-05 NOTE — Progress Notes (Signed)
[name] [name] was seen today in the movement disorders clinic for neurologic consultation at the request of FRY,[name], MD.  The consultation is for the evaluation of tremor.  Tremor started 1 year ago, but it has been getting worse since October.  The pt is R hand dominant.  The pt has noted worsening.  At first, it was positional and intermittent but that has changed.  It is more constant now.  He notices it at rest.  He can make it stop for a short period of time by concentrating on it.    11/18/13 update:  Pt presents today for follow up, accompanied by his wife who supplements the history.  She was not present last visit.  Pt states that he is doing better on the Mirapex.  He feels that he is about 98% better.  He denies SE with the medication.  No compulsive behaviors are noted.  Feeling well.  Exercising.  No falls.  No hallucinations.  Tremor feels like it is just under the surface but not visible.  No lightheadedness.    02/17/14 update:  Pt is f/u today regarding PD.  He is feeling great on mirapex er, 1.5 mg daily.  Feels sleepy if he relaxes but isn't able to sleep if he tries.  Pt states that he rarely feels/sees tremor.  Balance has been good.  Exercising faithfully.  Has had several kidney stones and asks if related to mirapex.  No hallucinations.  No lightheadedness.    06/22/14 update:  Pt is f/u today re: PD.  Doing well on mirapex ER 1.5 mg daily.  Good balance.  No falls.  No hallucinations. No compulsive behaviors.  Exercising a lot.  States that most day he wouldn't even know he had PD.   The records that were made available to me were reviewed.  Been on several abx for prostatitis.  Saw that he called pcp for phenergan suppositories but states that he only uses it rarely as he had a nissen fundoplication.  He has only used one.  Had derm appt and no evidence of CA.  10/25/14 update:  Pt is accompanied by his wife who supplements the history.  He states that it is "time" to  replace the shoulder.  He has it scheduled but not until December 1.  He isn't sure if it isn't under general anesthesia.  He was prescribed phenergan but he almost never uses it (twice in three years).  He will have no tremor for days to weeks and then may have some tremor.  He has some EDS when he stops when he sits and relaxes.  States if he tried to take a nap, though, he won't be able to sleep.    He doesn't snore at night.  He does take temazepam for sleep at night.  He notices balance isn't quite as good as it used to be.  He hasn't fallen.   He exercises faithfully.  He asks about DBS therapy.  03/14/15 update:  Pt is f/u today.  He is doing well from a PD standpoint.  He remains on mirapex er, 1.5 mg daily.  He is having his shoulder replaced in December.  He is going to be under general anesthesia for surgery.  He states that he had no tremor from last visit until last week, but thinks that was mostly thinking about this visit.  It actually went away over the last few days.  He notes that pain in the  shoulder and coldness in the grocery store will bring out the tremor.  He states that he had 2 episodes of anxiety with driving in [place] and in [place] that he was unusual as he knows [place] well (lived there for a long time).  Still trouble with sleeping despite temazepam and has to be "careful" with that because it causes him to sleep eat.   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    07/28/15 update:  The patient is following up today.  He remains on pramipexole ER, 1.5 mg daily.  He is doing well in that regard.  Tremor has been minimal.  No falls.  "My balance is perfect."  No hallucinations.  No lightheadedness or near syncope.  He went off temazepam for insomnia (states that was "brutal" and did that b/c he was binge eating at night) and last visit I told him to add melatonin, 3 mg. He tried that and it didn't help.  He is doing some better but  asks me about what medication he can try instead.   I did review records since our last visit.  He had a shoulder replacement on the right on December 8.  He is restricted to no movement for 5 weeks but states that he has had little pain since the surgery.  Exercise has been a little minimal because of this.  Going to Dr. Nelva Bush for sciatica and plans to have injection on the L.  12/05/15 update:  The patient is following up today.  He remains on pramipexole ER, 1.5 mg daily.  He is doing well in that regard.  No falls.  No hallucinations.  No lightheadedness or near syncope.  We had started clonazepam last visit for insomnia, and he states that it made him groggy and he d/c that.   He had a shoulder replacement in December, and when I saw him last, he still was not able to move that.  The patient reports that he has actually recovered nicely from that surgery.  I did review records since our last visit.  Dr. Nelva Bush sent me know that the patient did have an MRI of his lumbar spine that demonstrated mild degenerative changes.  He was given gabapentin, but according to notes, the patient did not want to take that until he talked to me.  The patient felt that side effects were similar to Parkinson's disease, although Dr. Nelva Bush told him that the biggest side effects or legs swelling and cognitive changes.  Pt states that it is keeping him awake most nights and it is worse when he sits down and it radiates down the L leg.  He is still having insomnia.  Neuroimaging has not previously been performed.   PREVIOUS MEDICATIONS: klonopin for insomnia made groggy  ALLERGIES:   Allergies  Allergen Reactions  . Penicillins Rash    CURRENT MEDICATIONS:  Current Outpatient Prescriptions on File Prior to Visit  Medication Sig Dispense Refill  . atorvastatin (LIPITOR) 20 MG tablet TAKE 1 TABLET DAILY 90 tablet 2  . finasteride (PROSCAR) 5 MG tablet Take 5 mg by mouth daily.    . hydrochlorothiazide (MICROZIDE) 12.5 MG  capsule TAKE 1 CAPSULE DAILY 90 capsule 3  . MIRAPEX ER 1.5 MG TB24 TAKE 1 TABLET DAILY 90 tablet 1  . polyethylene glycol (MIRALAX / GLYCOLAX) packet Take 17 g by mouth daily.    . Probiotic Product (PROBIOTIC DAILY PO) Take 1  capsule by mouth daily.    . tamsulosin (FLOMAX) 0.4 MG CAPS capsule Take 2 capsules (0.8 mg total) by mouth daily. 180 capsule 3   No current facility-administered medications on file prior to visit.    PAST MEDICAL HISTORY:   Past Medical History  Diagnosis Date  . Hyperlipidemia   . BPH with urinary obstruction   . Insomnia   . Barrett's esophagus     sees Dr. Delfin Edis   . History of hiatal hernia   . Arthritis   . Diverticulosis   . Esophageal dysmotility   . RMSF [facility] spotted fever) 2012  . Parkinson's disease [facility])     sees Dr. Wells Guiles Tat   . Complication of anesthesia     Urinary retention. "fights it"- is what he was told. 1990's - vomitted and aspirated in surgery  . History of kidney stones     15 in 2015- last one was in Oct 2015- takes HCTZ to help prevent  . GERD (gastroesophageal reflux disease)     not since Nessen Fundopliation  . Lyme disease 2013    PAST SURGICAL HISTORY:   Past Surgical History  Procedure Laterality Date  . Cholecystectomy    . Tonsillectomy and adenoidectomy    . Esophagogastroduodenoscopy  06-24-13    per Dr. Olevia Perches, no signs of Barretts, repeat in 5 yrs   . Colonoscopy  06-24-13    per Dr. Olevia Perches, no polyps, diveticulosis only, repeat in 10 yrs   . Hernia repair Right     right inguinal  . Nissen fundoplication  0000000    laparoscopic per Dr. Johnathan Hausen  . Orchiopexy      right side at age 70   . Lipomas removed      x10/ on arms and legs  . Lasik      bilateral  . Total shoulder arthroplasty Right 06/23/2015    Procedure: RIGHT TOTAL SHOULDER ARTHROPLASTY;  Surgeon: Justice Britain, MD;  Location: [place];  Service: Orthopedics;  Laterality: Right;    SOCIAL HISTORY:   Social History    Social History  . Marital Status: Married    Spouse Name: N/A  . Number of Children: N/A  . Years of Education: N/A   Occupational History  . Not on file.   Social History Main Topics  . Smoking status: Never Smoker   . Smokeless tobacco: Never Used  . Alcohol Use: No  . Drug Use: No  . Sexual Activity: Not on file   Other Topics Concern  . Not on file   Social History Narrative    FAMILY HISTORY:   Family Status  Relation Status Death Age  . Mother Alive 38    rheumatoid arthritis  . Father Deceased 57    esophageal cancer  . Maternal Grandfather Deceased     esophagel cancer  . Paternal Grandmother Deceased   . Paternal Grandfather Deceased   . Sister Alive   . Brother Alive   . Daughter Alive   . Maternal Grandmother Deceased   . Sister Alive   . Sister Alive   . Sister Alive   . Daughter Alive     ROS:  A complete 10 system review of systems was obtained and was unremarkable apart from what is mentioned above.  PHYSICAL EXAMINATION:    VITALS:   Filed Vitals:   12/05/15 1044  BP: 122/80  Pulse: 84  Height: 5\' 11"  (1.803 m)  Weight: 202 lb (91.627 kg)  Wt Readings from Last 3 Encounters:  12/05/15 202 lb (91.627 kg)  07/28/15 196 lb (88.905 kg)  06/23/15 192 lb (87.091 kg)     GEN:  The patient appears stated age and is in NAD. HEENT:  Normocephalic, atraumatic.  The mucous membranes are moist. The superficial temporal arteries are without ropiness or tenderness. CV:  RRR Lungs:  CTAB Neck/HEME:  There are no carotid bruits bilaterally.  Neurological examination:  Orientation: The patient is alert and oriented x3. Fund of knowledge is appropriate.  Recent and remote memory are intact.  Attention and concentration are normal.    Able to name objects and repeat phrases. Cranial nerves: There is good facial symmetry. Pupils are equal round and reactive to light bilaterally. Fundoscopic exam reveals clear margins bilaterally. Extraocular  muscles are intact. The visual fields are full to confrontational testing. The speech is fluent and clear. Soft palate rises symmetrically and there is no tongue deviation. Hearing is intact to conversational tone. Sensation: Sensation is intact to light touch throughout. Motor: Strength is 5/5 in the bilateral upper and lower extremities.   Shoulder shrug is equal and symmetric.  There is no pronator drift.  No fasciculations.  Movement examination: Tone: There is normal tone in the bilateral UE Abnormal movements: Rare tremor in the RUE Coordination:  There is no decremation with RAMs today with finger taps bilaterally, hand opening and closing bilaterally, heel taps, toe taps bilaterally Gait and Station: The patient has no difficulty arising out of a deep-seated chair without the use of the hands. The patient's stride length is normal with good arm swing bilaterally.  Neg pull test   LABS  Lab Results  Component Value Date   TSH 1.58 05/27/2015     Chemistry      Component Value Date/Time   NA 141 06/23/2015 0553   K 3.4* 06/23/2015 0553   CL 103 06/23/2015 0553   CO2 29 06/23/2015 0553   BUN 9 06/23/2015 0553   CREATININE 0.73 06/23/2015 0553      Component Value Date/Time   CALCIUM 9.3 06/23/2015 0553   ALKPHOS 72 06/23/2015 0553   AST 24 06/23/2015 0553   ALT 28 06/23/2015 0553   BILITOT 2.9* 06/23/2015 0553       ASSESSMENT/PLAN:  1. Parkinsons disease, Hoehn and Yahr stage 1.  -Doing well on mirapex ER - 1.5 mg daily and we will continue that. Risks, benefits, side effects and alternative therapies were discussed.  The opportunity to ask questions was given and they were answered to the best of my ability.  The patient expressed understanding and willingness to follow the outlined treatment protocols.  -continue with faithful exercise.  Talked about emerging therapies and safety with PD.   2.  Right shoulder osteoarthritis  -s/p R shoulder replacement in 06/2015 and  doing well 3.  Insomnia  -too groggy with klonopin and will be adding gabapentin for sciatica.  See if that helps with insomnia.   4.  Sciatica  -told him fine to take the gabapentin 300 mg tid.   5. Follow up is anticipated in the next few months, sooner should new neurologic issues arise.

## 2016-03-26 ENCOUNTER — Other Ambulatory Visit: Payer: Self-pay | Admitting: Neurology

## 2016-04-06 ENCOUNTER — Ambulatory Visit: Admitting: Neurology

## 2016-04-08 NOTE — Progress Notes (Signed)
[name] [name] was seen today in the movement disorders clinic for neurologic consultation at the request of FRY,[name], MD.  The consultation is for the evaluation of tremor.  Tremor started 1 year ago, but it has been getting worse since October.  The pt is R hand dominant.  The pt has noted worsening.  At first, it was positional and intermittent but that has changed.  It is more constant now.  He notices it at rest.  He can make it stop for a short period of time by concentrating on it.    11/18/13 update:  Pt presents today for follow up, accompanied by his wife who supplements the history.  She was not present last visit.  Pt states that he is doing better on the Mirapex.  He feels that he is about 98% better.  He denies SE with the medication.  No compulsive behaviors are noted.  Feeling well.  Exercising.  No falls.  No hallucinations.  Tremor feels like it is just under the surface but not visible.  No lightheadedness.    02/17/14 update:  Pt is f/u today regarding PD.  He is feeling great on mirapex er, 1.5 mg daily.  Feels sleepy if he relaxes but isn't able to sleep if he tries.  Pt states that he rarely feels/sees tremor.  Balance has been good.  Exercising faithfully.  Has had several kidney stones and asks if related to mirapex.  No hallucinations.  No lightheadedness.    06/22/14 update:  Pt is f/u today re: PD.  Doing well on mirapex ER 1.5 mg daily.  Good balance.  No falls.  No hallucinations. No compulsive behaviors.  Exercising a lot.  States that most day he wouldn't even know he had PD.   The records that were made available to me were reviewed.  Been on several abx for prostatitis.  Saw that he called pcp for phenergan suppositories but states that he only uses it rarely as he had a nissen fundoplication.  He has only used one.  Had derm appt and no evidence of CA.  10/25/14 update:  Pt is accompanied by his wife who supplements the history.  He states that it is "time" to  replace the shoulder.  He has it scheduled but not until December 1.  He isn't sure if it isn't under general anesthesia.  He was prescribed phenergan but he almost never uses it (twice in three years).  He will have no tremor for days to weeks and then may have some tremor.  He has some EDS when he stops when he sits and relaxes.  States if he tried to take a nap, though, he won't be able to sleep.    He doesn't snore at night.  He does take temazepam for sleep at night.  He notices balance isn't quite as good as it used to be.  He hasn't fallen.   He exercises faithfully.  He asks about DBS therapy.  03/14/15 update:  Pt is f/u today.  He is doing well from a PD standpoint.  He remains on mirapex er, 1.5 mg daily.  He is having his shoulder replaced in December.  He is going to be under general anesthesia for surgery.  He states that he had no tremor from last visit until last week, but thinks that was mostly thinking about this visit.  It actually went away over the last few days.  He notes that pain in the  shoulder and coldness in the grocery store will bring out the tremor.  He states that he had 2 episodes of anxiety with driving in [place] and in [place] that he was unusual as he knows [place] well (lived there for a long time).  Still trouble with sleeping despite temazepam and has to be "careful" with that because it causes him to sleep eat.   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    07/28/15 update:  The patient is following up today.  He remains on pramipexole ER, 1.5 mg daily.  He is doing well in that regard.  Tremor has been minimal.  No falls.  "My balance is perfect."  No hallucinations.  No lightheadedness or near syncope.  He went off temazepam for insomnia (states that was "brutal" and did that b/c he was binge eating at night) and last visit I told him to add melatonin, 3 mg. He tried that and it didn't help.  He is doing some better but  asks me about what medication he can try instead.   I did review records since our last visit.  He had a shoulder replacement on the right on December 8.  He is restricted to no movement for 5 weeks but states that he has had little pain since the surgery.  Exercise has been a little minimal because of this.  Going to Dr. Nelva Bush for sciatica and plans to have injection on the L.  12/05/15 update:  The patient is following up today.  He remains on pramipexole ER, 1.5 mg daily.  He is doing well in that regard.  No falls.  No hallucinations.  No lightheadedness or near syncope.  We had started clonazepam last visit for insomnia, and he states that it made him groggy and he d/c that.   He had a shoulder replacement in December, and when I saw him last, he still was not able to move that.  The patient reports that he has actually recovered nicely from that surgery.  I did review records since our last visit.  Dr. Nelva Bush sent me know that the patient did have an MRI of his lumbar spine that demonstrated mild degenerative changes.  He was given gabapentin, but according to notes, the patient did not want to take that until he talked to me.  The patient felt that side effects were similar to Parkinson's disease, although Dr. Nelva Bush told him that the biggest side effects or legs swelling and cognitive changes.  Pt states that it is keeping him awake most nights and it is worse when he sits down and it radiates down the L leg.  He is still having insomnia.  04/10/16 update:  Pt f/u today, still doing well on mirapex ER, 1.5 mg daily.  No compulsive behaviors.  No sleep attacks.  No falls, hallucinations, near syncope.  States that he really only has tremor when he comes to see me.  Otherwise, he feels that tremor is well controlled.  Continues to exercise.  Last visit, biggest issues was sciatica.  Started on gabapentin for that but didn't find it helpful.  Tried lyrica but also not helpful.  States that he is seeing  chiropractor for it now.  If he lays or sits, it "was hell."  If he moves, it helps.  Pain radiating down the left leg.  Has had MRI of the lumbar spin with Dr. Nelva Bush and only with degenerative  changes.  Thinks that chiropractics have been some help but just started going.  "my balance is better the last year or two than when I was 65 years old."  Neuroimaging has not previously been performed.   PREVIOUS MEDICATIONS: klonopin for insomnia made groggy  ALLERGIES:   Allergies  Allergen Reactions  . Penicillins Rash    CURRENT MEDICATIONS:  Current Outpatient Prescriptions on File Prior to Visit  Medication Sig Dispense Refill  . atorvastatin (LIPITOR) 20 MG tablet TAKE 1 TABLET DAILY 90 tablet 2  . finasteride (PROSCAR) 5 MG tablet Take 5 mg by mouth daily.    Marland Kitchen gabapentin (NEURONTIN) 300 MG capsule Take 1 capsule by mouth 3 (three) times daily.  0  . hydrochlorothiazide (MICROZIDE) 12.5 MG capsule TAKE 1 CAPSULE DAILY 90 capsule 3  . MIRAPEX ER 1.5 MG TB24 TAKE 1 TABLET DAILY 90 tablet 1  . polyethylene glycol (MIRALAX / GLYCOLAX) packet Take 17 g by mouth daily.    . Probiotic Product (PROBIOTIC DAILY PO) Take 1 capsule by mouth daily.    . tamsulosin (FLOMAX) 0.4 MG CAPS capsule Take 2 capsules (0.8 mg total) by mouth daily. 180 capsule 3   No current facility-administered medications on file prior to visit.     PAST MEDICAL HISTORY:   Past Medical History:  Diagnosis Date  . Arthritis   . Barrett's esophagus    sees Dr. Delfin Edis   . BPH with urinary obstruction   . Complication of anesthesia    Urinary retention. "fights it"- is what he was told. 1990's - vomitted and aspirated in surgery  . Diverticulosis   . Esophageal dysmotility   . GERD (gastroesophageal reflux disease)    not since [facility]  . History of hiatal hernia   . History of kidney stones    15 in 2015- last one was in Oct 2015- takes HCTZ to help prevent  . Hyperlipidemia   . Insomnia   .  Lyme disease 2013  . Parkinson's disease [facility])    sees Dr. Wells Guiles Melora Menon   . RMSF [facility] spotted fever) 2012    PAST SURGICAL HISTORY:   Past Surgical History:  Procedure Laterality Date  . CHOLECYSTECTOMY    . COLONOSCOPY  06-24-13   per Dr. Olevia Perches, no polyps, diveticulosis only, repeat in 10 yrs   . ESOPHAGOGASTRODUODENOSCOPY  06-24-13   per Dr. Olevia Perches, no signs of Barretts, repeat in 5 yrs   . HERNIA REPAIR Right    right inguinal  . LASIK     bilateral  . lipomas removed     x10/ on arms and legs  . NISSEN FUNDOPLICATION  0000000   laparoscopic per Dr. Johnathan Hausen  . ORCHIOPEXY     right side at age 5   . TONSILLECTOMY AND ADENOIDECTOMY    . TOTAL SHOULDER ARTHROPLASTY Right 06/23/2015   Procedure: RIGHT TOTAL SHOULDER ARTHROPLASTY;  Surgeon: Justice Britain, MD;  Location: [facility];  Service: Orthopedics;  Laterality: Right;    SOCIAL HISTORY:   Social History   Social History  . Marital status: Married    Spouse name: N/A  . Number of children: N/A  . Years of education: N/A   Occupational History  . Not on file.   Social History Main Topics  . Smoking status: Never Smoker  . Smokeless tobacco: Never Used  . Alcohol use No  . Drug use: No  . Sexual activity: Not on file   Other Topics Concern  .  Not on file   Social History Narrative  . No narrative on file    FAMILY HISTORY:   Family Status  Relation Status  . Mother Alive   rheumatoid arthritis  . Father Deceased at age 19   esophageal cancer  . Maternal Grandfather Deceased   esophagel cancer  . Paternal Grandmother Deceased  . Paternal Grandfather Deceased  . Sister Alive  . Brother Alive  . Daughter Alive  . Maternal Grandmother Deceased  . Sister Alive  . Sister Alive  . Sister Alive  . Daughter Alive    ROS:  A complete 10 system review of systems was obtained and was unremarkable apart from what is mentioned above.  PHYSICAL EXAMINATION:    VITALS:   There were no  vitals filed for this visit. Wt Readings from Last 3 Encounters:  12/05/15 202 lb (91.6 kg)  07/28/15 196 lb (88.9 kg)  06/23/15 192 lb (87.1 kg)     GEN:  The patient appears stated age and is in NAD. HEENT:  Normocephalic, atraumatic.  The mucous membranes are moist. The superficial temporal arteries are without ropiness or tenderness. CV:  RRR Lungs:  CTAB Neck/HEME:  There are no carotid bruits bilaterally.  Neurological examination:  Orientation: The patient is alert and oriented x3.  Cranial nerves: There is good facial symmetry. Pupils are equal round and reactive to light bilaterally. Fundoscopic exam reveals clear margins bilaterally. Extraocular muscles are intact. The visual fields are full to confrontational testing. The speech is fluent and clear. Soft palate rises symmetrically and there is no tongue deviation. Hearing is intact to conversational tone. Sensation: Sensation is intact to light touch throughout. Motor: Strength is 5/5 in the bilateral upper and lower extremities.   Shoulder shrug is equal and symmetric.  There is no pronator drift.  No fasciculations.  Movement examination: Tone: There is normal tone in the bilateral UE Abnormal movements: Rare tremor in the RUE Coordination:  There is no decremation with RAMs today with finger taps bilaterally, hand opening and closing bilaterally, heel taps, toe taps bilaterally Gait and Station: The patient has no difficulty arising out of a deep-seated chair without the use of the hands. The patient's stride length is normal with slight decrease in arm swing on the right.  LABS  Lab Results  Component Value Date   TSH 1.58 05/27/2015     Chemistry      Component Value Date/Time   NA 141 06/23/2015 0553   K 3.4 (L) 06/23/2015 0553   CL 103 06/23/2015 0553   CO2 29 06/23/2015 0553   BUN 9 06/23/2015 0553   CREATININE 0.73 06/23/2015 0553      Component Value Date/Time   CALCIUM 9.3 06/23/2015 0553   ALKPHOS  72 06/23/2015 0553   AST 24 06/23/2015 0553   ALT 28 06/23/2015 0553   BILITOT 2.9 (H) 06/23/2015 0553       ASSESSMENT/PLAN:  1. Parkinsons disease, Hoehn and Yahr stage 1.  -Doing well on mirapex ER - 1.5 mg daily and we will continue that. Risks, benefits, side effects and alternative therapies were discussed.  The opportunity to ask questions was given and they were answered to the best of my ability.  The patient expressed understanding and willingness to follow the outlined treatment protocols.  -continue with faithful exercise.   2.  Right shoulder osteoarthritis  -s/p R shoulder replacement in 06/2015 and doing well 3.  Insomnia  -too groggy with klonopin and does not  wish to try anything else. 4.  Sciatica  -was seeing Dr. Nelva Bush but now seeing chiropractor.  -offered EMG but didn't want to do that yet.  He will let me know if he changes his mind. 5. Follow up is anticipated in the next few months, sooner should new neurologic issues arise.  Much greater than 50% of this visit was spent in counseling regarding safety.  Total face to face time:  25 min

## 2016-04-10 ENCOUNTER — Ambulatory Visit (INDEPENDENT_AMBULATORY_CARE_PROVIDER_SITE_OTHER): Admitting: Neurology

## 2016-04-10 ENCOUNTER — Encounter: Payer: Self-pay | Admitting: Neurology

## 2016-04-10 VITALS — BP 130/70 | HR 74 | Ht 71.0 in | Wt 201.0 lb

## 2016-04-10 DIAGNOSIS — G20A1 Parkinson's disease without dyskinesia, without mention of fluctuations: Secondary | ICD-10-CM

## 2016-04-10 DIAGNOSIS — G20 Parkinson's disease: Secondary | ICD-10-CM

## 2016-04-10 DIAGNOSIS — M5432 Sciatica, left side: Secondary | ICD-10-CM | POA: Diagnosis not present

## 2016-04-10 DIAGNOSIS — G4700 Insomnia, unspecified: Secondary | ICD-10-CM

## 2016-06-03 ENCOUNTER — Other Ambulatory Visit: Payer: Self-pay | Admitting: Family Medicine

## 2016-07-07 ENCOUNTER — Other Ambulatory Visit: Payer: Self-pay | Admitting: Family Medicine

## 2016-07-23 NOTE — Progress Notes (Deleted)
[name] [name] was seen today in the movement disorders clinic for neurologic consultation at the request of [name] Penna, MD.  The consultation is for the evaluation of tremor.  Tremor started 1 year ago, but it has been getting worse since October.  The pt is R hand dominant.  The pt has noted worsening.  At first, it was positional and intermittent but that has changed.  It is more constant now.  He notices it at rest.  He can make it stop for a short period of time by concentrating on it.    11/18/13 update:  Pt presents today for follow up, accompanied by his wife who supplements the history.  She was not present last visit.  Pt states that he is doing better on the [name] [name].  He feels that he is about 98% better.  He denies SE with the medication.  No compulsive behaviors are noted.  Feeling well.  Exercising.  No falls.  No hallucinations.  Tremor feels like it is just under the surface but not visible.  No lightheadedness.    02/17/14 update:  Pt is f/u today regarding PD.  He is feeling great on [name] [name] er, 1.5 mg daily.  Feels sleepy if he relaxes but isn't able to sleep if he tries.  Pt states that he rarely feels/sees tremor.  Balance has been good.  Exercising faithfully.  Has had several kidney stones and asks if related to [name] [name].  No hallucinations.  No lightheadedness.    06/22/14 update:  Pt is f/u today re: PD.  Doing well on [name] [name] ER 1.5 mg daily.  Good balance.  No falls.  No hallucinations. No compulsive behaviors.  Exercising a lot.  States that most day he wouldn't even know he had PD.   The records that were made available to me were reviewed.  Been on several abx for prostatitis.  Saw that he called pcp for phenergan suppositories but states that he only uses it rarely as he had a nissen fundoplication.  He has only used one.  Had derm appt and no evidence of CA.  10/25/14 update:  Pt is accompanied by his wife who supplements the history.  He states that it is "time" to  replace the shoulder.  He has it scheduled but not until December 1.  He isn't sure if it isn't under general anesthesia.  He was prescribed phenergan but he almost never uses it (twice in three years).  He will have no tremor for days to weeks and then may have some tremor.  He has some EDS when he stops when he sits and relaxes.  States if he tried to take a nap, though, he won't be able to sleep.    He doesn't snore at night.  He does take temazepam for sleep at night.  He notices balance isn't quite as good as it used to be.  He hasn't fallen.   He exercises faithfully.  He asks about DBS therapy.  03/14/15 update:  Pt is f/u today.  He is doing well from a PD standpoint.  He remains on [name] [name] er, 1.5 mg daily.  He is having his shoulder replaced in December.  He is going to be under general anesthesia for surgery.  He states that he had no tremor from last visit until last week, but thinks that was mostly thinking about this visit.  It actually went away over the last few days.  He notes that pain in the  shoulder and coldness in the grocery store will bring out the tremor.  He states that he had 2 episodes of anxiety with driving in [place] and in [place] that he was unusual as he knows [place] well (lived there for a long time).  Still trouble with sleeping despite temazepam and has to be "careful" with that because it causes him to sleep eat.   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    07/28/15 update:  The patient is following up today.  He remains on [name] [name] ER, 1.5 mg daily.  He is doing well in that regard.  Tremor has been minimal.  No falls.  "My balance is perfect."  No hallucinations.  No lightheadedness or near syncope.  He went off temazepam for insomnia (states that was "brutal" and did that b/c he was binge eating at night) and last visit I told him to add melatonin, 3 mg. He tried that and it didn't help.  He is doing some better but  asks me about what medication he can try instead.   I did review records since our last visit.  He had a shoulder replacement on the right on December 8.  He is restricted to no movement for 5 weeks but states that he has had little pain since the surgery.  Exercise has been a little minimal because of this.  Going to Dr. Nelva [name] for sciatica and plans to have injection on the L.  12/05/15 update:  The patient is following up today.  He remains on [name] [name] ER, 1.5 mg daily.  He is doing well in that regard.  No falls.  No hallucinations.  No lightheadedness or near syncope.  We had started clonazepam last visit for insomnia, and he states that it made him groggy and he d/c that.   He had a shoulder replacement in December, and when I saw him last, he still was not able to move that.  The patient reports that he has actually recovered nicely from that surgery.  I did review records since our last visit.  Dr. Nelva [name] sent me know that the patient did have an MRI of his lumbar spine that demonstrated mild degenerative changes.  He was given gabapentin, but according to notes, the patient did not want to take that until he talked to me.  The patient felt that side effects were similar to Parkinson's disease, although Dr. Nelva [name] told him that the biggest side effects or legs swelling and cognitive changes.  Pt states that it is keeping him awake most nights and it is worse when he sits down and it radiates down the L leg.  He is still having insomnia.  04/10/16 update:  Pt f/u today, still doing well on [name] [name] ER, 1.5 mg daily.  No compulsive behaviors.  No sleep attacks.  No falls, hallucinations, near syncope.  States that he really only has tremor when he comes to see me.  Otherwise, he feels that tremor is well controlled.  Continues to exercise.  Last visit, biggest issues was sciatica.  Started on gabapentin for that but didn't find it helpful.  Tried lyrica but also not helpful.  States that he is seeing  chiropractor for it now.  If he lays or sits, it "was hell."  If he moves, it helps.  Pain radiating down the left leg.  Has had MRI of the lumbar spin with Dr. Nelva [name] and only with degenerative  changes.  Thinks that chiropractics have been some help but just started going.  "my balance is better the last year or two than when I was 66 years old."  07/25/16 update:  Pt f/u today, on [name] [name] ER, 1.5 mg daily.  Feels well on the medication.  Pt denies falls.  Pt denies lightheadedness, near syncope.  No hallucinations.  Mood has been good.  No compulsive behaviors or sleep attacks.    Neuroimaging has not previously been performed.   PREVIOUS MEDICATIONS: klonopin for insomnia made groggy  ALLERGIES:   Allergies  Allergen Reactions  . Penicillins Rash    CURRENT MEDICATIONS:  Current Outpatient Prescriptions on File Prior to Visit  Medication Sig Dispense Refill  . atorvastatin (LIPITOR) 20 MG tablet TAKE 1 TABLET DAILY 90 tablet 0  . finasteride (PROSCAR) 5 MG tablet Take 5 mg by mouth daily.    . hydrochlorothiazide (MICROZIDE) 12.5 MG capsule TAKE 1 CAPSULE DAILY 90 capsule 0  . [name] [name] ER 1.5 MG TB24 TAKE 1 TABLET DAILY 90 tablet 1  . polyethylene glycol (MIRALAX / GLYCOLAX) packet Take 17 g by mouth daily.    . Probiotic Product (PROBIOTIC DAILY PO) Take 1 capsule by mouth daily.    . tamsulosin (FLOMAX) 0.4 MG CAPS capsule TAKE 2 CAPSULES (0.8 MG TOTAL) DAILY 180 capsule 0   No current facility-administered medications on file prior to visit.     PAST MEDICAL HISTORY:   Past Medical History:  Diagnosis Date  . Arthritis   . Barrett's esophagus    sees Dr. Delfin Edis   . BPH with urinary obstruction   . Complication of anesthesia    Urinary retention. "fights it"- is what he was told. 1990's - vomitted and aspirated in surgery  . Diverticulosis   . Esophageal dysmotility   . GERD (gastroesophageal reflux disease)    not since [facility] Fund[facility]  . History of hiatal  hernia   . History of kidney stones    15 in 2015- last one was in Oct 2015- takes HCTZ to help prevent  . Hyperlipidemia   . Insomnia   . Lyme disease 2013  . Parkinson's disease [facility])    sees Dr. Wells Guiles Tat   . [facility] spotted fever) 2012    PAST SURGICAL HISTORY:   Past Surgical History:  Procedure Laterality Date  . CHOLECYSTECTOMY    . COLONOSCOPY  06-24-13   per Dr. Olevia Perches, no polyps, diveticulosis only, repeat in 10 yrs   . ESOPHAGOGASTRODUODENOSCOPY  06-24-13   per Dr. Olevia Perches, no signs of Barretts, repeat in 5 yrs   . HERNIA REPAIR Right    right inguinal  . LASIK     bilateral  . lipomas removed     x10/ on arms and legs  . NISSEN FUNDOPLICATION  0000000   laparoscopic per Dr. Johnathan Hausen  . ORCHIOPEXY     right side at age 89   . TONSILLECTOMY AND ADENOIDECTOMY    . TOTAL SHOULDER ARTHROPLASTY Right 06/23/2015   Procedure: RIGHT TOTAL SHOULDER ARTHROPLASTY;  Surgeon: Justice Britain, MD;  Location: [facility];  Service: Orthopedics;  Laterality: Right;    SOCIAL HISTORY:   Social History   Social History  . Marital status: Married    Spouse name: N/A  . Number of children: N/A  . Years of education: N/A   Occupational History  . Not on file.   Social History Main Topics  . Smoking status: Never Smoker  . Smokeless  tobacco: Never Used  . Alcohol use No  . Drug use: No  . Sexual activity: Not on file   Other Topics Concern  . Not on file   Social History Narrative  . No narrative on file    FAMILY HISTORY:   Family Status  Relation Status  . Mother Alive   rheumatoid arthritis  . Father Deceased at age 51   esophageal cancer  . Maternal Grandfather Deceased   esophagel cancer  . Paternal Grandmother Deceased  . Paternal Grandfather Deceased  . Sister Alive  . Brother Alive  . Daughter Alive  . Maternal Grandmother Deceased  . Sister Alive  . Sister Alive  . Sister Alive  . Daughter Alive    ROS:  A complete 10 system  review of systems was obtained and was unremarkable apart from what is mentioned above.  PHYSICAL EXAMINATION:    VITALS:   There were no vitals filed for this visit. Wt Readings from Last 3 Encounters:  04/10/16 201 lb (91.2 kg)  12/05/15 202 lb (91.6 kg)  07/28/15 196 lb (88.9 kg)     GEN:  The patient appears stated age and is in NAD. HEENT:  Normocephalic, atraumatic.  The mucous membranes are moist. The superficial temporal arteries are without ropiness or tenderness. CV:  RRR Lungs:  CTAB Neck/HEME:  There are no carotid bruits bilaterally.  Neurological examination:  Orientation: The patient is alert and oriented x3.  Cranial nerves: There is good facial symmetry. Pupils are equal round and reactive to light bilaterally. Fundoscopic exam reveals clear margins bilaterally. Extraocular muscles are intact. The visual fields are full to confrontational testing. The speech is fluent and clear. Soft palate rises symmetrically and there is no tongue deviation. Hearing is intact to conversational tone. Sensation: Sensation is intact to light touch throughout. Motor: Strength is 5/5 in the bilateral upper and lower extremities.   Shoulder shrug is equal and symmetric.  There is no pronator drift.  No fasciculations.  Movement examination: Tone: There is normal tone in the bilateral UE Abnormal movements: Rare tremor in the RUE Coordination:  There is no decremation with RAMs today with finger taps bilaterally, hand opening and closing bilaterally, heel taps, toe taps bilaterally Gait and Station: The patient has no difficulty arising out of a deep-seated chair without the use of the hands. The patient's stride length is normal with slight decrease in arm swing on the right.  LABS  Lab Results  Component Value Date   TSH 1.58 05/27/2015     Chemistry      Component Value Date/Time   NA 141 06/23/2015 0553   K 3.4 (L) 06/23/2015 0553   CL 103 06/23/2015 0553   CO2 29  06/23/2015 0553   BUN 9 06/23/2015 0553   CREATININE 0.73 06/23/2015 0553      Component Value Date/Time   CALCIUM 9.3 06/23/2015 0553   ALKPHOS 72 06/23/2015 0553   AST 24 06/23/2015 0553   ALT 28 06/23/2015 0553   BILITOT 2.9 (H) 06/23/2015 0553       ASSESSMENT/PLAN:  1. Parkinsons disease, Hoehn and Yahr stage 1.  -Doing well on [name] [name] ER - 1.5 mg daily and we will continue that. Risks, benefits, side effects and alternative therapies were discussed.  The opportunity to ask questions was given and they were answered to the best of my ability.  The patient expressed understanding and willingness to follow the outlined treatment protocols.  -continue with faithful exercise.  2.  Right shoulder osteoarthritis  -s/p R shoulder replacement in 06/2015 and doing well 3.  Insomnia  -too groggy with klonopin and does not wish to try anything else. 4.  Sciatica  -was seeing Dr. Nelva [name] but now seeing chiropractor.  -offered EMG but didn't want to do that yet.  He will let me know if he changes his mind. 5. Follow up is anticipated in the next few months, sooner should new neurologic issues arise.  Much greater than 50% of this visit was spent in counseling regarding safety.  Total face to face time:  25 min

## 2016-07-25 ENCOUNTER — Ambulatory Visit: Admitting: Neurology

## 2016-08-15 ENCOUNTER — Other Ambulatory Visit: Payer: Self-pay | Admitting: Family Medicine

## 2016-08-22 NOTE — Progress Notes (Signed)
[name] [name] was seen today in the movement disorders clinic for neurologic consultation at the request of [name], MD.  The consultation is for the evaluation of tremor.  Tremor started 1 year ago, but it has been getting worse since October.  The pt is R hand dominant.  The pt has noted worsening.  At first, it was positional and intermittent but that has changed.  It is more constant now.  He notices it at rest.  He can make it stop for a short period of time by concentrating on it.    11/18/13 update:  Pt presents today for follow up, accompanied by his wife who supplements the history.  She was not present last visit.  Pt states that he is doing better on the Mirapex.  He feels that he is about 98% better.  He denies SE with the medication.  No compulsive behaviors are noted.  Feeling well.  Exercising.  No falls.  No hallucinations.  Tremor feels like it is just under the surface but not visible.  No lightheadedness.    02/17/14 update:  Pt is f/u today regarding PD.  He is feeling great on mirapex er, 1.5 mg daily.  Feels sleepy if he relaxes but isn't able to sleep if he tries.  Pt states that he rarely feels/sees tremor.  Balance has been good.  Exercising faithfully.  Has had several kidney stones and asks if related to mirapex.  No hallucinations.  No lightheadedness.    06/22/14 update:  Pt is f/u today re: PD.  Doing well on mirapex ER 1.5 mg daily.  Good balance.  No falls.  No hallucinations. No compulsive behaviors.  Exercising a lot.  States that most day he wouldn't even know he had PD.   The records that were made available to me were reviewed.  Been on several abx for prostatitis.  Saw that he called pcp for phenergan suppositories but states that he only uses it rarely as he had a nissen fundoplication.  He has only used one.  Had derm appt and no evidence of CA.  10/25/14 update:  Pt is accompanied by his wife who supplements the history.  He states that it is "time" to  replace the shoulder.  He has it scheduled but not until December 1.  He isn't sure if it isn't under general anesthesia.  He was prescribed phenergan but he almost never uses it (twice in three years).  He will have no tremor for days to weeks and then may have some tremor.  He has some EDS when he stops when he sits and relaxes.  States if he tried to take a nap, though, he won't be able to sleep.    He doesn't snore at night.  He does take temazepam for sleep at night.  He notices balance isn't quite as good as it used to be.  He hasn't fallen.   He exercises faithfully.  He asks about DBS therapy.  03/14/15 update:  Pt is f/u today.  He is doing well from a PD standpoint.  He remains on mirapex er, 1.5 mg daily.  He is having his shoulder replaced in December.  He is going to be under general anesthesia for surgery.  He states that he had no tremor from last visit until last week, but thinks that was mostly thinking about this visit.  It actually went away over the last few days.  He notes that pain in the  shoulder and coldness in the grocery store will bring out the tremor.  He states that he had 2 episodes of anxiety with driving in [place] and in [place] that he was unusual as he knows [place] well (lived there for a long time).  Still trouble with sleeping despite temazepam and has to be "careful" with that because it causes him to sleep eat.   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    07/28/15 update:  The patient is following up today.  He remains on pramipexole ER, 1.5 mg daily.  He is doing well in that regard.  Tremor has been minimal.  No falls.  "My balance is perfect."  No hallucinations.  No lightheadedness or near syncope.  He went off temazepam for insomnia (states that was "brutal" and did that b/c he was binge eating at night) and last visit I told him to add melatonin, 3 mg. He tried that and it didn't help.  He is doing some better but  asks me about what medication he can try instead.   I did review records since our last visit.  He had a shoulder replacement on the right on December 8.  He is restricted to no movement for 5 weeks but states that he has had little pain since the surgery.  Exercise has been a little minimal because of this.  Going to Dr. Nelva Bush for sciatica and plans to have injection on the L.  12/05/15 update:  The patient is following up today.  He remains on pramipexole ER, 1.5 mg daily.  He is doing well in that regard.  No falls.  No hallucinations.  No lightheadedness or near syncope.  We had started clonazepam last visit for insomnia, and he states that it made him groggy and he d/c that.   He had a shoulder replacement in December, and when I saw him last, he still was not able to move that.  The patient reports that he has actually recovered nicely from that surgery.  I did review records since our last visit.  Dr. Nelva Bush sent me know that the patient did have an MRI of his lumbar spine that demonstrated mild degenerative changes.  He was given gabapentin, but according to notes, the patient did not want to take that until he talked to me.  The patient felt that side effects were similar to Parkinson's disease, although Dr. Nelva Bush told him that the biggest side effects or legs swelling and cognitive changes.  Pt states that it is keeping him awake most nights and it is worse when he sits down and it radiates down the L leg.  He is still having insomnia.  04/10/16 update:  Pt f/u today, still doing well on mirapex ER, 1.5 mg daily.  No compulsive behaviors.  No sleep attacks.  No falls, hallucinations, near syncope.  States that he really only has tremor when he comes to see me.  Otherwise, he feels that tremor is well controlled.  Continues to exercise.  Last visit, biggest issues was sciatica.  Started on gabapentin for that but didn't find it helpful.  Tried lyrica but also not helpful.  States that he is seeing  chiropractor for it now.  If he lays or sits, it "was hell."  If he moves, it helps.  Pain radiating down the left leg.  Has had MRI of the lumbar spin with Dr. Nelva Bush and only with degenerative  changes.  Thinks that chiropractics have been some help but just started going.  "my balance is better the last year or two than when I was 65 years old."  08/24/16 update:  Pt f/u today, on pramipexole ER, 1.5 mg daily.  Feels well on the medication. Still struggling with sciatica.  Doing Yoga.  Swimming for exercise now.   Pt denies falls.  Pt denies lightheadedness, near syncope.  No hallucinations.  Mood has been good.  No compulsive behaviors or sleep attacks.  Only notes tremor with cold or when sciatica is bad.    Neuroimaging has not previously been performed.   PREVIOUS MEDICATIONS: klonopin for insomnia made groggy  ALLERGIES:   Allergies  Allergen Reactions  . Penicillins Rash    CURRENT MEDICATIONS:  Current Outpatient Prescriptions on File Prior to Visit  Medication Sig Dispense Refill  . atorvastatin (LIPITOR) 20 MG tablet TAKE 1 TABLET DAILY 90 tablet 0  . finasteride (PROSCAR) 5 MG tablet Take 5 mg by mouth daily.    . hydrochlorothiazide (MICROZIDE) 12.5 MG capsule TAKE 1 CAPSULE DAILY 90 capsule 0  . MIRAPEX ER 1.5 MG TB24 TAKE 1 TABLET DAILY 90 tablet 1  . polyethylene glycol (MIRALAX / GLYCOLAX) packet Take 17 g by mouth daily.    . Probiotic Product (PROBIOTIC DAILY PO) Take 1 capsule by mouth daily.    . tamsulosin (FLOMAX) 0.4 MG CAPS capsule TAKE 2 CAPSULES (0.8 MG TOTAL) DAILY 180 capsule 0   No current facility-administered medications on file prior to visit.     PAST MEDICAL HISTORY:   Past Medical History:  Diagnosis Date  . Arthritis   . Barrett's esophagus    sees Dr. Delfin Edis   . BPH with urinary obstruction   . Complication of anesthesia    Urinary retention. "fights it"- is what he was told. 1990's - vomitted and aspirated in surgery  . Diverticulosis   .  Esophageal dysmotility   . GERD (gastroesophageal reflux disease)    not since Nessen Fundopliation  . History of hiatal hernia   . History of kidney stones    15 in 2015- last one was in Oct 2015- takes HCTZ to help prevent  . Hyperlipidemia   . Insomnia   . Lyme disease 2013  . Parkinson's disease [facility])    sees Dr. Wells Guiles Tat   . RMSF [facility] spotted fever) 2012    PAST SURGICAL HISTORY:   Past Surgical History:  Procedure Laterality Date  . CHOLECYSTECTOMY    . COLONOSCOPY  06-24-13   per Dr. Olevia Perches, no polyps, diveticulosis only, repeat in 10 yrs   . ESOPHAGOGASTRODUODENOSCOPY  06-24-13   per Dr. Olevia Perches, no signs of Barretts, repeat in 5 yrs   . HERNIA REPAIR Right    right inguinal  . LASIK     bilateral  . lipomas removed     x10/ on arms and legs  . NISSEN FUNDOPLICATION  0000000   laparoscopic per Dr. Johnathan Hausen  . ORCHIOPEXY     right side at age 28   . TONSILLECTOMY AND ADENOIDECTOMY    . TOTAL SHOULDER ARTHROPLASTY Right 06/23/2015   Procedure: RIGHT TOTAL SHOULDER ARTHROPLASTY;  Surgeon: Justice Britain, MD;  Location: [place];  Service: Orthopedics;  Laterality: Right;    SOCIAL HISTORY:   Social History   Social History  . Marital status: Married    Spouse name: N/A  . Number of children: N/A  . Years of education: N/A  Occupational History  . Not on file.   Social History Main Topics  . Smoking status: Never Smoker  . Smokeless tobacco: Never Used  . Alcohol use No  . Drug use: No  . Sexual activity: Not on file   Other Topics Concern  . Not on file   Social History Narrative  . No narrative on file    FAMILY HISTORY:   Family Status  Relation Status  . Mother Alive   rheumatoid arthritis  . Father Deceased at age 85   esophageal cancer  . Maternal Grandfather Deceased   esophagel cancer  . Paternal Grandmother Deceased  . Paternal Grandfather Deceased  . Sister Alive  . Brother Alive  . Daughter Alive  . Maternal  Grandmother Deceased  . Sister Alive  . Sister Alive  . Sister Alive  . Daughter Alive    ROS:  A complete 10 system review of systems was obtained and was unremarkable apart from what is mentioned above.  PHYSICAL EXAMINATION:    VITALS:   Vitals:   08/24/16 1353  BP: 126/64  Pulse: 72  Weight: 204 lb (92.5 kg)  Height: 5\' 11"  (1.803 m)   Wt Readings from Last 3 Encounters:  08/24/16 204 lb (92.5 kg)  04/10/16 201 lb (91.2 kg)  12/05/15 202 lb (91.6 kg)     GEN:  The patient appears stated age and is in NAD. HEENT:  Normocephalic, atraumatic.  The mucous membranes are moist. The superficial temporal arteries are without ropiness or tenderness. CV:  RRR Lungs:  CTAB Neck/HEME:  There are no carotid bruits bilaterally.  Neurological examination:  Orientation: The patient is alert and oriented x3.  Cranial nerves: There is good facial symmetry. Pupils are equal round and reactive to light bilaterally. Fundoscopic exam reveals clear margins bilaterally. Extraocular muscles are intact. The visual fields are full to confrontational testing. The speech is fluent and clear. Soft palate rises symmetrically and there is no tongue deviation. Hearing is intact to conversational tone. Sensation: Sensation is intact to light touch throughout. Motor: Strength is 5/5 in the bilateral upper and lower extremities.   Shoulder shrug is equal and symmetric.  There is no pronator drift.  No fasciculations.  Movement examination: Tone: There is normal tone in the bilateral UE Abnormal movements: Rare tremor in the RUE.  Can feel it and see it when distracted Coordination:  There is minor decrease in finger taps on the right.  Rare trouble with hand opening and closing on the left.  He has some trouble with toe taps on the L (but attributes to sciatica).   Gait and Station: The patient has no difficulty arising out of a deep-seated chair without the use of the hands. The patient's stride length  is normal with slight decrease in arm swing on the right.  LABS  Lab Results  Component Value Date   TSH 1.03 08/23/2016     Chemistry      Component Value Date/Time   NA 141 08/23/2016 0944   K 3.8 08/23/2016 0944   CL 100 08/23/2016 0944   CO2 33 (H) 08/23/2016 0944   BUN 13 08/23/2016 0944   CREATININE 0.72 08/23/2016 0944      Component Value Date/Time   CALCIUM 9.4 08/23/2016 0944   ALKPHOS 74 08/23/2016 0944   AST 21 08/23/2016 0944   ALT 22 08/23/2016 0944   BILITOT 3.1 (H) 08/23/2016 0944       ASSESSMENT/PLAN:  1. Parkinsons disease, Hoehn  and Yahr stage 1.  -Doing well on mirapex ER - 1.5 mg daily and we will continue that. Risks, benefits, side effects and alternative therapies were discussed.  The opportunity to ask questions was given and they were answered to the best of my ability.  The patient expressed understanding and willingness to follow the outlined treatment protocols.  -continue with faithful exercise.    2.  Abnormal PSA  -PSA much higher yesterday.  States that he had hx of similar in past when had kidney stone and thought that had kidney stone recently.  Wouldn't have normally shared this with him but he specifically asked me what his PSA was.  He has appt with PCP next week and he does have a urologist.  3.  Insomnia  -too groggy with klonopin.  States tried melatonin without relief (unknown dose).  Somewhat of a short phase sleeper  4.  Sciatica  -was seeing Dr. Nelva Bush but now seeing chiropractor.  -offered EMG but didn't want to do that yet.  He will let me know if he changes his mind.  5. Follow up is anticipated in the next few months, sooner should new neurologic issues arise.  Much greater than 50% of this visit was spent in counseling regarding safety.  Total face to face time:  25 min

## 2016-08-23 ENCOUNTER — Other Ambulatory Visit (INDEPENDENT_AMBULATORY_CARE_PROVIDER_SITE_OTHER)

## 2016-08-23 DIAGNOSIS — Z0000 Encounter for general adult medical examination without abnormal findings: Secondary | ICD-10-CM

## 2016-08-23 LAB — LIPID PANEL
CHOL/HDL RATIO: 3
Cholesterol: 140 mg/dL (ref 0–200)
HDL: 42.6 mg/dL (ref >39.00)
LDL Cholesterol: 61 mg/dL (ref 0–99)
NONHDL: 97.16
Triglycerides: 183 mg/dL — ABNORMAL HIGH (ref 0.0–149.0)
VLDL: 36.6 mg/dL (ref 0.0–40.0)

## 2016-08-23 LAB — CBC WITH DIFFERENTIAL/PLATELET
BASOS ABS: 0 10*3/uL (ref 0.0–0.1)
Basophils Relative: 0.7 % (ref 0.0–3.0)
EOS ABS: 0.1 10*3/uL (ref 0.0–0.7)
EOS PCT: 1.5 % (ref 0.0–5.0)
HCT: 44.5 % (ref 39.0–52.0)
HEMOGLOBIN: 15.7 g/dL (ref 13.0–17.0)
LYMPHS ABS: 1.1 10*3/uL (ref 0.7–4.0)
Lymphocytes Relative: 17.7 % (ref 12.0–46.0)
MCHC: 35.3 g/dL (ref 30.0–36.0)
MCV: 89.7 fl (ref 78.0–100.0)
MONO ABS: 0.5 10*3/uL (ref 0.1–1.0)
Monocytes Relative: 7 % (ref 3.0–12.0)
NEUTROS PCT: 73.1 % (ref 43.0–77.0)
Neutro Abs: 4.7 10*3/uL (ref 1.4–7.7)
Platelets: 207 10*3/uL (ref 150.0–400.0)
RBC: 4.96 Mil/uL (ref 4.22–5.81)
RDW: 13.5 % (ref 11.5–15.5)
WBC: 6.5 10*3/uL (ref 4.0–10.5)

## 2016-08-23 LAB — HEPATIC FUNCTION PANEL
ALT: 22 U/L (ref 0–53)
AST: 21 U/L (ref 0–37)
Albumin: 4.3 g/dL (ref 3.5–5.2)
Alkaline Phosphatase: 74 U/L (ref 39–117)
BILIRUBIN DIRECT: 0.3 mg/dL (ref 0.0–0.3)
BILIRUBIN TOTAL: 3.1 mg/dL — AB (ref 0.2–1.2)
Total Protein: 6.6 g/dL (ref 6.0–8.3)

## 2016-08-23 LAB — POC URINALSYSI DIPSTICK (AUTOMATED)
Bilirubin, UA: NEGATIVE
GLUCOSE UA: NEGATIVE
KETONES UA: NEGATIVE
Nitrite, UA: NEGATIVE
Protein, UA: NEGATIVE
RBC UA: NEGATIVE
Spec Grav, UA: 1.025
UROBILINOGEN UA: 0.2
pH, UA: 5.5

## 2016-08-23 LAB — BASIC METABOLIC PANEL
BUN: 13 mg/dL (ref 6–23)
CALCIUM: 9.4 mg/dL (ref 8.4–10.5)
CO2: 33 mEq/L — ABNORMAL HIGH (ref 19–32)
CREATININE: 0.72 mg/dL (ref 0.40–1.50)
Chloride: 100 mEq/L (ref 96–112)
GFR: 116.52 mL/min (ref >60.00)
Glucose, Bld: 110 mg/dL — ABNORMAL HIGH (ref 70–99)
POTASSIUM: 3.8 meq/L (ref 3.5–5.1)
Sodium: 141 mEq/L (ref 135–145)

## 2016-08-23 LAB — TSH: TSH: 1.03 u[IU]/mL (ref 0.35–4.50)

## 2016-08-23 LAB — PSA: PSA: 4.7 ng/mL — ABNORMAL HIGH (ref 0.10–4.00)

## 2016-08-24 ENCOUNTER — Encounter: Payer: Self-pay | Admitting: Neurology

## 2016-08-24 ENCOUNTER — Ambulatory Visit (INDEPENDENT_AMBULATORY_CARE_PROVIDER_SITE_OTHER): Admitting: Neurology

## 2016-08-24 VITALS — BP 126/64 | HR 72 | Ht 71.0 in | Wt 204.0 lb

## 2016-08-24 DIAGNOSIS — G20 Parkinson's disease: Secondary | ICD-10-CM

## 2016-08-24 DIAGNOSIS — R972 Elevated prostate specific antigen [PSA]: Secondary | ICD-10-CM | POA: Diagnosis not present

## 2016-08-24 MED ORDER — PRAMIPEXOLE DIHYDROCHLORIDE ER 1.5 MG PO TB24: 1.0000 | ORAL_TABLET | Freq: Every day | ORAL | 1 refills | Status: DC

## 2016-08-30 ENCOUNTER — Encounter: Payer: Self-pay | Admitting: Family Medicine

## 2016-08-30 ENCOUNTER — Ambulatory Visit (INDEPENDENT_AMBULATORY_CARE_PROVIDER_SITE_OTHER): Admitting: Family Medicine

## 2016-08-30 VITALS — BP 147/87 | HR 88 | Temp 97.9°F | Ht 71.0 in | Wt 198.0 lb

## 2016-08-30 DIAGNOSIS — Z0000 Encounter for general adult medical examination without abnormal findings: Secondary | ICD-10-CM | POA: Diagnosis not present

## 2016-08-30 DIAGNOSIS — R972 Elevated prostate specific antigen [PSA]: Secondary | ICD-10-CM | POA: Diagnosis not present

## 2016-08-30 MED ORDER — HYDROCHLOROTHIAZIDE 12.5 MG PO CAPS: 12.5000 mg | ORAL_CAPSULE | Freq: Every day | ORAL | 3 refills | Status: DC

## 2016-08-30 MED ORDER — TAMSULOSIN HCL 0.4 MG PO CAPS: 0.4000 mg | ORAL_CAPSULE | Freq: Every day | ORAL | 3 refills | Status: DC

## 2016-08-30 MED ORDER — TRAMADOL HCL 50 MG PO TABS: 100.0000 mg | ORAL_TABLET | Freq: Every day | ORAL | 1 refills | Status: DC

## 2016-08-30 MED ORDER — ATORVASTATIN CALCIUM 20 MG PO TABS: 20.0000 mg | ORAL_TABLET | Freq: Every day | ORAL | 3 refills | Status: DC

## 2016-08-30 NOTE — Progress Notes (Signed)
   Subjective:    Patient ID: [name] [name], male    DOB: Jul 24, 1950, 66 y.o.   MRN: [id number]  HPI 66 yr old male for a wellness exam. He has a few ongoing problems. He has suffered with left sided sciatic for a year and he has been working with Dr. Nelva Bush on this. He has had several epidural steroid injections and he has been to PT. He saw a chiropractor a few times with mixed results. He has tried yoga as well, but nothing has been very successful. He is swimming a 1/2 mile a day. He recently tried Tramadol for sleep and this has actually helped a lot. He takes 2 Tramadol tablets at bedtime and this controls the sciatica pain so he can sleep. During the day he takes nothing for pain. His recent labs showed a jump in his PSA to 4.70 but [name] notes that he has passed sveral kidney stones in the past month. He thinks this may have affected the PSA because it has done so in the past. He is scheduled to see Dr. Alinda Money in a few months. His Parkinsons is well controlled.    Review of Systems  Constitutional: Negative.   HENT: Negative.   Eyes: Negative.   Respiratory: Negative.   Cardiovascular: Negative.   Gastrointestinal: Negative.   Genitourinary: Negative.   Musculoskeletal: Positive for back pain. Negative for arthralgias, gait problem, joint swelling, myalgias, neck pain and neck stiffness.  Skin: Negative.   Neurological: Negative.   Psychiatric/Behavioral: Negative.        Objective:   Physical Exam  Constitutional: He is oriented to person, place, and time. He appears well-developed and well-nourished. No distress.  HENT:  Head: Normocephalic and atraumatic.  Right Ear: External ear normal.  Left Ear: External ear normal.  Nose: Nose normal.  Mouth/Throat: Oropharynx is clear and moist. No oropharyngeal exudate.  Eyes: Conjunctivae and EOM are normal. Pupils are equal, round, and reactive to light. Right eye exhibits no discharge. Left eye exhibits no discharge. No scleral  icterus.  Neck: Neck supple. No JVD present. No tracheal deviation present. No thyromegaly present.  Cardiovascular: Normal rate, regular rhythm, normal heart sounds and intact distal pulses.  Exam reveals no gallop and no friction rub.   No murmur heard. Pulmonary/Chest: Effort normal and breath sounds normal. No respiratory distress. He has no wheezes. He has no rales. He exhibits no tenderness.  Abdominal: Soft. Bowel sounds are normal. He exhibits no distension and no mass. There is no tenderness. There is no rebound and no guarding.  Musculoskeletal: Normal range of motion. He exhibits no edema or tenderness.  Lymphadenopathy:    He has no cervical adenopathy.  Neurological: He is alert and oriented to person, place, and time. He has normal reflexes. No cranial nerve deficit. He exhibits normal muscle tone. Coordination normal.  Skin: Skin is warm and dry. No rash noted. He is not diaphoretic. No erythema. No pallor.  Psychiatric: He has a normal mood and affect. His behavior is normal. Judgment and thought content normal.          Assessment & Plan:  Well exam. We agreed to recheck a PSA in early March to see if it settles down now that he has stopped passing stones. He will follow up with Dr. Carles Collet for the Parkinsons. Use Tramadol at night for the sciatica.  [name], MD

## 2016-08-30 NOTE — Progress Notes (Signed)
Pre visit review using our clinic review tool, if applicable. No additional management support is needed unless otherwise documented below in the visit note. 

## 2016-09-18 ENCOUNTER — Other Ambulatory Visit (INDEPENDENT_AMBULATORY_CARE_PROVIDER_SITE_OTHER)

## 2016-09-18 DIAGNOSIS — R972 Elevated prostate specific antigen [PSA]: Secondary | ICD-10-CM | POA: Diagnosis not present

## 2016-09-18 LAB — PSA: PSA: 0.8 ng/mL (ref 0.10–4.00)

## 2016-10-09 ENCOUNTER — Telehealth: Payer: Self-pay | Admitting: Gastroenterology

## 2016-10-09 NOTE — Telephone Encounter (Signed)
Dr. Jacobs will you accept?  

## 2016-10-09 NOTE — Telephone Encounter (Signed)
Patient is a former Dr.Brodie patient but states that Dr.Fry wants him to see Dr.Jacobs now. Patient states that he is suppose to have endo done every 3 years due to family hx of esophogeal cancer. Do Dr.Jacobs accept patient for ov to discuss endo?

## 2016-10-09 NOTE — Telephone Encounter (Signed)
Happy to see him.  Please schedule my next available [place] office appt (not with extender, do not double book).  thanks

## 2016-11-05 ENCOUNTER — Encounter: Payer: Self-pay | Admitting: Gastroenterology

## 2016-11-05 ENCOUNTER — Ambulatory Visit (INDEPENDENT_AMBULATORY_CARE_PROVIDER_SITE_OTHER): Payer: Medicare Other | Admitting: Gastroenterology

## 2016-11-05 VITALS — BP 120/80 | HR 95 | Ht 71.0 in | Wt 203.0 lb

## 2016-11-05 DIAGNOSIS — Z800 Family history of malignant neoplasm of digestive organs: Secondary | ICD-10-CM

## 2016-11-05 DIAGNOSIS — K2270 Barrett's esophagus without dysplasia: Secondary | ICD-10-CM

## 2016-11-05 NOTE — Progress Notes (Signed)
HPI: This is a   whvery pleasant 66 year old mano was a previous patient of Dr. Sydell Axon Brody's who retired one or 2 years ago.  Chief complaint is  and history of esophagus cancer, personal history of Barrett's esophagus  Old Data Reviewed:   EGD 2011, Dr. Elicia Lamp. Biopsies showed intestinal metaplasia consistent with Barrett's esophagus. No dysplasia.  EGD 2013, Dr. Elicia Lamp. Biopsies showed intestinal metaplasia consistent with Barrett's esophagus no dysplasia  EGD December 2014, Dr. Elicia Lamp. Biopsies showed no intestinal metaplasia.  All 3 of those upper endoscopies only showed irregular Z line. Nonnodular.  Father, sister both died of esophageal cancer.  Cousin died from it.  Possible paternal GM with esophagus cancer.  He had nissen fundoplication: 8299.  Had terrible GERD prior to that but no need for routine anti acid meds since then.  Occasionally dysphagia with large bites since Nissen.  Eats slow small bites since then  CAnnot belch since Nissen.   Overall stable weight.  Exercise daily a lot.    Review of systems: Pertinent positive and negative review of systems were noted in the above HPI section. Complete review of systems was performed and was otherwise normal.   Past Medical History:  Diagnosis Date  . Arthritis   . Barrett's esophagus    sees Dr. Delfin Edis   . BPH with urinary obstruction   . Complication of anesthesia    Urinary retention. "fights it"- is what he was told. 1990's - vomitted and aspirated in surgery  . Diverticulosis   . Esophageal dysmotility   . GERD (gastroesophageal reflux disease)    not since Nessen Fundopliation  . History of hiatal hernia   . History of kidney stones    15 in 2015- last one was in Oct 2015- takes HCTZ to help prevent  . Hyperlipidemia   . Insomnia   . Lyme disease 2013  . Parkinson's disease [facility])    sees Dr. Wells Guiles Tat   . RMSF [facility] spotted fever) 2012    Past Surgical History:   Procedure Laterality Date  . CHOLECYSTECTOMY    . COLONOSCOPY  06-24-13   per Dr. Olevia Perches, no polyps, diveticulosis only, repeat in 10 yrs   . ESOPHAGOGASTRODUODENOSCOPY  06-24-13   per Dr. Olevia Perches, no signs of Barretts, repeat in 5 yrs   . HERNIA REPAIR Right    right inguinal  . LASIK     bilateral  . lipomas removed     x10/ on arms and legs  . NISSEN FUNDOPLICATION  [id number]   laparoscopic per Dr. Johnathan Hausen  . ORCHIOPEXY     right side at age 17   . TONSILLECTOMY AND ADENOIDECTOMY    . TOTAL SHOULDER ARTHROPLASTY Right 06/23/2015   Procedure: RIGHT TOTAL SHOULDER ARTHROPLASTY;  Surgeon: Justice Britain, MD;  Location: [place];  Service: Orthopedics;  Laterality: Right;    Current Outpatient Prescriptions  Medication Sig Dispense Refill  . atorvastatin (LIPITOR) 20 MG tablet Take 1 tablet (20 mg total) by mouth daily. 90 tablet 3  . finasteride (PROSCAR) 5 MG tablet Take 5 mg by mouth daily.    . hydrochlorothiazide (MICROZIDE) 12.5 MG capsule Take 1 capsule (12.5 mg total) by mouth daily. 90 capsule 3  . polyethylene glycol (MIRALAX / GLYCOLAX) packet Take 17 g by mouth daily.    . Pramipexole Dihydrochloride (MIRAPEX ER) 1.5 MG TB24 Take 1 tablet (1.5 mg total) by mouth daily. 90 tablet 1  . Probiotic Product (PROBIOTIC DAILY  PO) Take 1 capsule by mouth daily.    . tamsulosin (FLOMAX) 0.4 MG CAPS capsule Take 1 capsule (0.4 mg total) by mouth daily. 90 capsule 3  . traMADol (ULTRAM) 50 MG tablet Take 2 tablets (100 mg total) by mouth at bedtime. 180 tablet 1   No current facility-administered medications for this visit.     Allergies as of 11/05/2016 - Review Complete 11/05/2016  Allergen Reaction Noted  . Penicillins Rash     Family History  Problem Relation Age of Onset  . Arthritis Mother   . Esophageal cancer Father   . Lung cancer Maternal Grandfather   . Esophageal cancer Paternal Grandmother   . Lung cancer Paternal Grandfather   . Colon cancer Sister      questionable  . Esophageal cancer Paternal Aunt     x 2  . Esophageal cancer Cousin   . Rectal cancer Neg Hx   . Stomach cancer Neg Hx     Social History   Social History  . Marital status: Married    Spouse name: N/A  . Number of children: N/A  . Years of education: N/A   Occupational History  . Not on file.   Social History Main Topics  . Smoking status: Never Smoker  . Smokeless tobacco: Never Used  . Alcohol use No  . Drug use: No  . Sexual activity: Not on file   Other Topics Concern  . Not on file   Social History Narrative  . No narrative on file     Physical Exam: BP 120/80   Pulse 95   Ht 5\' 11"  (1.803 m)   Wt 203 lb (92.1 kg)   BMI 28.31 kg/m  Constitutional: generally well-appearing Psychiatric: alert and oriented x3 Eyes: extraocular movements intact Mouth: oral pharynx moist, no lesions Neck: supple no lymphadenopathy Cardiovascular: heart regular rate and rhythm Lungs: clear to auscultation bilaterally Abdomen: soft, nontender, nondistended, no obvious ascites, no peritoneal signs, normal bowel sounds Extremities: no lower extremity edema bilaterally Skin: no lesions on visible extremities   Assessment and plan: 66 y.o. male withstrong family history of esophagus cancer, personal history of Barrett's change esophagus  first he knows for certain that his father had esophagus cancer and he is also fairly certain that his sister had esophagus cancer. Some other family members may also have had esophagus cancer but he is not positive about that. He has had an irregular appearing Z line without nodular changes for many years. Previous biopsies suggested intestinal metaplasia. The most recent EGD biopsies 3-4 years ago showed no sign of Barrett's change however. I do think it is reasonable to repeat EGD at this time. We did discuss recent changes to national guidelines for screening for Barrett's esophagus. Under current guidelines his irregular Z line  would not generally be enough to warrant Barrett's biopsies. I think with take this in light of the fact that probably 2 first-degree family members of his died of esophagus cancer.   Please see the "Patient Instructions" section for addition details about the plan.   Owens Loffler, MD [facility] 11/05/2016, 3:06 PM  Cc: [name], MD

## 2016-11-05 NOTE — Patient Instructions (Addendum)
You have been scheduled for an endoscopy. Please follow written instructions given to you at your visit today. If you use inhalers (even only as needed), please bring them with you on the day of your procedure. Your physician has requested that you go to www.startemmi.com and enter the access code given to you at your visit today. This web site gives a general overview about your procedure. However, you should still follow specific instructions given to you by our office regarding your preparation for the procedure.  If you are age 48 or older, your body mass index should be between 23-30. Your Body mass index is 28.31 kg/m. If this is out of the aforementioned range listed, please consider follow up with your Primary Care Provider.  If you are age 11 or younger, your body mass index should be between 19-25. Your Body mass index is 28.31 kg/m. If this is out of the aformentioned range listed, please consider follow up with your Primary Care Provider.

## 2016-11-20 NOTE — Progress Notes (Signed)
[name] [name] was seen today in the movement disorders clinic for neurologic consultation at the request of [name], MD.  The consultation is for the evaluation of tremor.  Tremor started 1 year ago, but it has been getting worse since October.  The pt is R hand dominant.  The pt has noted worsening.  At first, it was positional and intermittent but that has changed.  It is more constant now.  He notices it at rest.  He can make it stop for a short period of time by concentrating on it.    11/18/13 update:  Pt presents today for follow up, accompanied by his wife who supplements the history.  She was not present last visit.  Pt states that he is doing better on the Mirapex.  He feels that he is about 98% better.  He denies SE with the medication.  No compulsive behaviors are noted.  Feeling well.  Exercising.  No falls.  No hallucinations.  Tremor feels like it is just under the surface but not visible.  No lightheadedness.    02/17/14 update:  Pt is f/u today regarding PD.  He is feeling great on mirapex er, 1.5 mg daily.  Feels sleepy if he relaxes but isn't able to sleep if he tries.  Pt states that he rarely feels/sees tremor.  Balance has been good.  Exercising faithfully.  Has had several kidney stones and asks if related to mirapex.  No hallucinations.  No lightheadedness.    06/22/14 update:  Pt is f/u today re: PD.  Doing well on mirapex ER 1.5 mg daily.  Good balance.  No falls.  No hallucinations. No compulsive behaviors.  Exercising a lot.  States that most day he wouldn't even know he had PD.   The records that were made available to me were reviewed.  Been on several abx for prostatitis.  Saw that he called pcp for phenergan suppositories but states that he only uses it rarely as he had a nissen fundoplication.  He has only used one.  Had derm appt and no evidence of CA.  10/25/14 update:  Pt is accompanied by his wife who supplements the history.  He states that it is "time" to  replace the shoulder.  He has it scheduled but not until December 1.  He isn't sure if it isn't under general anesthesia.  He was prescribed phenergan but he almost never uses it (twice in three years).  He will have no tremor for days to weeks and then may have some tremor.  He has some EDS when he stops when he sits and relaxes.  States if he tried to take a nap, though, he won't be able to sleep.    He doesn't snore at night.  He does take temazepam for sleep at night.  He notices balance isn't quite as good as it used to be.  He hasn't fallen.   He exercises faithfully.  He asks about DBS therapy.  03/14/15 update:  Pt is f/u today.  He is doing well from a PD standpoint.  He remains on mirapex er, 1.5 mg daily.  He is having his shoulder replaced in December.  He is going to be under general anesthesia for surgery.  He states that he had no tremor from last visit until last week, but thinks that was mostly thinking about this visit.  It actually went away over the last few days.  He notes that pain in  the shoulder and coldness in the grocery store will bring out the tremor.  He states that he had 2 episodes of anxiety with driving in [place] and in [place] that he was unusual as he knows [place] well (lived there for a long time).  Still trouble with sleeping despite temazepam and has to be "careful" with that because it causes him to sleep eat.   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    07/28/15 update:  The patient is following up today.  He remains on pramipexole ER, 1.5 mg daily.  He is doing well in that regard.  Tremor has been minimal.  No falls.  "My balance is perfect."  No hallucinations.  No lightheadedness or near syncope.  He went off temazepam for insomnia (states that was "brutal" and did that b/c he was binge eating at night) and last visit I told him to add melatonin, 3 mg. He tried that and it didn't help.  He is doing some better but  asks me about what medication he can try instead.   I did review records since our last visit.  He had a shoulder replacement on the right on December 8.  He is restricted to no movement for 5 weeks but states that he has had little pain since the surgery.  Exercise has been a little minimal because of this.  Going to Dr. Nelva Bush for sciatica and plans to have injection on the L.  12/05/15 update:  The patient is following up today.  He remains on pramipexole ER, 1.5 mg daily.  He is doing well in that regard.  No falls.  No hallucinations.  No lightheadedness or near syncope.  We had started clonazepam last visit for insomnia, and he states that it made him groggy and he d/c that.   He had a shoulder replacement in December, and when I saw him last, he still was not able to move that.  The patient reports that he has actually recovered nicely from that surgery.  I did review records since our last visit.  Dr. Nelva Bush sent me know that the patient did have an MRI of his lumbar spine that demonstrated mild degenerative changes.  He was given gabapentin, but according to notes, the patient did not want to take that until he talked to me.  The patient felt that side effects were similar to Parkinson's disease, although Dr. Nelva Bush told him that the biggest side effects or legs swelling and cognitive changes.  Pt states that it is keeping him awake most nights and it is worse when he sits down and it radiates down the L leg.  He is still having insomnia.  04/10/16 update:  Pt f/u today, still doing well on mirapex ER, 1.5 mg daily.  No compulsive behaviors.  No sleep attacks.  No falls, hallucinations, near syncope.  States that he really only has tremor when he comes to see me.  Otherwise, he feels that tremor is well controlled.  Continues to exercise.  Last visit, biggest issues was sciatica.  Started on gabapentin for that but didn't find it helpful.  Tried lyrica but also not helpful.  States that he is seeing  chiropractor for it now.  If he lays or sits, it "was hell."  If he moves, it helps.  Pain radiating down the left leg.  Has had MRI of the lumbar spin with Dr. Nelva Bush and only with  degenerative changes.  Thinks that chiropractics have been some help but just started going.  "my balance is better the last year or two than when I was 66 years old."  08/24/16 update:  Pt f/u today, on pramipexole ER, 1.5 mg daily.  Feels well on the medication. Still struggling with sciatica.  Doing Yoga.  Swimming for exercise now.   Pt denies falls.  Pt denies lightheadedness, near syncope.  No hallucinations.  Mood has been good.  No compulsive behaviors or sleep attacks.  Only notes tremor with cold or when sciatica is bad.    11/22/16 update:  Patient seen today in follow-up.  He remains on pramipexole ER, 1.5 mg daily.  Denies sleep attacks.  Denies compulsive behaviors.  Continues to swim for exercise.  He is very active in his yard.  He denies any lightheadedness or near syncope.  He denies hallucinations.  He denies delusions.  His mood has been good.  He denies falls.  He is still having sciatic pain and is back to see Dr. Nelva Bush on 5/30.  He is considering surgical intervention.    Neuroimaging has not previously been performed.   PREVIOUS MEDICATIONS: klonopin for insomnia made groggy  ALLERGIES:   Allergies  Allergen Reactions  . Penicillins Rash    CURRENT MEDICATIONS:  Current Outpatient Prescriptions on File Prior to Visit  Medication Sig Dispense Refill  . atorvastatin (LIPITOR) 20 MG tablet Take 1 tablet (20 mg total) by mouth daily. 90 tablet 3  . finasteride (PROSCAR) 5 MG tablet Take 5 mg by mouth daily.    . hydrochlorothiazide (MICROZIDE) 12.5 MG capsule Take 1 capsule (12.5 mg total) by mouth daily. 90 capsule 3  . polyethylene glycol (MIRALAX / GLYCOLAX) packet Take 17 g by mouth daily.    . Pramipexole Dihydrochloride (MIRAPEX ER) 1.5 MG TB24 Take 1 tablet (1.5 mg total) by mouth daily. 90  tablet 1  . Probiotic Product (PROBIOTIC DAILY PO) Take 1 capsule by mouth daily.    . tamsulosin (FLOMAX) 0.4 MG CAPS capsule Take 1 capsule (0.4 mg total) by mouth daily. 90 capsule 3  . traMADol (ULTRAM) 50 MG tablet Take 2 tablets (100 mg total) by mouth at bedtime. 180 tablet 1   No current facility-administered medications on file prior to visit.     PAST MEDICAL HISTORY:   Past Medical History:  Diagnosis Date  . Arthritis   . Barrett's esophagus    sees Dr. Delfin Edis   . BPH with urinary obstruction   . Complication of anesthesia    Urinary retention. "fights it"- is what he was told. 1990's - vomitted and aspirated in surgery  . Diverticulosis   . Esophageal dysmotility   . GERD (gastroesophageal reflux disease)    not since [facility] Fundopliation  . History of hiatal hernia   . History of kidney stones    15 in 2015- last one was in Oct 2015- takes HCTZ to help prevent  . Hyperlipidemia   . Insomnia   . Lyme disease 2013  . Parkinson's disease [facility])    sees Dr. Wells Guiles Caydin Yeatts   . RMSF [facility] spotted fever) 2012    PAST SURGICAL HISTORY:   Past Surgical History:  Procedure Laterality Date  . CHOLECYSTECTOMY    . COLONOSCOPY  06-24-13   per Dr. Olevia Perches, no polyps, diveticulosis only, repeat in 10 yrs   . ESOPHAGOGASTRODUODENOSCOPY  06-24-13   per Dr. Olevia Perches, no signs of Barretts, repeat in 5 yrs   .  HERNIA REPAIR Right    right inguinal  . LASIK     bilateral  . lipomas removed     x10/ on arms and legs  . NISSEN FUNDOPLICATION  01-29-95   laparoscopic per Dr. Johnathan Hausen  . ORCHIOPEXY     right side at age 26   . TONSILLECTOMY AND ADENOIDECTOMY    . TOTAL SHOULDER ARTHROPLASTY Right 06/23/2015   Procedure: RIGHT TOTAL SHOULDER ARTHROPLASTY;  Surgeon: Justice Britain, MD;  Location: [facility];  Service: Orthopedics;  Laterality: Right;    SOCIAL HISTORY:   Social History   Social History  . Marital status: Married    Spouse name: N/A  . Number of  children: N/A  . Years of education: N/A   Occupational History  . Not on file.   Social History Main Topics  . Smoking status: Never Smoker  . Smokeless tobacco: Never Used  . Alcohol use No  . Drug use: No  . Sexual activity: Not on file   Other Topics Concern  . Not on file   Social History Narrative  . No narrative on file    FAMILY HISTORY:   Family Status  Relation Status  . Mother Alive   rheumatoid arthritis  . Father Deceased at age 54   esophageal cancer  . Maternal Grandfather Deceased   esophagel cancer  . Paternal Grandmother Deceased  . Paternal Grandfather Deceased  . Sister Alive  . Brother Alive  . Daughter Alive  . Maternal Grandmother Deceased  . Sister Alive  . Sister Alive  . Sister Alive  . Daughter Alive  . Paternal Aunt   . Cousin   . Neg Hx     ROS:  A complete 10 system review of systems was obtained and was unremarkable apart from what is mentioned above.  PHYSICAL EXAMINATION:    VITALS:   There were no vitals filed for this visit. Wt Readings from Last 3 Encounters:  11/05/16 203 lb (92.1 kg)  08/30/16 198 lb (89.8 kg)  08/24/16 204 lb (92.5 kg)     GEN:  The patient appears stated age and is in NAD. HEENT:  Normocephalic, atraumatic.  The mucous membranes are moist. The superficial temporal arteries are without ropiness or tenderness. CV:  RRR Lungs:  CTAB Neck/HEME:  There are no carotid bruits bilaterally.  Neurological examination:  Orientation: The patient is alert and oriented x3.  Cranial nerves: There is good facial symmetry. Pupils are equal round and reactive to light bilaterally. Fundoscopic exam reveals clear margins bilaterally. Extraocular muscles are intact. The visual fields are full to confrontational testing. The speech is fluent and clear. Soft palate rises symmetrically and there is no tongue deviation. Hearing is intact to conversational tone. Sensation: Sensation is intact to light touch  throughout. Motor: Strength is 5/5 in the bilateral upper and lower extremities.   Shoulder shrug is equal and symmetric.  There is no pronator drift.  No fasciculations.  Movement examination: Tone: There is normal tone in the bilateral UE Abnormal movements: no tremor noted today Coordination:  There is minor decrease in finger taps on the right.  Rare trouble with hand opening and closing on the left.  He has some trouble with toe taps on the L (but attributes to sciatica).   Gait and Station: The patient has no difficulty arising out of a deep-seated chair without the use of the hands. The patient's stride length is normal with slight decrease in arm swing on the  right.  LABS  Lab Results  Component Value Date   TSH 1.03 08/23/2016     Chemistry      Component Value Date/Time   NA 141 08/23/2016 0944   K 3.8 08/23/2016 0944   CL 100 08/23/2016 0944   CO2 33 (H) 08/23/2016 0944   BUN 13 08/23/2016 0944   CREATININE 0.72 08/23/2016 0944      Component Value Date/Time   CALCIUM 9.4 08/23/2016 0944   ALKPHOS 74 08/23/2016 0944   AST 21 08/23/2016 0944   ALT 22 08/23/2016 0944   BILITOT 3.1 (H) 08/23/2016 0944     Lab Results  Component Value Date   PSA 0.80 09/18/2016   PSA 4.70 (H) 08/23/2016   PSA 0.68 05/27/2015     ASSESSMENT/PLAN:  1. Parkinsons disease  -Doing well on mirapex ER - 1.5 mg daily and we will continue that. Risks, benefits, side effects and alternative therapies were discussed.  The opportunity to ask questions was given and they were answered to the best of my ability.  The patient expressed understanding and willingness to follow the outlined treatment protocols.  -continue with faithful exercise.    2.  Abnormal PSA  -PSA much higher last visit but reports it was due to kidney stone and once he passed it the PSA went down  3.  Insomnia  -too groggy with klonopin.  States tried melatonin without relief (unknown dose).  Somewhat of a short phase  sleeper  4.  Sciatica  -to see Dr. Nelva Bush on 12/12/16.  Pt now contemplating sx  5. Follow up is anticipated in the next few months, sooner should new neurologic issues arise.  Much greater than 50% of this visit was spent in counseling regarding safety.  Total face to face time:  25 min

## 2016-11-22 ENCOUNTER — Ambulatory Visit (INDEPENDENT_AMBULATORY_CARE_PROVIDER_SITE_OTHER): Payer: Medicare Other | Admitting: Neurology

## 2016-11-22 ENCOUNTER — Encounter: Payer: Self-pay | Admitting: Neurology

## 2016-11-22 VITALS — BP 112/68 | HR 83 | Ht 71.0 in | Wt 204.0 lb

## 2016-11-22 DIAGNOSIS — G20A1 Parkinson's disease without dyskinesia, without mention of fluctuations: Secondary | ICD-10-CM

## 2016-11-22 DIAGNOSIS — G20 Parkinson's disease: Secondary | ICD-10-CM

## 2016-12-12 DIAGNOSIS — M5416 Radiculopathy, lumbar region: Secondary | ICD-10-CM | POA: Diagnosis not present

## 2016-12-12 DIAGNOSIS — M5136 Other intervertebral disc degeneration, lumbar region: Secondary | ICD-10-CM | POA: Diagnosis not present

## 2016-12-26 ENCOUNTER — Encounter: Payer: Self-pay | Admitting: Gastroenterology

## 2016-12-26 ENCOUNTER — Ambulatory Visit (AMBULATORY_SURGERY_CENTER): Payer: Medicare Other | Admitting: Gastroenterology

## 2016-12-26 VITALS — BP 107/72 | HR 72 | Temp 97.8°F | Resp 19 | Ht 71.0 in | Wt 197.0 lb

## 2016-12-26 DIAGNOSIS — K2270 Barrett's esophagus without dysplasia: Secondary | ICD-10-CM | POA: Diagnosis not present

## 2016-12-26 DIAGNOSIS — K219 Gastro-esophageal reflux disease without esophagitis: Secondary | ICD-10-CM | POA: Diagnosis not present

## 2016-12-26 DIAGNOSIS — Z800 Family history of malignant neoplasm of digestive organs: Secondary | ICD-10-CM

## 2016-12-26 DIAGNOSIS — K229 Disease of esophagus, unspecified: Secondary | ICD-10-CM | POA: Diagnosis not present

## 2016-12-26 DIAGNOSIS — G20 Parkinson's disease: Secondary | ICD-10-CM | POA: Diagnosis not present

## 2016-12-26 MED ORDER — SODIUM CHLORIDE 0.9 % IV SOLN: 500.0000 mL | INTRAVENOUS | Status: DC

## 2016-12-26 NOTE — Op Note (Signed)
[facility] Patient Name: [name] [name] Procedure Date: 12/26/2016 9:37 AM MRN: [id number] Endoscopist: Milus Banister , MD Age: 66 Referring MD:  Date of Birth: 1951/07/11 Gender: Male Account #: [id number] Procedure:                Upper GI endoscopy Indications:              Surveillance for malignancy due to personal history                            of Barrett's esophagus, Family history of                            esophageal cancer Medicines:                Monitored Anesthesia Care Procedure:                Pre-Anesthesia Assessment:                           - Prior to the procedure, a History and Physical                            was performed, and patient medications and                            allergies were reviewed. The patient's tolerance of                            previous anesthesia was also reviewed. The risks                            and benefits of the procedure and the sedation                            options and risks were discussed with the patient.                            All questions were answered, and informed consent                            was obtained. Prior Anticoagulants: The patient has                            taken no previous anticoagulant or antiplatelet                            agents. ASA Grade Assessment: II - A patient with                            mild systemic disease. After reviewing the risks                            and benefits, the patient was deemed in  satisfactory condition to undergo the procedure.                           After obtaining informed consent, the endoscope was                            passed under direct vision. Throughout the                            procedure, the patient's blood pressure, pulse, and                            oxygen saturations were monitored continuously. The                            Endoscope was introduced through the  mouth, and                            advanced to the second part of duodenum. The upper                            GI endoscopy was accomplished without difficulty.                            The patient tolerated the procedure well. Scope In: Scope Out: Findings:                 The Z-line was irregular, non-Nodular. There was no                            overt Barrett's appearing mucosa. There was mild                            erosive focal esophagitis at GE junction. Biopsies                            were taken with a cold forceps for histology.                           Previous Nissen fundoplication appears intact with                            a very small hiatal hernia present..                           The examined duodenum was normal. Complications:            No immediate complications. Estimated blood loss:                            None. Estimated Blood Loss:     Estimated blood loss: none. Impression:               - Non-nodular, irregular Z-line. Likely mild focal  erosive esophagitis. Multiple biopsies taken.                           - Intact fundoplication with small hiatal herni.                           - Normal examined duodenum. Recommendation:           - Patient has a contact number available for                            emergencies. The signs and symptoms of potential                            delayed complications were discussed with the                            patient. Return to normal activities tomorrow.                            Written discharge instructions were provided to the                            patient.                           - Resume previous diet.                           - Continue present medications.                           - Given the evidence of mild reflux related damage                            to the esophagus, please start once daily antiacid                            medicine  (omeprazole 20mg , OTC). It is best to take                            this pill 20-30 min prior to breakfast meal.                           - Await pathology results. Milus Banister, MD 12/26/2016 9:56:24 AM This report has been signed electronically.

## 2016-12-26 NOTE — Patient Instructions (Signed)
Start Omeprazole OTC 20 mg by mouth daily prior to breakfast.  Handout given :Esophagitis.  YOU HAD AN ENDOSCOPIC PROCEDURE TODAY AT [facility]:   Refer to the procedure report that was given to you for any specific questions about what was found during the examination.  If the procedure report does not answer your questions, please call your gastroenterologist to clarify.  If you requested that your care partner not be given the details of your procedure findings, then the procedure report has been included in a sealed envelope for you to review at your convenience later.  YOU SHOULD EXPECT: Some feelings of bloating in the abdomen. Passage of more gas than usual.  Walking can help get rid of the air that was put into your GI tract during the procedure and reduce the bloating. If you had a lower endoscopy (such as a colonoscopy or flexible sigmoidoscopy) you may notice spotting of blood in your stool or on the toilet paper. If you underwent a bowel prep for your procedure, you may not have a normal bowel movement for a few days.  Please Note:  You might notice some irritation and congestion in your nose or some drainage.  This is from the oxygen used during your procedure.  There is no need for concern and it should clear up in a day or so.  SYMPTOMS TO REPORT IMMEDIATELY:    Following upper endoscopy (EGD)  Vomiting of blood or coffee ground material  New chest pain or pain under the shoulder blades  Painful or persistently difficult swallowing  New shortness of breath  Fever of 100F or higher  Black, tarry-looking stools  For urgent or emergent issues, a gastroenterologist can be reached at any hour by calling [phone].   DIET:  We do recommend a small meal at first, but then you may proceed to your regular diet.  Drink plenty of fluids but you should avoid alcoholic beverages for 24 hours.  ACTIVITY:  You should plan to take it easy for the rest of today and  you should NOT DRIVE or use heavy machinery until tomorrow (because of the sedation medicines used during the test).    FOLLOW UP: Our staff will call the number listed on your records the next business day following your procedure to check on you and address any questions or concerns that you may have regarding the information given to you following your procedure. If we do not reach you, we will leave a message.  However, if you are feeling well and you are not experiencing any problems, there is no need to return our call.  We will assume that you have returned to your regular daily activities without incident.  If any biopsies were taken you will be contacted by phone or by letter within the next 1-3 weeks.  Please call us at [phone] if you have not heard about the biopsies in 3 weeks.    SIGNATURES/CONFIDENTIALITY: You and/or your care partner have signed paperwork which will be entered into your electronic medical record.  These signatures attest to the fact that that the information above on your After Visit Summary has been reviewed and is understood.  Full responsibility of the confidentiality of this discharge information lies with you and/or your care-partner.

## 2016-12-26 NOTE — Progress Notes (Signed)
Called to room to assist during endoscopic procedure.  Patient ID and intended procedure confirmed with present staff. Received instructions for my participation in the procedure from the performing physician.  

## 2016-12-26 NOTE — Progress Notes (Signed)
To recovery, report to Tyrell, RN, VSS. 

## 2016-12-27 ENCOUNTER — Telehealth: Payer: Self-pay | Admitting: *Deleted

## 2016-12-27 NOTE — Telephone Encounter (Signed)
  Follow up Call-  Call back number 12/26/2016  Post procedure Call Back phone  # [phone]  Permission to leave phone message Yes  Some recent data might be hidden     Patient questions:  Do you have a fever, pain , or abdominal swelling? No. Pain Score  0 *  Have you tolerated food without any problems? Yes.    Have you been able to return to your normal activities? Yes.    Do you have any questions about your discharge instructions: Diet   No. Medications  No. Follow up visit  No.  Do you have questions or concerns about your Care? No.  Actions: * If pain score is 4 or above: No action needed, pain <4.

## 2017-01-02 ENCOUNTER — Encounter: Payer: Self-pay | Admitting: Family Medicine

## 2017-01-02 ENCOUNTER — Encounter: Payer: Self-pay | Admitting: Gastroenterology

## 2017-01-02 ENCOUNTER — Ambulatory Visit (INDEPENDENT_AMBULATORY_CARE_PROVIDER_SITE_OTHER): Payer: Medicare Other | Admitting: Family Medicine

## 2017-01-02 VITALS — BP 137/84 | HR 96 | Temp 98.6°F | Ht 71.0 in | Wt 205.0 lb

## 2017-01-02 DIAGNOSIS — Z23 Encounter for immunization: Secondary | ICD-10-CM

## 2017-01-02 DIAGNOSIS — G20 Parkinson's disease: Secondary | ICD-10-CM

## 2017-01-02 DIAGNOSIS — G20A1 Parkinson's disease without dyskinesia, without mention of fluctuations: Secondary | ICD-10-CM

## 2017-01-02 DIAGNOSIS — M791 Myalgia, unspecified site: Secondary | ICD-10-CM

## 2017-01-02 DIAGNOSIS — W57XXXS Bitten or stung by nonvenomous insect and other nonvenomous arthropods, sequela: Secondary | ICD-10-CM

## 2017-01-02 DIAGNOSIS — M5136 Other intervertebral disc degeneration, lumbar region: Secondary | ICD-10-CM | POA: Diagnosis not present

## 2017-01-02 NOTE — Progress Notes (Signed)
   Subjective:    Patient ID: [name] [name], male    DOB: Nov 17, 1950, 66 y.o.   MRN: [id number]  HPI Here for some immunizations and also asking to test for Lyme disease. He gets numerous tick bites every summer because of the time he spends outdoors and he has never had traditional rashes or headaches or fevers. However he has read that Lyme can mimic Parkinsons symptoms.    Review of Systems  Constitutional: Negative.   Respiratory: Negative.   Cardiovascular: Negative.   Musculoskeletal: Positive for gait problem and myalgias.  Skin: Negative for rash.  Neurological: Positive for tremors.       Objective:   Physical Exam  Constitutional: He is oriented to person, place, and time. He appears well-developed and well-nourished.  Cardiovascular: Normal rate, regular rhythm, normal heart sounds and intact distal pulses.   Pulmonary/Chest: Effort normal and breath sounds normal.  Neurological: He is alert and oriented to person, place, and time. He exhibits abnormal muscle tone. Coordination normal.  Resting tremor of the right hand           Assessment & Plan:  Given immunizations as above. Check for Lyme antibodies.  [name], MD

## 2017-01-02 NOTE — Patient Instructions (Signed)
WE NOW OFFER   [name] FAST TRACK!!!  SAME DAY Appointments for ACUTE CARE  Such as: Sprains, Injuries, cuts, abrasions, rashes, muscle pain, joint pain, back pain Colds, flu, sore throats, headache, allergies, cough, fever  Ear pain, sinus and eye infections Abdominal pain, nausea, vomiting, diarrhea, upset stomach Animal/insect bites  3 Easy Ways to Schedule: Walk-In Scheduling Call in scheduling Mychart Sign-up: [email or web address]         

## 2017-01-05 LAB — B. BURGDORFI ANTIBODIES

## 2017-02-15 DIAGNOSIS — N401 Enlarged prostate with lower urinary tract symptoms: Secondary | ICD-10-CM | POA: Diagnosis not present

## 2017-02-15 DIAGNOSIS — Z87442 Personal history of urinary calculi: Secondary | ICD-10-CM | POA: Diagnosis not present

## 2017-02-15 DIAGNOSIS — R351 Nocturia: Secondary | ICD-10-CM | POA: Diagnosis not present

## 2017-02-28 ENCOUNTER — Telehealth: Payer: Self-pay | Admitting: Family Medicine

## 2017-02-28 NOTE — Telephone Encounter (Signed)
Pt request refill  traMADol (ULTRAM) 50 MG tablet  Pt states he never filled the first Rx, took to the pharmacy, and it has expired. Pt is in a lot of pain now and was hoping to start the tramadol  Pt would like 10 days sent to [facility], [place] another regular rx for tramadol sent to   [facility], [place]

## 2017-03-01 MED ORDER — TRAMADOL HCL 50 MG PO TABS: 100.0000 mg | ORAL_TABLET | Freq: Every day | ORAL | 1 refills | Status: DC

## 2017-03-01 NOTE — Telephone Encounter (Signed)
I faxed script to mail order and called in a 30 day supply to local [facility].

## 2017-03-01 NOTE — Telephone Encounter (Signed)
Written rx is ready to fax. Also call in #[phone] with no rf to local pharmacy

## 2017-03-19 DIAGNOSIS — Z23 Encounter for immunization: Secondary | ICD-10-CM | POA: Diagnosis not present

## 2017-04-03 NOTE — Progress Notes (Signed)
[name] [name] was seen today in the movement disorders clinic for neurologic consultation at the request of [name], MD.  The consultation is for the evaluation of tremor.  Tremor started 1 year ago, but it has been getting worse since October.  The pt is R hand dominant.  The pt has noted worsening.  At first, it was positional and intermittent but that has changed.  It is more constant now.  He notices it at rest.  He can make it stop for a short period of time by concentrating on it.    11/18/13 update:  Pt presents today for follow up, accompanied by his wife who supplements the history.  She was not present last visit.  Pt states that he is doing better on the Mirapex.  He feels that he is about 98% better.  He denies SE with the medication.  No compulsive behaviors are noted.  Feeling well.  Exercising.  No falls.  No hallucinations.  Tremor feels like it is just under the surface but not visible.  No lightheadedness.    02/17/14 update:  Pt is f/u today regarding PD.  He is feeling great on mirapex er, 1.5 mg daily.  Feels sleepy if he relaxes but isn't able to sleep if he tries.  Pt states that he rarely feels/sees tremor.  Balance has been good.  Exercising faithfully.  Has had several kidney stones and asks if related to mirapex.  No hallucinations.  No lightheadedness.    06/22/14 update:  Pt is f/u today re: PD.  Doing well on mirapex ER 1.5 mg daily.  Good balance.  No falls.  No hallucinations. No compulsive behaviors.  Exercising a lot.  States that most day he wouldn't even know he had PD.   The records that were made available to me were reviewed.  Been on several abx for prostatitis.  Saw that he called pcp for phenergan suppositories but states that he only uses it rarely as he had a nissen fundoplication.  He has only used one.  Had derm appt and no evidence of CA.  10/25/14 update:  Pt is accompanied by his wife who supplements the history.  He states that it is "time" to  replace the shoulder.  He has it scheduled but not until December 1.  He isn't sure if it isn't under general anesthesia.  He was prescribed phenergan but he almost never uses it (twice in three years).  He will have no tremor for days to weeks and then may have some tremor.  He has some EDS when he stops when he sits and relaxes.  States if he tried to take a nap, though, he won't be able to sleep.    He doesn't snore at night.  He does take temazepam for sleep at night.  He notices balance isn't quite as good as it used to be.  He hasn't fallen.   He exercises faithfully.  He asks about DBS therapy.  03/14/15 update:  Pt is f/u today.  He is doing well from a PD standpoint.  He remains on mirapex er, 1.5 mg daily.  He is having his shoulder replaced in December.  He is going to be under general anesthesia for surgery.  He states that he had no tremor from last visit until last week, but thinks that was mostly thinking about this visit.  It actually went away over the last few days.  He notes that pain in  the shoulder and coldness in the grocery store will bring out the tremor.  He states that he had 2 episodes of anxiety with driving in [place] and in [place] that he was unusual as he knows [place] well (lived there for a long time).  Still trouble with sleeping despite temazepam and has to be "careful" with that because it causes him to sleep eat.   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    07/28/15 update:  The patient is following up today.  He remains on pramipexole ER, 1.5 mg daily.  He is doing well in that regard.  Tremor has been minimal.  No falls.  "My balance is perfect."  No hallucinations.  No lightheadedness or near syncope.  He went off temazepam for insomnia (states that was "brutal" and did that b/c he was binge eating at night) and last visit I told him to add melatonin, 3 mg. He tried that and it didn't help.  He is doing some better but  asks me about what medication he can try instead.   I did review records since our last visit.  He had a shoulder replacement on the right on December 8.  He is restricted to no movement for 5 weeks but states that he has had little pain since the surgery.  Exercise has been a little minimal because of this.  Going to Dr. Nelva Bush for sciatica and plans to have injection on the L.  12/05/15 update:  The patient is following up today.  He remains on pramipexole ER, 1.5 mg daily.  He is doing well in that regard.  No falls.  No hallucinations.  No lightheadedness or near syncope.  We had started clonazepam last visit for insomnia, and he states that it made him groggy and he d/c that.   He had a shoulder replacement in December, and when I saw him last, he still was not able to move that.  The patient reports that he has actually recovered nicely from that surgery.  I did review records since our last visit.  Dr. Nelva Bush sent me know that the patient did have an MRI of his lumbar spine that demonstrated mild degenerative changes.  He was given gabapentin, but according to notes, the patient did not want to take that until he talked to me.  The patient felt that side effects were similar to Parkinson's disease, although Dr. Nelva Bush told him that the biggest side effects or legs swelling and cognitive changes.  Pt states that it is keeping him awake most nights and it is worse when he sits down and it radiates down the L leg.  He is still having insomnia.  04/10/16 update:  Pt f/u today, still doing well on mirapex ER, 1.5 mg daily.  No compulsive behaviors.  No sleep attacks.  No falls, hallucinations, near syncope.  States that he really only has tremor when he comes to see me.  Otherwise, he feels that tremor is well controlled.  Continues to exercise.  Last visit, biggest issues was sciatica.  Started on gabapentin for that but didn't find it helpful.  Tried lyrica but also not helpful.  States that he is seeing  chiropractor for it now.  If he lays or sits, it "was hell."  If he moves, it helps.  Pain radiating down the left leg.  Has had MRI of the lumbar spin with Dr. Nelva Bush and only with  degenerative changes.  Thinks that chiropractics have been some help but just started going.  "my balance is better the last year or two than when I was 66 years old."  08/24/16 update:  Pt f/u today, on pramipexole ER, 1.5 mg daily.  Feels well on the medication. Still struggling with sciatica.  Doing Yoga.  Swimming for exercise now.   Pt denies falls.  Pt denies lightheadedness, near syncope.  No hallucinations.  Mood has been good.  No compulsive behaviors or sleep attacks.  Only notes tremor with cold or when sciatica is bad.    11/22/16 update:  Patient seen today in follow-up.  He remains on pramipexole ER, 1.5 mg daily.  Denies sleep attacks.  Denies compulsive behaviors.  Continues to swim for exercise.  He is very active in his yard.  He denies any lightheadedness or near syncope.  He denies hallucinations.  He denies delusions.  His mood has been good.  He denies falls.  He is still having sciatic pain and is back to see Dr. Nelva Bush on 5/30.  He is considering surgical intervention.    04/04/17 update:  Pt f/u today for PD.  On pramipexole ER, 1.5 mg daily.  No compulsive behaviors.  No sleep attacks.  Continues to exercise daily.  The records that were made available to me were reviewed since last visit.  Last saw [name], MD on 01/02/17.  Sciatica was doing great but having issues again.  He gave up swimming and states that "I move rocks instead."  He has been doing lots of work in his yard.  Wearing off:  No.  How long before next dose:  n/a Falls:   No. N/V:  No. Hallucinations:  No.  visual distortions: No. Lightheaded:  No.  Syncope: No. Dyskinesia:  No.   PREVIOUS MEDICATIONS: klonopin for insomnia made groggy  ALLERGIES:   Allergies  Allergen Reactions  . Penicillins Rash    CURRENT  MEDICATIONS:  Current Outpatient Prescriptions on File Prior to Visit  Medication Sig Dispense Refill  . atorvastatin (LIPITOR) 20 MG tablet Take 1 tablet (20 mg total) by mouth daily. 90 tablet 3  . finasteride (PROSCAR) 5 MG tablet Take 5 mg by mouth daily.    . hydrochlorothiazide (MICROZIDE) 12.5 MG capsule Take 1 capsule (12.5 mg total) by mouth daily. 90 capsule 3  . polyethylene glycol (MIRALAX / GLYCOLAX) packet Take 17 g by mouth daily.    . Pramipexole Dihydrochloride (MIRAPEX ER) 1.5 MG TB24 Take 1 tablet (1.5 mg total) by mouth daily. 90 tablet 1  . Probiotic Product (PROBIOTIC DAILY PO) Take 1 capsule by mouth daily.    . tamsulosin (FLOMAX) 0.4 MG CAPS capsule Take 1 capsule (0.4 mg total) by mouth daily. 90 capsule 3  . traMADol (ULTRAM) 50 MG tablet Take 2 tablets (100 mg total) by mouth at bedtime. 180 tablet 1   Current Facility-Administered Medications on File Prior to Visit  Medication Dose Route Frequency Provider Last Rate Last Dose  . 0.9 %  sodium chloride infusion  500 mL Intravenous Continuous Milus Banister, MD        PAST MEDICAL HISTORY:   Past Medical History:  Diagnosis Date  . Arthritis   . Barrett's esophagus    sees Dr. Delfin Edis   . BPH with urinary obstruction   . Complication of anesthesia    Urinary retention. "fights it"- is what he was told. 1990's - vomitted and aspirated in surgery  .  Diverticulosis   . Esophageal dysmotility   . GERD (gastroesophageal reflux disease)    not since [facility] Fundopliation  . History of hiatal hernia   . History of kidney stones    15 in 2015- last one was in Oct 2015- takes HCTZ to help prevent  . Hyperlipidemia   . Insomnia   . Lyme disease 2013  . Parkinson's disease [facility])    sees Dr. Wells Guiles Fredia [place]   . RMSF [facility] spotted fever) 2012    PAST SURGICAL HISTORY:   Past Surgical History:  Procedure Laterality Date  . CHOLECYSTECTOMY    . COLONOSCOPY  06-24-13   per Dr. Olevia Perches, no polyps,  diveticulosis only, repeat in 10 yrs   . ESOPHAGOGASTRODUODENOSCOPY  06-24-13   per Dr. Olevia Perches, no signs of Barretts, repeat in 5 yrs   . HERNIA REPAIR Right    right inguinal  . LASIK     bilateral  . lipomas removed     x10/ on arms and legs  . NISSEN FUNDOPLICATION  1-61-09   laparoscopic per Dr. Johnathan Hausen  . ORCHIOPEXY     right side at age 83   . TONSILLECTOMY AND ADENOIDECTOMY    . TOTAL SHOULDER ARTHROPLASTY Right 06/23/2015   Procedure: RIGHT TOTAL SHOULDER ARTHROPLASTY;  Surgeon: Justice Britain, MD;  Location: [facility];  Service: Orthopedics;  Laterality: Right;    SOCIAL HISTORY:   Social History   Social History  . Marital status: Married    Spouse name: N/A  . Number of children: N/A  . Years of education: N/A   Occupational History  . Not on file.   Social History Main Topics  . Smoking status: Never Smoker  . Smokeless tobacco: Never Used  . Alcohol use No  . Drug use: No  . Sexual activity: Not on file   Other Topics Concern  . Not on file   Social History Narrative  . No narrative on file    FAMILY HISTORY:   Family Status  Relation Status  . Mother Alive       rheumatoid arthritis  . Father Deceased at age 36       esophageal cancer  . MGF Deceased       esophagel cancer  . PGM Deceased  . PGF Deceased  . Sister Alive  . Brother Alive  . Daughter Alive  . MGM Deceased  . Sister Alive  . Sister Deceased  . Sister Alive  . Daughter Alive  . Ethlyn Daniels (Not Specified)  . Cousin (Not Specified)  . Neg Hx (Not Specified)    ROS:  A complete 10 system review of systems was obtained and was unremarkable apart from what is mentioned above.  PHYSICAL EXAMINATION:    VITALS:   There were no vitals filed for this visit. Wt Readings from Last 3 Encounters:  01/02/17 205 lb (93 kg)  12/26/16 197 lb (89.4 kg)  11/22/16 204 lb (92.5 kg)     GEN:  The patient appears stated age and is in NAD. HEENT:  Normocephalic, atraumatic.  The  mucous membranes are moist. The superficial temporal arteries are without ropiness or tenderness. CV:  RRR Lungs:  CTAB Neck/HEME:  There are no carotid bruits bilaterally.  Neurological examination:  Orientation: The patient is alert and oriented x3.  Cranial nerves: There is good facial symmetry. Pupils are equal round and reactive to light bilaterally. Fundoscopic exam reveals clear margins bilaterally. Extraocular muscles are intact. The visual fields  are full to confrontational testing. The speech is fluent and clear. Soft palate rises symmetrically and there is no tongue deviation. Hearing is intact to conversational tone. Sensation: Sensation is intact to light touch throughout. Motor: Strength is 5/5 in the bilateral upper and lower extremities.   Shoulder shrug is equal and symmetric.  There is no pronator drift.  No fasciculations.  Movement examination: Tone: There is normal tone in the bilateral UE Abnormal movements: no tremor noted today Coordination:  There is minor decrease in finger taps on the right.  Rare trouble with hand opening and closing on the left.  He has some trouble with toe taps on the L (but attributes to sciatica).   Gait and Station: The patient has no difficulty arising out of a deep-seated chair without the use of the hands. The patient's stride length is normal with slight decrease in arm swing on the right.  Negative pull test.  Runs down the hall without trouble.  LABS  Lab Results  Component Value Date   TSH 1.03 08/23/2016     Chemistry      Component Value Date/Time   NA 141 08/23/2016 0944   K 3.8 08/23/2016 0944   CL 100 08/23/2016 0944   CO2 33 (H) 08/23/2016 0944   BUN 13 08/23/2016 0944   CREATININE 0.72 08/23/2016 0944      Component Value Date/Time   CALCIUM 9.4 08/23/2016 0944   ALKPHOS 74 08/23/2016 0944   AST 21 08/23/2016 0944   ALT 22 08/23/2016 0944   BILITOT 3.1 (H) 08/23/2016 0944     Lab Results  Component Value Date     PSA 0.80 09/18/2016   PSA 4.70 (H) 08/23/2016   PSA 0.68 05/27/2015     ASSESSMENT/PLAN:  1. Parkinsons disease  -Doing well on mirapex ER - 1.5 mg daily and we will continue that. Risks, benefits, side effects and alternative therapies were discussed.  The opportunity to ask questions was given and they were answered to the best of my ability.  The patient expressed understanding and willingness to follow the outlined treatment protocols.  -continue with faithful exercise.    -he is going to be starting the  support group.   Congratulated him on that.  2.  Abnormal PSA  -PSA much higher last visit but reports it was due to kidney stone and once he passed it the PSA went down  3.  Insomnia  -too groggy with klonopin.  States tried melatonin without relief (unknown dose).  Somewhat of a short phase sleeper  4.  Sciatica  -to see Dr. Nelva Bush on 12/12/16.  Pt now contemplating sx  5. Follow up is anticipated in the next few months, sooner should new neurologic issues arise.  Much greater than 50% of this visit was spent in counseling and coordinating care.  Total face to face time:  20 min

## 2017-04-04 ENCOUNTER — Ambulatory Visit (INDEPENDENT_AMBULATORY_CARE_PROVIDER_SITE_OTHER): Payer: Medicare Other | Admitting: Neurology

## 2017-04-04 ENCOUNTER — Encounter: Payer: Self-pay | Admitting: Family Medicine

## 2017-04-04 ENCOUNTER — Encounter: Payer: Self-pay | Admitting: Neurology

## 2017-04-04 VITALS — BP 120/88 | HR 86 | Ht 71.0 in | Wt 202.0 lb

## 2017-04-04 DIAGNOSIS — G20 Parkinson's disease: Secondary | ICD-10-CM

## 2017-04-04 MED ORDER — PRAMIPEXOLE DIHYDROCHLORIDE ER 1.5 MG PO TB24: 1.0000 | ORAL_TABLET | Freq: Every day | ORAL | 1 refills | Status: DC

## 2017-04-16 ENCOUNTER — Encounter: Payer: Self-pay | Admitting: Family Medicine

## 2017-04-16 ENCOUNTER — Ambulatory Visit (INDEPENDENT_AMBULATORY_CARE_PROVIDER_SITE_OTHER): Payer: Medicare Other | Admitting: Family Medicine

## 2017-04-16 VITALS — BP 117/78 | HR 81 | Temp 98.1°F | Ht 71.0 in | Wt 206.0 lb

## 2017-04-16 DIAGNOSIS — Z23 Encounter for immunization: Secondary | ICD-10-CM

## 2017-04-16 DIAGNOSIS — M25422 Effusion, left elbow: Secondary | ICD-10-CM

## 2017-04-16 NOTE — Patient Instructions (Signed)
WE NOW OFFER   [facility] FAST TRACK!!!  SAME DAY Appointments for ACUTE CARE  Such as: Sprains, Injuries, cuts, abrasions, rashes, muscle pain, joint pain, back pain Colds, flu, sore throats, headache, allergies, cough, fever  Ear pain, sinus and eye infections Abdominal pain, nausea, vomiting, diarrhea, upset stomach Animal/insect bites  3 Easy Ways to Schedule: Walk-In Scheduling Call in scheduling Mychart Sign-up: [email or web address]         

## 2017-04-16 NOTE — Progress Notes (Signed)
   Subjective:    Patient ID: [name] [name], male    DOB: Apr 07, 1951, 66 y.o.   MRN: [id number]  HPI Here for 2 weeks of swelling at the left elbow. No recent trauma. This is not painful.    Review of Systems  Constitutional: Negative.   Respiratory: Negative.   Cardiovascular: Negative.   Musculoskeletal: Positive for joint swelling.       Objective:   Physical Exam  Constitutional: He appears well-developed and well-nourished.  Cardiovascular: Normal rate, regular rhythm, normal heart sounds and intact distal pulses.   Pulmonary/Chest: Effort normal and breath sounds normal. No respiratory distress. He has no wheezes. He has no rales.  Musculoskeletal:  The left olecranon bursa is swollen but not warm or tender. ROM is full.           Assessment & Plan:  Olecranon bursa effusion. He will wear a compression bandage for a few weeks to allow this to reabsorb. Rest the arm. Recheck prn.  [name], MD

## 2017-07-16 HISTORY — PX: CYST REMOVAL HAND: SHX6279

## 2017-08-07 NOTE — Progress Notes (Signed)
[name] [name] was seen today in the movement disorders clinic for neurologic consultation at the request of [name], MD.  The consultation is for the evaluation of tremor.  Tremor started 1 year ago, but it has been getting worse since October.  The pt is R hand dominant.  The pt has noted worsening.  At first, it was positional and intermittent but that has changed.  It is more constant now.  He notices it at rest.  He can make it stop for a short period of time by concentrating on it.    11/18/13 update:  Pt presents today for follow up, accompanied by his wife who supplements the history.  She was not present last visit.  Pt states that he is doing better on the Mirapex.  He feels that he is about 98% better.  He denies SE with the medication.  No compulsive behaviors are noted.  Feeling well.  Exercising.  No falls.  No hallucinations.  Tremor feels like it is just under the surface but not visible.  No lightheadedness.    02/17/14 update:  Pt is f/u today regarding PD.  He is feeling great on mirapex er, 1.5 mg daily.  Feels sleepy if he relaxes but isn't able to sleep if he tries.  Pt states that he rarely feels/sees tremor.  Balance has been good.  Exercising faithfully.  Has had several kidney stones and asks if related to mirapex.  No hallucinations.  No lightheadedness.    06/22/14 update:  Pt is f/u today re: PD.  Doing well on mirapex ER 1.5 mg daily.  Good balance.  No falls.  No hallucinations. No compulsive behaviors.  Exercising a lot.  States that most day he wouldn't even know he had PD.   The records that were made available to me were reviewed.  Been on several abx for prostatitis.  Saw that he called pcp for phenergan suppositories but states that he only uses it rarely as he had a nissen fundoplication.  He has only used one.  Had derm appt and no evidence of CA.  10/25/14 update:  Pt is accompanied by his wife who supplements the history.  He states that it is "time" to  replace the shoulder.  He has it scheduled but not until December 1.  He isn't sure if it isn't under general anesthesia.  He was prescribed phenergan but he almost never uses it (twice in three years).  He will have no tremor for days to weeks and then may have some tremor.  He has some EDS when he stops when he sits and relaxes.  States if he tried to take a nap, though, he won't be able to sleep.    He doesn't snore at night.  He does take temazepam for sleep at night.  He notices balance isn't quite as good as it used to be.  He hasn't fallen.   He exercises faithfully.  He asks about DBS therapy.  03/14/15 update:  Pt is f/u today.  He is doing well from a PD standpoint.  He remains on mirapex er, 1.5 mg daily.  He is having his shoulder replaced in December.  He is going to be under general anesthesia for surgery.  He states that he had no tremor from last visit until last week, but thinks that was mostly thinking about this visit.  It actually went away over the last few days.  He notes that pain in  the shoulder and coldness in the grocery store will bring out the tremor.  He states that he had 2 episodes of anxiety with driving in [place] and in [place] that he was unusual as he knows [place] well (lived there for a long time).  Still trouble with sleeping despite temazepam and has to be "careful" with that because it causes him to sleep eat.   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    07/28/15 update:  The patient is following up today.  He remains on pramipexole ER, 1.5 mg daily.  He is doing well in that regard.  Tremor has been minimal.  No falls.  "My balance is perfect."  No hallucinations.  No lightheadedness or near syncope.  He went off temazepam for insomnia (states that was "brutal" and did that b/c he was binge eating at night) and last visit I told him to add melatonin, 3 mg. He tried that and it didn't help.  He is doing some better but  asks me about what medication he can try instead.   I did review records since our last visit.  He had a shoulder replacement on the right on December 8.  He is restricted to no movement for 5 weeks but states that he has had little pain since the surgery.  Exercise has been a little minimal because of this.  Going to Dr. Nelva Bush for sciatica and plans to have injection on the L.  12/05/15 update:  The patient is following up today.  He remains on pramipexole ER, 1.5 mg daily.  He is doing well in that regard.  No falls.  No hallucinations.  No lightheadedness or near syncope.  We had started clonazepam last visit for insomnia, and he states that it made him groggy and he d/c that.   He had a shoulder replacement in December, and when I saw him last, he still was not able to move that.  The patient reports that he has actually recovered nicely from that surgery.  I did review records since our last visit.  Dr. Nelva Bush sent me know that the patient did have an MRI of his lumbar spine that demonstrated mild degenerative changes.  He was given gabapentin, but according to notes, the patient did not want to take that until he talked to me.  The patient felt that side effects were similar to Parkinson's disease, although Dr. Nelva Bush told him that the biggest side effects or legs swelling and cognitive changes.  Pt states that it is keeping him awake most nights and it is worse when he sits down and it radiates down the L leg.  He is still having insomnia.  04/10/16 update:  Pt f/u today, still doing well on mirapex ER, 1.5 mg daily.  No compulsive behaviors.  No sleep attacks.  No falls, hallucinations, near syncope.  States that he really only has tremor when he comes to see me.  Otherwise, he feels that tremor is well controlled.  Continues to exercise.  Last visit, biggest issues was sciatica.  Started on gabapentin for that but didn't find it helpful.  Tried lyrica but also not helpful.  States that he is seeing  chiropractor for it now.  If he lays or sits, it "was hell."  If he moves, it helps.  Pain radiating down the left leg.  Has had MRI of the lumbar spin with Dr. Nelva Bush and only with  degenerative changes.  Thinks that chiropractics have been some help but just started going.  "my balance is better the last year or two than when I was 67 years old."  08/24/16 update:  Pt f/u today, on pramipexole ER, 1.5 mg daily.  Feels well on the medication. Still struggling with sciatica.  Doing Yoga.  Swimming for exercise now.   Pt denies falls.  Pt denies lightheadedness, near syncope.  No hallucinations.  Mood has been good.  No compulsive behaviors or sleep attacks.  Only notes tremor with cold or when sciatica is bad.    11/22/16 update:  Patient seen today in follow-up.  He remains on pramipexole ER, 1.5 mg daily.  Denies sleep attacks.  Denies compulsive behaviors.  Continues to swim for exercise.  He is very active in his yard.  He denies any lightheadedness or near syncope.  He denies hallucinations.  He denies delusions.  His mood has been good.  He denies falls.  He is still having sciatic pain and is back to see Dr. Nelva Bush on 5/30.  He is considering surgical intervention.    04/04/17 update:  Pt f/u today for PD.  On pramipexole ER, 1.5 mg daily.  No compulsive behaviors.  No sleep attacks.  Continues to exercise daily.  The records that were made available to me were reviewed since last visit.  Last saw [name], MD on 01/02/17.  Sciatica was doing great but having issues again.  He gave up swimming and states that "I move rocks instead."  He has been doing lots of work in his yard.  Wearing off:  No.  How long before next dose:  n/a Falls:   No. N/V:  No. Hallucinations:  No.  visual distortions: No. Lightheaded:  No.  Syncope: No. Dyskinesia:  No.  08/08/17 update:  Pt seen in f/u for PD.  He continues to do well.  On pramipexole ER 1.5 mg daily.  Pt denies falls.  Pt denies lightheadedness, near  syncope.  No hallucinations.  Mood has been good.  The records that were made available to me were reviewed.  Mom died from mouth CA since our last visit.  Still having issues with his sciatic pain and now going to accupuncture and thinks that it helps.  Asks about sleep meds  PREVIOUS MEDICATIONS: klonopin for insomnia made groggy  ALLERGIES:   Allergies  Allergen Reactions  . Penicillins Rash    CURRENT MEDICATIONS:  Current Outpatient Medications on File Prior to Visit  Medication Sig Dispense Refill  . atorvastatin (LIPITOR) 20 MG tablet Take 1 tablet (20 mg total) by mouth daily. 90 tablet 3  . finasteride (PROSCAR) 5 MG tablet Take 5 mg by mouth daily.    . hydrochlorothiazide (MICROZIDE) 12.5 MG capsule Take 1 capsule (12.5 mg total) by mouth daily. 90 capsule 3  . polyethylene glycol (MIRALAX / GLYCOLAX) packet Take 17 g by mouth daily.    . Pramipexole Dihydrochloride (MIRAPEX ER) 1.5 MG TB24 Take 1 tablet (1.5 mg total) by mouth daily. 90 tablet 1  . tamsulosin (FLOMAX) 0.4 MG CAPS capsule Take 1 capsule (0.4 mg total) by mouth daily. 90 capsule 3  . traMADol (ULTRAM) 50 MG tablet Take 2 tablets (100 mg total) by mouth at bedtime. 180 tablet 1   Current Facility-Administered Medications on File Prior to Visit  Medication Dose Route Frequency Provider Last Rate Last Dose  . 0.9 %  sodium chloride infusion  500 mL Intravenous Continuous Ardis Hughs,  Melene Plan, MD        PAST MEDICAL HISTORY:   Past Medical History:  Diagnosis Date  . Arthritis   . Barrett's esophagus    sees Dr. Delfin Edis   . BPH with urinary obstruction   . Complication of anesthesia    Urinary retention. "fights it"- is what he was told. 1990's - vomitted and aspirated in surgery  . Diverticulosis   . Esophageal dysmotility   . GERD (gastroesophageal reflux disease)    not since [facility] Fundop[facility]  . History of hiatal hernia   . History of kidney stones    15 in 2015- last one was in Oct 2015- takes  HCTZ to help prevent  . Hyperlipidemia   . Insomnia   . Lyme disease 2013  . Parkinson's disease [facility])    sees Dr. Wells Guiles Harlowe Dowler   . RMSF [facility] spotted fever) 2012    PAST SURGICAL HISTORY:   Past Surgical History:  Procedure Laterality Date  . CHOLECYSTECTOMY    . COLONOSCOPY  06-24-13   per Dr. Olevia Perches, no polyps, diveticulosis only, repeat in 10 yrs   . ESOPHAGOGASTRODUODENOSCOPY  06-24-13   per Dr. Olevia Perches, no signs of Barretts, repeat in 5 yrs   . HERNIA REPAIR Right    right inguinal  . LASIK     bilateral  . lipomas removed     x10/ on arms and legs  . NISSEN FUNDOPLICATION  1-75-10   laparoscopic per Dr. Johnathan Hausen  . ORCHIOPEXY     right side at age 54   . TONSILLECTOMY AND ADENOIDECTOMY    . TOTAL SHOULDER ARTHROPLASTY Right 06/23/2015   Procedure: RIGHT TOTAL SHOULDER ARTHROPLASTY;  Surgeon: Justice Britain, MD;  Location: [facility];  Service: Orthopedics;  Laterality: Right;    SOCIAL HISTORY:   Social History   Socioeconomic History  . Marital status: Married    Spouse name: Not on file  . Number of children: Not on file  . Years of education: Not on file  . Highest education level: Not on file  Social Needs  . Financial resource strain: Not on file  . Food insecurity - worry: Not on file  . Food insecurity - inability: Not on file  . Transportation needs - medical: Not on file  . Transportation needs - non-medical: Not on file  Occupational History  . Not on file  Tobacco Use  . Smoking status: Never Smoker  . Smokeless tobacco: Never Used  Substance and Sexual Activity  . Alcohol use: No    Alcohol/week: 0.0 oz  . Drug use: No  . Sexual activity: Not on file  Other Topics Concern  . Not on file  Social History Narrative  . Not on file    FAMILY HISTORY:   Family Status  Relation Name Status  . Mother  Alive       rheumatoid arthritis  . Father  Deceased at age 18       esophageal cancer  . MGF  Deceased       esophagel cancer  .  PGM  Deceased  . PGF  Deceased  . Sister  Alive  . Brother  Alive  . Daughter  Alive  . MGM  Deceased  . Sister  Alive  . Sister  Deceased  . Sister  Alive  . Daughter  Alive  . Ethlyn Daniels  (Not Specified)  . Cousin  (Not Specified)  . Neg Hx  (Not Specified)  ROS:  A complete 10 system review of systems was obtained and was unremarkable apart from what is mentioned above.  PHYSICAL EXAMINATION:    VITALS:   Vitals:   08/08/17 1249  BP: 138/82  Pulse: 94  SpO2: 94%  Weight: 214 lb (97.1 kg)  Height: 5\' 11"  (1.803 m)    Gen:  Appears stated age and in NAD. HEENT:  Normocephalic, atraumatic. The mucous membranes are moist. The superficial temporal arteries are without ropiness or tenderness. Cardiovascular: Regular rate and rhythm. Lungs: Clear to auscultation bilaterally. Neck: There are no carotid bruits noted bilaterally.  NEUROLOGICAL:  Orientation:  The patient is alert and oriented x 3.   Cranial nerves: There is good facial symmetry.  Extraocular muscles are intact and visual fields are full to confrontational testing. Speech is fluent and clear. Soft palate rises symmetrically and there is no tongue deviation. Hearing is intact to conversational tone. Tone: Tone is good throughout. Sensation: Sensation is intact to light touch and pinprick throughout (facial, trunk, extremities) Coordination:  The patient has no difficulty with RAM's or FNF bilaterally. Motor: Strength is 5/5 in the bilateral upper and lower extremities.  Shoulder shrug is equal bilaterally.  There is no pronator drift.  There are no fasciculations noted. Gait and Station: The patient is able to ambulate without difficulty.   He has mild trouble out of the chair due to sciatica    Movement examination: Tone: There is normal tone in the bilateral UE Abnormal movements: no tremor noted today Coordination:  There is minor decrease in finger taps on the right.  Rare trouble with toe taps on the  L Gait and Station: The patient has no difficulty arising out of a deep-seated chair without the use of the hands. The patient's stride length is normal with good arm swing bilaterally  LABS  Lab Results  Component Value Date   TSH 1.03 08/23/2016     Chemistry      Component Value Date/Time   NA 141 08/23/2016 0944   K 3.8 08/23/2016 0944   CL 100 08/23/2016 0944   CO2 33 (H) 08/23/2016 0944   BUN 13 08/23/2016 0944   CREATININE 0.72 08/23/2016 0944      Component Value Date/Time   CALCIUM 9.4 08/23/2016 0944   ALKPHOS 74 08/23/2016 0944   AST 21 08/23/2016 0944   ALT 22 08/23/2016 0944   BILITOT 3.1 (H) 08/23/2016 0944     Lab Results  Component Value Date   PSA 0.80 09/18/2016   PSA 4.70 (H) 08/23/2016   PSA 0.68 05/27/2015     ASSESSMENT/PLAN:  1. Parkinsons disease  -continue pramipexole ER, 1.5 mg daily.  Risks, benefits, side effects and alternative therapies were discussed.  The opportunity to ask questions was given and they were answered to the best of my ability.  The patient expressed understanding and willingness to follow the outlined treatment protocols.  -continue with faithful exercise.    -went to the [place] support group.    -talked about new drumming class  2.  Abnormal PSA  -PSA much higher last visit but reports it was due to kidney stone and once he passed it the PSA went down  3.  Insomnia  -too groggy with klonopin.  States tried melatonin without relief (unknown dose).  Talked about remeron and wants to hold on that.  4.  Sciatica  -to see Dr. Nelva Bush on 2/23.  He is having more pain.    5. Follow up  is anticipated in the next few months, sooner should new neurologic issues arise.  Much greater than 50% of this visit was spent in counseling and coordinating care.  Total face to face time:  25 min

## 2017-08-08 ENCOUNTER — Ambulatory Visit (INDEPENDENT_AMBULATORY_CARE_PROVIDER_SITE_OTHER): Payer: Medicare Other | Admitting: Neurology

## 2017-08-08 ENCOUNTER — Encounter: Payer: Self-pay | Admitting: Neurology

## 2017-08-08 VITALS — BP 138/82 | HR 94 | Ht 71.0 in | Wt 214.0 lb

## 2017-08-08 DIAGNOSIS — G20 Parkinson's disease: Secondary | ICD-10-CM | POA: Diagnosis not present

## 2017-08-08 DIAGNOSIS — M5431 Sciatica, right side: Secondary | ICD-10-CM | POA: Diagnosis not present

## 2017-08-08 DIAGNOSIS — G20A1 Parkinson's disease without dyskinesia, without mention of fluctuations: Secondary | ICD-10-CM

## 2017-08-08 MED ORDER — PRAMIPEXOLE DIHYDROCHLORIDE ER 1.5 MG PO TB24: 1.0000 | ORAL_TABLET | Freq: Every day | ORAL | 1 refills | Status: DC

## 2017-08-26 ENCOUNTER — Other Ambulatory Visit: Payer: Self-pay | Admitting: Family Medicine

## 2017-08-27 NOTE — Telephone Encounter (Signed)
Last OV: 06/18/2018

## 2017-09-02 ENCOUNTER — Ambulatory Visit (INDEPENDENT_AMBULATORY_CARE_PROVIDER_SITE_OTHER): Payer: Medicare Other | Admitting: Family Medicine

## 2017-09-02 ENCOUNTER — Encounter: Payer: Self-pay | Admitting: Family Medicine

## 2017-09-02 VITALS — BP 148/74 | HR 96 | Temp 97.4°F | Ht 70.0 in | Wt 210.5 lb

## 2017-09-02 DIAGNOSIS — G20 Parkinson's disease: Secondary | ICD-10-CM | POA: Diagnosis not present

## 2017-09-02 DIAGNOSIS — K219 Gastro-esophageal reflux disease without esophagitis: Secondary | ICD-10-CM

## 2017-09-02 DIAGNOSIS — N138 Other obstructive and reflux uropathy: Secondary | ICD-10-CM | POA: Diagnosis not present

## 2017-09-02 DIAGNOSIS — M5432 Sciatica, left side: Secondary | ICD-10-CM

## 2017-09-02 DIAGNOSIS — N401 Enlarged prostate with lower urinary tract symptoms: Secondary | ICD-10-CM | POA: Diagnosis not present

## 2017-09-02 DIAGNOSIS — E785 Hyperlipidemia, unspecified: Secondary | ICD-10-CM

## 2017-09-02 LAB — LIPID PANEL
CHOL/HDL RATIO: 4
Cholesterol: 145 mg/dL (ref 0–200)
HDL: 36.5 mg/dL — ABNORMAL LOW (ref >39.00)
NONHDL: 108.17
Triglycerides: 266 mg/dL — ABNORMAL HIGH (ref 0.0–149.0)
VLDL: 53.2 mg/dL — ABNORMAL HIGH (ref 0.0–40.0)

## 2017-09-02 LAB — CBC WITH DIFFERENTIAL/PLATELET
BASOS ABS: 0 10*3/uL (ref 0.0–0.1)
BASOS PCT: 0.8 % (ref 0.0–3.0)
EOS ABS: 0.1 10*3/uL (ref 0.0–0.7)
Eosinophils Relative: 1.8 % (ref 0.0–5.0)
HEMATOCRIT: 42.8 % (ref 39.0–52.0)
HEMOGLOBIN: 15.4 g/dL (ref 13.0–17.0)
LYMPHS PCT: 21.2 % (ref 12.0–46.0)
Lymphs Abs: 1.3 10*3/uL (ref 0.7–4.0)
MCHC: 35.9 g/dL (ref 30.0–36.0)
MCV: 88.7 fl (ref 78.0–100.0)
Monocytes Absolute: 0.4 10*3/uL (ref 0.1–1.0)
Monocytes Relative: 7.5 % (ref 3.0–12.0)
Neutro Abs: 4.1 10*3/uL (ref 1.4–7.7)
Neutrophils Relative %: 68.7 % (ref 43.0–77.0)
Platelets: 213 10*3/uL (ref 150.0–400.0)
RBC: 4.83 Mil/uL (ref 4.22–5.81)
RDW: 13.4 % (ref 11.5–15.5)
WBC: 5.9 10*3/uL (ref 4.0–10.5)

## 2017-09-02 LAB — BASIC METABOLIC PANEL
BUN: 14 mg/dL (ref 6–23)
CHLORIDE: 99 meq/L (ref 96–112)
CO2: 31 mEq/L (ref 19–32)
Calcium: 9.3 mg/dL (ref 8.4–10.5)
Creatinine, Ser: 0.69 mg/dL (ref 0.40–1.50)
GFR: 122 mL/min (ref >60.00)
Glucose, Bld: 110 mg/dL — ABNORMAL HIGH (ref 70–99)
POTASSIUM: 4.1 meq/L (ref 3.5–5.1)
SODIUM: 140 meq/L (ref 135–145)

## 2017-09-02 LAB — TSH: TSH: 2.64 u[IU]/mL (ref 0.35–4.50)

## 2017-09-02 LAB — POC URINALSYSI DIPSTICK (AUTOMATED)
Bilirubin, UA: NEGATIVE
GLUCOSE UA: NEGATIVE
Ketones, UA: NEGATIVE
Leukocytes, UA: NEGATIVE
NITRITE UA: NEGATIVE
PH UA: 6 (ref 5.0–8.0)
PROTEIN UA: NEGATIVE
RBC UA: NEGATIVE
Spec Grav, UA: 1.025 (ref 1.010–1.025)
UROBILINOGEN UA: 0.2 U/dL

## 2017-09-02 LAB — LDL CHOLESTEROL, DIRECT: Direct LDL: 67 mg/dL

## 2017-09-02 LAB — HEPATIC FUNCTION PANEL
ALT: 29 U/L (ref 0–53)
AST: 25 U/L (ref 0–37)
Albumin: 4.2 g/dL (ref 3.5–5.2)
Alkaline Phosphatase: 68 U/L (ref 39–117)
BILIRUBIN DIRECT: 0.4 mg/dL — AB (ref 0.0–0.3)
BILIRUBIN TOTAL: 2.9 mg/dL — AB (ref 0.2–1.2)
Total Protein: 6.6 g/dL (ref 6.0–8.3)

## 2017-09-02 MED ORDER — TRAMADOL HCL 50 MG PO TABS: 100.0000 mg | ORAL_TABLET | Freq: Every day | ORAL | 1 refills | Status: DC

## 2017-09-02 MED ORDER — GABAPENTIN 300 MG PO CAPS: 300.0000 mg | ORAL_CAPSULE | Freq: Three times a day (TID) | ORAL | 3 refills | Status: DC

## 2017-09-02 NOTE — Progress Notes (Signed)
   Subjective:    Patient ID: [name] [name], male    DOB: 04/15/1951, 67 y.o.   MRN: [id number]  HPI Here to follow up on issues. His GERD is controlled and he will not need any further upper endoscopies. His sciatica continues to be a problem however and he has not been able to exercise as much as he likes due to pain. He does not like to take pain medications during the day however because he is worried about potential sedation. He does take Tramadol at bedtime every night, but the pain usually interferes with his sleep. His Parkinson's remains quite stable and he will see Dr. Carles Collet again next month.    Review of Systems  Constitutional: Negative.   HENT: Negative.   Eyes: Negative.   Respiratory: Negative.   Cardiovascular: Negative.   Gastrointestinal: Negative.   Genitourinary: Negative.   Musculoskeletal: Positive for back pain. Negative for arthralgias, gait problem, joint swelling, myalgias, neck pain and neck stiffness.  Skin: Negative.   Neurological: Negative.   Psychiatric/Behavioral: Negative.        Objective:   Physical Exam  Constitutional: He is oriented to person, place, and time. He appears well-developed and well-nourished. No distress.  HENT:  Head: Normocephalic and atraumatic.  Right Ear: External ear normal.  Left Ear: External ear normal.  Nose: Nose normal.  Mouth/Throat: Oropharynx is clear and moist. No oropharyngeal exudate.  Eyes: Conjunctivae and EOM are normal. Pupils are equal, round, and reactive to light. Right eye exhibits no discharge. Left eye exhibits no discharge. No scleral icterus.  Neck: Neck supple. No JVD present. No tracheal deviation present. No thyromegaly present.  Cardiovascular: Normal rate, regular rhythm, normal heart sounds and intact distal pulses. Exam reveals no gallop and no friction rub.  No murmur heard. Pulmonary/Chest: Effort normal and breath sounds normal. No respiratory distress. He has no wheezes. He has no  rales. He exhibits no tenderness.  Abdominal: Soft. Bowel sounds are normal. He exhibits no distension and no mass. There is no tenderness. There is no rebound and no guarding.  Genitourinary: Rectum normal, prostate normal and penis normal. Rectal exam shows guaiac negative stool. No penile tenderness.  Musculoskeletal: Normal range of motion. He exhibits no edema or tenderness.  Lymphadenopathy:    He has no cervical adenopathy.  Neurological: He is alert and oriented to person, place, and time. He has normal reflexes. No cranial nerve deficit. He exhibits normal muscle tone. Coordination normal.  Skin: Skin is warm and dry. No rash noted. He is not diaphoretic. No erythema. No pallor.  Psychiatric: He has a normal mood and affect. His behavior is normal. Judgment and thought content normal.          Assessment & Plan:  He seems to be doing well except for the sciatica pain. We will add Gabapentin 300 mg to this Tramadol at bedtime. He will follow up with Dr. Carles Collet as above. He will see his Urologist soon for a prostate exam. Get fasting labs.  [name], MD

## 2017-09-04 DIAGNOSIS — M5416 Radiculopathy, lumbar region: Secondary | ICD-10-CM | POA: Insufficient documentation

## 2017-09-10 ENCOUNTER — Encounter: Payer: Self-pay | Admitting: Family Medicine

## 2017-09-10 ENCOUNTER — Ambulatory Visit (INDEPENDENT_AMBULATORY_CARE_PROVIDER_SITE_OTHER): Payer: Medicare Other | Admitting: Family Medicine

## 2017-09-10 ENCOUNTER — Other Ambulatory Visit: Payer: Self-pay

## 2017-09-10 VITALS — BP 120/68 | HR 74 | Temp 98.4°F | Wt 213.2 lb

## 2017-09-10 DIAGNOSIS — J069 Acute upper respiratory infection, unspecified: Secondary | ICD-10-CM | POA: Diagnosis not present

## 2017-09-10 DIAGNOSIS — K429 Umbilical hernia without obstruction or gangrene: Secondary | ICD-10-CM

## 2017-09-10 NOTE — Progress Notes (Signed)
   Subjective:    Patient ID: [name] [name], male    DOB: 04/28/1951, 67 y.o.   MRN: [id number]  HPI Here for 5 days of a dry cough, which is slowly improving. He had fevers to 100 degrees at first but not now. Drinking fluids. Some body aches and headache. Also after a hard fit of coughing he noticed a lump above his belly button. This was not painful. It has receded a bit in the past few days.    Review of Systems  Constitutional: Positive for fever.  HENT: Negative.   Eyes: Negative.   Respiratory: Positive for cough and wheezing.   Cardiovascular: Negative.   Gastrointestinal: Negative.        Objective:   Physical Exam  Constitutional: He appears well-developed and well-nourished.  HENT:  Right Ear: External ear normal.  Left Ear: External ear normal.  Nose: Nose normal.  Mouth/Throat: Oropharynx is clear and moist.  Eyes: Conjunctivae are normal.  Neck: No thyromegaly present.  Cardiovascular: Normal rate, regular rhythm, normal heart sounds and intact distal pulses.  Pulmonary/Chest: Effort normal and breath sounds normal. No respiratory distress. He has no wheezes. He has no rales.  Abdominal: Soft. Bowel sounds are normal. He exhibits no distension. There is no tenderness. There is no rebound and no guarding.  There is a small easily reducible non-tender hernia just superior to the umbilicus   Lymphadenopathy:    He has no cervical adenopathy.          Assessment & Plan:  He has a viral URI which seems to be resolving. He can use Delsym for cough. He also has a new umbilical hernia. We will observe this for now.  [name], MD

## 2017-09-12 DIAGNOSIS — M5416 Radiculopathy, lumbar region: Secondary | ICD-10-CM | POA: Diagnosis not present

## 2017-10-07 ENCOUNTER — Telehealth: Payer: Self-pay | Admitting: Family Medicine

## 2017-10-07 NOTE — Telephone Encounter (Signed)
Copied from [place] [id number]. Topic: Quick Communication - See Telephone Encounter >> Oct 07, 2017  2:10 PM Boyd Kerbs wrote: CRM for notification.  Wants to add Ibuprofen 800mg  on his medication.   Call into  [facility] [place] [address] Phone: [phone] Fax: [phone]    See Telephone encounter for: 10/07/17.

## 2017-10-08 NOTE — Telephone Encounter (Signed)
Sent to PCP for approval.  

## 2017-10-08 NOTE — Telephone Encounter (Signed)
Ibuprofen request Last OV: 09/02/17 (addresses sciatica) Last Refill:n/a Pharmacy:[facility] [place]. PCP. Dr Sarajane Jews Sx: pt wanting to try Ibuprofen 800 mg for his sciatica.

## 2017-10-09 MED ORDER — IBUPROFEN 800 MG PO TABS: 800.0000 mg | ORAL_TABLET | Freq: Three times a day (TID) | ORAL | 3 refills | Status: DC | PRN

## 2017-10-09 NOTE — Telephone Encounter (Signed)
[facility]  

## 2017-10-09 NOTE — Telephone Encounter (Signed)
Called pt and left a VM that medication has been sent to [facility].

## 2017-10-17 ENCOUNTER — Ambulatory Visit: Payer: Self-pay

## 2017-10-17 NOTE — Telephone Encounter (Signed)
Pt calling with c/o bilateral ankle and top of foot swelling. He states the pain to the bottom of his feet is severe (feels like walking on glass) but the pain subsides  with ambulation. 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 Care advice given. Disposition was to see PCP when office is open in 3 days. Pt did not appointment within protocol and wanted to only see his PCP. He stated he wanted to see him on 10/24/17. Appointment made with his PCP.  Reason for Disposition . [1] MODERATE pain (e.g., interferes with normal activities, limping) AND [2] present > 3 days  Answer Assessment - Initial Assessment Questions 1. LOCATION: "Which joint is swollen?"     Ankle and top of foot 2. ONSET: "When did the swelling start?"     1 week ago 3. SIZE: "How large is the swelling?"    Non pitting edema 4. PAIN: "Is there any pain?" If so, ask: "How bad is it?" (Scale 1-10; or mild, moderate, severe)     Feet hurt can be severe 8-9 gets better with walking 5. CAUSE: "What do you think caused the swollen joint?"     Thinks its connected to sciatica, works outside, Parkinsons dx 6. OTHER SYMPTOMS: "Do you have any other symptoms?" (e.g., fever, chest pain, difficulty breathing, calf pain) Sciatica no other sx 7. PREGNANCY: "Is there any chance you are pregnant?" "When was your last menstrual period?"     n/a  Protocols used: ANKLE SWELLING-A-AH

## 2017-10-24 ENCOUNTER — Encounter: Payer: Self-pay | Admitting: Family Medicine

## 2017-10-24 ENCOUNTER — Ambulatory Visit (INDEPENDENT_AMBULATORY_CARE_PROVIDER_SITE_OTHER): Payer: Medicare Other | Admitting: Family Medicine

## 2017-10-24 VITALS — BP 124/76 | HR 89 | Temp 98.0°F | Ht 70.0 in | Wt 210.8 lb

## 2017-10-24 DIAGNOSIS — G4700 Insomnia, unspecified: Secondary | ICD-10-CM | POA: Diagnosis not present

## 2017-10-24 DIAGNOSIS — R600 Localized edema: Secondary | ICD-10-CM

## 2017-10-24 LAB — BASIC METABOLIC PANEL
BUN: 15 mg/dL (ref 6–23)
CHLORIDE: 101 meq/L (ref 96–112)
CO2: 31 mEq/L (ref 19–32)
Calcium: 9.4 mg/dL (ref 8.4–10.5)
Creatinine, Ser: 0.79 mg/dL (ref 0.40–1.50)
GFR: 104.31 mL/min (ref >60.00)
Glucose, Bld: 101 mg/dL — ABNORMAL HIGH (ref 70–99)
POTASSIUM: 3.5 meq/L (ref 3.5–5.1)
Sodium: 140 mEq/L (ref 135–145)

## 2017-10-24 MED ORDER — TRAZODONE HCL 50 MG PO TABS: 50.0000 mg | ORAL_TABLET | Freq: Every evening | ORAL | 2 refills | Status: DC | PRN

## 2017-10-24 NOTE — Progress Notes (Signed)
   Subjective:    Patient ID: AUBRA PAPPALARDO, male    DOB: 05/29/1951, 67 y.o.   MRN: [id number]  HPI Here for several issues. First he has had swelling in both legs and both feet for 2 weeks. This gets worse during the day and goes dwon at night. No SOB. Also he still as trouble sleeping and asks for help. He has tried Clonazepam, Temazepam, and Sonata, and either these did not work or they caused side effects.    Review of Systems  Constitutional: Negative.   Respiratory: Negative.   Cardiovascular: Positive for leg swelling. Negative for chest pain and palpitations.  Psychiatric/Behavioral: Positive for sleep disturbance.       Objective:   Physical Exam  Constitutional: He is oriented to person, place, and time. He appears well-developed and well-nourished.  Cardiovascular: Normal rate, regular rhythm, normal heart sounds and intact distal pulses.  Pulmonary/Chest: Effort normal and breath sounds normal. No respiratory distress. He has no wheezes. He has no rales.  Neurological: He is alert and oriented to person, place, and time.          Assessment & Plan:  The leg swelling may be a side effect of the Gabapentin, so we agreed he will stop this altogether. Check a BMET today. For the insomnia try Trazadone 50 mg qhs.  [name], MD

## 2017-11-07 DIAGNOSIS — M79672 Pain in left foot: Secondary | ICD-10-CM | POA: Diagnosis not present

## 2017-11-07 DIAGNOSIS — M19071 Primary osteoarthritis, right ankle and foot: Secondary | ICD-10-CM | POA: Diagnosis not present

## 2017-11-07 DIAGNOSIS — M779 Enthesopathy, unspecified: Secondary | ICD-10-CM | POA: Diagnosis not present

## 2017-11-07 DIAGNOSIS — M79671 Pain in right foot: Secondary | ICD-10-CM | POA: Diagnosis not present

## 2017-11-07 DIAGNOSIS — M19072 Primary osteoarthritis, left ankle and foot: Secondary | ICD-10-CM | POA: Diagnosis not present

## 2017-12-17 ENCOUNTER — Telehealth: Payer: Self-pay | Admitting: Family Medicine

## 2017-12-17 DIAGNOSIS — M1612 Unilateral primary osteoarthritis, left hip: Secondary | ICD-10-CM | POA: Diagnosis not present

## 2017-12-17 NOTE — Telephone Encounter (Signed)
Last OV 10/24/2017   Last refilled 09/02/2017 disp 180 with 1 refill   Sent to PCP for approval   Send to [facility]

## 2017-12-17 NOTE — Telephone Encounter (Signed)
Copied from [place] [phone]. Topic: General - Other >> Dec 17, 2017  4:34 PM Cecelia Byars, NT wrote: Reason for CRM: Patients wife called and said he would ike trazodone 50 mg are working [facility] , went in for pain injections and Dr said he will needs a  hip replacement in  December

## 2017-12-18 MED ORDER — TRAZODONE HCL 50 MG PO TABS: 50.0000 mg | ORAL_TABLET | Freq: Every evening | ORAL | 3 refills | Status: DC | PRN

## 2017-12-18 NOTE — Telephone Encounter (Signed)
Called and spoke with pt. Pt advised and voiced understanding.  

## 2017-12-18 NOTE — Telephone Encounter (Signed)
[facility]

## 2017-12-24 NOTE — Progress Notes (Signed)
[name] [name] was seen today in the movement disorders clinic for neurologic consultation at the request of [name], MD.  The consultation is for the evaluation of tremor.  Tremor started 1 year ago, but it has been getting worse since October.  The pt is R hand dominant.  The pt has noted worsening.  At first, it was positional and intermittent but that has changed.  It is more constant now.  He notices it at rest.  He can make it stop for a short period of time by concentrating on it.    11/18/13 update:  Pt presents today for follow up, accompanied by his wife who supplements the history.  She was not present last visit.  Pt states that he is doing better on the Mirapex.  He feels that he is about 98% better.  He denies SE with the medication.  No compulsive behaviors are noted.  Feeling well.  Exercising.  No falls.  No hallucinations.  Tremor feels like it is just under the surface but not visible.  No lightheadedness.    02/17/14 update:  Pt is f/u today regarding PD.  He is feeling great on mirapex er, 1.5 mg daily.  Feels sleepy if he relaxes but isn't able to sleep if he tries.  Pt states that he rarely feels/sees tremor.  Balance has been good.  Exercising faithfully.  Has had several kidney stones and asks if related to mirapex.  No hallucinations.  No lightheadedness.    06/22/14 update:  Pt is f/u today re: PD.  Doing well on mirapex ER 1.5 mg daily.  Good balance.  No falls.  No hallucinations. No compulsive behaviors.  Exercising a lot.  States that most day he wouldn't even know he had PD.   The records that were made available to me were reviewed.  Been on several abx for prostatitis.  Saw that he called pcp for phenergan suppositories but states that he only uses it rarely as he had a nissen fundoplication.  He has only used one.  Had derm appt and no evidence of CA.  10/25/14 update:  Pt is accompanied by his wife who supplements the history.  He states that it is "time" to  replace the shoulder.  He has it scheduled but not until December 1.  He isn't sure if it isn't under general anesthesia.  He was prescribed phenergan but he almost never uses it (twice in three years).  He will have no tremor for days to weeks and then may have some tremor.  He has some EDS when he stops when he sits and relaxes.  States if he tried to take a nap, though, he won't be able to sleep.    He doesn't snore at night.  He does take temazepam for sleep at night.  He notices balance isn't quite as good as it used to be.  He hasn't fallen.   He exercises faithfully.  He asks about DBS therapy.  03/14/15 update:  Pt is f/u today.  He is doing well from a PD standpoint.  He remains on mirapex er, 1.5 mg daily.  He is having his shoulder replaced in December.  He is going to be under general anesthesia for surgery.  He states that he had no tremor from last visit until last week, but thinks that was mostly thinking about this visit.  It actually went away over the last few days.  He notes that pain in  the shoulder and coldness in the grocery store will bring out the tremor.  He states that he had 2 episodes of anxiety with driving in [place] and in [place] that he was unusual as he knows [place] well (lived there for a long time).  Still trouble with sleeping despite temazepam and has to be "careful" with that because it causes him to sleep eat.   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    07/28/15 update:  The patient is following up today.  He remains on pramipexole ER, 1.5 mg daily.  He is doing well in that regard.  Tremor has been minimal.  No falls.  "My balance is perfect."  No hallucinations.  No lightheadedness or near syncope.  He went off temazepam for insomnia (states that was "brutal" and did that b/c he was binge eating at night) and last visit I told him to add melatonin, 3 mg. He tried that and it didn't help.  He is doing some better but  asks me about what medication he can try instead.   I did review records since our last visit.  He had a shoulder replacement on the right on December 8.  He is restricted to no movement for 5 weeks but states that he has had little pain since the surgery.  Exercise has been a little minimal because of this.  Going to Dr. Nelva Bush for sciatica and plans to have injection on the L.  12/05/15 update:  The patient is following up today.  He remains on pramipexole ER, 1.5 mg daily.  He is doing well in that regard.  No falls.  No hallucinations.  No lightheadedness or near syncope.  We had started clonazepam last visit for insomnia, and he states that it made him groggy and he d/c that.   He had a shoulder replacement in December, and when I saw him last, he still was not able to move that.  The patient reports that he has actually recovered nicely from that surgery.  I did review records since our last visit.  Dr. Nelva Bush sent me know that the patient did have an MRI of his lumbar spine that demonstrated mild degenerative changes.  He was given gabapentin, but according to notes, the patient did not want to take that until he talked to me.  The patient felt that side effects were similar to Parkinson's disease, although Dr. Nelva Bush told him that the biggest side effects or legs swelling and cognitive changes.  Pt states that it is keeping him awake most nights and it is worse when he sits down and it radiates down the L leg.  He is still having insomnia.  04/10/16 update:  Pt f/u today, still doing well on mirapex ER, 1.5 mg daily.  No compulsive behaviors.  No sleep attacks.  No falls, hallucinations, near syncope.  States that he really only has tremor when he comes to see me.  Otherwise, he feels that tremor is well controlled.  Continues to exercise.  Last visit, biggest issues was sciatica.  Started on gabapentin for that but didn't find it helpful.  Tried lyrica but also not helpful.  States that he is seeing  chiropractor for it now.  If he lays or sits, it "was hell."  If he moves, it helps.  Pain radiating down the left leg.  Has had MRI of the lumbar spin with Dr. Nelva Bush and only with  degenerative changes.  Thinks that chiropractics have been some help but just started going.  "my balance is better the last year or two than when I was 67 years old."  08/24/16 update:  Pt f/u today, on pramipexole ER, 1.5 mg daily.  Feels well on the medication. Still struggling with sciatica.  Doing Yoga.  Swimming for exercise now.   Pt denies falls.  Pt denies lightheadedness, near syncope.  No hallucinations.  Mood has been good.  No compulsive behaviors or sleep attacks.  Only notes tremor with cold or when sciatica is bad.    11/22/16 update:  Patient seen today in follow-up.  He remains on pramipexole ER, 1.5 mg daily.  Denies sleep attacks.  Denies compulsive behaviors.  Continues to swim for exercise.  He is very active in his yard.  He denies any lightheadedness or near syncope.  He denies hallucinations.  He denies delusions.  His mood has been good.  He denies falls.  He is still having sciatic pain and is back to see Dr. Nelva Bush on 5/30.  He is considering surgical intervention.    04/04/17 update:  Pt f/u today for PD.  On pramipexole ER, 1.5 mg daily.  No compulsive behaviors.  No sleep attacks.  Continues to exercise daily.  The records that were made available to me were reviewed since last visit.  Last saw [name], MD on 01/02/17.  Sciatica was doing great but having issues again.  He gave up swimming and states that "I move rocks instead."  He has been doing lots of work in his yard.  Wearing off:  No.  How long before next dose:  n/a Falls:   No. N/V:  No. Hallucinations:  No.  visual distortions: No. Lightheaded:  No.  Syncope: No. Dyskinesia:  No.  08/08/17 update:  Pt seen in f/u for PD.  He continues to do well.  On pramipexole ER 1.5 mg daily.  Pt denies falls.  Pt denies lightheadedness, near  syncope.  No hallucinations.  Mood has been good.  The records that were made available to me were reviewed.  Mom died from mouth CA since our last visit.  Still having issues with his sciatic pain and now going to accupuncture and thinks that it helps.  Asks about sleep meds   12/25/17 update: Patient is seen today in follow-up for Parkinson's disease.  He remains on pramipexole ER, 1.5 mg daily.  Pt denies falls.  Pt denies lightheadedness, near syncope.  No hallucinations.  Mood has been good.  Has continued sciatica, which is chronic.  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  Saw Dr. Nelva Bush and dx with degenerative arthritis of the left hip and awaiting sx consult with Dr. Berenice Primas for left THA.  He has been able to stay active in the yard and brings pics of the yard and pics of him jumping from cliff into water when on vacation.    PREVIOUS MEDICATIONS: klonopin for insomnia made groggy  ALLERGIES:   Allergies  Allergen Reactions  . Penicillins Rash    CURRENT MEDICATIONS:  Current Outpatient Medications on File Prior to Visit  Medication Sig Dispense Refill  . atorvastatin (LIPITOR) 20 MG tablet TAKE 1 TABLET DAILY 90 tablet 2  . finasteride (PROSCAR) 5 MG tablet Take 5 mg by mouth daily.    . hydrochlorothiazide (MICROZIDE) 12.5 MG capsule TAKE 1 CAPSULE DAILY  90 capsule 2  . ibuprofen (ADVIL,MOTRIN) 800 MG tablet Take 1 tablet (800 mg total) by mouth every 8 (eight) hours as needed for moderate pain. 270 tablet 3  . ibuprofen (ADVIL,MOTRIN) 800 MG tablet ibuprofen 800 mg tablet    . polyethylene glycol (MIRALAX / GLYCOLAX) packet Take 17 g by mouth daily.    . Pramipexole Dihydrochloride (MIRAPEX ER) 1.5 MG TB24 Take 1 tablet (1.5 mg total) by mouth daily. 90 tablet 1  . tamsulosin (FLOMAX) 0.4 MG CAPS capsule TAKE 1 CAPSULE DAILY 90 capsule 2  . traMADol (ULTRAM) 50 MG tablet Take 2 tablets (100 mg  total) by mouth at bedtime. 180 tablet 1  . traZODone (DESYREL) 50 MG tablet Take 1 tablet (50 mg total) by mouth at bedtime as needed for sleep. 90 tablet 3   Current Facility-Administered Medications on File Prior to Visit  Medication Dose Route Frequency Provider Last Rate Last Dose  . 0.9 %  sodium chloride infusion  500 mL Intravenous Continuous Milus Banister, MD        PAST MEDICAL HISTORY:   Past Medical History:  Diagnosis Date  . Arthritis   . Barrett's esophagus    sees Dr. Delfin Edis   . BPH with urinary obstruction   . Complication of anesthesia    Urinary retention. "fights it"- is what he was told. 1990's - vomitted and aspirated in surgery  . Diverticulosis   . Esophageal dysmotility   . GERD (gastroesophageal reflux disease)    not since [facility] Fund[facility]  . History of hiatal hernia   . History of kidney stones    15 in 2015- last one was in Oct 2015- takes HCTZ to help prevent  . Hyperlipidemia   . Insomnia   . Lyme disease 2013  . Parkinson's disease [facility])    sees Dr. Wells Guiles Tat   . RMSF [facility] spotted fever) 2012    PAST SURGICAL HISTORY:   Past Surgical History:  Procedure Laterality Date  . CHOLECYSTECTOMY    . COLONOSCOPY  06-24-13   per Dr. Olevia Perches, no polyps, diveticulosis only, repeat in 10 yrs   . ESOPHAGOGASTRODUODENOSCOPY  06-24-13   per Dr. Olevia Perches, no signs of Barretts, repeat in 5 yrs   . HERNIA REPAIR Right    right inguinal  . LASIK     bilateral  . lipomas removed     x10/ on arms and legs  . NISSEN FUNDOPLICATION  [birth year]42[birth year]   laparoscopic per Dr. Johnathan Hausen  . ORCHIOPEXY     right side at age 61   . TONSILLECTOMY AND ADENOIDECTOMY    . TOTAL SHOULDER ARTHROPLASTY Right 06/23/2015   Procedure: RIGHT TOTAL SHOULDER ARTHROPLASTY;  Surgeon: Justice Britain, MD;  Location: [facility];  Service: Orthopedics;  Laterality: Right;    SOCIAL HISTORY:   Social History   Socioeconomic History  . Marital status: Married     Spouse name: Not on file  . Number of children: Not on file  . Years of education: Not on file  . Highest education level: Not on file  Occupational History  . Not on file  Social Needs  . Financial resource strain: Not on file  . Food insecurity:    Worry: Not on file    Inability: Not on file  . Transportation needs:    Medical: Not on file    Non-medical: Not on file  Tobacco Use  . Smoking status: Never Smoker  . Smokeless tobacco: Never Used  Substance and Sexual Activity  . Alcohol use: No    Alcohol/week: 0.0 oz  . Drug use: No  . Sexual activity: Not on file  Lifestyle  . Physical activity:    Days per week: Not on file    Minutes per session: Not on file  . Stress: Not on file  Relationships  . Social connections:    Talks on phone: Not on file    Gets together: Not on file    Attends religious service: Not on file    Active member of club or organization: Not on file    Attends meetings of clubs or organizations: Not on file    Relationship status: Not on file  . Intimate partner violence:    Fear of current or ex partner: Not on file    Emotionally abused: Not on file    Physically abused: Not on file    Forced sexual activity: Not on file  Other Topics Concern  . Not on file  Social History Narrative  . Not on file    FAMILY HISTORY:   Family Status  Relation Name Status  . Mother  Alive       rheumatoid arthritis  . Father  Deceased at age 42       esophageal cancer  . MGF  Deceased       esophagel cancer  . PGM  Deceased  . PGF  Deceased  . Sister  Alive  . Brother  Alive  . Daughter  Alive  . MGM  Deceased  . Sister  Alive  . Sister  Deceased  . Sister  Alive  . Daughter  Alive  . Ethlyn Daniels  (Not Specified)  . Cousin  (Not Specified)  . Neg Hx  (Not Specified)    ROS:  A complete 10 system review of systems was obtained and was unremarkable apart from what is mentioned above.  PHYSICAL EXAMINATION:    VITALS:   Vitals:    12/25/17 1245  BP: (!) 148/82  Pulse: 91  SpO2: 98%  Weight: 210 lb (95.3 kg)  Height: 5\' 11"  (1.803 m)   GEN:  The patient appears stated age and is in NAD. HEENT:  Normocephalic, atraumatic.  The mucous membranes are moist. The superficial temporal arteries are without ropiness or tenderness. CV:  RRR Lungs:  CTAB Neck/HEME:  There are no carotid bruits bilaterally.  Neurological examination:  Orientation: The patient is alert and oriented x3. Cranial nerves: There is good facial symmetry. The speech is fluent and clear. Soft palate rises symmetrically and there is no tongue deviation. Hearing is intact to conversational tone. Sensation: Sensation is intact to light touch throughout Motor: Strength is 5/5 in the bilateral upper and lower extremities.   Shoulder shrug is equal and symmetric.  There is no pronator drift.  Movement examination: Tone: There is minimal increased tone in the RUE Abnormal movements: Rare tremor in the R hand Coordination:  There is minor decrease in finger taps on the right.   Gait and Station: The patient has no difficulty arising out of a deep-seated chair without the use of the hands. The patient's stride length is normal with decreased arm swing on the right.  LABS  Lab Results  Component Value Date   TSH 2.64 09/02/2017     Chemistry      Component Value Date/Time   NA 140 10/24/2017 1100   K 3.5 10/24/2017 1100   CL 101 10/24/2017 1100  CO2 31 10/24/2017 1100   BUN 15 10/24/2017 1100   CREATININE 0.79 10/24/2017 1100      Component Value Date/Time   CALCIUM 9.4 10/24/2017 1100   ALKPHOS 68 09/02/2017 1203   AST 25 09/02/2017 1203   ALT 29 09/02/2017 1203   BILITOT 2.9 (H) 09/02/2017 1203     Lab Results  Component Value Date   PSA 0.80 09/18/2016   PSA 4.70 (H) 08/23/2016   PSA 0.68 05/27/2015     ASSESSMENT/PLAN:  1. Parkinsons disease  -continue pramipexole ER, 1.5 mg daily.  Risks, benefits, side effects and  alternative therapies were discussed.  The opportunity to ask questions was given and they were answered to the best of my ability.  The patient expressed understanding and willingness to follow the outlined treatment protocols.  -talked about avoiding ladders  -safety discussed 2.  L hip pain  -awaiting consult for possible L THA  3.  Follow up is anticipated in the next few months, sooner should new neurologic issues arise.

## 2017-12-25 ENCOUNTER — Other Ambulatory Visit: Payer: Self-pay

## 2017-12-25 ENCOUNTER — Ambulatory Visit (INDEPENDENT_AMBULATORY_CARE_PROVIDER_SITE_OTHER): Payer: Medicare Other | Admitting: Neurology

## 2017-12-25 ENCOUNTER — Encounter: Payer: Self-pay | Admitting: Neurology

## 2017-12-25 VITALS — BP 148/82 | HR 91 | Ht 71.0 in | Wt 210.0 lb

## 2017-12-25 DIAGNOSIS — M1612 Unilateral primary osteoarthritis, left hip: Secondary | ICD-10-CM

## 2017-12-25 DIAGNOSIS — G20 Parkinson's disease: Secondary | ICD-10-CM | POA: Diagnosis not present

## 2017-12-26 ENCOUNTER — Ambulatory Visit: Payer: Medicare Other | Admitting: Neurology

## 2017-12-31 DIAGNOSIS — M1612 Unilateral primary osteoarthritis, left hip: Secondary | ICD-10-CM | POA: Diagnosis not present

## 2018-02-07 DIAGNOSIS — D3132 Benign neoplasm of left choroid: Secondary | ICD-10-CM | POA: Diagnosis not present

## 2018-02-07 DIAGNOSIS — H2513 Age-related nuclear cataract, bilateral: Secondary | ICD-10-CM | POA: Diagnosis not present

## 2018-02-14 DIAGNOSIS — Z87442 Personal history of urinary calculi: Secondary | ICD-10-CM | POA: Diagnosis not present

## 2018-02-14 DIAGNOSIS — R972 Elevated prostate specific antigen [PSA]: Secondary | ICD-10-CM | POA: Diagnosis not present

## 2018-02-14 DIAGNOSIS — R351 Nocturia: Secondary | ICD-10-CM | POA: Diagnosis not present

## 2018-02-14 DIAGNOSIS — N401 Enlarged prostate with lower urinary tract symptoms: Secondary | ICD-10-CM | POA: Diagnosis not present

## 2018-03-24 ENCOUNTER — Other Ambulatory Visit: Payer: Self-pay | Admitting: Neurology

## 2018-04-10 ENCOUNTER — Telehealth: Payer: Self-pay | Admitting: Family Medicine

## 2018-04-10 NOTE — Telephone Encounter (Signed)
Copied from [place] [id number]. Topic: Quick Communication - See Telephone Encounter >> Apr 10, 2018  3:22 PM Hewitt Shorts wrote: Pt is needing a refill on tramadol   [facility]  Best number  [phone]

## 2018-04-10 NOTE — Telephone Encounter (Signed)
Tramadol refill Last Refill:09/02/17 # 90 Last OV: 09/02/17 PCP: Sarajane Jews Pharmacy:[facility]

## 2018-04-14 NOTE — Telephone Encounter (Signed)
Dr. Fry please advise on refill. Thanks  

## 2018-04-15 NOTE — Telephone Encounter (Signed)
Call in #[phone] with one rf  

## 2018-04-16 MED ORDER — FINASTERIDE 5 MG PO TABS: 5.0000 mg | ORAL_TABLET | Freq: Every day | ORAL | 3 refills | Status: DC

## 2018-04-16 MED ORDER — TRAMADOL HCL 50 MG PO TABS: 100.0000 mg | ORAL_TABLET | Freq: Every day | ORAL | 1 refills | Status: DC

## 2018-04-16 NOTE — Telephone Encounter (Signed)
Refills have been printed out for Dr. Sarajane Jews to sign and these will [facility]

## 2018-04-17 NOTE — Telephone Encounter (Signed)
Copied from [place] [id number]. Topic: Quick Communication - Rx Refill/Question >> Apr 17, 2018  4:02 PM Tye [place] wrote: Medication: traMADol (ULTRAM) 50 MG tablet [[id number]]  finasteride (PROSCAR) 5 MG tablet [[id number]]    Has the patient contacted their pharmacy? Yes.   (Agent: If no, request that the patient contact the pharmacy for the refill.) (Agent: If yes, when and what did the pharmacy advise?)  Preferred Pharm[facility] (with phone number [facility]  Agent: Please be advised that RX refills may take up to 3 business days. We ask that you follow-up with your pharmacy.

## 2018-04-18 MED ORDER — TRAMADOL HCL 50 MG PO TABS: 100.0000 mg | ORAL_TABLET | Freq: Every day | ORAL | 1 refills | Status: DC

## 2018-04-19 NOTE — Telephone Encounter (Signed)
Medication has already been sent Iin to his pharmacy.

## 2018-04-21 DIAGNOSIS — M25552 Pain in left hip: Secondary | ICD-10-CM | POA: Diagnosis not present

## 2018-04-21 DIAGNOSIS — M1612 Unilateral primary osteoarthritis, left hip: Secondary | ICD-10-CM | POA: Diagnosis not present

## 2018-05-14 ENCOUNTER — Other Ambulatory Visit: Payer: Self-pay | Admitting: Orthopedic Surgery

## 2018-05-16 NOTE — Progress Notes (Signed)
[name] [name] was seen today in the movement disorders clinic for neurologic consultation at the request of [name], MD.  The consultation is for the evaluation of tremor.  Tremor started 1 year ago, but it has been getting worse since October.  The pt is R hand dominant.  The pt has noted worsening.  At first, it was positional and intermittent but that has changed.  It is more constant now.  He notices it at rest.  He can make it stop for a short period of time by concentrating on it.    11/18/13 update:  Pt presents today for follow up, accompanied by his wife who supplements the history.  She was not present last visit.  Pt states that he is doing better on the Mirapex.  He feels that he is about 98% better.  He denies SE with the medication.  No compulsive behaviors are noted.  Feeling well.  Exercising.  No falls.  No hallucinations.  Tremor feels like it is just under the surface but not visible.  No lightheadedness.    02/17/14 update:  Pt is f/u today regarding PD.  He is feeling great on mirapex er, 1.5 mg daily.  Feels sleepy if he relaxes but isn't able to sleep if he tries.  Pt states that he rarely feels/sees tremor.  Balance has been good.  Exercising faithfully.  Has had several kidney stones and asks if related to mirapex.  No hallucinations.  No lightheadedness.    06/22/14 update:  Pt is f/u today re: PD.  Doing well on mirapex ER 1.5 mg daily.  Good balance.  No falls.  No hallucinations. No compulsive behaviors.  Exercising a lot.  States that most day he wouldn't even know he had PD.   The records that were made available to me were reviewed.  Been on several abx for prostatitis.  Saw that he called pcp for phenergan suppositories but states that he only uses it rarely as he had a nissen fundoplication.  He has only used one.  Had derm appt and no evidence of CA.  10/25/14 update:  Pt is accompanied by his wife who supplements the history.  He states that it is "time" to  replace the shoulder.  He has it scheduled but not until December 1.  He isn't sure if it isn't under general anesthesia.  He was prescribed phenergan but he almost never uses it (twice in three years).  He will have no tremor for days to weeks and then may have some tremor.  He has some EDS when he stops when he sits and relaxes.  States if he tried to take a nap, though, he won't be able to sleep.    He doesn't snore at night.  He does take temazepam for sleep at night.  He notices balance isn't quite as good as it used to be.  He hasn't fallen.   He exercises faithfully.  He asks about DBS therapy.  03/14/15 update:  Pt is f/u today.  He is doing well from a PD standpoint.  He remains on mirapex er, 1.5 mg daily.  He is having his shoulder replaced in December.  He is going to be under general anesthesia for surgery.  He states that he had no tremor from last visit until last week, but thinks that was mostly thinking about this visit.  It actually went away over the last few days.  He notes that pain in  the shoulder and coldness in the grocery store will bring out the tremor.  He states that he had 2 episodes of anxiety with driving in [place] and in [place] that he was unusual as he knows [place] well (lived there for a long time).  Still trouble with sleeping despite temazepam and has to be "careful" with that because it causes him to sleep eat.   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    07/28/15 update:  The patient is following up today.  He remains on pramipexole ER, 1.5 mg daily.  He is doing well in that regard.  Tremor has been minimal.  No falls.  "My balance is perfect."  No hallucinations.  No lightheadedness or near syncope.  He went off temazepam for insomnia (states that was "brutal" and did that b/c he was binge eating at night) and last visit I told him to add melatonin, 3 mg. He tried that and it didn't help.  He is doing some better but  asks me about what medication he can try instead.   I did review records since our last visit.  He had a shoulder replacement on the right on December 8.  He is restricted to no movement for 5 weeks but states that he has had little pain since the surgery.  Exercise has been a little minimal because of this.  Going to Dr. Nelva Bush for sciatica and plans to have injection on the L.  12/05/15 update:  The patient is following up today.  He remains on pramipexole ER, 1.5 mg daily.  He is doing well in that regard.  No falls.  No hallucinations.  No lightheadedness or near syncope.  We had started clonazepam last visit for insomnia, and he states that it made him groggy and he d/c that.   He had a shoulder replacement in December, and when I saw him last, he still was not able to move that.  The patient reports that he has actually recovered nicely from that surgery.  I did review records since our last visit.  Dr. Nelva Bush sent me know that the patient did have an MRI of his lumbar spine that demonstrated mild degenerative changes.  He was given gabapentin, but according to notes, the patient did not want to take that until he talked to me.  The patient felt that side effects were similar to Parkinson's disease, although Dr. Nelva Bush told him that the biggest side effects or legs swelling and cognitive changes.  Pt states that it is keeping him awake most nights and it is worse when he sits down and it radiates down the L leg.  He is still having insomnia.  04/10/16 update:  Pt f/u today, still doing well on mirapex ER, 1.5 mg daily.  No compulsive behaviors.  No sleep attacks.  No falls, hallucinations, near syncope.  States that he really only has tremor when he comes to see me.  Otherwise, he feels that tremor is well controlled.  Continues to exercise.  Last visit, biggest issues was sciatica.  Started on gabapentin for that but didn't find it helpful.  Tried lyrica but also not helpful.  States that he is seeing  chiropractor for it now.  If he lays or sits, it "was hell."  If he moves, it helps.  Pain radiating down the left leg.  Has had MRI of the lumbar spin with Dr. Nelva Bush and only with  degenerative changes.  Thinks that chiropractics have been some help but just started going.  "my balance is better the last year or two than when I was 67 years old."  08/24/16 update:  Pt f/u today, on pramipexole ER, 1.5 mg daily.  Feels well on the medication. Still struggling with sciatica.  Doing Yoga.  Swimming for exercise now.   Pt denies falls.  Pt denies lightheadedness, near syncope.  No hallucinations.  Mood has been good.  No compulsive behaviors or sleep attacks.  Only notes tremor with cold or when sciatica is bad.    11/22/16 update:  Patient seen today in follow-up.  He remains on pramipexole ER, 1.5 mg daily.  Denies sleep attacks.  Denies compulsive behaviors.  Continues to swim for exercise.  He is very active in his yard.  He denies any lightheadedness or near syncope.  He denies hallucinations.  He denies delusions.  His mood has been good.  He denies falls.  He is still having sciatic pain and is back to see Dr. Nelva Bush on 5/30.  He is considering surgical intervention.    04/04/17 update:  Pt f/u today for PD.  On pramipexole ER, 1.5 mg daily.  No compulsive behaviors.  No sleep attacks.  Continues to exercise daily.  The records that were made available to me were reviewed since last visit.  Last saw [name], MD on 01/02/17.  Sciatica was doing great but having issues again.  He gave up swimming and states that "I move rocks instead."  He has been doing lots of work in his yard.  Wearing off:  No.  How long before next dose:  n/a Falls:   No. N/V:  No. Hallucinations:  No.  visual distortions: No. Lightheaded:  No.  Syncope: No. Dyskinesia:  No.  08/08/17 update:  Pt seen in f/u for PD.  He continues to do well.  On pramipexole ER 1.5 mg daily.  Pt denies falls.  Pt denies lightheadedness, near  syncope.  No hallucinations.  Mood has been good.  The records that were made available to me were reviewed.  Mom died from mouth CA since our last visit.  Still having issues with his sciatic pain and now going to accupuncture and thinks that it helps.  Asks about sleep meds   12/25/17 update: Patient is seen today in follow-up for Parkinson's disease.  He remains on pramipexole ER, 1.5 mg daily.  Pt denies falls.  Pt denies lightheadedness, near syncope.  No hallucinations.  Mood has been good.  Has continued sciatica, which is chronic.  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  Saw Dr. Nelva Bush and dx with degenerative arthritis of the left hip and awaiting sx consult with Dr. Berenice Primas for left THA.  He has been able to stay active in the yard and brings pics of the yard and pics of him jumping from cliff into water when on vacation.    05/16/18 update: Patient seen today in follow-up for Parkinson's disease.  He is on pramipexole ER, 1.5 mg daily.  No falls.  No lightheadedness or near syncope.  Patient is scheduled for hip replacement on November 11.  He thinks that some of his pain that he was previously attributing to sciatica was degenerative arthritis of the hip.  He brings in a handicap placard for me to fill out.  PREVIOUS MEDICATIONS: klonopin for insomnia made  groggy  ALLERGIES:   Allergies  Allergen Reactions  . Penicillins Rash and Other (See Comments)    Has patient had a PCN reaction causing immediate rash, facial/tongue/throat swelling, SOB or lightheadedness with hypotension: No Has patient had a PCN reaction causing severe rash involving mucus membranes or skin necrosis: No Has patient had a PCN reaction that required hospitalization: No Has patient had a PCN reaction occurring within the last 10 years: No If all of the above answers are "NO", then may proceed with Cephalosporin use.     CURRENT  MEDICATIONS:  Current Outpatient Medications on File Prior to Visit  Medication Sig Dispense Refill  . atorvastatin (LIPITOR) 20 MG tablet TAKE 1 TABLET DAILY (Patient taking differently: Take 20 mg by mouth daily. ) 90 tablet 2  . finasteride (PROSCAR) 5 MG tablet Take 1 tablet (5 mg total) by mouth daily. 90 tablet 3  . hydrochlorothiazide (MICROZIDE) 12.5 MG capsule TAKE 1 CAPSULE DAILY (Patient taking differently: Take 12.5 mg by mouth daily. ) 90 capsule 2  . ibuprofen (ADVIL,MOTRIN) 800 MG tablet Take 1 tablet (800 mg total) by mouth every 8 (eight) hours as needed for moderate pain. 270 tablet 3  . MIRAPEX ER 1.5 MG TB24 TAKE 1 TABLET DAILY (Patient taking differently: Take 1.5 mg by mouth daily. ) 90 tablet 1  . polyethylene glycol (MIRALAX / GLYCOLAX) packet Take 17 g by mouth daily as needed for moderate constipation.     . tamsulosin (FLOMAX) 0.4 MG CAPS capsule TAKE 1 CAPSULE DAILY (Patient taking differently: Take 0.4 mg by mouth daily. ) 90 capsule 2  . traMADol (ULTRAM) 50 MG tablet Take 2 tablets (100 mg total) by mouth at bedtime. (Patient taking differently: Take 100 mg by mouth at bedtime as needed for moderate pain. ) 180 tablet 1  . traZODone (DESYREL) 50 MG tablet Take 1 tablet (50 mg total) by mouth at bedtime as needed for sleep. 90 tablet 3   No current facility-administered medications on file prior to visit.     PAST MEDICAL HISTORY:   Past Medical History:  Diagnosis Date  . Arthritis   . Barrett's esophagus    sees Dr. Delfin Edis   . BPH with urinary obstruction   . Complication of anesthesia    Urinary retention. "fights it"- is what he was told. 1990's - vomitted and aspirated in surgery  . Diverticulosis   . Esophageal dysmotility   . GERD (gastroesophageal reflux disease)    not since [facility]  . History of hiatal hernia   . History of kidney stones    15 in 2015- last one was in Oct 2015- takes HCTZ to help prevent  . Hyperlipidemia   .  Insomnia   . Lyme disease 2013  . Parkinson's disease [facility])    sees Dr. Wells Guiles Jamiere Gulas   . [facility] spotted fever) 2012    PAST SURGICAL HISTORY:   Past Surgical History:  Procedure Laterality Date  . CHOLECYSTECTOMY    . COLONOSCOPY  06-24-13   per Dr. Olevia Perches, no polyps, diveticulosis only, repeat in 10 yrs   . ESOPHAGOGASTRODUODENOSCOPY  06-24-13   per Dr. Olevia Perches, no signs of Barretts, repeat in 5 yrs   . HERNIA REPAIR Right    right inguinal  . LASIK     bilateral  . lipomas removed     x10/ on arms and legs  . NISSEN FUNDOPLICATION  11-09-04   laparoscopic per Dr. Johnathan Hausen  .  ORCHIOPEXY     right side at age 89   . TONSILLECTOMY AND ADENOIDECTOMY    . TOTAL SHOULDER ARTHROPLASTY Right 06/23/2015   Procedure: RIGHT TOTAL SHOULDER ARTHROPLASTY;  Surgeon: Justice Britain, MD;  Location: [facility];  Service: Orthopedics;  Laterality: Right;    SOCIAL HISTORY:   Social History   Socioeconomic History  . Marital status: Married    Spouse name: Not on file  . Number of children: Not on file  . Years of education: Not on file  . Highest education level: Not on file  Occupational History  . Not on file  Social Needs  . Financial resource strain: Not on file  . Food insecurity:    Worry: Not on file    Inability: Not on file  . Transportation needs:    Medical: Not on file    Non-medical: Not on file  Tobacco Use  . Smoking status: Never Smoker  . Smokeless tobacco: Never Used  Substance and Sexual Activity  . Alcohol use: No    Alcohol/week: 0.0 standard drinks  . Drug use: No  . Sexual activity: Not on file  Lifestyle  . Physical activity:    Days per week: Not on file    Minutes per session: Not on file  . Stress: Not on file  Relationships  . Social connections:    Talks on phone: Not on file    Gets together: Not on file    Attends religious service: Not on file    Active member of club or organization: Not on file    Attends meetings of clubs  or organizations: Not on file    Relationship status: Not on file  . Intimate partner violence:    Fear of current or ex partner: Not on file    Emotionally abused: Not on file    Physically abused: Not on file    Forced sexual activity: Not on file  Other Topics Concern  . Not on file  Social History Narrative  . Not on file    FAMILY HISTORY:   Family Status  Relation Name Status  . Mother  Alive       rheumatoid arthritis  . Father  Deceased at age 25       esophageal cancer  . MGF  Deceased       esophagel cancer  . PGM  Deceased  . PGF  Deceased  . Sister  Alive  . Brother  Alive  . Daughter  Alive  . MGM  Deceased  . Sister  Alive  . Sister  Deceased  . Sister  Alive  . Daughter  Alive  . Ethlyn Daniels  (Not Specified)  . Cousin  (Not Specified)  . Neg Hx  (Not Specified)    ROS:  Review of Systems  Constitutional: Negative.   HENT: Negative.   Eyes: Negative.   Cardiovascular: Negative.   Gastrointestinal: Negative.   Genitourinary: Negative.   Musculoskeletal: Positive for joint pain (L hip; feet; hands).  Skin: Negative.      PHYSICAL EXAMINATION:    VITALS:   Vitals:   05/19/18 1343  BP: 132/84  Pulse: 94  SpO2: 97%  Weight: 214 lb (97.1 kg)  Height: 5' 10.5" (1.791 m)   Wt Readings from Last 3 Encounters:  05/19/18 214 lb (97.1 kg)  12/25/17 210 lb (95.3 kg)  10/24/17 210 lb 12.8 oz (95.6 kg)      GEN:  The patient appears stated age  and is in NAD. HEENT:  Normocephalic, atraumatic.  The mucous membranes are moist. The superficial temporal arteries are without ropiness or tenderness. CV:  RRR Lungs:  CTAB Neck/HEME:  There are no carotid bruits bilaterally.  Neurological examination:  Orientation: The patient is alert and oriented x3. Cranial nerves: There is good facial symmetry. The speech is fluent and clear. Soft palate rises symmetrically and there is no tongue deviation. Hearing is intact to conversational tone. Sensation:  Sensation is intact to light touch throughout Motor: Strength is 5/5 in the bilateral upper and lower extremities.   Shoulder shrug is equal and symmetric.  There is no pronator drift.   Movement examination: Tone: There is mild increased tone in the RUE with activation only Abnormal movements: no tremor today Coordination:  There is minor decrease in finger taps on the right and mild trouble with hand opening and closing on the right Gait and Station: The patient has no difficulty arising out of a deep-seated chair without the use of the hands. The patient's stride length is normal with decreased arm swing on the right.  He is slightly antalgic.    LABS  Lab Results  Component Value Date   TSH 2.64 09/02/2017     Chemistry      Component Value Date/Time   NA 140 10/24/2017 1100   K 3.5 10/24/2017 1100   CL 101 10/24/2017 1100   CO2 31 10/24/2017 1100   BUN 15 10/24/2017 1100   CREATININE 0.79 10/24/2017 1100      Component Value Date/Time   CALCIUM 9.4 10/24/2017 1100   ALKPHOS 68 09/02/2017 1203   AST 25 09/02/2017 1203   ALT 29 09/02/2017 1203   BILITOT 2.9 (H) 09/02/2017 1203     Lab Results  Component Value Date   PSA 0.80 09/18/2016   PSA 4.70 (H) 08/23/2016   PSA 0.68 05/27/2015     ASSESSMENT/PLAN:  1. Parkinsons disease  -continue pramipexole ER, 1.5 mg daily.  Risks, benefits, side effects and alternative therapies were discussed.  The opportunity to ask questions was given and they were answered to the best of my ability.  The patient expressed understanding and willingness to follow the outlined treatment protocols.  -handicap placard filled out 2.  L hip pain  -having L THA on 11/11 3.  Follow up is anticipated in the next 5 months, sooner should new neurologic issues arise.

## 2018-05-19 ENCOUNTER — Encounter: Payer: Self-pay | Admitting: Neurology

## 2018-05-19 ENCOUNTER — Ambulatory Visit (INDEPENDENT_AMBULATORY_CARE_PROVIDER_SITE_OTHER): Payer: Medicare Other | Admitting: Neurology

## 2018-05-19 VITALS — BP 132/84 | HR 94 | Ht 70.5 in | Wt 214.0 lb

## 2018-05-19 DIAGNOSIS — G20 Parkinson's disease: Secondary | ICD-10-CM | POA: Diagnosis not present

## 2018-05-19 NOTE — Pre-Procedure Instructions (Signed)
[name] [name]  05/19/2018      [id number] - [place], [address] AT [address] [place] [address] Phone: [phone] Fax: [phone]  [facility] [phone] - [place], [address] AT [address] [place] [address] Phone: [phone] Fax: [phone]    Your procedure is scheduled on 05/26/18.  Report to [facility] Admitting at 10 A.M.  Call this number if you have problems the morning of surgery:  [phone]   Remember:  Do not eat or drink after midnight.  Yo   Take these medicines the morning of surgery with A SIP OF WATER ---proscar,flomax,ultram    Do not wear jewelry, make-up or nail polish.  Do not wear lotions, powders, or perfumes, or deodorant.  Do not shave 48 hours prior to surgery.  Men may shave face and neck.  Do not bring valuables to the hospital.  [facility] is not responsible for any belongings or valuables.  Contacts, dentures or bridgework may not be worn into surgery.  Leave your suitcase in the car.  After surgery it may be brought to your room.  For patients admitted to the hospital, discharge time will be determined by your treatment team.  Patients discharged the day of surgery will not be allowed to drive home.   Name and phone number of your driver: Do not take any aspirin,anti-inflammatories,vitamins,or herbal supplements 5-7 days prior to surgery.  Please read over the following fact sheets that you were given. MRSA Information [place] - Preparing for Surgery  Before surgery, you can play an important role.  Because skin is not sterile, your skin needs to be as free of germs as possible.  You can reduce the number of germs on you skin by washing with CHG (chlorahexidine gluconate) soap before surgery.  CHG is an antiseptic cleaner which kills germs and bonds with the skin to continue  killing germs even after washing.  Oral Hygiene is also important in reducing the risk of infection.  Remember to brush your teeth with your regular toothpaste the morning of surgery.  Please DO NOT use if you have an allergy to CHG or antibacterial soaps.  If your skin becomes reddened/irritated stop using the CHG and inform your nurse when you arrive at Short Stay.  Do not shave (including legs and underarms) for at least 48 hours prior to the first CHG shower.  You may shave your face.  Please follow these instructions carefully:   1.  Shower with CHG Soap the night before surgery and the morning of Surgery.  2.  If you choose to wash your hair, wash your hair first as usual with your normal shampoo.  3.  After you shampoo, rinse your hair and body thoroughly to remove the shampoo. 4.  Use CHG as you would any other liquid soap.  You can apply chg directly to the skin and wash gently with a      scrungie or washcloth.           5.  Apply the CHG Soap to your body ONLY FROM THE NECK DOWN.   Do not use on open wounds or open sores. Avoid contact with your eyes, ears, mouth and genitals (private parts).  Wash genitals (private parts) with your normal soap.  6.  Wash thoroughly, paying special  attention to the area where your surgery will be performed.  7.  Thoroughly rinse your body with warm water from the neck down.  8.  DO NOT shower/wash with your normal soap after using and rinsing off the CHG Soap.  9.  Pat yourself dry with a clean towel.            10.  Wear clean pajamas.            11.  Place clean sheets on your bed the night of your first shower and do not sleep with pets.  Day of Surgery  Do not apply any lotions/deoderants the morning of surgery.   Please wear clean clothes to the hospital/surgery center. Remember to brush your teeth with toothpaste.

## 2018-05-20 ENCOUNTER — Other Ambulatory Visit: Payer: Self-pay

## 2018-05-20 ENCOUNTER — Encounter (HOSPITAL_COMMUNITY): Payer: Self-pay

## 2018-05-20 ENCOUNTER — Ambulatory Visit (HOSPITAL_COMMUNITY)
Admission: RE | Admit: 2018-05-20 | Discharge: 2018-05-20 | Disposition: A | Discharge status: Home / Self Care | Payer: Medicare Other | Source: Ambulatory Visit | Attending: Orthopedic Surgery | Admitting: Orthopedic Surgery

## 2018-05-20 ENCOUNTER — Encounter (HOSPITAL_COMMUNITY)
Admission: RE | Admit: 2018-05-20 | Discharge: 2018-05-20 | Disposition: A | Discharge status: Home / Self Care | Payer: Medicare Other | Source: Ambulatory Visit | Attending: Orthopedic Surgery | Admitting: Orthopedic Surgery

## 2018-05-20 DIAGNOSIS — Z01818 Encounter for other preprocedural examination: Secondary | ICD-10-CM | POA: Insufficient documentation

## 2018-05-20 DIAGNOSIS — J9811 Atelectasis: Secondary | ICD-10-CM | POA: Insufficient documentation

## 2018-05-20 LAB — CBC WITH DIFFERENTIAL/PLATELET
Abs Immature Granulocytes: 0.03 10*3/uL (ref 0.00–0.07)
Basophils Absolute: 0.1 10*3/uL (ref 0.0–0.1)
Basophils Relative: 1 %
EOS ABS: 0.1 10*3/uL (ref 0.0–0.5)
EOS PCT: 1 %
HEMATOCRIT: 43.4 % (ref 39.0–52.0)
HEMOGLOBIN: 15.3 g/dL (ref 13.0–17.0)
Immature Granulocytes: 0 %
LYMPHS ABS: 1.5 10*3/uL (ref 0.7–4.0)
Lymphocytes Relative: 14 %
MCH: 30.9 pg (ref 26.0–34.0)
MCHC: 35.3 g/dL (ref 30.0–36.0)
MCV: 87.7 fL (ref 80.0–100.0)
MONO ABS: 0.7 10*3/uL (ref 0.1–1.0)
MONOS PCT: 6 %
Neutro Abs: 8.6 10*3/uL — ABNORMAL HIGH (ref 1.7–7.7)
Neutrophils Relative %: 78 %
Platelets: 186 10*3/uL (ref 150–400)
RBC: 4.95 MIL/uL (ref 4.22–5.81)
RDW: 12.4 % (ref 11.5–15.5)
WBC: 11.1 10*3/uL — ABNORMAL HIGH (ref 4.0–10.5)
nRBC: 0 % (ref 0.0–0.2)

## 2018-05-20 LAB — COMPREHENSIVE METABOLIC PANEL
ALBUMIN: 4 g/dL (ref 3.5–5.0)
ALK PHOS: 85 U/L (ref 38–126)
ALT: 27 U/L (ref 0–44)
AST: 30 U/L (ref 15–41)
Anion gap: 9 (ref 5–15)
BILIRUBIN TOTAL: 2.1 mg/dL — AB (ref 0.3–1.2)
BUN: 11 mg/dL (ref 8–23)
CALCIUM: 9.3 mg/dL (ref 8.9–10.3)
CO2: 27 mmol/L (ref 22–32)
Chloride: 102 mmol/L (ref 98–111)
Creatinine, Ser: 0.77 mg/dL (ref 0.61–1.24)
GFR calc Af Amer: >60 mL/min (ref >60)
GLUCOSE: 108 mg/dL — AB (ref 70–99)
POTASSIUM: 3.4 mmol/L — AB (ref 3.5–5.1)
Sodium: 138 mmol/L (ref 135–145)
TOTAL PROTEIN: 6.6 g/dL (ref 6.5–8.1)

## 2018-05-20 LAB — URINALYSIS, ROUTINE W REFLEX MICROSCOPIC
BILIRUBIN URINE: NEGATIVE
Glucose, UA: NEGATIVE mg/dL
Hgb urine dipstick: NEGATIVE
Ketones, ur: NEGATIVE mg/dL
Leukocytes, UA: NEGATIVE
Nitrite: NEGATIVE
PH: 6 (ref 5.0–8.0)
Protein, ur: NEGATIVE mg/dL
SPECIFIC GRAVITY, URINE: 1.018 (ref 1.005–1.030)

## 2018-05-20 LAB — TYPE AND SCREEN
ABO/RH(D): O POS
ANTIBODY SCREEN: NEGATIVE

## 2018-05-20 LAB — SURGICAL PCR SCREEN
MRSA, PCR: NEGATIVE
STAPHYLOCOCCUS AUREUS: NEGATIVE

## 2018-05-20 LAB — APTT: aPTT: 27 seconds (ref 24–36)

## 2018-05-20 LAB — PROTIME-INR
INR: 0.94
PROTHROMBIN TIME: 12.5 s (ref 11.4–15.2)

## 2018-05-20 LAB — ABO/RH: ABO/RH(D): O POS

## 2018-05-20 NOTE — Progress Notes (Addendum)
Anesthesia Chart Review:  Case:  [id number] Date/Time:  05/26/18 1145   Procedure:  TOTAL HIP ARTHROPLASTY ANTERIOR APPROACH (Left )   Anesthesia type:  Spinal   Pre-op diagnosis:  OSTEOARTHRITIS LEFT HIP   Location:  [facility] ROOM 09 / [place] [facility]   Surgeon:  Dorna Leitz, MD      DISCUSSION: Patient is a 67 year old male scheduled for the above procedure.  History includes never smoker, HLD, Parkinson's disease, GERD (s/p Nissen fundoplication '11), hiatal hernia, esophageal dysmotility. For anesthesia history, he reported urinary retention and emesis with aspiration 1990's.  Dr. Sarajane Jews gave medical clearance for surgery at "low risk." Dr. Carles Collet gave neurology clearance (from Parkinson's Disease standpoint) for surgery at "low risk."  He has a chronically elevated total bilirubin. EKG appears stable since at least 05/31/14. He denied chest pain, SOB, cough, fever at his PAT visit. If no acute changes then I would anticipate that he can proceed as planned.   VS: BP 138/83   Pulse 98   Temp 36.8 C   Resp 20   Ht 5' 10.5" (1.791 m)   Wt 97 kg   SpO2 98%   BMI 30.26 kg/m    PROVIDERS: [name], MD is PCP Tat, Wells Guiles, DO is neurologist. Last visit 05/19/18.    LABS: Labs reviewed: Acceptable for surgery. Total bilirubin is elevated at 2.1, but historically is elevated (previously 2.9-3.1 from 06/23/15-09/02/17). AST/ALT WNL.  (all labs ordered are listed, but only abnormal results are displayed)  Labs Reviewed  CBC WITH DIFFERENTIAL/PLATELET - Abnormal; Notable for the following components:      Result Value   WBC 11.1 (*)    Neutro Abs 8.6 (*)    All other components within normal limits  COMPREHENSIVE METABOLIC PANEL - Abnormal; Notable for the following components:   Potassium 3.4 (*)    Glucose, Bld 108 (*)    Total Bilirubin 2.1 (*)    All other components within normal limits  SURGICAL PCR SCREEN  APTT  PROTIME-INR  URINALYSIS, ROUTINE W REFLEX MICROSCOPIC  TYPE AND SCREEN   ABO/RH    IMAGES: CXR 05/20/18:  IMPRESSION: Mild left base subsegmental atelectasis.   EKG: 05/20/18: NSR. Inferior-posterior infarct, age undetermined. EKG appears stable when compared to tracings from 06/03/15 and 05/31/14.    CV: Reported a remote history of a stress test > 10 years ago.   Past Medical History:  Diagnosis Date  . Arthritis   . Barrett's esophagus    sees Dr. Delfin Edis   . BPH with urinary obstruction   . Complication of anesthesia    Urinary retention. "fights it"- is what he was told. 1990's - vomitted and aspirated in surgery  . Diverticulosis   . Esophageal dysmotility   . GERD (gastroesophageal reflux disease)    not since Nessen Fundopliation  . History of hiatal hernia   . History of kidney stones    15 in 2015- last one was in Oct 2015- takes HCTZ to help prevent  . Hyperlipidemia   . Insomnia   . Lyme disease 2013  . Parkinson's disease [facility])    sees Dr. Wells Guiles [facility]   . [facility] spotted fever) 2012    Past Surgical History:  Procedure Laterality Date  . CHOLECYSTECTOMY    . COLONOSCOPY  06-24-13   per Dr. Olevia Perches, no polyps, diveticulosis only, repeat in 10 yrs   . ESOPHAGOGASTRODUODENOSCOPY  06-24-13   per Dr. Olevia Perches, no signs of Barretts,  repeat in 5 yrs   . HERNIA REPAIR Right    right inguinal  . LASIK     bilateral  . lipomas removed     x10/ on arms and legs  . NISSEN FUNDOPLICATION  0-23-34   laparoscopic per Dr. Johnathan Hausen  . ORCHIOPEXY     right side at age 10   . TONSILLECTOMY AND ADENOIDECTOMY    . TOTAL SHOULDER ARTHROPLASTY Right 06/23/2015   Procedure: RIGHT TOTAL SHOULDER ARTHROPLASTY;  Surgeon: Justice Britain, MD;  Location: [facility];  Service: Orthopedics;  Laterality: Right;    MEDICATIONS: . atorvastatin (LIPITOR) 20 MG tablet  . finasteride (PROSCAR) 5 MG tablet  . hydrochlorothiazide (MICROZIDE) 12.5 MG capsule  . ibuprofen (ADVIL,MOTRIN) 800 MG tablet  . MIRAPEX ER 1.5 MG TB24  .  polyethylene glycol (MIRALAX / GLYCOLAX) packet  . tamsulosin (FLOMAX) 0.4 MG CAPS capsule  . traMADol (ULTRAM) 50 MG tablet  . traZODone (DESYREL) 50 MG tablet   No current facility-administered medications for this encounter.     [facility] Short Stay Center/Anesthesiology Phone [phone] 05/21/2018 9:05 AM

## 2018-05-20 NOTE — Progress Notes (Signed)
PCP: [name]  Cardiologist: denies  DM: denies  SA: denies  Stress Test > 10 years  Pt denies SOB, cough, chest pain, fever  Pt states understanding of instructions given for day of surgery.

## 2018-05-23 ENCOUNTER — Other Ambulatory Visit: Payer: Self-pay | Admitting: Orthopedic Surgery

## 2018-05-23 MED ORDER — BUPIVACAINE LIPOSOME 1.3 % IJ SUSP
20.0000 mL | INTRAMUSCULAR | Status: DC
  Filled 2018-05-23: qty 20

## 2018-05-23 NOTE — Care Plan (Signed)
Spoke with patient. Will discharge to home with family and HHPT. Equipment ordered    Ladell Heads, [place]

## 2018-05-26 ENCOUNTER — Inpatient Hospital Stay (HOSPITAL_COMMUNITY): Payer: Medicare Other | Admitting: Anesthesiology

## 2018-05-26 ENCOUNTER — Other Ambulatory Visit: Payer: Self-pay

## 2018-05-26 ENCOUNTER — Encounter (HOSPITAL_COMMUNITY): Payer: Self-pay | Admitting: *Deleted

## 2018-05-26 ENCOUNTER — Inpatient Hospital Stay (HOSPITAL_COMMUNITY): Payer: Medicare Other

## 2018-05-26 ENCOUNTER — Encounter (HOSPITAL_COMMUNITY)
Admission: RE | Disposition: A | Discharge status: Home Health | Payer: Self-pay | Source: Home / Self Care | Attending: Orthopedic Surgery

## 2018-05-26 ENCOUNTER — Inpatient Hospital Stay (HOSPITAL_COMMUNITY): Payer: Medicare Other | Admitting: Vascular Surgery

## 2018-05-26 ENCOUNTER — Inpatient Hospital Stay (HOSPITAL_COMMUNITY)
Admission: RE | Admit: 2018-05-26 | Discharge: 2018-05-27 | DRG: 470 | Disposition: A | Discharge status: Home Health | Payer: Medicare Other | Attending: Orthopedic Surgery | Admitting: Orthopedic Surgery

## 2018-05-26 DIAGNOSIS — K2270 Barrett's esophagus without dysplasia: Secondary | ICD-10-CM | POA: Diagnosis not present

## 2018-05-26 DIAGNOSIS — K224 Dyskinesia of esophagus: Secondary | ICD-10-CM | POA: Diagnosis present

## 2018-05-26 DIAGNOSIS — Z87442 Personal history of urinary calculi: Secondary | ICD-10-CM

## 2018-05-26 DIAGNOSIS — Z801 Family history of malignant neoplasm of trachea, bronchus and lung: Secondary | ICD-10-CM

## 2018-05-26 DIAGNOSIS — N401 Enlarged prostate with lower urinary tract symptoms: Secondary | ICD-10-CM | POA: Diagnosis present

## 2018-05-26 DIAGNOSIS — Z880 Allergy status to penicillin: Secondary | ICD-10-CM

## 2018-05-26 DIAGNOSIS — Z9049 Acquired absence of other specified parts of digestive tract: Secondary | ICD-10-CM

## 2018-05-26 DIAGNOSIS — Z8619 Personal history of other infectious and parasitic diseases: Secondary | ICD-10-CM | POA: Diagnosis not present

## 2018-05-26 DIAGNOSIS — Z8261 Family history of arthritis: Secondary | ICD-10-CM

## 2018-05-26 DIAGNOSIS — Z96642 Presence of left artificial hip joint: Secondary | ICD-10-CM | POA: Diagnosis not present

## 2018-05-26 DIAGNOSIS — Z800 Family history of malignant neoplasm of digestive organs: Secondary | ICD-10-CM | POA: Diagnosis not present

## 2018-05-26 DIAGNOSIS — E785 Hyperlipidemia, unspecified: Secondary | ICD-10-CM | POA: Diagnosis present

## 2018-05-26 DIAGNOSIS — Z419 Encounter for procedure for purposes other than remedying health state, unspecified: Secondary | ICD-10-CM

## 2018-05-26 DIAGNOSIS — G4700 Insomnia, unspecified: Secondary | ICD-10-CM | POA: Diagnosis not present

## 2018-05-26 DIAGNOSIS — G20 Parkinson's disease: Secondary | ICD-10-CM | POA: Diagnosis not present

## 2018-05-26 DIAGNOSIS — K219 Gastro-esophageal reflux disease without esophagitis: Secondary | ICD-10-CM | POA: Diagnosis present

## 2018-05-26 DIAGNOSIS — M1612 Unilateral primary osteoarthritis, left hip: Principal | ICD-10-CM | POA: Diagnosis present

## 2018-05-26 DIAGNOSIS — Z471 Aftercare following joint replacement surgery: Secondary | ICD-10-CM | POA: Diagnosis not present

## 2018-05-26 DIAGNOSIS — Z96611 Presence of right artificial shoulder joint: Secondary | ICD-10-CM | POA: Diagnosis not present

## 2018-05-26 HISTORY — PX: TOTAL HIP ARTHROPLASTY: SHX124

## 2018-05-26 SURGERY — ARTHROPLASTY, HIP, TOTAL, ANTERIOR APPROACH
Anesthesia: Spinal | Site: Hip | Laterality: Left

## 2018-05-26 MED ORDER — BUPIVACAINE LIPOSOME 1.3 % IJ SUSP
INTRAMUSCULAR | Status: DC | PRN
  Administered 2018-05-26: 50 mL

## 2018-05-26 MED ORDER — METHOCARBAMOL 500 MG PO TABS
500.0000 mg | ORAL_TABLET | Freq: Four times a day (QID) | ORAL | Status: DC | PRN
  Administered 2018-05-26 – 2018-05-27 (×3): 500 mg via ORAL
  Filled 2018-05-26 (×3): qty 1

## 2018-05-26 MED ORDER — ASPIRIN EC 325 MG PO TBEC: 325.0000 mg | DELAYED_RELEASE_TABLET | Freq: Two times a day (BID) | ORAL | 0 refills | Status: DC

## 2018-05-26 MED ORDER — ONDANSETRON HCL 4 MG PO TABS: 4.0000 mg | ORAL_TABLET | Freq: Four times a day (QID) | ORAL | Status: DC | PRN

## 2018-05-26 MED ORDER — METHOCARBAMOL 1000 MG/10ML IJ SOLN
500.0000 mg | Freq: Four times a day (QID) | INTRAVENOUS | Status: DC | PRN
  Filled 2018-05-26: qty 5

## 2018-05-26 MED ORDER — EPHEDRINE SULFATE 50 MG/ML IJ SOLN
INTRAMUSCULAR | Status: DC | PRN
  Administered 2018-05-26: 5 mg via INTRAVENOUS
  Administered 2018-05-26 (×2): 10 mg via INTRAVENOUS

## 2018-05-26 MED ORDER — HYDROCHLOROTHIAZIDE 12.5 MG PO CAPS
12.5000 mg | ORAL_CAPSULE | Freq: Every day | ORAL | Status: DC
  Administered 2018-05-27: 12.5 mg via ORAL
  Filled 2018-05-26: qty 1

## 2018-05-26 MED ORDER — ONDANSETRON HCL 4 MG/2ML IJ SOLN
4.0000 mg | Freq: Four times a day (QID) | INTRAMUSCULAR | Status: DC | PRN
  Administered 2018-05-26: 4 mg via INTRAVENOUS
  Filled 2018-05-26: qty 2

## 2018-05-26 MED ORDER — CEFAZOLIN SODIUM-DEXTROSE 2-4 GM/100ML-% IV SOLN
2.0000 g | Freq: Four times a day (QID) | INTRAVENOUS | Status: AC
  Administered 2018-05-26 – 2018-05-27 (×2): 2 g via INTRAVENOUS
  Filled 2018-05-26 (×2): qty 100

## 2018-05-26 MED ORDER — ASPIRIN EC 325 MG PO TBEC
325.0000 mg | DELAYED_RELEASE_TABLET | Freq: Two times a day (BID) | ORAL | Status: DC
  Administered 2018-05-26 – 2018-05-27 (×2): 325 mg via ORAL
  Filled 2018-05-26 (×2): qty 1

## 2018-05-26 MED ORDER — LACTATED RINGERS IV SOLN
INTRAVENOUS | Status: DC
  Administered 2018-05-26 (×2): via INTRAVENOUS

## 2018-05-26 MED ORDER — DEXAMETHASONE SODIUM PHOSPHATE 10 MG/ML IJ SOLN
INTRAMUSCULAR | Status: DC | PRN
  Administered 2018-05-26: 4 mg via INTRAVENOUS

## 2018-05-26 MED ORDER — CLINDAMYCIN PHOSPHATE 900 MG/50ML IV SOLN
900.0000 mg | INTRAVENOUS | Status: AC
  Administered 2018-05-26: 900 mg via INTRAVENOUS
  Filled 2018-05-26: qty 50

## 2018-05-26 MED ORDER — PROPOFOL 10 MG/ML IV BOLUS
INTRAVENOUS | Status: AC
  Filled 2018-05-26: qty 20

## 2018-05-26 MED ORDER — OXYCODONE-ACETAMINOPHEN 5-325 MG PO TABS: 1.0000 | ORAL_TABLET | Freq: Four times a day (QID) | ORAL | 0 refills | Status: DC | PRN

## 2018-05-26 MED ORDER — FENTANYL CITRATE (PF) 100 MCG/2ML IJ SOLN: 25.0000 ug | INTRAMUSCULAR | Status: DC | PRN

## 2018-05-26 MED ORDER — DEXAMETHASONE SODIUM PHOSPHATE 10 MG/ML IJ SOLN
INTRAMUSCULAR | Status: AC
  Filled 2018-05-26: qty 3

## 2018-05-26 MED ORDER — CHLORHEXIDINE GLUCONATE 4 % EX LIQD: 60.0000 mL | Freq: Once | CUTANEOUS | Status: DC

## 2018-05-26 MED ORDER — ONDANSETRON HCL 4 MG/2ML IJ SOLN
INTRAMUSCULAR | Status: DC | PRN
  Administered 2018-05-26: 4 mg via INTRAVENOUS

## 2018-05-26 MED ORDER — SODIUM CHLORIDE 0.9 % IV SOLN: INTRAVENOUS | Status: DC

## 2018-05-26 MED ORDER — SUCCINYLCHOLINE CHLORIDE 200 MG/10ML IV SOSY
PREFILLED_SYRINGE | INTRAVENOUS | Status: AC
  Filled 2018-05-26: qty 10

## 2018-05-26 MED ORDER — PROPOFOL 500 MG/50ML IV EMUL
INTRAVENOUS | Status: DC | PRN
  Administered 2018-05-26: 50 ug/kg/min via INTRAVENOUS
  Administered 2018-05-26: 100 ug/kg/min via INTRAVENOUS

## 2018-05-26 MED ORDER — TIZANIDINE HCL 2 MG PO TABS: 2.0000 mg | ORAL_TABLET | Freq: Three times a day (TID) | ORAL | 0 refills | Status: DC | PRN

## 2018-05-26 MED ORDER — FENTANYL CITRATE (PF) 250 MCG/5ML IJ SOLN
INTRAMUSCULAR | Status: AC
  Filled 2018-05-26: qty 5

## 2018-05-26 MED ORDER — DOCUSATE SODIUM 100 MG PO CAPS: 100.0000 mg | ORAL_CAPSULE | Freq: Two times a day (BID) | ORAL | 0 refills | Status: DC

## 2018-05-26 MED ORDER — LIDOCAINE 2% (20 MG/ML) 5 ML SYRINGE
INTRAMUSCULAR | Status: AC
  Filled 2018-05-26: qty 5

## 2018-05-26 MED ORDER — ONDANSETRON HCL 4 MG/2ML IJ SOLN
INTRAMUSCULAR | Status: AC
  Filled 2018-05-26: qty 2

## 2018-05-26 MED ORDER — BUPIVACAINE HCL (PF) 0.5 % IJ SOLN
INTRAMUSCULAR | Status: AC
  Filled 2018-05-26: qty 30

## 2018-05-26 MED ORDER — DEXAMETHASONE SODIUM PHOSPHATE 10 MG/ML IJ SOLN
10.0000 mg | Freq: Two times a day (BID) | INTRAMUSCULAR | Status: DC
  Administered 2018-05-26 – 2018-05-27 (×2): 10 mg via INTRAVENOUS
  Filled 2018-05-26 (×2): qty 1

## 2018-05-26 MED ORDER — FINASTERIDE 5 MG PO TABS
5.0000 mg | ORAL_TABLET | Freq: Every day | ORAL | Status: DC
  Administered 2018-05-27: 5 mg via ORAL
  Filled 2018-05-26: qty 1

## 2018-05-26 MED ORDER — MIDAZOLAM HCL 2 MG/2ML IJ SOLN
INTRAMUSCULAR | Status: AC
  Filled 2018-05-26: qty 2

## 2018-05-26 MED ORDER — POLYETHYLENE GLYCOL 3350 17 G PO PACK: 17.0000 g | PACK | Freq: Every day | ORAL | Status: DC | PRN

## 2018-05-26 MED ORDER — BUPIVACAINE IN DEXTROSE 0.75-8.25 % IT SOLN
INTRATHECAL | Status: DC | PRN
  Administered 2018-05-26: 1.8 mL via INTRATHECAL

## 2018-05-26 MED ORDER — GABAPENTIN 300 MG PO CAPS
300.0000 mg | ORAL_CAPSULE | Freq: Two times a day (BID) | ORAL | Status: DC
  Administered 2018-05-26 – 2018-05-27 (×2): 300 mg via ORAL
  Filled 2018-05-26 (×2): qty 1

## 2018-05-26 MED ORDER — TRANEXAMIC ACID-NACL 1000-0.7 MG/100ML-% IV SOLN
1000.0000 mg | INTRAVENOUS | Status: AC
  Administered 2018-05-26: 1000 mg via INTRAVENOUS
  Filled 2018-05-26: qty 100

## 2018-05-26 MED ORDER — MIDAZOLAM HCL 2 MG/2ML IJ SOLN
INTRAMUSCULAR | Status: DC | PRN
  Administered 2018-05-26: 2 mg via INTRAVENOUS

## 2018-05-26 MED ORDER — BISACODYL 5 MG PO TBEC: 5.0000 mg | DELAYED_RELEASE_TABLET | Freq: Every day | ORAL | Status: DC | PRN

## 2018-05-26 MED ORDER — ATORVASTATIN CALCIUM 20 MG PO TABS
20.0000 mg | ORAL_TABLET | Freq: Every day | ORAL | Status: DC
  Administered 2018-05-26: 20 mg via ORAL
  Filled 2018-05-26: qty 1

## 2018-05-26 MED ORDER — BUPIVACAINE HCL 0.5 % IJ SOLN: INTRAMUSCULAR | Status: DC | PRN

## 2018-05-26 MED ORDER — CELECOXIB 200 MG PO CAPS
200.0000 mg | ORAL_CAPSULE | Freq: Two times a day (BID) | ORAL | Status: DC
  Administered 2018-05-26 – 2018-05-27 (×2): 200 mg via ORAL
  Filled 2018-05-26 (×2): qty 1

## 2018-05-26 MED ORDER — PROPOFOL 10 MG/ML IV BOLUS
INTRAVENOUS | Status: DC | PRN
  Administered 2018-05-26: 20 mg via INTRAVENOUS
  Administered 2018-05-26 (×3): 10 mg via INTRAVENOUS

## 2018-05-26 MED ORDER — TRANEXAMIC ACID-NACL 1000-0.7 MG/100ML-% IV SOLN: 1000.0000 mg | INTRAVENOUS | Status: DC

## 2018-05-26 MED ORDER — OXYCODONE HCL 5 MG PO TABS
ORAL_TABLET | ORAL | Status: AC
  Filled 2018-05-26: qty 2

## 2018-05-26 MED ORDER — SODIUM CHLORIDE 0.9 % IV SOLN
INTRAVENOUS | Status: DC | PRN
  Administered 2018-05-26: 30 ug/min via INTRAVENOUS

## 2018-05-26 MED ORDER — TRANEXAMIC ACID-NACL 1000-0.7 MG/100ML-% IV SOLN
1000.0000 mg | Freq: Once | INTRAVENOUS | Status: AC
  Administered 2018-05-26: 1000 mg via INTRAVENOUS
  Filled 2018-05-26: qty 100

## 2018-05-26 MED ORDER — ALUM & MAG HYDROXIDE-SIMETH 200-200-20 MG/5ML PO SUSP: 30.0000 mL | ORAL | Status: DC | PRN

## 2018-05-26 MED ORDER — DOCUSATE SODIUM 100 MG PO CAPS
100.0000 mg | ORAL_CAPSULE | Freq: Two times a day (BID) | ORAL | Status: DC
  Administered 2018-05-26 – 2018-05-27 (×2): 100 mg via ORAL
  Filled 2018-05-26 (×2): qty 1

## 2018-05-26 MED ORDER — ACETAMINOPHEN 325 MG PO TABS
325.0000 mg | ORAL_TABLET | Freq: Four times a day (QID) | ORAL | Status: DC | PRN
  Administered 2018-05-27: 650 mg via ORAL
  Filled 2018-05-26: qty 2

## 2018-05-26 MED ORDER — OXYCODONE HCL 5 MG PO TABS
5.0000 mg | ORAL_TABLET | ORAL | Status: DC | PRN
  Administered 2018-05-26 – 2018-05-27 (×2): 10 mg via ORAL
  Filled 2018-05-26 (×2): qty 2

## 2018-05-26 MED ORDER — HYDROMORPHONE HCL 1 MG/ML IJ SOLN
0.5000 mg | INTRAMUSCULAR | Status: DC | PRN
  Administered 2018-05-26 (×2): 1 mg via INTRAVENOUS
  Filled 2018-05-26 (×2): qty 1

## 2018-05-26 MED ORDER — PRAMIPEXOLE DIHYDROCHLORIDE 0.25 MG PO TABS
0.5000 mg | ORAL_TABLET | Freq: Three times a day (TID) | ORAL | Status: DC
  Administered 2018-05-27: 0.5 mg via ORAL
  Filled 2018-05-26 (×2): qty 2

## 2018-05-26 MED ORDER — TAMSULOSIN HCL 0.4 MG PO CAPS
0.4000 mg | ORAL_CAPSULE | Freq: Every day | ORAL | Status: DC
  Administered 2018-05-27: 0.4 mg via ORAL
  Filled 2018-05-26: qty 1

## 2018-05-26 MED ORDER — MAGNESIUM CITRATE PO SOLN: 1.0000 | Freq: Once | ORAL | Status: DC | PRN

## 2018-05-26 MED ORDER — DIPHENHYDRAMINE HCL 12.5 MG/5ML PO ELIX: 12.5000 mg | ORAL_SOLUTION | ORAL | Status: DC | PRN

## 2018-05-26 MED ORDER — EPHEDRINE 5 MG/ML INJ
INTRAVENOUS | Status: AC
  Filled 2018-05-26: qty 10

## 2018-05-26 MED ORDER — 0.9 % SODIUM CHLORIDE (POUR BTL) OPTIME
TOPICAL | Status: DC | PRN
  Administered 2018-05-26: 1000 mL

## 2018-05-26 MED ORDER — FENTANYL CITRATE (PF) 250 MCG/5ML IJ SOLN
INTRAMUSCULAR | Status: DC | PRN
  Administered 2018-05-26: 25 ug via INTRAVENOUS
  Administered 2018-05-26: 50 ug via INTRAVENOUS

## 2018-05-26 SURGICAL SUPPLY — 61 items
BENZOIN TINCTURE PRP APPL 2/3 (GAUZE/BANDAGES/DRESSINGS) ×3 IMPLANT
BLADE CLIPPER SURG (BLADE) IMPLANT
BNDG GAUZE ELAST 4 BULKY (GAUZE/BANDAGES/DRESSINGS) IMPLANT
CELLS DAT CNTRL 66122 CELL SVR (MISCELLANEOUS) IMPLANT
CLOSURE STERI-STRIP 1/2X4 (GAUZE/BANDAGES/DRESSINGS) ×1
CLSR STERI-STRIP ANTIMIC 1/2X4 (GAUZE/BANDAGES/DRESSINGS) ×2 IMPLANT
COVER PERINEAL POST (MISCELLANEOUS) ×3 IMPLANT
COVER SURGICAL LIGHT HANDLE (MISCELLANEOUS) ×3 IMPLANT
COVER WAND RF STERILE (DRAPES) ×3 IMPLANT
CUP ACETBLR 54 OD 100 SERIES (Hips) ×3 IMPLANT
DRAPE C-ARM 42X72 X-RAY (DRAPES) ×3 IMPLANT
DRAPE STERI IOBAN 125X83 (DRAPES) ×3 IMPLANT
DRAPE U-SHAPE 47X51 STRL (DRAPES) ×6 IMPLANT
DRSG AQUACEL AG ADV 3.5X 6 (GAUZE/BANDAGES/DRESSINGS) ×3 IMPLANT
DRSG AQUACEL AG ADV 3.5X10 (GAUZE/BANDAGES/DRESSINGS) ×3 IMPLANT
DURAPREP 26ML APPLICATOR (WOUND CARE) ×3 IMPLANT
ELECT BLADE 4.0 EZ CLEAN MEGAD (MISCELLANEOUS)
ELECT CAUTERY BLADE 6.4 (BLADE) ×3 IMPLANT
ELECT REM PT RETURN 9FT ADLT (ELECTROSURGICAL) ×3
ELECTRODE BLDE 4.0 EZ CLN MEGD (MISCELLANEOUS) IMPLANT
ELECTRODE REM PT RTRN 9FT ADLT (ELECTROSURGICAL) ×1 IMPLANT
ELIMINATOR HOLE APEX DEPUY (Hips) ×3 IMPLANT
GLOVE BIOGEL PI IND STRL 8 (GLOVE) ×2 IMPLANT
GLOVE BIOGEL PI INDICATOR 8 (GLOVE) ×4
GLOVE ECLIPSE 7.5 STRL STRAW (GLOVE) ×6 IMPLANT
GOWN STRL REUS W/ TWL LRG LVL3 (GOWN DISPOSABLE) ×2 IMPLANT
GOWN STRL REUS W/ TWL XL LVL3 (GOWN DISPOSABLE) ×2 IMPLANT
GOWN STRL REUS W/TWL LRG LVL3 (GOWN DISPOSABLE) ×4
GOWN STRL REUS W/TWL XL LVL3 (GOWN DISPOSABLE) ×4
HEAD CERAMIC DELTA 36 PLUS 1.5 (Hips) ×3 IMPLANT
HOOD PEEL AWAY FACE SHEILD DIS (HOOD) ×3 IMPLANT
HOOD PEEL AWAY FLYTE STAYCOOL (MISCELLANEOUS) ×6 IMPLANT
KIT BASIN OR (CUSTOM PROCEDURE TRAY) ×3 IMPLANT
KIT TURNOVER KIT B (KITS) ×3 IMPLANT
LINER NEUTRAL 54X36MM PLUS 4 (Hips) ×3 IMPLANT
MANIFOLD NEPTUNE II (INSTRUMENTS) ×3 IMPLANT
NEEDLE SPNL 22GX3.5 QUINCKE BK (NEEDLE) ×3 IMPLANT
NS IRRIG 1000ML POUR BTL (IV SOLUTION) ×3 IMPLANT
PACK TOTAL JOINT (CUSTOM PROCEDURE TRAY) ×3 IMPLANT
PACK UNIVERSAL I (CUSTOM PROCEDURE TRAY) ×3 IMPLANT
PAD ARMBOARD 7.5X6 YLW CONV (MISCELLANEOUS) ×6 IMPLANT
RTRCTR WOUND ALEXIS 18CM MED (MISCELLANEOUS)
RTRCTR WOUND ALEXIS 18CM SML (INSTRUMENTS) ×3
SAVER CELL AAL HAEMONETICS (INSTRUMENTS) ×1 IMPLANT
SAW OSC TIP CART 19.5X105X1.3 (SAW) ×3 IMPLANT
SPONGE LAP 18X18 X RAY DECT (DISPOSABLE) IMPLANT
STAPLER VISISTAT 35W (STAPLE) IMPLANT
STEM CORAIL KA12 (Stem) ×3 IMPLANT
SUT ETHIBOND NAB CT1 #1 30IN (SUTURE) ×6 IMPLANT
SUT MNCRL AB 3-0 PS2 18 (SUTURE) ×3 IMPLANT
SUT VIC AB 0 CT1 27 (SUTURE) ×2
SUT VIC AB 0 CT1 27XBRD ANBCTR (SUTURE) ×1 IMPLANT
SUT VIC AB 1 CT1 27 (SUTURE) ×4
SUT VIC AB 1 CT1 27XBRD ANBCTR (SUTURE) ×2 IMPLANT
SUT VIC AB 2-0 CT1 27 (SUTURE) ×2
SUT VIC AB 2-0 CT1 TAPERPNT 27 (SUTURE) ×1 IMPLANT
SYR 50ML LL SCALE MARK (SYRINGE) ×3 IMPLANT
TOWEL OR 17X24 6PK STRL BLUE (TOWEL DISPOSABLE) ×3 IMPLANT
TOWEL OR 17X26 10 PK STRL BLUE (TOWEL DISPOSABLE) ×3 IMPLANT
TRAY CATH 16FR W/PLASTIC CATH (SET/KITS/TRAYS/PACK) IMPLANT
TRAY FOLEY CATH SILVER 16FR (SET/KITS/TRAYS/PACK) IMPLANT

## 2018-05-26 NOTE — Anesthesia Preprocedure Evaluation (Addendum)
Anesthesia Evaluation  Patient identified by MRN, date of birth, ID band Patient awake    Reviewed: Allergy & Precautions, NPO status , Patient's Chart, lab work & pertinent test results  Airway Mallampati: II  TM Distance: >3 FB Neck ROM: Full    Dental no notable dental hx. (+) Teeth Intact, Dental Advisory Given   Pulmonary neg pulmonary ROS,    Pulmonary exam normal breath sounds clear to auscultation       Cardiovascular negative cardio ROS Normal cardiovascular exam Rhythm:Regular Rate:Normal  EKG: 05/20/18: NSR. Inferior-posterior infarct, age undetermined. EKG appears stable when compared to tracings from 06/03/15 and 05/31/14.    CV: Reported a remote history of a stress test > 10 years ago.   Neuro/Psych  Neuromuscular disease (Parkinson's) negative psych ROS   GI/Hepatic Neg liver ROS, hiatal hernia, GERD  ,  Endo/Other  negative endocrine ROS  Renal/GU negative Renal ROS  negative genitourinary   Musculoskeletal  (+) Arthritis ,   Abdominal   Peds negative pediatric ROS (+)  Hematology negative hematology ROS (+)   Anesthesia Other Findings   Reproductive/Obstetrics negative OB ROS                            Anesthesia Physical Anesthesia Plan  ASA: III  Anesthesia Plan: Spinal   Post-op Pain Management:    Induction: Intravenous  PONV Risk Score and Plan: 1 and Dexamethasone, Ondansetron and Midazolam  Airway Management Planned: Natural Airway and Simple Face Mask  Additional Equipment:   Intra-op Plan:   Post-operative Plan:   Informed Consent: I have reviewed the patients History and Physical, chart, labs and discussed the procedure including the risks, benefits and alternatives for the proposed anesthesia with the patient or authorized representative who has indicated his/her understanding and acceptance.   Dental advisory given  Plan Discussed with:  CRNA  Anesthesia Plan Comments:         Anesthesia Quick Evaluation

## 2018-05-26 NOTE — Care Plan (Signed)
Ortho Bundle Case Management Note  Patient Details  Name: [name] [name] MRN: [id number] Date of Birth: 1950-08-05   Spoke with patient. Will discharge to home with family and HHPT. Equipment ordered                    DME Arranged:  [place] rolling DME Agency:  [employer]  HH Arranged:  PT [place] Agency:  [facility]  Additional Comments: Please contact me with any questions of if this plan should need to change.  Ladell Heads,  [place] [facility]  [phone] 05/26/2018, 10:23 AM

## 2018-05-26 NOTE — Evaluation (Signed)
Physical Therapy Evaluation Patient Details Name: [name] [name] MRN: [id number] DOB: 04-Sep-1950 Today's Date: 05/26/2018   History of Present Illness  Pt is a 67 y/o male s/p elective L THA, direct anterior approach. PMH includes Parkinson's disease and R TSA.   Clinical Impression  Pt is s/p surgery above with deficits below. Pt tolerated gait training very well this session and overall steady using RW. Required min guard A throughout mobility tasks. Reviewed seated HEP and will need to review supine/standing HEP during next sessions. Will continue to follow acutely to maximize functional mobility independence and safety.     Follow Up Recommendations Follow surgeon's recommendation for DC plan and follow-up therapies;Supervision for mobility/OOB    Equipment Recommendations  Rolling walker with 5" wheels;3in1 (PT)    Recommendations for Other Services       Precautions / Restrictions Precautions Precautions: None Restrictions Weight Bearing Restrictions: Yes LLE Weight Bearing: Weight bearing as tolerated      Mobility  Bed Mobility               General bed mobility comments: In chair upon entry   Transfers Overall transfer level: Needs assistance   Transfers: Sit to/from Stand Sit to Stand: Min guard         General transfer comment: Min guard for safety. Cues for hand placement. Increased time required to perform.   Ambulation/Gait Ambulation/Gait assistance: Min guard Gait Distance (Feet): 125 Feet Assistive device: Rolling walker (2 wheeled) Gait Pattern/deviations: Step-through pattern;Decreased stride length;Decreased weight shift to left;Antalgic Gait velocity: Decreased    General Gait Details: Slow, mildly antalgic gait. Cues for proximity to device and sequencing using RW.   Stairs            Wheelchair Mobility    Modified Rankin (Stroke Patients Only)       Balance Overall balance assessment: Needs  assistance Sitting-balance support: No upper extremity supported;Feet supported Sitting balance-Leahy Scale: Good     Standing balance support: Bilateral upper extremity supported;During functional activity Standing balance-Leahy Scale: Poor Standing balance comment: Reliant on BUE support.                             Pertinent Vitals/Pain Pain Assessment: Faces Faces Pain Scale: Hurts little more Pain Location: L hip  Pain Descriptors / Indicators: Aching;Operative site guarding Pain Intervention(s): Limited activity within patient's tolerance;Monitored during session;Repositioned    Home Living Family/patient expects to be discharged to:: Private residence Living Arrangements: Spouse/significant other Available Help at Discharge: Family;Available 24 hours/day Type of Home: House Home Access: Stairs to enter Entrance Stairs-Rails: Right Entrance Stairs-Number of Steps: 3 Home Layout: Multi-level;Able to live on main level with bedroom/bathroom Home Equipment: Grab bars - tub/shower;Grab bars - toilet      Prior Function Level of Independence: Independent         Comments: Pt very active before surgery, still performing yard work independently.      Hand Dominance        Extremity/Trunk Assessment   Upper Extremity Assessment Upper Extremity Assessment: Overall WFL for tasks assessed    Lower Extremity Assessment Lower Extremity Assessment: LLE deficits/detail LLE Deficits / Details: Deficits consistent with post op pain and weakness.     Cervical / Trunk Assessment Cervical / Trunk Assessment: Normal  Communication   Communication: No difficulties  Cognition Arousal/Alertness: Awake/alert Behavior During Therapy: WFL for tasks assessed/performed Overall Cognitive Status: Within Functional Limits for tasks assessed  General Comments General comments (skin integrity, edema, etc.): Pt's  wife and daughter present at beginning of session.     Exercises Total Joint Exercises Ankle Circles/Pumps: AROM;Both;10 reps Long Arc Quad: AROM;Left;10 reps   Assessment/Plan    PT Assessment Patient needs continued PT services  PT Problem List Decreased strength;Decreased mobility;Decreased knowledge of use of DME;Pain       PT Treatment Interventions DME instruction;Gait training;Functional mobility training;Stair training;Therapeutic activities;Balance training;Therapeutic exercise;Patient/family education    PT Goals (Current goals can be found in the Care Plan section)  Acute Rehab PT Goals Patient Stated Goal: to go home ASAP  PT Goal Formulation: With patient Time For Goal Achievement: 06/09/18 Potential to Achieve Goals: Good    Frequency 7X/week   Barriers to discharge        Co-evaluation               AM-PAC PT "6 Clicks" Daily Activity  Outcome Measure Difficulty turning over in bed (including adjusting bedclothes, sheets and blankets)?: A Little Difficulty moving from lying on back to sitting on the side of the bed? : A Little Difficulty sitting down on and standing up from a chair with arms (e.g., wheelchair, bedside commode, etc,.)?: Unable Help needed moving to and from a bed to chair (including a wheelchair)?: A Little Help needed walking in hospital room?: A Little Help needed climbing 3-5 steps with a railing? : A Lot 6 Click Score: 15    End of Session Equipment Utilized During Treatment: Gait belt Activity Tolerance: Patient tolerated treatment well Patient left: in chair;with call bell/phone within reach Nurse Communication: Mobility status PT Visit Diagnosis: Other abnormalities of gait and mobility (R26.89);Pain Pain - Right/Left: Left Pain - part of body: Hip    Time: 1308-6578 PT Time Calculation (min) (ACUTE ONLY): 17 min   Charges:   PT Evaluation $PT Eval Low Complexity: [place], PT, DPT   Acute Rehabilitation Services  Pager: [phone] Office: [phone]   Rudean Hitt 05/26/2018, 6:01 PM

## 2018-05-26 NOTE — Brief Op Note (Signed)
05/26/2018  1:57 PM  PATIENT:  [name] [name]  67 y.o. male  PRE-OPERATIVE DIAGNOSIS:  OSTEOARTHRITIS LEFT HIP  POST-OPERATIVE DIAGNOSIS:  OSTEOARTHRITIS LEFT HIP  PROCEDURE:  Procedure(s): TOTAL HIP ARTHROPLASTY ANTERIOR APPROACH (Left)  SURGEON:  Surgeon(s) and Role:    Dorna Leitz, MD - Primary  PHYSICIAN ASSISTANT:   ASSISTANTS: jim bethune   ANESTHESIA:   spinal  EBL:  250 mL   BLOOD ADMINISTERED:none  DRAINS: none   LOCAL MEDICATIONS USED:  MARCAINE    and OTHER experel  SPECIMEN:  No Specimen  DISPOSITION OF SPECIMEN:  N/A  COUNTS:  YES  TOURNIQUET:  * No tourniquets in log *  DICTATION: .Other Dictation: Dictation Number [id number]  PLAN OF CARE: Admit to inpatient   PATIENT DISPOSITION:  PACU - hemodynamically stable.   Delay start of Pharmacological VTE agent (>24hrs) due to surgical blood loss or risk of bleeding: no

## 2018-05-26 NOTE — Discharge Instructions (Signed)

## 2018-05-26 NOTE — Transfer of Care (Signed)
Immediate Anesthesia Transfer of Care Note  Patient: [name] [name]  Procedure(s) Performed: TOTAL HIP ARTHROPLASTY ANTERIOR APPROACH (Left Hip)  Patient Location: PACU  Anesthesia Type:Spinal  Level of Consciousness: awake, alert  and oriented  Airway & Oxygen Therapy: Patient Spontanous Breathing and Patient connected to face mask oxygen  Post-op Assessment: Report given to RN and Post -op Vital signs reviewed and stable  Post vital signs: Reviewed and stable  Last Vitals:  Vitals Value Taken Time  BP 95/60 05/26/2018  2:20 PM  Temp    Pulse 82 05/26/2018  2:21 PM  Resp 18 05/26/2018  2:21 PM  SpO2 95 % 05/26/2018  2:21 PM  Vitals shown include unvalidated device data.  Last Pain:  Vitals:   05/26/18 1005  TempSrc:   PainSc: 0-No pain         Complications: No apparent anesthesia complications

## 2018-05-26 NOTE — Anesthesia Procedure Notes (Signed)
Spinal  Patient location during procedure: OR Start time: 05/26/2018 12:10 PM End time: 05/26/2018 12:20 PM Staffing Anesthesiologist: Freddrick March, MD Performed: anesthesiologist  Preanesthetic Checklist Completed: patient identified, surgical consent, pre-op evaluation, timeout performed, IV checked, risks and benefits discussed and monitors and equipment checked Spinal Block Patient position: sitting Prep: site prepped and draped and DuraPrep Patient monitoring: cardiac monitor, continuous pulse ox and blood pressure Approach: midline Location: L3-4 Injection technique: single-shot Needle Needle type: Pencan  Needle gauge: 24 G Needle length: 9 cm Assessment Sensory level: T6 Additional Notes Functioning IV was confirmed and monitors were applied. Sterile prep and drape, including hand hygiene and sterile gloves were used. The patient was positioned and the spine was prepped. The skin was anesthetized with lidocaine.  Free flow of clear CSF was obtained prior to injecting local anesthetic into the CSF.  The spinal needle aspirated freely following injection.  The needle was carefully withdrawn.  The patient tolerated the procedure well.

## 2018-05-26 NOTE — H&P (Signed)
TOTAL HIP ADMISSION H&P  Patient is admitted for left total hip arthroplasty.  Subjective:  Chief Complaint: left hip pain  HPI: [name] [name], 67 y.o. male, has a history of pain and functional disability in the left hip(s) due to arthritis and patient has failed non-surgical conservative treatments for greater than 12 weeks to include NSAID's and/or analgesics, corticosteriod injections and use of the cane..  Onset of symptoms was gradual starting 5 years ago with gradually worsening course since that time.The patient noted no past surgery on the left hip(s).  Patient currently rates pain in the left hip at 7 out of 10 with activity. Patient has night pain, worsening of pain with activity and weight bearing, trendelenberg gait, pain that interfers with activities of daily living, pain with passive range of motion and joint swelling. Patient has evidence of subchondral sclerosis, periarticular osteophytes and joint space narrowing by imaging studies. This condition presents safety issues increasing the risk of falls. This patient has had failure of all reasonable conservative care.  There is no current active infection.  Patient Active Problem List   Diagnosis Date Noted  . Umbilical hernia 35/46/5681  . Sciatica of left side 09/02/2017  . Paralysis agitans ([place]) 09/21/2013  . BARRETTS ESOPHAGUS 12/13/2009  . DIVERTICULOSIS, COLON 09/01/2009  . VOCAL CORD DISORDER 08/12/2009  . ALLERGIC RHINITIS 03/02/2009  . INSOMNIA, PERSISTENT 02/03/2007  . BPH with urinary obstruction 02/03/2007  . Dyslipidemia 01/28/2007  . GERD 01/28/2007   Past Medical History:  Diagnosis Date  . Arthritis   . Barrett's esophagus    sees Dr. Delfin Edis   . BPH with urinary obstruction   . Complication of anesthesia    Urinary retention. "fights it"- is what he was told. 1990's - vomitted and aspirated in surgery  . Diverticulosis   . Esophageal dysmotility   . GERD (gastroesophageal reflux disease)     not since Nessen Fundopliation  . History of hiatal hernia   . History of kidney stones    15 in 2015- last one was in Oct 2015- takes HCTZ to help prevent  . Hyperlipidemia   . Insomnia   . Lyme disease 2013  . Parkinson's disease [facility])    sees Dr. Wells Guiles Tat   . RMSF [facility] spotted fever) 2012    Past Surgical History:  Procedure Laterality Date  . CHOLECYSTECTOMY    . COLONOSCOPY  06-24-13   per Dr. Olevia Perches, no polyps, diveticulosis only, repeat in 10 yrs   . ESOPHAGOGASTRODUODENOSCOPY  06-24-13   per Dr. Olevia Perches, no signs of Barretts, repeat in 5 yrs   . HERNIA REPAIR Right    right inguinal  . LASIK     bilateral  . lipomas removed     x10/ on arms and legs  . NISSEN FUNDOPLICATION  2-75-17   laparoscopic per Dr. Johnathan Hausen  . ORCHIOPEXY     right side at age 27   . TONSILLECTOMY AND ADENOIDECTOMY    . TOTAL SHOULDER ARTHROPLASTY Right 06/23/2015   Procedure: RIGHT TOTAL SHOULDER ARTHROPLASTY;  Surgeon: Justice Britain, MD;  Location: [place];  Service: Orthopedics;  Laterality: Right;    Current Facility-Administered Medications  Medication Dose Route Frequency Provider Last Rate Last Dose  . 0.9 % irrigation (POUR BTL)    PRN Dorna Leitz, MD   1,000 mL at 05/26/18 1202  . bupivacaine (MARCAINE) 0.5 % (with pres) injection    PRN Dorna Leitz, MD   10 mL at 05/26/18 1202  .  bupivacaine liposome (EXPAREL) 1.3 % injection 266 mg  20 mL Other To OR Kairo Laubacher, MD      . chlorhexidine (HIBICLENS) 4 % liquid 4 application  60 mL Topical Once Dorna Leitz, MD      . clindamycin (CLEOCIN) IVPB 900 mg  900 mg Intravenous On Call to OR Dorna Leitz, MD      . lactated ringers infusion   Intravenous Continuous Hulan Fray L, MD 50 mL/hr at 05/26/18 1008     Allergies  Allergen Reactions  . Penicillins Rash and Other (See Comments)    Has patient had a PCN reaction causing immediate rash, facial/tongue/throat swelling, SOB or lightheadedness with hypotension:  No Has patient had a PCN reaction causing severe rash involving mucus membranes or skin necrosis: No Has patient had a PCN reaction that required hospitalization: No Has patient had a PCN reaction occurring within the last 10 years: No If all of the above answers are "NO", then may proceed with Cephalosporin use.     Social History   Tobacco Use  . Smoking status: Never Smoker  . Smokeless tobacco: Never Used  Substance Use Topics  . Alcohol use: No    Alcohol/week: 0.0 standard drinks    Family History  Problem Relation Age of Onset  . Arthritis Mother   . Esophageal cancer Father   . Lung cancer Maternal Grandfather   . Esophageal cancer Paternal Grandmother   . Lung cancer Paternal Grandfather   . Colon cancer Sister        questionable  . Esophageal cancer Sister   . Esophageal cancer Paternal Aunt        x 2  . Esophageal cancer Cousin   . Rectal cancer Neg Hx   . Stomach cancer Neg Hx      ROS ROS: I have reviewed the patient's review of systems thoroughly and there are no positive responses as relates to the HPI. Objective:  Physical Exam  Vital signs in last 24 hours: Temp:  [98.3 F (36.8 C)] 98.3 F (36.8 C) (11/11 0948) Pulse Rate:  [101] 101 (11/11 0948) Resp:  [18] 18 (11/11 0948) BP: (129)/(82) 129/82 (11/11 0948) SpO2:  [95 %] 95 % (11/11 0948) Weight:  [97 kg] 97 kg (11/11 0948) Well-developed well-nourished patient in no acute distress. Alert and oriented x3 HEENT:within normal limits Cardiac: Regular rate and rhythm Pulmonary: Lungs clear to auscultation Abdomen: Soft and nontender.  Normal active bowel sounds  Musculoskeletal: (left hip: Limited range of motion. Painful range of motion. No internal rotation. Neurovascular intact distally. Labs: Recent Results (from the past 2160 hour(s))  Urinalysis, Routine w reflex microscopic     Status: None   Collection Time: 05/20/18  2:47 PM  Result Value Ref Range   Color, Urine YELLOW YELLOW    APPearance CLEAR CLEAR   Specific Gravity, Urine 1.018 1.005 - 1.030   pH 6.0 5.0 - 8.0   Glucose, UA NEGATIVE NEGATIVE mg/dL   Hgb urine dipstick NEGATIVE NEGATIVE   Bilirubin Urine NEGATIVE NEGATIVE   Ketones, ur NEGATIVE NEGATIVE mg/dL   Protein, ur NEGATIVE NEGATIVE mg/dL   Nitrite NEGATIVE NEGATIVE   Leukocytes, UA NEGATIVE NEGATIVE    Comment: Performed at [facility] [address], [place], [place] [address]  Surgical pcr screen     Status: None   Collection Time: 05/20/18  2:47 PM  Result Value Ref Range   MRSA, PCR NEGATIVE NEGATIVE   Staphylococcus aureus NEGATIVE NEGATIVE  Comment: (NOTE) The Xpert SA Assay (FDA approved for NASAL specimens in patients 60 years of age and older), is one component of a comprehensive surveillance program. It is not intended to diagnose infection nor to guide or monitor treatment. Performed at [facility], [place] [address] [place], [place] [address]   APTT     Status: None   Collection Time: 05/20/18  2:48 PM  Result Value Ref Range   aPTT 27 24 - 36 seconds    Comment: Performed at [facility] [address], [place], [place] [address]  CBC WITH DIFFERENTIAL     Status: Abnormal   Collection Time: 05/20/18  2:48 PM  Result Value Ref Range   WBC 11.1 (H) 4.0 - 10.5 K/uL   RBC 4.95 4.22 - 5.81 MIL/uL   Hemoglobin 15.3 13.0 - 17.0 g/dL   HCT 43.4 39.0 - 52.0 %   MCV 87.7 80.0 - 100.0 fL   MCH 30.9 26.0 - 34.0 pg   MCHC 35.3 30.0 - 36.0 g/dL   RDW 12.4 11.5 - 15.5 %   Platelets 186 150 - 400 K/uL   nRBC 0.0 0.0 - 0.2 %   Neutrophils Relative % 78 %   Neutro Abs 8.6 (H) 1.7 - 7.7 K/uL   Lymphocytes Relative 14 %   Lymphs Abs 1.5 0.7 - 4.0 K/uL   Monocytes Relative 6 %   Monocytes Absolute 0.7 0.1 - 1.0 K/uL   Eosinophils Relative 1 %   Eosinophils Absolute 0.1 0.0 - 0.5 K/uL   Basophils Relative 1 %   Basophils Absolute 0.1 0.0 - 0.1 K/uL   Immature Granulocytes 0 %   Abs Immature Granulocytes 0.03  0.00 - 0.07 K/uL    Comment: Performed at [facility] [address], [place], [place] [address]  Comprehensive metabolic panel     Status: Abnormal   Collection Time: 05/20/18  2:48 PM  Result Value Ref Range   Sodium 138 135 - 145 mmol/L   Potassium 3.4 (L) 3.5 - 5.1 mmol/L   Chloride 102 98 - 111 mmol/L   CO2 27 22 - 32 mmol/L   Glucose, Bld 108 (H) 70 - 99 mg/dL   BUN 11 8 - 23 mg/dL   Creatinine, Ser 0.77 0.61 - 1.24 mg/dL   Calcium 9.3 8.9 - 10.3 mg/dL   Total Protein 6.6 6.5 - 8.1 g/dL   Albumin 4.0 3.5 - 5.0 g/dL   AST 30 15 - 41 U/L   ALT 27 0 - 44 U/L   Alkaline Phosphatase 85 38 - 126 U/L   Total Bilirubin 2.1 (H) 0.3 - 1.2 mg/dL   GFR calc non Af Amer >60 >60 mL/min   GFR calc Af Amer >60 >60 mL/min    Comment: (NOTE) The eGFR has been calculated using the CKD EPI equation. This calculation has not been validated in all clinical situations. eGFR's persistently <60 mL/min signify possible Chronic Kidney Disease.    Anion gap 9 5 - 15    Comment: Performed at [facility] [address] [place], [place] [address]  Protime-INR     Status: None   Collection Time: 05/20/18  2:48 PM  Result Value Ref Range   Prothrombin Time 12.5 11.4 - 15.2 seconds   INR 0.94     Comment: Performed at [facility] [address], [place], [place] [address]  Type and screen Order type and screen if day of  surgery is less than 15 days from draw of preadmission visit or order morning of surgery if day of surgery is greater than 6 days from preadmission visit.     Status: None   Collection Time: 05/20/18  2:55 PM  Result Value Ref Range   ABO/RH(D) O POS    Antibody Screen NEG    Sample Expiration 06/03/2018    Extend sample reason      NO TRANSFUSIONS OR PREGNANCY IN THE PAST 3 MONTHS Performed at [facility], [place] [address] [place], [place] [address]   ABO/Rh     Status: None   Collection Time: 05/20/18  2:55 PM  Result Value Ref Range   ABO/RH(D)       O POS Performed at [facility] [address] [place], [place] [address]     Estimated body mass index is 30.26 kg/m as calculated from the following:   Height as of this encounter: 5' 10.5" (1.791 m).   Weight as of this encounter: 97 kg.   Imaging Review Plain radiographs demonstrate severe degenerative joint disease of the left hip(s). The bone quality appears to be good for age and reported activity level.    Preoperative templating of the joint replacement has been completed, documented, and submitted to the Operating Room personnel in order to optimize intra-operative equipment management.     Assessment/Plan:  End stage arthritis, left hip(s)  The patient history, physical examination, clinical judgement of the provider and imaging studies are consistent with end stage degenerative joint disease of the left hip(s) and total hip arthroplasty is deemed medically necessary. The treatment options including medical management, injection therapy, arthroscopy and arthroplasty were discussed at length. The risks and benefits of total hip arthroplasty were presented and reviewed. The risks due to aseptic loosening, infection, stiffness, dislocation/subluxation,  thromboembolic complications and other imponderables were discussed.  The patient acknowledged the explanation, agreed to proceed with the plan and consent was signed. Patient is being admitted for inpatient treatment for surgery, pain control, PT, OT, prophylactic antibiotics, VTE prophylaxis, progressive ambulation and ADL's and discharge planning.The patient is planning to be discharged home with home health services

## 2018-05-27 ENCOUNTER — Encounter (HOSPITAL_COMMUNITY): Payer: Self-pay | Admitting: Orthopedic Surgery

## 2018-05-27 LAB — CBC
HEMATOCRIT: 36.5 % — AB (ref 39.0–52.0)
HEMOGLOBIN: 13 g/dL (ref 13.0–17.0)
MCH: 31 pg (ref 26.0–34.0)
MCHC: 35.6 g/dL (ref 30.0–36.0)
MCV: 86.9 fL (ref 80.0–100.0)
Platelets: 189 10*3/uL (ref 150–400)
RBC: 4.2 MIL/uL — AB (ref 4.22–5.81)
RDW: 12.4 % (ref 11.5–15.5)
WBC: 12 10*3/uL — ABNORMAL HIGH (ref 4.0–10.5)
nRBC: 0 % (ref 0.0–0.2)

## 2018-05-27 NOTE — Op Note (Signed)
NAME: [name] [name], [name] [name] MEDICAL RECORD DX:[id number] ACCOUNT [id number] DATE OF BIRTH:1950-12-09 FACILITY: [facility] LOCATION: [facility] PHYSICIAN:Kacey Dysert Maudie Mercury, MD  OPERATIVE REPORT  DATE OF PROCEDURE:  05/26/2018  PREOPERATIVE DIAGNOSIS:  End-stage degenerative joint disease, left hip.  POSTOPERATIVE DIAGNOSIS:  End-stage degenerative joint disease, left hip.  PROCEDURE: 1. Left total hip replacement with a Corail size 12 stem, 54 mm Pinnacle cup porous coated, a 36+4 neutral liner, and a 36+1.5 delta ceramic hip ball. 2.interpretation of multiple intraoperative fluoroscopic images  SURGEON:  Dorna Leitz, MD  ASSISTANT:  Gaspar Skeeters PA-C, who was present for the entire case and assisted with retraction and closing to minimize OR time.  ANESTHESIA:  Spinal.  BRIEF HISTORY:  The patient is a 67 year old male with a long history significant complaints of left hip pain.  He has been treated conservatively for a long period of time.  X-ray showed severe end-stage degenerative joint disease with bone-on-bone  change.  After failure of all conservative care, he is taken to the operating room for left total hip replacement.  DESCRIPTION OF PROCEDURE:  The patient was taken to the operating room after adequate anesthesia was obtained with a spinal anesthetic, the patient was placed supine on the operating table.  He then moved onto the [place] bed.  All bony prominences well  padded.  Attention turned to the left hip where after routine prep and drape, an incision was made for exposure of the left hip.  Subcutaneous tissues down to the level of the tensor fascia.  The fascia was opened.  The vastus was retracted laterally.   The vessels were cauterized.  The capsule was opened and tagged and retractors were put in place above and below the neck.  A provisional neck cut is made and the head removed.  Retractors put in place above and below the acetabulum.  Once that was done,  the acetabulum was  sequentially reamed to a level of 53 mm.  Fluoroscopic imaging was used to assess the size and rotation and then the final cup was hammered into place a 54 mm Pinnacle cup porous coated.  Excellent alignment was achieved with the cup  and once that was done, the +4 neutral liner was placed.  Attention was then turned to the stem side where the hip was externally rotated, extended and adducted and the canal was opened followed by sequential rasping up to a level of 12.  A calcar planer  was used.  The +0 was checked and perfect leg length was achieved.  We went up to a size 12 stem.  It was opened and placed and then a +0 ball given that the previous imaging had shown to be perfect.  At this point, the capsule was closed with #1  Vicryl.  The tensor fascia was closed with 0 Vicryl running.  The skin was closed with 0 and 2-0 Vicryl and 3-0 Monocryl subcuticular.  Benzoin and Steri-Strips were applied.  Sterile compressive dressing was applied and the patient was taken to recovery  was noted to be in satisfactory condition.  Estimated blood loss for procedure was 250 mL but the final be gotten from the anesthetic record.  Of note, the multiple intraoperative fluoroscopic images were taken and interpreted by me.  Gaspar Skeeters  assisted throughout the case, was present for the entire case and assisted with retraction as well as closing to minimize OR time.  TN/NUANCE  D:05/26/2018 T:05/27/2018 JOB:[id number]

## 2018-05-27 NOTE — Plan of Care (Signed)
  Problem: Activity: Goal: Risk for activity intolerance will decrease Outcome: Progressing   Problem: Coping: Goal: Level of anxiety will decrease Outcome: Progressing   Problem: Pain Managment: Goal: General experience of comfort will improve Outcome: Progressing   Problem: Safety: Goal: Ability to remain free from injury will improve Outcome: Progressing   Problem: Skin Integrity: Goal: Risk for impaired skin integrity will decrease Outcome: Progressing   

## 2018-05-27 NOTE — Discharge Summary (Signed)
Patient ID: [name] [name] MRN: [id number] DOB/AGE: [birth year] 67 y.o.  Admit date: 05/26/2018 Discharge date: 05/27/2018  Admission Diagnoses:  Principal Problem:   Primary osteoarthritis of left hip   Discharge Diagnoses:  Same  Past Medical History:  Diagnosis Date  . Arthritis   . Barrett's esophagus    sees Dr. Delfin Edis   . BPH with urinary obstruction   . Complication of anesthesia    Urinary retention. "fights it"- is what he was told. 1990's - vomitted and aspirated in surgery  . Diverticulosis   . Esophageal dysmotility   . GERD (gastroesophageal reflux disease)    not since Nessen Fundopliation  . History of hiatal hernia   . History of kidney stones    15 in 2015- last one was in Oct 2015- takes HCTZ to help prevent  . Hyperlipidemia   . Insomnia   . Lyme disease 2013  . Parkinson's disease [facility])    sees Dr. Wells Guiles Tat   . RMSF [facility] spotted fever) 2012    Surgeries: Procedure(s):Left TOTAL HIP ARTHROPLASTY ANTERIOR APPROACH on 05/26/2018   Discharged Condition: Improved  Hospital Course: DARTANION TEO is an 67 y.o. male who was admitted 05/26/2018 for operative treatment ofPrimary osteoarthritis of left hip. Patient has severe unremitting pain that affects sleep, daily activities, and work/hobbies. After pre-op clearance the patient was taken to the operating room on 05/26/2018 and underwent  Procedure(s):Left TOTAL HIP ARTHROPLASTY ANTERIOR APPROACH.    Patient was given perioperative antibiotics:  Anti-infectives (From admission, onward)   Start     Dose/Rate Route Frequency Ordered Stop   05/26/18 1800  ceFAZolin (ANCEF) IVPB 2g/100 mL premix     2 g 200 mL/hr over 30 Minutes Intravenous Every 6 hours 05/26/18 1601 05/27/18 0151   05/26/18 0945  clindamycin (CLEOCIN) IVPB 900 mg     900 mg 100 mL/hr over 30 Minutes Intravenous On call to O.R. 05/26/18 0943 05/26/18 1253       Patient was given sequential compression  devices, early ambulation, and chemoprophylaxis to prevent DVT.  Patient benefited maximally from hospital stay and there were no complications.    Recent vital signs:  Patient Vitals for the past 24 hrs:  BP Temp Temp src Pulse Resp SpO2 Height Weight  05/27/18 0905 112/74 98.2 F (36.8 C) Oral (!) 110 16 94 % - -  05/27/18 0549 114/72 98 F (36.7 C) Oral 87 16 93 % - -  05/27/18 0042 136/77 98.4 F (36.9 C) Oral (!) 101 16 94 % - -  05/26/18 1609 110/69 (!) 97.5 F (36.4 C) Oral 72 14 94 % - -  05/26/18 1545 - - - 73 18 96 % - -  05/26/18 1530 - - - 74 14 95 % - -  05/26/18 1515 - - - 73 19 95 % - -  05/26/18 1511 102/67 - - 70 13 96 % - -  05/26/18 1500 - - - 88 (!) 22 95 % - -  05/26/18 1445 - - - 78 (!) 21 93 % - -  05/26/18 1439 95/73 - - - - - - -  05/26/18 1437 (!) 86/65 - - 86 13 97 % - -  05/26/18 1430 - - - 81 (!) 22 93 % - -  05/26/18 1423 95/60 97.9 F (36.6 C) - 83 18 95 % - -  05/26/18 0948 129/82 98.3 F (36.8 C) Oral (!) 101 18 95 % 5' 10.5" (1.791 m) 97  kg     Recent laboratory studies:  Recent Labs    05/27/18 0205  WBC 12.0*  HGB 13.0  HCT 36.5*  PLT 189     Discharge Medications:   Allergies as of 05/27/2018      Reactions   Penicillins Rash, Other (See Comments)   Has patient had a PCN reaction causing immediate rash, facial/tongue/throat swelling, SOB or lightheadedness with hypotension: No Has patient had a PCN reaction causing severe rash involving mucus membranes or skin necrosis: No Has patient had a PCN reaction that required hospitalization: No Has patient had a PCN reaction occurring within the last 10 years: No If all of the above answers are "NO", then may proceed with Cephalosporin use.      Medication List    STOP taking these medications   ibuprofen 800 MG tablet Commonly known as:  ADVIL,MOTRIN   traMADol 50 MG tablet Commonly known as:  ULTRAM     TAKE these medications   aspirin EC 325 MG tablet Take 1 tablet (325  mg total) by mouth 2 (two) times daily after a meal. Take x 1 month post op to decrease risk of blood clots.   atorvastatin 20 MG tablet Commonly known as:  LIPITOR TAKE 1 TABLET DAILY   docusate sodium 100 MG capsule Commonly known as:  COLACE Take 1 capsule (100 mg total) by mouth 2 (two) times daily.   finasteride 5 MG tablet Commonly known as:  PROSCAR Take 1 tablet (5 mg total) by mouth daily.   hydrochlorothiazide 12.5 MG capsule Commonly known as:  MICROZIDE TAKE 1 CAPSULE DAILY   MIRAPEX ER 1.5 MG Tb24 Generic drug:  Pramipexole Dihydrochloride TAKE 1 TABLET DAILY What changed:  how much to take   oxyCODONE-acetaminophen 5-325 MG tablet Commonly known as:  PERCOCET/ROXICET Take 1-2 tablets by mouth every 6 (six) hours as needed for severe pain.   polyethylene glycol packet Commonly known as:  MIRALAX / GLYCOLAX Take 17 g by mouth daily as needed for moderate constipation.   tamsulosin 0.4 MG Caps capsule Commonly known as:  FLOMAX TAKE 1 CAPSULE DAILY   tiZANidine 2 MG tablet Commonly known as:  ZANAFLEX Take 1 tablet (2 mg total) by mouth every 8 (eight) hours as needed for muscle spasms.   traZODone 50 MG tablet Commonly known as:  DESYREL Take 1 tablet (50 mg total) by mouth at bedtime as needed for sleep.            Discharge Care Instructions  (From admission, onward)         Start     Ordered   05/27/18 0000  Weight bearing as tolerated    Question Answer Comment  Laterality left   Extremity Lower      05/27/18 0924          Diagnostic Studies: Dg Chest 2 View  Result Date: 05/21/2018 CLINICAL DATA:  Preoperative exam.  Hip replacement. EXAM: CHEST - 2 VIEW COMPARISON:  06/09/2012. FINDINGS: Mediastinum and hilar structures normal. Heart size normal. Mild left base subsegmental atelectasis. No pleural effusion or pneumothorax. Stable elevation left hemidiaphragm. Right shoulder replacement. IMPRESSION: Mild left base subsegmental  atelectasis. Electronically Signed   By: Marcello Moores  Register   On: 05/21/2018 08:22   Dg C-arm 1-60 Min  Result Date: 05/26/2018 CLINICAL DATA:  Status post left anterior approach hip prosthesis placement. EXAM: DG C-ARM 61-120 MIN; OPERATIVE LEFT HIP WITH PELVIS COMPARISON:  None. FINDINGS: Two fluoro spot images are submitted.  Reported fluoro time is 18 seconds. There is a prosthetic left hip joint in place. Radiographic positioning of the prosthetic components is good. The interface with the native bone appears normal. IMPRESSION: No immediate postprocedure complication following anterior approach left total hip joint prosthesis placement. Electronically Signed   By: David  Martinique M.D.   On: 05/26/2018 14:05   Dg Hip Operative Unilat W Or W/o Pelvis Left  Result Date: 05/26/2018 CLINICAL DATA:  Status post left anterior approach hip prosthesis placement. EXAM: DG C-ARM 61-120 MIN; OPERATIVE LEFT HIP WITH PELVIS COMPARISON:  None. FINDINGS: Two fluoro spot images are submitted. Reported fluoro time is 18 seconds. There is a prosthetic left hip joint in place. Radiographic positioning of the prosthetic components is good. The interface with the native bone appears normal. IMPRESSION: No immediate postprocedure complication following anterior approach left total hip joint prosthesis placement. Electronically Signed   By: David  Martinique M.D.   On: 05/26/2018 14:05    Disposition: Discharge disposition: 01-Home or Self Care       Discharge Instructions    Call MD / Call 911   Complete by:  As directed    If you experience chest pain or shortness of breath, CALL 911 and be transported to the hospital emergency room.  If you develope a fever above 101 F, pus (white drainage) or increased drainage or redness at the wound, or calf pain, call your surgeon's office.   Diet general   Complete by:  As directed    Increase activity slowly as tolerated   Complete by:  As directed    Weight bearing as  tolerated   Complete by:  As directed    Laterality:  left   Extremity:  Lower      Follow-up Information    Dorna Leitz, MD. Go on 06/09/2018.   Specialty:  Orthopedic Surgery Why:  Your appointment has been set for 10:15 Contact information: [place] [address] [place]. Go on 06/09/2018.   Why:  You are scheduled to start OPPT at 3:00. Please arrive a few minutes early to complete paperwork   Contact information: [place] [address] [phone]        [facility] Follow up.   Specialty:  [place] Why:  HHPT will see you for 5 visits prior to starting OPPT  Contact information: [place] [address] [phone]            Signed: Erlene Senters 05/27/2018, 9:24 AM

## 2018-05-27 NOTE — Progress Notes (Signed)
Subjective: 1 Day Post-Op Procedure(s) (LRB): TOTAL HIP ARTHROPLASTY ANTERIOR APPROACH (Left) Patient reports pain as mild. Taking by mouth and voiding okay.   Has been up ambulating in the hall.  Objective: Vital signs in last 24 hours: Temp:  [97.5 F (36.4 C)-98.4 F (36.9 C)] 98 F (36.7 C) (11/12 0549) Pulse Rate:  [70-101] 87 (11/12 0549) Resp:  [13-22] 16 (11/12 0549) BP: (86-136)/(60-82) 114/72 (11/12 0549) SpO2:  [93 %-97 %] 93 % (11/12 0549) Weight:  [97 kg] 97 kg (11/11 0948)  Intake/Output from previous day: 11/11 0701 - 11/12 0700 In: 2400 [P.O.:680; I.V.:1420; IV Piggyback:300] Out: 1050 [Urine:700; Blood:350] Intake/Output this shift: No intake/output data recorded.  Recent Labs    05/27/18 0205  HGB 13.0   Recent Labs    05/27/18 0205  WBC 12.0*  RBC 4.20*  HCT 36.5*  PLT 189   No results for input(s): NA, K, CL, CO2, BUN, CREATININE, GLUCOSE, CALCIUM in the last 72 hours. No results for input(s): LABPT, INR in the last 72 hours. Left hip exam: Neurovascular intact Sensation intact distally Intact pulses distally Dorsiflexion/Plantar flexion intact Incision: dressing C/D/I Compartment soft   Assessment/Plan: 1 Day Post-Op Procedure(s) (LRB): TOTAL HIP ARTHROPLASTY ANTERIOR APPROACH (Left)  Plan: Up with physical therapy. Weight-bear as tolerated on left without hip precautions. Aspirin 325 mg enteric-coated twice daily 1 month for DVT prophylaxis. If does well with physical therapy will discharge home after PT. Follow-up with Dr. Berenice Primas in 10-14 days.     Erlene Senters 05/27/2018, 8:50 AM

## 2018-05-27 NOTE — Care Management Note (Signed)
Case Management Note  Patient Details  Name: [name] [name] MRN: [id number] Date of Birth: 12/23/1950  Subjective/Objective:    S/p Left Hip                Action/Plan: NCM spoke to pt and offered choice for Home Health. Pt agreeable to [facility]. Pt states she has handles on his toilets and shower seat. Contacted [facility] for RW for home that will be delivered to room prior to dc.    Expected Discharge Date:  05/27/18               Expected Discharge Plan:  [place]  In-House Referral:  NA  Discharge planning Services  CM Consult  Post Acute Care Choice:  Home Health Choice offered to:  Patient  DME Arranged:  [place] rolling DME Agency:  [employer]  HH Arranged:  PT [place] Agency:  [facility] (formerly [employer])  Status of Service:  Completed, signed off  If discussed at [employer], dates discussed:    Additional Comments:  Erenest Rasher, RN 05/27/2018, 10:24 AM

## 2018-05-27 NOTE — Anesthesia Postprocedure Evaluation (Signed)
Anesthesia Post Note  Patient: [name] [name]  Procedure(s) Performed: TOTAL HIP ARTHROPLASTY ANTERIOR APPROACH (Left Hip)     Patient location during evaluation: PACU Anesthesia Type: Spinal Level of consciousness: oriented and awake and alert Pain management: pain level controlled Vital Signs Assessment: post-procedure vital signs reviewed and stable Respiratory status: spontaneous breathing, respiratory function stable and patient connected to nasal cannula oxygen Cardiovascular status: blood pressure returned to baseline and stable Postop Assessment: no headache, no backache and no apparent nausea or vomiting Anesthetic complications: no    Last Vitals:  Vitals:   05/27/18 0549 05/27/18 0905  BP: 114/72 112/74  Pulse: 87 (!) 110  Resp: 16 16  Temp: 36.7 C 36.8 C  SpO2: 93% 94%    Last Pain:  Vitals:   05/27/18 1008  TempSrc:   PainSc: 0-No pain                 Truong Delcastillo L Ved Martos

## 2018-05-27 NOTE — Progress Notes (Signed)
Physical Therapy Treatment Patient Details Name: [name] [name] MRN: [id number] DOB: [birth year] Today's Date: 05/27/2018    History of Present Illness Pt is a 67 y/o male s/p elective L THA, direct anterior approach. PMH includes Parkinson's disease and R TSA.     PT Comments    Patient is making good progress with PT.  From a mobility standpoint anticipate patient will be ready for DC home when medically ready.    Follow Up Recommendations  Follow surgeon's recommendation for DC plan and follow-up therapies;Supervision for mobility/OOB     Equipment Recommendations  Rolling walker with 5" wheels;3in1 (PT)    Recommendations for Other Services       Precautions / Restrictions Precautions Precautions: None Restrictions Weight Bearing Restrictions: Yes LLE Weight Bearing: Weight bearing as tolerated    Mobility  Bed Mobility Overal bed mobility: Modified Independent             General bed mobility comments: increased time and effort   Transfers Overall transfer level: Needs assistance Equipment used: Rolling walker (2 wheeled) Transfers: Sit to/from Stand Sit to Stand: Supervision         General transfer comment: supervision for safety  Ambulation/Gait Ambulation/Gait assistance: Supervision Gait Distance (Feet): 350 Feet Assistive device: Rolling walker (2 wheeled) Gait Pattern/deviations: Step-through pattern;Decreased stride length;Trunk flexed Gait velocity: Decreased    General Gait Details: cues for posture and proximity to RW    Stairs Stairs: Yes Stairs assistance: Min guard Stair Management: One rail Right;Step to pattern;Forwards;Sideways Number of Stairs: (2 steps X 2 trials) General stair comments: cues for sequencing and technique    Wheelchair Mobility    Modified Rankin (Stroke Patients Only)       Balance Overall balance assessment: Needs assistance Sitting-balance support: No upper extremity supported;Feet  supported Sitting balance-Leahy Scale: Good     Standing balance support: Bilateral upper extremity supported;During functional activity Standing balance-Leahy Scale: Poor Standing balance comment: pt able to static stand without UE support                            Cognition Arousal/Alertness: Awake/alert Behavior During Therapy: WFL for tasks assessed/performed Overall Cognitive Status: Within Functional Limits for tasks assessed                                        Exercises Total Joint Exercises Hip ABduction/ADduction: Strengthening;Left;10 reps;Standing Knee Flexion: Strengthening;Left;10 reps;Standing Marching in Standing: Strengthening;Both;10 reps;Standing Standing Hip Extension: AROM;Left;10 reps;Standing    General Comments General comments (skin integrity, edema, etc.): HEP handout reviewed      Pertinent Vitals/Pain Pain Assessment: Faces Faces Pain Scale: Hurts a little bit Pain Location: L hip  Pain Descriptors / Indicators: Sore Pain Intervention(s): Limited activity within patient's tolerance;Monitored during session;Repositioned    Home Living                      Prior Function            PT Goals (current goals can now be found in the care plan section) Acute Rehab PT Goals Patient Stated Goal: to go home ASAP  Progress towards PT goals: Progressing toward goals    Frequency    7X/week      PT Plan Current plan remains appropriate    Co-evaluation  AM-PAC PT "6 Clicks" Daily Activity  Outcome Measure  Difficulty turning over in bed (including adjusting bedclothes, sheets and blankets)?: A Little Difficulty moving from lying on back to sitting on the side of the bed? : A Little Difficulty sitting down on and standing up from a chair with arms (e.g., wheelchair, bedside commode, etc,.)?: A Little Help needed moving to and from a bed to chair (including a wheelchair)?: A  Little Help needed walking in hospital room?: A Little Help needed climbing 3-5 steps with a railing? : A Little 6 Click Score: 18    End of Session Equipment Utilized During Treatment: Gait belt Activity Tolerance: Patient tolerated treatment well Patient left: in chair;with call bell/phone within reach Nurse Communication: Mobility status PT Visit Diagnosis: Other abnormalities of gait and mobility (R26.89);Pain Pain - Right/Left: Left Pain - part of body: Hip     Time: 8372-9021 PT Time Calculation (min) (ACUTE ONLY): 24 min  Charges:  $Gait Training: 8-22 mins $Therapeutic Exercise: 8-22 mins                     Earney Navy, PTA Acute Rehabilitation Services Pager: [phone] Office: [phone]     Darliss Cheney 05/27/2018, 8:49 AM

## 2018-05-28 DIAGNOSIS — E785 Hyperlipidemia, unspecified: Secondary | ICD-10-CM | POA: Diagnosis not present

## 2018-05-28 DIAGNOSIS — Z9181 History of falling: Secondary | ICD-10-CM | POA: Diagnosis not present

## 2018-05-28 DIAGNOSIS — Z7982 Long term (current) use of aspirin: Secondary | ICD-10-CM | POA: Diagnosis not present

## 2018-05-28 DIAGNOSIS — K5730 Diverticulosis of large intestine without perforation or abscess without bleeding: Secondary | ICD-10-CM | POA: Diagnosis not present

## 2018-05-28 DIAGNOSIS — N138 Other obstructive and reflux uropathy: Secondary | ICD-10-CM | POA: Diagnosis not present

## 2018-05-28 DIAGNOSIS — N401 Enlarged prostate with lower urinary tract symptoms: Secondary | ICD-10-CM | POA: Diagnosis not present

## 2018-05-28 DIAGNOSIS — Z96642 Presence of left artificial hip joint: Secondary | ICD-10-CM | POA: Diagnosis not present

## 2018-05-28 DIAGNOSIS — Z471 Aftercare following joint replacement surgery: Secondary | ICD-10-CM | POA: Diagnosis not present

## 2018-05-30 DIAGNOSIS — N138 Other obstructive and reflux uropathy: Secondary | ICD-10-CM | POA: Diagnosis not present

## 2018-05-30 DIAGNOSIS — E785 Hyperlipidemia, unspecified: Secondary | ICD-10-CM | POA: Diagnosis not present

## 2018-05-30 DIAGNOSIS — N401 Enlarged prostate with lower urinary tract symptoms: Secondary | ICD-10-CM | POA: Diagnosis not present

## 2018-05-30 DIAGNOSIS — Z471 Aftercare following joint replacement surgery: Secondary | ICD-10-CM | POA: Diagnosis not present

## 2018-05-30 DIAGNOSIS — Z7982 Long term (current) use of aspirin: Secondary | ICD-10-CM | POA: Diagnosis not present

## 2018-05-30 DIAGNOSIS — K5730 Diverticulosis of large intestine without perforation or abscess without bleeding: Secondary | ICD-10-CM | POA: Diagnosis not present

## 2018-06-02 DIAGNOSIS — K5730 Diverticulosis of large intestine without perforation or abscess without bleeding: Secondary | ICD-10-CM | POA: Diagnosis not present

## 2018-06-02 DIAGNOSIS — N138 Other obstructive and reflux uropathy: Secondary | ICD-10-CM | POA: Diagnosis not present

## 2018-06-02 DIAGNOSIS — N401 Enlarged prostate with lower urinary tract symptoms: Secondary | ICD-10-CM | POA: Diagnosis not present

## 2018-06-02 DIAGNOSIS — Z471 Aftercare following joint replacement surgery: Secondary | ICD-10-CM | POA: Diagnosis not present

## 2018-06-02 DIAGNOSIS — E785 Hyperlipidemia, unspecified: Secondary | ICD-10-CM | POA: Diagnosis not present

## 2018-06-02 DIAGNOSIS — Z7982 Long term (current) use of aspirin: Secondary | ICD-10-CM | POA: Diagnosis not present

## 2018-06-03 ENCOUNTER — Encounter: Payer: Self-pay | Admitting: Family Medicine

## 2018-06-04 DIAGNOSIS — Z471 Aftercare following joint replacement surgery: Secondary | ICD-10-CM | POA: Diagnosis not present

## 2018-06-04 DIAGNOSIS — Z7982 Long term (current) use of aspirin: Secondary | ICD-10-CM | POA: Diagnosis not present

## 2018-06-04 DIAGNOSIS — N138 Other obstructive and reflux uropathy: Secondary | ICD-10-CM | POA: Diagnosis not present

## 2018-06-04 DIAGNOSIS — E785 Hyperlipidemia, unspecified: Secondary | ICD-10-CM | POA: Diagnosis not present

## 2018-06-04 DIAGNOSIS — K5730 Diverticulosis of large intestine without perforation or abscess without bleeding: Secondary | ICD-10-CM | POA: Diagnosis not present

## 2018-06-04 DIAGNOSIS — N401 Enlarged prostate with lower urinary tract symptoms: Secondary | ICD-10-CM | POA: Diagnosis not present

## 2018-06-06 DIAGNOSIS — K5730 Diverticulosis of large intestine without perforation or abscess without bleeding: Secondary | ICD-10-CM | POA: Diagnosis not present

## 2018-06-06 DIAGNOSIS — Z471 Aftercare following joint replacement surgery: Secondary | ICD-10-CM | POA: Diagnosis not present

## 2018-06-06 DIAGNOSIS — Z7982 Long term (current) use of aspirin: Secondary | ICD-10-CM | POA: Diagnosis not present

## 2018-06-06 DIAGNOSIS — N401 Enlarged prostate with lower urinary tract symptoms: Secondary | ICD-10-CM | POA: Diagnosis not present

## 2018-06-06 DIAGNOSIS — N138 Other obstructive and reflux uropathy: Secondary | ICD-10-CM | POA: Diagnosis not present

## 2018-06-06 DIAGNOSIS — E785 Hyperlipidemia, unspecified: Secondary | ICD-10-CM | POA: Diagnosis not present

## 2018-06-10 DIAGNOSIS — M1612 Unilateral primary osteoarthritis, left hip: Secondary | ICD-10-CM | POA: Diagnosis not present

## 2018-07-07 DIAGNOSIS — Z9889 Other specified postprocedural states: Secondary | ICD-10-CM | POA: Diagnosis not present

## 2018-07-30 DIAGNOSIS — M19071 Primary osteoarthritis, right ankle and foot: Secondary | ICD-10-CM | POA: Diagnosis not present

## 2018-07-30 DIAGNOSIS — M19072 Primary osteoarthritis, left ankle and foot: Secondary | ICD-10-CM | POA: Diagnosis not present

## 2018-09-09 DIAGNOSIS — M1711 Unilateral primary osteoarthritis, right knee: Secondary | ICD-10-CM | POA: Diagnosis not present

## 2018-10-01 ENCOUNTER — Ambulatory Visit: Payer: Medicare Other | Admitting: Family Medicine

## 2018-10-01 ENCOUNTER — Encounter: Payer: Medicare Other | Admitting: Family Medicine

## 2018-10-10 ENCOUNTER — Other Ambulatory Visit: Payer: Self-pay | Admitting: Family Medicine

## 2018-10-20 ENCOUNTER — Encounter: Payer: Self-pay | Admitting: Family Medicine

## 2018-10-20 ENCOUNTER — Ambulatory Visit (INDEPENDENT_AMBULATORY_CARE_PROVIDER_SITE_OTHER): Payer: Medicare Other | Admitting: Family Medicine

## 2018-10-20 ENCOUNTER — Other Ambulatory Visit: Payer: Self-pay

## 2018-10-20 DIAGNOSIS — N401 Enlarged prostate with lower urinary tract symptoms: Secondary | ICD-10-CM | POA: Diagnosis not present

## 2018-10-20 DIAGNOSIS — M1990 Unspecified osteoarthritis, unspecified site: Secondary | ICD-10-CM | POA: Insufficient documentation

## 2018-10-20 DIAGNOSIS — M150 Primary generalized (osteo)arthritis: Secondary | ICD-10-CM | POA: Diagnosis not present

## 2018-10-20 DIAGNOSIS — K219 Gastro-esophageal reflux disease without esophagitis: Secondary | ICD-10-CM | POA: Diagnosis not present

## 2018-10-20 DIAGNOSIS — N138 Other obstructive and reflux uropathy: Secondary | ICD-10-CM

## 2018-10-20 DIAGNOSIS — M159 Polyosteoarthritis, unspecified: Secondary | ICD-10-CM

## 2018-10-20 DIAGNOSIS — G20 Parkinson's disease: Secondary | ICD-10-CM | POA: Diagnosis not present

## 2018-10-20 MED ORDER — ATORVASTATIN CALCIUM 20 MG PO TABS: 20.0000 mg | ORAL_TABLET | Freq: Every day | ORAL | 3 refills | Status: DC

## 2018-10-20 MED ORDER — HYDROCHLOROTHIAZIDE 12.5 MG PO CAPS: 12.5000 mg | ORAL_CAPSULE | Freq: Every day | ORAL | 3 refills | Status: DC

## 2018-10-20 MED ORDER — TAMSULOSIN HCL 0.4 MG PO CAPS: 0.4000 mg | ORAL_CAPSULE | Freq: Every day | ORAL | 3 refills | Status: DC

## 2018-10-20 MED ORDER — MELOXICAM 15 MG PO TABS: 15.0000 mg | ORAL_TABLET | Freq: Every day | ORAL | 3 refills | Status: DC

## 2018-10-20 MED ORDER — FINASTERIDE 5 MG PO TABS: 5.0000 mg | ORAL_TABLET | Freq: Every day | ORAL | 3 refills | Status: DC

## 2018-10-20 MED ORDER — TRAMADOL HCL 50 MG PO TABS: 100.0000 mg | ORAL_TABLET | Freq: Four times a day (QID) | ORAL | 0 refills | Status: DC | PRN

## 2018-10-20 NOTE — Progress Notes (Signed)
Subjective:    Patient ID: [name] [name], male    DOB: 08-Jun-1951, 68 y.o.   MRN: [id number]  HPI Virtual Visit via Video Note  I connected with the patient on 10/20/18 at  9:00 AM EDT by a video enabled telemedicine application and verified that I am speaking with the correct person using two identifiers.  Location patient: home Location provider:work or home office Persons participating in the virtual visit: patient, provider  I discussed the limitations of evaluation and management by telemedicine and the availability of in person appointments. The patient expressed understanding and agreed to proceed.   HPI: We spoke about his medications, and he needs some refills. He had hip replacement surgery in November and this went very well. He has trouble with arthritis pain in many joints however, and he takes Ibuprofen as needed. This does not help much however. This also aggravates his GERD. Otherwise he exercises daily and this has kept his Parkninsons symptoms at [place] so far.    ROS: See pertinent positives and negatives per HPI.  Past Medical History:  Diagnosis Date  . Arthritis   . Barrett's esophagus    sees Dr. Delfin Edis   . BPH with urinary obstruction   . Complication of anesthesia    Urinary retention. "fights it"- is what he was told. 1990's - vomitted and aspirated in surgery  . Diverticulosis   . Esophageal dysmotility   . GERD (gastroesophageal reflux disease)    not since [facility] Fund[facility]  . History of hiatal hernia   . History of kidney stones    15 in 2015- last one was in Oct 2015- takes HCTZ to help prevent  . Hyperlipidemia   . Insomnia   . Lyme disease 2013  . Parkinson's disease [facility])    sees Dr. Wells Guiles Tat   . RMSF [facility] spotted fever) 2012    Past Surgical History:  Procedure Laterality Date  . CHOLECYSTECTOMY    . COLONOSCOPY  06-24-13   per Dr. Olevia Perches, no polyps, diveticulosis only, repeat in 10 yrs   .  ESOPHAGOGASTRODUODENOSCOPY  06-24-13   per Dr. Olevia Perches, no signs of Barretts, repeat in 5 yrs   . HERNIA REPAIR Right    right inguinal  . LASIK     bilateral  . lipomas removed     x10/ on arms and legs  . NISSEN FUNDOPLICATION  08-08-56   laparoscopic per Dr. Johnathan Hausen  . ORCHIOPEXY     right side at age 33   . TONSILLECTOMY AND ADENOIDECTOMY    . TOTAL HIP ARTHROPLASTY Left 05/26/2018   Procedure: TOTAL HIP ARTHROPLASTY ANTERIOR APPROACH;  Surgeon: Dorna Leitz, MD;  Location: [place];  Service: Orthopedics;  Laterality: Left;  . TOTAL SHOULDER ARTHROPLASTY Right 06/23/2015   Procedure: RIGHT TOTAL SHOULDER ARTHROPLASTY;  Surgeon: Justice Britain, MD;  Location: [place];  Service: Orthopedics;  Laterality: Right;    Family History  Problem Relation Age of Onset  . Arthritis Mother   . Esophageal cancer Father   . Lung cancer Maternal Grandfather   . Esophageal cancer Paternal Grandmother   . Lung cancer Paternal Grandfather   . Colon cancer Sister        questionable  . Esophageal cancer Sister   . Esophageal cancer Paternal Aunt        x 2  . Esophageal cancer Cousin   . Rectal cancer Neg Hx   . Stomach cancer Neg Hx  Current Outpatient Medications:  .  aspirin EC 325 MG tablet, Take 1 tablet (325 mg total) by mouth 2 (two) times daily after a meal. Take x 1 month post op to decrease risk of blood clots., Disp: 60 tablet, Rfl: 0 .  atorvastatin (LIPITOR) 20 MG tablet, TAKE 1 TABLET DAILY (Patient taking differently: Take 20 mg by mouth daily. ), Disp: 90 tablet, Rfl: 2 .  docusate sodium (COLACE) 100 MG capsule, Take 1 capsule (100 mg total) by mouth 2 (two) times daily., Disp: 30 capsule, Rfl: 0 .  finasteride (PROSCAR) 5 MG tablet, Take 1 tablet (5 mg total) by mouth daily., Disp: 90 tablet, Rfl: 3 .  hydrochlorothiazide (MICROZIDE) 12.5 MG capsule, TAKE 1 CAPSULE DAILY (Patient taking differently: Take 12.5 mg by mouth daily. ), Disp: 90 capsule, Rfl: 2 .  MIRAPEX ER  1.5 MG TB24, TAKE 1 TABLET DAILY (Patient taking differently: Take 1.5 mg by mouth daily. ), Disp: 90 tablet, Rfl: 1 .  polyethylene glycol (MIRALAX / GLYCOLAX) packet, Take 17 g by mouth daily as needed for moderate constipation. , Disp: , Rfl:  .  tamsulosin (FLOMAX) 0.4 MG CAPS capsule, TAKE 1 CAPSULE DAILY (Patient taking differently: Take 0.4 mg by mouth daily. ), Disp: 90 capsule, Rfl: 2 .  traMADol (ULTRAM) 50 MG tablet, , Disp: , Rfl:   EXAM:  VITALS per patient if applicable:  GENERAL: alert, oriented, appears well and in no acute distress  HEENT: atraumatic, conjunttiva clear, no obvious abnormalities on inspection of external nose and ears  NECK: normal movements of the head and neck  LUNGS: on inspection no signs of respiratory distress, breathing rate appears normal, no obvious gross SOB, gasping or wheezing  CV: no obvious cyanosis  MS: moves all visible extremities without noticeable abnormality  PSYCH/NEURO: pleasant and cooperative, no obvious depression or anxiety, speech and thought processing grossly intact  ASSESSMENT AND PLAN: For the GERD, he takes Prilosec as needed. For arthritis pain he will try Meloxicam 15 mg daily, and hopefully this will not bother the GERD too much. We will refill the other meds, and we paln to do a well exam in 3 months or so.  [name], MD  Discussed the following assessment and plan:  No diagnosis found.     I discussed the assessment and treatment plan with the patient. The patient was provided an opportunity to ask questions and all were answered. The patient agreed with the plan and demonstrated an understanding of the instructions.   The patient was advised to call back or seek an in-person evaluation if the symptoms worsen or if the condition fails to improve as anticipated.     Review of Systems     Objective:   Physical Exam        Assessment & Plan:

## 2018-10-21 ENCOUNTER — Ambulatory Visit: Payer: Medicare Other | Admitting: Neurology

## 2018-11-12 ENCOUNTER — Other Ambulatory Visit: Payer: Self-pay | Admitting: Neurology

## 2018-11-12 ENCOUNTER — Telehealth: Payer: Self-pay | Admitting: Neurology

## 2018-11-12 ENCOUNTER — Encounter: Payer: Self-pay | Admitting: *Deleted

## 2018-11-12 MED ORDER — PRAMIPEXOLE DIHYDROCHLORIDE ER 1.5 MG PO TB24: 1.5000 mg | ORAL_TABLET | Freq: Every day | ORAL | 0 refills | Status: DC

## 2018-11-12 NOTE — Telephone Encounter (Signed)
LVM--regarding medication Rx Mirapex 1.5mg  sent to [facility]

## 2018-11-12 NOTE — Telephone Encounter (Signed)
Let pt know that I sent refill

## 2018-11-12 NOTE — Telephone Encounter (Signed)
Patient called needing to get a refill on his Mirapex 1.5 MG. He uses [facility]. He would like a 90 day supply. His next appt is in June. Thanks

## 2018-11-26 ENCOUNTER — Other Ambulatory Visit: Payer: Self-pay | Admitting: *Deleted

## 2018-11-26 ENCOUNTER — Telehealth: Payer: Self-pay | Admitting: Neurology

## 2018-11-26 MED ORDER — PRAMIPEXOLE DIHYDROCHLORIDE ER 1.5 MG PO TB24: 1.5000 mg | ORAL_TABLET | Freq: Every day | ORAL | 0 refills | Status: DC

## 2018-11-26 NOTE — Telephone Encounter (Signed)
Patient is needing a 10 day supply of the mirapex 1.5 mg to [facility] on file. He said that this was recently called [facility] for him but they are taking a long time to get this to him and he is about out of medication. He didn't know if this would be possible just until he gets the one from [facility]. Thanks!

## 2018-11-26 NOTE — Telephone Encounter (Signed)
Rx sent in for a 10 day supply.

## 2018-12-02 ENCOUNTER — Ambulatory Visit: Payer: Medicare Other | Admitting: Family Medicine

## 2018-12-22 ENCOUNTER — Telehealth: Payer: Self-pay | Admitting: Family Medicine

## 2018-12-22 NOTE — Telephone Encounter (Signed)
Dr. Sarajane Jews please advise on refill of tramadol.

## 2018-12-22 NOTE — Telephone Encounter (Signed)
Copied from [place] [id number]. Topic: Quick Communication - Rx Refill/Question >> Dec 22, 2018  9:54 AM Leward Quan A wrote: Medication: traMADol (ULTRAM) 50 MG tablet  Has the patient contacted their pharmacy? Yes  (Agent: If no, request that the patient contact the pharmacy for the refill.) (Agent: If yes, when and what did the pharmacy advise?)  Preferred Pharmacy (with phone number or street name): [facility], [place] [phone] (Phone) [phone] (Fax)    Agent: Please be advised that RX refills may take up to 3 business days. We ask that you follow-up with your pharmacy.

## 2018-12-22 NOTE — Telephone Encounter (Signed)
Call in #[phone] with 5 rf 

## 2018-12-23 MED ORDER — TRAMADOL HCL 50 MG PO TABS: 100.0000 mg | ORAL_TABLET | Freq: Four times a day (QID) | ORAL | 5 refills | Status: DC | PRN

## 2018-12-23 NOTE — Telephone Encounter (Signed)
Refill called to the pharmacy and left on the VM 

## 2018-12-25 NOTE — Telephone Encounter (Signed)
Medication sent to [facility] and needs to be [facility] to [facility]. Please advise

## 2018-12-29 MED ORDER — TRAMADOL HCL 50 MG PO TABS: 100.0000 mg | ORAL_TABLET | Freq: Four times a day (QID) | ORAL | 5 refills | Status: DC | PRN

## 2018-12-29 NOTE — Addendum Note (Signed)
Addended by: Elie Confer on: 12/29/2018 03:35 PM   Modules accepted: Orders

## 2018-12-29 NOTE — Telephone Encounter (Signed)
rx has been printed and faxed to the mail order pharmacy.

## 2019-01-02 ENCOUNTER — Ambulatory Visit (INDEPENDENT_AMBULATORY_CARE_PROVIDER_SITE_OTHER): Payer: Medicare Other | Admitting: Gastroenterology

## 2019-01-02 ENCOUNTER — Other Ambulatory Visit: Payer: Self-pay

## 2019-01-02 DIAGNOSIS — K219 Gastro-esophageal reflux disease without esophagitis: Secondary | ICD-10-CM

## 2019-01-02 MED ORDER — OMEPRAZOLE 40 MG PO CPDR: 40.0000 mg | DELAYED_RELEASE_CAPSULE | Freq: Every day | ORAL | 11 refills | Status: DC

## 2019-01-02 NOTE — Patient Instructions (Addendum)
We will call in a new prescription.  Omeprazole 40 mg pills he will take 1 pill shortly before his breakfast meal every day.  Dispense 30 with 11 refills   He will also start taking Pepcid, famotidine 20 mg pills.  1 pill at bedtime every night   He will call to report on his response to this regimen in 3 to 4 weeks.  Normal BMI (Body Mass Index- based on height and weight) is between 19 and 25. Your BMI today is There is no height or weight on file to calculate BMI. Marland Kitchen Please consider follow up  regarding your BMI with your Primary Care Provider.  Thank you for entrusting me with your care and choosing [facility].  Dr Ardis Hughs

## 2019-01-02 NOTE — Progress Notes (Signed)
Review of pertinent gastrointestinal problems: 1.  Irregular, non-nodular Z line. EGD 2011, Dr. Delfin Edis. Biopsies showed intestinal metaplasia consistent with Barrett's esophagus. No dysplasia. EGD 2013, Dr. Delfin Edis. Biopsies showed intestinal metaplasia consistent with Barrett's esophagus no dysplasia EGD December 2014, Dr. Delfin Edis. Biopsies showed no intestinal metaplasia.  EGD 12/2016 Dr Ardis Hughs found irregular, non-nodular Z line.  Intact fundoplication with small HH.  Path showed NO intestinal metaplasia; given no intestinal metaplasia on 2018 or 2014 upper endoscopies I recommended no further surveillance procedures. 2. FH of esophageal cancer: Father, sister both died of esophageal cancer.  Cousin died from it.  Possible paternal GM with esophagus cancer. 3. GERD; He had nissen fundoplication: 1610.  Had terrible GERD prior to that but no need for routine anti acid meds since then.    This service was provided via virtual visit.   Only audio was used.  The patient was located at home.  I was located in my office.  The patient did consent to this virtual visit and is aware of possible charges through their insurance for this visit.  The patient is an established patient.  My certified medical assistant, Grace Bushy, contributed to this visit by contacting the patient by phone 1 or 2 business days prior to the appointment and also followed up on the recommendations I made after the visit.  Time spent on virtual visit: 22 minutes   HPI: This is a very pleasant 68 year old man whom I last saw about 67 years ago the time of an upper endoscopy.  See those results summarized above  In February he had lettuce related esophageal ?obstruction.  He had 'acid reflux' after that.  In may he had a 'case of vertigo' and felt the need to vomit but he could not vomit.    In past 3-4 weeks he had waterbrash at night twice.  Usually laying down at night he will have refluxing overnight.  Biggest  problem is really at night usually.  He had a hip replacement November 2019.  Has been relatively immobile or at least less mobile since then.  He is also been eating a lot more with the coronavirus restrictions, staying at home. He has gained 10 pounds since the hip replacement.  He is trying to work on the weight.  He tried OTC prilosec but it didn't help.  He tried his wife's nexium with some relief 40mg  pill two days ago.  Usually before breakfast.  No dysphagia except the lettuce months ago.  No change in 11 years overall.  Chief complaint is GERD  ROS: complete GI ROS as described in HPI, all other review negative.  Constitutional:  No unintentional weight loss   Past Medical History:  Diagnosis Date  . Arthritis   . Barrett's esophagus    sees Dr. Delfin Edis   . BPH with urinary obstruction   . Complication of anesthesia    Urinary retention. "fights it"- is what he was told. 1990's - vomitted and aspirated in surgery  . Diverticulosis   . Esophageal dysmotility   . GERD (gastroesophageal reflux disease)    not since Nessen Fundopliation  . History of hiatal hernia   . History of kidney stones    15 in 2015- last one was in Oct 2015- takes HCTZ to help prevent  . Hyperlipidemia   . Insomnia   . Lyme disease 2013  . Parkinson's disease [facility])    sees Dr. Wells Guiles [facility]   . RMS[facility]  spotted fever) 2012    Past Surgical History:  Procedure Laterality Date  . CHOLECYSTECTOMY    . COLONOSCOPY  06-24-13   per Dr. Olevia Perches, no polyps, diveticulosis only, repeat in 10 yrs   . ESOPHAGOGASTRODUODENOSCOPY  06-24-13   per Dr. Olevia Perches, no signs of Barretts, repeat in 5 yrs   . HERNIA REPAIR Right    right inguinal  . LASIK     bilateral  . lipomas removed     x10/ on arms and legs  . NISSEN FUNDOPLICATION  8-34-19   laparoscopic per Dr. Johnathan Hausen  . ORCHIOPEXY     right side at age 68   . TONSILLECTOMY AND ADENOIDECTOMY    . TOTAL HIP ARTHROPLASTY Left  05/26/2018   Procedure: TOTAL HIP ARTHROPLASTY ANTERIOR APPROACH;  Surgeon: Dorna Leitz, MD;  Location: [place];  Service: Orthopedics;  Laterality: Left;  . TOTAL SHOULDER ARTHROPLASTY Right 06/23/2015   Procedure: RIGHT TOTAL SHOULDER ARTHROPLASTY;  Surgeon: Justice Britain, MD;  Location: [place];  Service: Orthopedics;  Laterality: Right;    Current Outpatient Medications  Medication Sig Dispense Refill  . atorvastatin (LIPITOR) 20 MG tablet Take 1 tablet (20 mg total) by mouth daily. 90 tablet 3  . finasteride (PROSCAR) 5 MG tablet Take 1 tablet (5 mg total) by mouth daily. 90 tablet 3  . hydrochlorothiazide (MICROZIDE) 12.5 MG capsule Take 1 capsule (12.5 mg total) by mouth daily. 90 capsule 3  . polyethylene glycol (MIRALAX / GLYCOLAX) packet Take 17 g by mouth daily as needed for moderate constipation.     . Pramipexole Dihydrochloride (MIRAPEX ER) 1.5 MG TB24 Take 1 tablet (1.5 mg total) by mouth daily. 10 tablet 0  . tamsulosin (FLOMAX) 0.4 MG CAPS capsule Take 1 capsule (0.4 mg total) by mouth daily. 90 capsule 3  . traMADol (ULTRAM) 50 MG tablet Take 2 tablets (100 mg total) by mouth every 6 (six) hours as needed for moderate pain. 120 tablet 5   No current facility-administered medications for this visit.     Allergies as of 01/02/2019 - Review Complete 12/31/2018  Allergen Reaction Noted  . Penicillins Rash and Other (See Comments)     Family History  Problem Relation Age of Onset  . Arthritis Mother   . Cancer Mother   . Esophageal cancer Father   . Lung cancer Maternal Grandfather   . Esophageal cancer Paternal Grandmother   . Lung cancer Paternal Grandfather   . Colon cancer Sister        questionable  . Esophageal cancer Sister   . Esophageal cancer Paternal Aunt        x 2  . Esophageal cancer Cousin   . Rectal cancer Neg Hx   . Stomach cancer Neg Hx     Social History   Socioeconomic History  . Marital status: Married    Spouse name: Not on file  . Number  of children: Not on file  . Years of education: Not on file  . Highest education level: Not on file  Occupational History  . Not on file  Social Needs  . Financial resource strain: Not on file  . Food insecurity    Worry: Not on file    Inability: Not on file  . Transportation needs    Medical: Not on file    Non-medical: Not on file  Tobacco Use  . Smoking status: Never Smoker  . Smokeless tobacco: Never Used  Substance and Sexual Activity  . Alcohol use:  No    Alcohol/week: 0.0 standard drinks  . Drug use: No  . Sexual activity: Not on file  Lifestyle  . Physical activity    Days per week: Not on file    Minutes per session: Not on file  . Stress: Not on file  Relationships  . Social Herbalist on phone: Not on file    Gets together: Not on file    Attends religious service: Not on file    Active member of club or organization: Not on file    Attends meetings of clubs or organizations: Not on file    Relationship status: Not on file  . Intimate partner violence    Fear of current or ex partner: Not on file    Emotionally abused: Not on file    Physically abused: Not on file    Forced sexual activity: Not on file  Other Topics Concern  . Not on file  Social History Narrative  . Not on file     Physical Exam: Unable to perform because this was a "telemed visit" due to current Covid-19 pandemic  Assessment and plan: 68 y.o. male with GERD without alarm symptoms  We will call him in a new prescription for proton pump inhibitor omeprazole 40 mg he will take this shoulder for breakfast every morning.  He will also start taking H2 blocker at bedtime every night as that is particularly helpful for overnight acid issues.  He will call to report on his response to these therapies in 3 to 4 weeks.  If he is still very bothered by GERD issues then I would consider doubling his proton pump inhibitor and also potentially reexamining his upper GI tract with endoscopy.   Perhaps his Nissen has failed, perhaps the hiatal hernia has grown?  Please see the "Patient Instructions" section for addition details about the plan.  Owens Loffler, MD [facility] 01/02/2019, 9:55 AM

## 2019-01-23 ENCOUNTER — Encounter: Payer: Self-pay | Admitting: Family Medicine

## 2019-01-23 ENCOUNTER — Other Ambulatory Visit: Payer: Self-pay

## 2019-01-23 ENCOUNTER — Ambulatory Visit (INDEPENDENT_AMBULATORY_CARE_PROVIDER_SITE_OTHER): Payer: Medicare Other | Admitting: Family Medicine

## 2019-01-23 DIAGNOSIS — E785 Hyperlipidemia, unspecified: Secondary | ICD-10-CM | POA: Diagnosis not present

## 2019-01-23 DIAGNOSIS — M5432 Sciatica, left side: Secondary | ICD-10-CM

## 2019-01-23 DIAGNOSIS — M1612 Unilateral primary osteoarthritis, left hip: Secondary | ICD-10-CM | POA: Diagnosis not present

## 2019-01-23 DIAGNOSIS — K219 Gastro-esophageal reflux disease without esophagitis: Secondary | ICD-10-CM

## 2019-01-23 DIAGNOSIS — N138 Other obstructive and reflux uropathy: Secondary | ICD-10-CM

## 2019-01-23 DIAGNOSIS — G20 Parkinson's disease: Secondary | ICD-10-CM

## 2019-01-23 DIAGNOSIS — N401 Enlarged prostate with lower urinary tract symptoms: Secondary | ICD-10-CM

## 2019-01-23 LAB — CBC WITH DIFFERENTIAL/PLATELET
Basophils Absolute: 0.1 10*3/uL (ref 0.0–0.1)
Basophils Relative: 0.9 % (ref 0.0–3.0)
Eosinophils Absolute: 0.1 10*3/uL (ref 0.0–0.7)
Eosinophils Relative: 1.3 % (ref 0.0–5.0)
HCT: 46.9 % (ref 39.0–52.0)
Hemoglobin: 16.5 g/dL (ref 13.0–17.0)
Lymphocytes Relative: 22.7 % (ref 12.0–46.0)
Lymphs Abs: 1.5 10*3/uL (ref 0.7–4.0)
MCHC: 35.1 g/dL (ref 30.0–36.0)
MCV: 91.2 fl (ref 78.0–100.0)
Monocytes Absolute: 0.5 10*3/uL (ref 0.1–1.0)
Monocytes Relative: 8.3 % (ref 3.0–12.0)
Neutro Abs: 4.4 10*3/uL (ref 1.4–7.7)
Neutrophils Relative %: 66.8 % (ref 43.0–77.0)
Platelets: 212 10*3/uL (ref 150.0–400.0)
RBC: 5.14 Mil/uL (ref 4.22–5.81)
RDW: 14.1 % (ref 11.5–15.5)
WBC: 6.5 10*3/uL (ref 4.0–10.5)

## 2019-01-23 LAB — BASIC METABOLIC PANEL
BUN: 15 mg/dL (ref 6–23)
CO2: 30 mEq/L (ref 19–32)
Calcium: 9.5 mg/dL (ref 8.4–10.5)
Chloride: 101 mEq/L (ref 96–112)
Creatinine, Ser: 0.87 mg/dL (ref 0.40–1.50)
GFR: 87.47 mL/min (ref >60.00)
Glucose, Bld: 111 mg/dL — ABNORMAL HIGH (ref 70–99)
Potassium: 3.9 mEq/L (ref 3.5–5.1)
Sodium: 142 mEq/L (ref 135–145)

## 2019-01-23 LAB — LIPID PANEL
Cholesterol: 146 mg/dL (ref 0–200)
HDL: 41.7 mg/dL (ref >39.00)
NonHDL: 104.41
Total CHOL/HDL Ratio: 4
Triglycerides: 253 mg/dL — ABNORMAL HIGH (ref 0.0–149.0)
VLDL: 50.6 mg/dL — ABNORMAL HIGH (ref 0.0–40.0)

## 2019-01-23 LAB — HEPATIC FUNCTION PANEL
ALT: 22 U/L (ref 0–53)
AST: 20 U/L (ref 0–37)
Albumin: 4.5 g/dL (ref 3.5–5.2)
Alkaline Phosphatase: 90 U/L (ref 39–117)
Bilirubin, Direct: 0.4 mg/dL — ABNORMAL HIGH (ref 0.0–0.3)
Total Bilirubin: 3.5 mg/dL — ABNORMAL HIGH (ref 0.2–1.2)
Total Protein: 6.9 g/dL (ref 6.0–8.3)

## 2019-01-23 LAB — LDL CHOLESTEROL, DIRECT: Direct LDL: 75 mg/dL

## 2019-01-23 LAB — TSH: TSH: 2.03 u[IU]/mL (ref 0.35–4.50)

## 2019-01-23 LAB — PSA: PSA: 0.6 ng/mL (ref 0.10–4.00)

## 2019-01-23 MED ORDER — ONDANSETRON HCL 8 MG PO TABS: 8.0000 mg | ORAL_TABLET | Freq: Three times a day (TID) | ORAL | 5 refills | Status: DC | PRN

## 2019-01-23 MED ORDER — FAMOTIDINE 20 MG PO TABS: 20.0000 mg | ORAL_TABLET | Freq: Every day | ORAL | 0 refills | Status: DC

## 2019-01-23 NOTE — Progress Notes (Signed)
Subjective:    Patient ID: [name] [name], male    DOB: Dec 23, 1950, 68 y.o.   MRN: [id number]  HPI Here to follow up on issues. He is doing well in general, though his sciatica has been acting up. He asks to be referred for some PT again. He stays active working in his yard with his wife. His Parkinisons seems to be stable. His GERD is controlled.    Review of Systems  Constitutional: Negative.   HENT: Negative.   Eyes: Negative.   Respiratory: Negative.   Cardiovascular: Negative.   Gastrointestinal: Negative.   Genitourinary: Negative.   Musculoskeletal: Negative.   Skin: Negative.   Neurological: Negative.   Psychiatric/Behavioral: Negative.        Objective:   Physical Exam Constitutional:      General: He is not in acute distress.    Appearance: He is well-developed. He is not diaphoretic.  HENT:     Head: Normocephalic and atraumatic.     Right Ear: External ear normal.     Left Ear: External ear normal.     Nose: Nose normal.     Mouth/Throat:     Pharynx: No oropharyngeal exudate.  Eyes:     General: No scleral icterus.       Right eye: No discharge.        Left eye: No discharge.     Conjunctiva/sclera: Conjunctivae normal.     Pupils: Pupils are equal, round, and reactive to light.  Neck:     Musculoskeletal: Neck supple.     Thyroid: No thyromegaly.     Vascular: No JVD.     Trachea: No tracheal deviation.  Cardiovascular:     Rate and Rhythm: Normal rate and regular rhythm.     Heart sounds: Normal heart sounds. No murmur. No friction rub. No gallop.   Pulmonary:     Effort: Pulmonary effort is normal. No respiratory distress.     Breath sounds: Normal breath sounds. No wheezing or rales.  Chest:     Chest wall: No tenderness.  Abdominal:     General: Bowel sounds are normal. There is no distension.     Palpations: Abdomen is soft. There is no mass.     Tenderness: There is no abdominal tenderness. There is no guarding or rebound.   Genitourinary:    Penis: Normal. No tenderness.      Prostate: Normal.     Rectum: Normal. Guaiac result negative.  Musculoskeletal: Normal range of motion.        General: No tenderness.  Lymphadenopathy:     Cervical: No cervical adenopathy.  Skin:    General: Skin is warm and dry.     Coloration: Skin is not pale.     Findings: No erythema or rash.  Neurological:     Mental Status: He is alert and oriented to person, place, and time.     Cranial Nerves: No cranial nerve deficit.     Motor: No abnormal muscle tone.     Coordination: Coordination normal.     Deep Tendon Reflexes: Reflexes are normal and symmetric. Reflexes normal.  Psychiatric:        Behavior: Behavior normal.        Thought Content: Thought content normal.        Judgment: Judgment normal.           Assessment & Plan:  His GERD is stable. His Parkinisons is stable, he will follow up with Dr. Carles Collet.  Get fasting labs today to check lipids, etc. We will refer him to PT for the sciatica.  [name], MD

## 2019-01-26 NOTE — Progress Notes (Signed)
Virtual Visit via Video Note The purpose of this virtual visit is to provide medical care while limiting exposure to the novel coronavirus.    Consent was obtained for video visit:  Yes.   Answered questions that patient had about telehealth interaction:  Yes.   I discussed the limitations, risks, security and privacy concerns of performing an evaluation and management service by telemedicine. I also discussed with the patient that there may be a patient responsible charge related to this service. The patient expressed understanding and agreed to proceed.  Pt location: Home Physician Location: office Name of referring provider:  [name], MD I connected with [name] [name] at patients initiation/request on 01/28/2019 at  9:45 AM EDT by video enabled telemedicine application and verified that I am speaking with the correct person using two identifiers. Pt MRN:  [id number] Pt DOB:  [birth year] Video Participants:  [name];     History of Present Illness:  Patient seen today in follow-up for Parkinson's disease.  He is on pramipexole ER, 1.5 mg daily.  He has no compulsive behaviors.  No falls. "My balance has been superb."  Tremor increased over the last 2 weeks but still quite good/manageable.   No lightheadedness or near syncope.  No sleep attacks.  He had a total hip arthroplasty done since our last visit and states that he recovered after 2-3 weeks.  His hip pain is resolved but "my sciatica has been horrid."  States that Dr. Sarajane Jews is setting him up to start PT tomorrow.   He is still exercising.   His feet also are causing pain and told by podiatry and ortho that it was due to arthritis in the feet and there was nothing to do.  Medical records are reviewed since our last visit, including notes from his primary care physician as well as from his gastroenterologist.  His reflux is much better.     Current Outpatient Medications on File Prior to Visit  Medication Sig  Dispense Refill  . atorvastatin (LIPITOR) 20 MG tablet Take 1 tablet (20 mg total) by mouth daily. 90 tablet 3  . famotidine (PEPCID) 20 MG tablet Take 1 tablet (20 mg total) by mouth at bedtime. 10 tablet 0  . finasteride (PROSCAR) 5 MG tablet Take 1 tablet (5 mg total) by mouth daily. 90 tablet 3  . hydrochlorothiazide (MICROZIDE) 12.5 MG capsule Take 1 capsule (12.5 mg total) by mouth daily. 90 capsule 3  . omeprazole (PRILOSEC) 40 MG capsule Take 1 capsule (40 mg total) by mouth daily before breakfast. 30 capsule 11  . ondansetron (ZOFRAN) 8 MG tablet Take 1 tablet (8 mg total) by mouth every 8 (eight) hours as needed for nausea or vomiting. 60 tablet 5  . polyethylene glycol (MIRALAX / GLYCOLAX) packet Take 17 g by mouth daily as needed for moderate constipation.     . Pramipexole Dihydrochloride (MIRAPEX ER) 1.5 MG TB24 Take 1 tablet (1.5 mg total) by mouth daily. 10 tablet 0  . tamsulosin (FLOMAX) 0.4 MG CAPS capsule Take 1 capsule (0.4 mg total) by mouth daily. 90 capsule 3  . traMADol (ULTRAM) 50 MG tablet Take 2 tablets (100 mg total) by mouth every 6 (six) hours as needed for moderate pain. 120 tablet 5   No current facility-administered medications on file prior to visit.      Observations/Objective:   Vitals:   01/28/19 0812  Weight: 211 lb (95.7 kg)  Height: 5\' 10"  (1.778 m)  GEN:  The patient appears stated age and is in NAD.  Neurological examination:  Orientation: The patient is alert and oriented x3. Cranial nerves: There is good facial symmetry. There is mild facial hypomimia.  The speech is fluent and clear. Soft palate rises symmetrically and there is no tongue deviation. Hearing is intact to conversational tone. Motor: Strength is at least antigravity x 4.   Shoulder shrug is equal and symmetric.  There is no pronator drift.  Movement examination: Tone: unable Abnormal movements: none Coordination:  There is no decremation with RAM's, with any form of RAMS,  including alternating supination and pronation of the forearm, hand opening and closing, finger taps bilaterally. Gait and Station: The patient has to push off of the chair (due to sciatic pain).  Once up, he walks well in his home.      Chemistry      Component Value Date/Time   NA 142 01/23/2019 1118   K 3.9 01/23/2019 1118   CL 101 01/23/2019 1118   CO2 30 01/23/2019 1118   BUN 15 01/23/2019 1118   CREATININE 0.87 01/23/2019 1118      Component Value Date/Time   CALCIUM 9.5 01/23/2019 1118   ALKPHOS 90 01/23/2019 1118   AST 20 01/23/2019 1118   ALT 22 01/23/2019 1118   BILITOT 3.5 (H) 01/23/2019 1118     Lab Results  Component Value Date   WBC 6.5 01/23/2019   HGB 16.5 01/23/2019   HCT 46.9 01/23/2019   MCV 91.2 01/23/2019   PLT 212.0 01/23/2019   Lab Results  Component Value Date   CHOL 146 01/23/2019   HDL 41.70 01/23/2019   LDLCALC 61 08/23/2016   LDLDIRECT 75.0 01/23/2019   TRIG 253.0 (H) 01/23/2019   CHOLHDL 4 01/23/2019    Assessment and Plan:   1. Parkinsons disease             -continue pramipexole ER, 1.5 mg daily.  Risks, benefits, side effects and alternative therapies were discussed.  The opportunity to ask questions was given and they were answered to the best of my ability.  The patient expressed understanding and willingness to follow the outlined treatment protocols.             -Asked me multiple questions about research, cures, etc.  Answered these to the best of my ability.  Currently, a cure is quite a ways off, in my opinion.  -Continue exercise as tolerated.  -Safety discussed. 2.  L hip pain             -s/p THA on 05/27/19  Follow Up Instructions:  5-6 months  -I discussed the assessment and treatment plan with the patient. The patient was provided an opportunity to ask questions and all were answered. The patient agreed with the plan and demonstrated an understanding of the instructions.   The patient was advised to call back or seek an  in-person evaluation if the symptoms worsen or if the condition fails to improve as anticipated.    Total Time spent in visit with the patient was:  25 min, of which more than 50% of the time was spent in counseling and/or coordinating care on safety.   Pt understands and agrees with the plan of care outlined.     [name] Bogus, DO

## 2019-01-28 ENCOUNTER — Telehealth: Payer: Self-pay | Admitting: Gastroenterology

## 2019-01-28 ENCOUNTER — Other Ambulatory Visit: Payer: Self-pay

## 2019-01-28 ENCOUNTER — Encounter: Payer: Self-pay | Admitting: Neurology

## 2019-01-28 ENCOUNTER — Telehealth (INDEPENDENT_AMBULATORY_CARE_PROVIDER_SITE_OTHER): Payer: Medicare Other | Admitting: Neurology

## 2019-01-28 VITALS — Ht 70.0 in | Wt 211.0 lb

## 2019-01-28 DIAGNOSIS — G20 Parkinson's disease: Secondary | ICD-10-CM

## 2019-01-28 MED ORDER — PRAMIPEXOLE DIHYDROCHLORIDE ER 1.5 MG PO TB24: 1.5000 mg | ORAL_TABLET | Freq: Every day | ORAL | 1 refills | Status: DC

## 2019-01-28 NOTE — Telephone Encounter (Signed)
FYI Dr Jacobs  

## 2019-01-29 DIAGNOSIS — M545 Low back pain: Secondary | ICD-10-CM | POA: Diagnosis not present

## 2019-01-29 DIAGNOSIS — I10 Essential (primary) hypertension: Secondary | ICD-10-CM | POA: Diagnosis not present

## 2019-02-02 DIAGNOSIS — M545 Low back pain: Secondary | ICD-10-CM | POA: Diagnosis not present

## 2019-02-02 DIAGNOSIS — I10 Essential (primary) hypertension: Secondary | ICD-10-CM | POA: Diagnosis not present

## 2019-02-06 DIAGNOSIS — M545 Low back pain: Secondary | ICD-10-CM | POA: Diagnosis not present

## 2019-02-09 DIAGNOSIS — M545 Low back pain: Secondary | ICD-10-CM | POA: Diagnosis not present

## 2019-02-12 DIAGNOSIS — M545 Low back pain: Secondary | ICD-10-CM | POA: Diagnosis not present

## 2019-02-16 DIAGNOSIS — M545 Low back pain: Secondary | ICD-10-CM | POA: Diagnosis not present

## 2019-02-19 DIAGNOSIS — M545 Low back pain: Secondary | ICD-10-CM | POA: Diagnosis not present

## 2019-02-20 ENCOUNTER — Telehealth: Payer: Self-pay | Admitting: Family Medicine

## 2019-02-20 DIAGNOSIS — M25562 Pain in left knee: Secondary | ICD-10-CM

## 2019-02-20 DIAGNOSIS — G8929 Other chronic pain: Secondary | ICD-10-CM

## 2019-02-20 NOTE — Telephone Encounter (Signed)
Copied from [place] [id number]. Topic: Referral - Request for Referral >> Feb 20, 2019 12:26 PM Sheran Luz wrote: Has patient seen PCP for this complaint? Yes Referral for which specialty: Ortho Preferred provider/office: none specified  Reason for referral: knee pain

## 2019-02-20 NOTE — Telephone Encounter (Signed)
Ok for referral?

## 2019-02-20 NOTE — Telephone Encounter (Signed)
The referral was done  

## 2019-02-23 NOTE — Telephone Encounter (Signed)
LM with patient's wife. Nothing further needed.

## 2019-02-24 DIAGNOSIS — M25561 Pain in right knee: Secondary | ICD-10-CM | POA: Diagnosis not present

## 2019-03-04 ENCOUNTER — Telehealth: Payer: Self-pay | Admitting: Neurology

## 2019-03-04 NOTE — Telephone Encounter (Signed)
Called spoke with patient he wanted to update provider that he is going in next Wednesday on 03/11/19 for knee surgery [facility]

## 2019-03-04 NOTE — Telephone Encounter (Signed)
Patient left VM about Surgery- Chipped bone in his knee. He didn't know if they would pass on the info to yall. So just FYI. He is having surgery. Thanks!

## 2019-03-04 NOTE — Telephone Encounter (Signed)
Thank you!  I believe we discussed it on telemed visit but he should take his PD meds as normal.  Avoid phenergan if needed for nausea (zofran is preferred).  Hopefully they can do it under epidural.  Its the preferred method in Parkinsons patients, if possible.  Good luck!

## 2019-03-04 NOTE — Telephone Encounter (Signed)
Called spoke with patient he is aware

## 2019-03-11 DIAGNOSIS — X58XXXA Exposure to other specified factors, initial encounter: Secondary | ICD-10-CM | POA: Diagnosis not present

## 2019-03-11 DIAGNOSIS — Y999 Unspecified external cause status: Secondary | ICD-10-CM | POA: Diagnosis not present

## 2019-03-11 DIAGNOSIS — S83231A Complex tear of medial meniscus, current injury, right knee, initial encounter: Secondary | ICD-10-CM | POA: Diagnosis not present

## 2019-03-11 DIAGNOSIS — M659 Synovitis and tenosynovitis, unspecified: Secondary | ICD-10-CM | POA: Diagnosis not present

## 2019-03-11 DIAGNOSIS — M6751 Plica syndrome, right knee: Secondary | ICD-10-CM | POA: Diagnosis not present

## 2019-03-11 DIAGNOSIS — M2341 Loose body in knee, right knee: Secondary | ICD-10-CM | POA: Diagnosis not present

## 2019-03-16 DIAGNOSIS — M25561 Pain in right knee: Secondary | ICD-10-CM | POA: Diagnosis not present

## 2019-03-19 DIAGNOSIS — M545 Low back pain: Secondary | ICD-10-CM | POA: Diagnosis not present

## 2019-03-24 DIAGNOSIS — M545 Low back pain: Secondary | ICD-10-CM | POA: Diagnosis not present

## 2019-03-26 DIAGNOSIS — M545 Low back pain: Secondary | ICD-10-CM | POA: Diagnosis not present

## 2019-03-31 DIAGNOSIS — M67442 Ganglion, left hand: Secondary | ICD-10-CM | POA: Diagnosis not present

## 2019-03-31 DIAGNOSIS — M7989 Other specified soft tissue disorders: Secondary | ICD-10-CM | POA: Diagnosis not present

## 2019-04-02 DIAGNOSIS — M25561 Pain in right knee: Secondary | ICD-10-CM | POA: Diagnosis not present

## 2019-04-20 DIAGNOSIS — Z9889 Other specified postprocedural states: Secondary | ICD-10-CM | POA: Diagnosis not present

## 2019-04-20 DIAGNOSIS — M25561 Pain in right knee: Secondary | ICD-10-CM | POA: Diagnosis not present

## 2019-04-23 ENCOUNTER — Ambulatory Visit: Payer: Medicare Other | Admitting: Orthopaedic Surgery

## 2019-04-24 DIAGNOSIS — H2513 Age-related nuclear cataract, bilateral: Secondary | ICD-10-CM | POA: Diagnosis not present

## 2019-05-05 ENCOUNTER — Telehealth: Payer: Self-pay | Admitting: Neurology

## 2019-05-05 NOTE — Telephone Encounter (Signed)
Probably will need to come from PCP as they will need records pertaining to his knee.  I usually recommend Dr. Theda Sers or Dr. Maureen Ralphs as they do lots of PD patients and often under epidural.  [facility]

## 2019-05-05 NOTE — Telephone Encounter (Signed)
Patient called requesting a referral for knee surgery for his right knee.

## 2019-05-06 NOTE — Telephone Encounter (Signed)
Pt notified to contact his pcp for surgery for referral

## 2019-05-07 DIAGNOSIS — X58XXXA Exposure to other specified factors, initial encounter: Secondary | ICD-10-CM | POA: Diagnosis not present

## 2019-05-07 DIAGNOSIS — M1711 Unilateral primary osteoarthritis, right knee: Secondary | ICD-10-CM | POA: Diagnosis not present

## 2019-05-07 DIAGNOSIS — Z9889 Other specified postprocedural states: Secondary | ICD-10-CM | POA: Diagnosis not present

## 2019-05-07 DIAGNOSIS — S83241A Other tear of medial meniscus, current injury, right knee, initial encounter: Secondary | ICD-10-CM | POA: Diagnosis not present

## 2019-05-07 DIAGNOSIS — M659 Synovitis and tenosynovitis, unspecified: Secondary | ICD-10-CM | POA: Diagnosis not present

## 2019-05-07 DIAGNOSIS — S83281A Other tear of lateral meniscus, current injury, right knee, initial encounter: Secondary | ICD-10-CM | POA: Diagnosis not present

## 2019-05-07 DIAGNOSIS — M7121 Synovial cyst of popliteal space [Baker], right knee: Secondary | ICD-10-CM | POA: Diagnosis not present

## 2019-05-07 DIAGNOSIS — M25561 Pain in right knee: Secondary | ICD-10-CM | POA: Diagnosis not present

## 2019-05-08 ENCOUNTER — Other Ambulatory Visit: Payer: Self-pay

## 2019-05-08 DIAGNOSIS — M67442 Ganglion, left hand: Secondary | ICD-10-CM | POA: Diagnosis not present

## 2019-05-13 ENCOUNTER — Telehealth: Payer: Self-pay | Admitting: Neurology

## 2019-05-13 MED ORDER — PRAMIPEXOLE DIHYDROCHLORIDE ER 1.5 MG PO TB24: 1.5000 mg | ORAL_TABLET | Freq: Every day | ORAL | 1 refills | Status: DC

## 2019-05-13 NOTE — Telephone Encounter (Signed)
Spoke to wife and she is aware Dr[facility] Carles Collet [facility]

## 2019-05-13 NOTE — Telephone Encounter (Signed)
[facility] get [facility]ill on the Mirapex ER but they told her to call our office for the refill request   [facility] number is [phone]   Last seen Tat on 01-28-19

## 2019-05-13 NOTE — Telephone Encounter (Signed)
Let pt know that I [facility]

## 2019-05-13 NOTE — Telephone Encounter (Signed)
Dr. Carles Collet - patient's next appt with you is 07/20/18. Would you like the script for 90 pills and no refills which should get him through next appt?

## 2019-05-14 ENCOUNTER — Telehealth: Payer: Self-pay | Admitting: Family Medicine

## 2019-05-14 NOTE — Telephone Encounter (Signed)
PPW faxed  

## 2019-05-14 NOTE — Telephone Encounter (Signed)
Patient notified of update  and verbalized understanding. 

## 2019-05-14 NOTE — Telephone Encounter (Signed)
Pt's spouse is calling in to follow up on clearance form that was faxed over to PCP about 3 days ago. (Pre-op Risk Assessment) the surgeon is wanting to complete a total knee replacement but is unable to complete surgery until form is completed   Please assist pt / spouse further.

## 2019-05-15 ENCOUNTER — Encounter: Payer: Self-pay | Admitting: Family Medicine

## 2019-05-15 NOTE — Telephone Encounter (Signed)
Pts wife called to check on this fax. Pts wife is requesting to have it sent in today and to receive a call back once it is done. Please advise.

## 2019-05-15 NOTE — Telephone Encounter (Signed)
Pt states the letter also requested most recent labs, EKG and OV notes.  Please fax to surgeon's office.

## 2019-05-15 NOTE — Telephone Encounter (Signed)
Called pt spouse back no answer no vm. I already informed the pt.

## 2019-05-21 ENCOUNTER — Telehealth: Payer: Self-pay | Admitting: *Deleted

## 2019-05-21 ENCOUNTER — Other Ambulatory Visit: Payer: Self-pay | Admitting: Orthopedic Surgery

## 2019-05-21 DIAGNOSIS — M1711 Unilateral primary osteoarthritis, right knee: Secondary | ICD-10-CM | POA: Diagnosis not present

## 2019-05-21 NOTE — Telephone Encounter (Signed)
Copied from [place] [id number]. Topic: General - Other >> May 20, 2019  7:59 AM Carolyn Stare wrote: Pt wife call to say Dr Ronnie Derby office is waiting on pt last labs and EKG and she is asking if they were sent

## 2019-05-22 NOTE — Telephone Encounter (Signed)
FYI  This has been taking care of. Called Dr.Lucy's office to verify that PPW was already faxed and they confirmed nothing was needed. I informed the pt spouse of the update and spouse verbalized understanding. Spouse stated that the PPW was prob. Not needed anymore bc she is switching from Dr.Lucey's office bc " they are not on the same page [place] me" I advised that it may slow the process down for pt to received immediate help esp if he is in pain. Spouse verbalized and stated that she is still switching pt and will call us if PPW is needed.

## 2019-05-26 NOTE — Patient Instructions (Addendum)
DUE TO COVID-19 ONLY ONE VISITOR IS ALLOWED TO COME WITH YOU AND STAY IN THE WAITING ROOM ONLY DURING PRE OP AND PROCEDURE DAY OF SURGERY. THE 1 VISITOR MAY VISIT WITH YOU AFTER SURGERY IN YOUR PRIVATE ROOM DURING VISITING HOURS ONLY!  YOU NEED TO HAVE A COVID 19 TEST ON_Thursday 11/12/2020______ @_10 :40am______, THIS TEST MUST BE DONE BEFORE SURGERY, COME  [place] , [id number].  ([place]) ONCE YOUR COVID TEST IS COMPLETED, PLEASE BEGIN THE QUARANTINE INSTRUCTIONS AS OUTLINED IN YOUR HANDOUT.                Yandriel Behm Kubota    Your procedure is scheduled on: 06/01/2019   Report to [facility] Main  Entrance    Report to admitting at 12:45pm     Call this number if you have problems the morning of surgery [phone]     Remember: NO SOLID FOOD AFTER MIDNIGHT THE NIGHT PRIOR TO SURGERY. NOTHING BY MOUTH EXCEPT CLEAR LIQUIDS UNTIL 12:15 PM. PLEASE FINISH ENSURE DRINK PER SURGEON ORDER  WHICH NEEDS TO BE COMPLETED AT 12:15 PM.   CLEAR LIQUID DIET   Foods Allowed                                                                     Foods Excluded  Coffee and tea, regular and decaf                             liquids that you cannot  Plain Jell-O any favor except red or purple             see through such as: Fruit ices (not with fruit pulp)                                     milk, soups, orange juice  Iced Popsicles                                    All solid food Carbonated beverages, regular and diet                                    Cranberry, grape and apple juices Sports drinks like Gatorade Lightly seasoned clear broth or consume(fat free) Sugar, honey syrup  Sample Menu Breakfast                                Lunch                                     Supper Cranberry juice                    Beef broth  Chicken broth Jell-O                                     Grape juice                            Apple juice Coffee or tea                        Jell-O                                      Popsicle                                                Coffee or tea                        Coffee or tea  _____________________________________________________________________      Take these medicines the morning of surgery with A SIP OF WATER: Omeprazole (Prilosec)  BRUSH YOUR TEETH MORNING OF SURGERY AND RINSE YOUR MOUTH OUT, NO CHEWING GUM CANDY OR MINTS.                                You may not have any metal on your body including hair pins and              piercings    Do not wear jewelry, lotions, powders or cologne, deodorant              Men may shave face and neck.   Do not bring valuables to the hospital. [place].  Contacts, dentures or bridgework may not be worn into surgery.  Leave suitcase in the car. After surgery it may be brought to your room.                  Please read over the following fact sheets you were given: _____________________________________________________________________             [facility] - Preparing for Surgery Before surgery, you can play an important role.  Because skin is not sterile, your skin needs to be as free of germs as possible.  You can reduce the number of germs on your skin by washing with CHG (chlorahexidine gluconate) soap before surgery.  CHG is an antiseptic cleaner which kills germs and bonds with the skin to continue killing germs even after washing. Please DO NOT use if you have an allergy to CHG or antibacterial soaps.  If your skin becomes reddened/irritated stop using the CHG and inform your nurse when you arrive at Short Stay. Do not shave (including legs and underarms) for at least 48 hours prior to the first CHG shower.  You may shave your face/neck. Please follow these instructions carefully:  1.  Shower with CHG Soap the night before surgery and the  morning of  Surgery.  2.  If you choose to wash your hair, wash your  hair first as usual with your  normal  shampoo.  3.  After you shampoo, rinse your hair and body thoroughly to remove the  shampoo.                            4.  Use CHG as you would any other liquid soap.  You can apply chg directly  to the skin and wash                       Gently with a scrungie or clean washcloth.  5.  Apply the CHG Soap to your body ONLY FROM THE NECK DOWN.   Do not use on face/ open                           Wound or open sores. Avoid contact with eyes, ears mouth and genitals (private parts).                       Wash face,  Genitals (private parts) with your normal soap.             6.  Wash thoroughly, paying special attention to the area where your surgery  will be performed.  7.  Thoroughly rinse your body with warm water from the neck down.  8.  DO NOT shower/wash with your normal soap after using and rinsing off  the CHG Soap.                9.  Pat yourself dry with a clean towel.            10.  Wear clean pajamas.            11.  Place clean sheets on your bed the night of your first shower and do not  sleep with pets. Day of Surgery : Do not apply any lotions/deodorants the morning of surgery.  Please wear clean clothes to the hospital/surgery center.  FAILURE TO FOLLOW THESE INSTRUCTIONS MAY RESULT IN THE CANCELLATION OF YOUR SURGERY PATIENT SIGNATURE_________________________________  NURSE SIGNATURE__________________________________  ________________________________________________________________________   Adam Phenix  An incentive spirometer is a tool that can help keep your lungs clear and active. This tool measures how well you are filling your lungs with each breath. Taking long deep breaths may help reverse or decrease the chance of developing breathing (pulmonary) problems (especially infection) following:  A long period of time when you are unable to move or be active. BEFORE  THE PROCEDURE   If the spirometer includes an indicator to show your best effort, your nurse or respiratory therapist will set it to a desired goal.  If possible, sit up straight or lean slightly forward. Try not to slouch.  Hold the incentive spirometer in an upright position. INSTRUCTIONS FOR USE  1. Sit on the edge of your bed if possible, or sit up as far as you can in bed or on a chair. 2. Hold the incentive spirometer in an upright position. 3. Breathe out normally. 4. Place the mouthpiece in your mouth and seal your lips tightly around it. 5. Breathe in slowly and as deeply as possible, raising the piston or the ball toward the top of the column. 6. Hold your breath for 3-5 seconds or for as long as possible. Allow the piston or ball to fall to the bottom of the column. 7.  Remove the mouthpiece from your mouth and breathe out normally. 8. Rest for a few seconds and repeat Steps 1 through 7 at least 10 times every 1-2 hours when you are awake. Take your time and take a few normal breaths between deep breaths. 9. The spirometer may include an indicator to show your best effort. Use the indicator as a goal to work toward during each repetition. 10. After each set of 10 deep breaths, practice coughing to be sure your lungs are clear. If you have an incision (the cut made at the time of surgery), support your incision when coughing by placing a pillow or rolled up towels firmly against it. Once you are able to get out of bed, walk around indoors and cough well. You may stop using the incentive spirometer when instructed by your caregiver.  RISKS AND COMPLICATIONS  Take your time so you do not get dizzy or light-headed.  If you are in pain, you may need to take or ask for pain medication before doing incentive spirometry. It is harder to take a deep breath if you are having pain. AFTER USE  Rest and breathe slowly and easily.  It can be helpful to keep track of a log of your progress.  Your caregiver can provide you with a simple table to help with this. If you are using the spirometer at home, follow these instructions: Rachel IF:   You are having difficultly using the spirometer.  You have trouble using the spirometer as often as instructed.  Your pain medication is not giving enough relief while using the spirometer.  You develop fever of 100.5 F (38.1 C) or higher. SEEK IMMEDIATE MEDICAL CARE IF:   You cough up bloody sputum that had not been present before.  You develop fever of 102 F (38.9 C) or greater.  You develop worsening pain at or near the incision site. MAKE SURE YOU:   Understand these instructions.  Will watch your condition.  Will get help right away if you are not doing well or get worse. Document Released: 11/12/2006 Document Revised: 09/24/2011 Document Reviewed: 01/13/2007 [facility] Patient Information 2014 [place], [place].   ________________________________________________________________________

## 2019-05-27 NOTE — Progress Notes (Signed)
PCP - [name]  Cardiologist -   Chest x-ray -  EKG -  Stress Test -  ECHO -  Cardiac Cath -   Sleep Study -  CPAP -   Fasting Blood Sugar -  Checks Blood Sugar _____ times a day  Blood Thinner Instructions: Aspirin Instructions: Last Dose:  Anesthesia review:   Patient denies shortness of breath, fever, cough and chest pain at PAT appointment   Patient verbalized understanding of instructions that were given to them at the PAT appointment. Patient was also instructed that they will need to review over the PAT instructions again at home before surgery.

## 2019-05-28 ENCOUNTER — Encounter (HOSPITAL_COMMUNITY)
Admission: RE | Admit: 2019-05-28 | Discharge: 2019-05-28 | Disposition: A | Discharge status: Home / Self Care | Payer: Medicare Other | Source: Ambulatory Visit | Attending: Orthopedic Surgery | Admitting: Orthopedic Surgery

## 2019-05-28 ENCOUNTER — Other Ambulatory Visit: Payer: Self-pay

## 2019-05-28 ENCOUNTER — Other Ambulatory Visit (HOSPITAL_COMMUNITY)
Admission: RE | Admit: 2019-05-28 | Discharge: 2019-05-28 | Disposition: A | Discharge status: Home / Self Care | Payer: Medicare Other | Source: Ambulatory Visit | Attending: Orthopedic Surgery | Admitting: Orthopedic Surgery

## 2019-05-28 ENCOUNTER — Encounter (HOSPITAL_COMMUNITY): Payer: Self-pay

## 2019-05-28 ENCOUNTER — Ambulatory Visit (HOSPITAL_COMMUNITY)
Admission: RE | Admit: 2019-05-28 | Discharge: 2019-05-28 | Disposition: A | Discharge status: Home / Self Care | Payer: Medicare Other | Source: Ambulatory Visit | Attending: Orthopedic Surgery | Admitting: Orthopedic Surgery

## 2019-05-28 DIAGNOSIS — Z20828 Contact with and (suspected) exposure to other viral communicable diseases: Secondary | ICD-10-CM | POA: Insufficient documentation

## 2019-05-28 DIAGNOSIS — Z880 Allergy status to penicillin: Secondary | ICD-10-CM | POA: Insufficient documentation

## 2019-05-28 DIAGNOSIS — R918 Other nonspecific abnormal finding of lung field: Secondary | ICD-10-CM | POA: Insufficient documentation

## 2019-05-28 DIAGNOSIS — Z96642 Presence of left artificial hip joint: Secondary | ICD-10-CM | POA: Insufficient documentation

## 2019-05-28 DIAGNOSIS — Z87442 Personal history of urinary calculi: Secondary | ICD-10-CM | POA: Diagnosis not present

## 2019-05-28 DIAGNOSIS — E785 Hyperlipidemia, unspecified: Secondary | ICD-10-CM | POA: Insufficient documentation

## 2019-05-28 DIAGNOSIS — J9811 Atelectasis: Secondary | ICD-10-CM | POA: Diagnosis not present

## 2019-05-28 DIAGNOSIS — G20 Parkinson's disease: Secondary | ICD-10-CM | POA: Insufficient documentation

## 2019-05-28 DIAGNOSIS — Z79899 Other long term (current) drug therapy: Secondary | ICD-10-CM | POA: Insufficient documentation

## 2019-05-28 DIAGNOSIS — Z01818 Encounter for other preprocedural examination: Secondary | ICD-10-CM | POA: Diagnosis not present

## 2019-05-28 DIAGNOSIS — M1711 Unilateral primary osteoarthritis, right knee: Secondary | ICD-10-CM | POA: Insufficient documentation

## 2019-05-28 DIAGNOSIS — Z96611 Presence of right artificial shoulder joint: Secondary | ICD-10-CM | POA: Diagnosis not present

## 2019-05-28 HISTORY — DX: Nausea with vomiting, unspecified: R11.2

## 2019-05-28 HISTORY — DX: Other specified postprocedural states: Z98.890

## 2019-05-28 LAB — BASIC METABOLIC PANEL
Anion gap: 8 (ref 5–15)
BUN: 14 mg/dL (ref 8–23)
CO2: 28 mmol/L (ref 22–32)
Calcium: 9.3 mg/dL (ref 8.9–10.3)
Chloride: 102 mmol/L (ref 98–111)
Creatinine, Ser: 0.74 mg/dL (ref 0.61–1.24)
GFR calc Af Amer: >60 mL/min (ref >60)
GFR calc non Af Amer: >60 mL/min (ref >60)
Glucose, Bld: 127 mg/dL — ABNORMAL HIGH (ref 70–99)
Potassium: 3.9 mmol/L (ref 3.5–5.1)
Sodium: 138 mmol/L (ref 135–145)

## 2019-05-28 LAB — URINALYSIS, ROUTINE W REFLEX MICROSCOPIC
Bilirubin Urine: NEGATIVE
Glucose, UA: NEGATIVE mg/dL
Hgb urine dipstick: NEGATIVE
Ketones, ur: NEGATIVE mg/dL
Leukocytes,Ua: NEGATIVE
Nitrite: NEGATIVE
Protein, ur: NEGATIVE mg/dL
Specific Gravity, Urine: 1.018 (ref 1.005–1.030)
pH: 5 (ref 5.0–8.0)

## 2019-05-28 LAB — ABO/RH: ABO/RH(D): O POS

## 2019-05-28 LAB — PROTIME-INR
INR: 1 (ref 0.8–1.2)
Prothrombin Time: 13 seconds (ref 11.4–15.2)

## 2019-05-28 LAB — SURGICAL PCR SCREEN
MRSA, PCR: NEGATIVE
Staphylococcus aureus: NEGATIVE

## 2019-05-28 LAB — CBC WITH DIFFERENTIAL/PLATELET
Abs Immature Granulocytes: 0.03 10*3/uL (ref 0.00–0.07)
Basophils Absolute: 0 10*3/uL (ref 0.0–0.1)
Basophils Relative: 1 %
Eosinophils Absolute: 0.1 10*3/uL (ref 0.0–0.5)
Eosinophils Relative: 1 %
HCT: 44.4 % (ref 39.0–52.0)
Hemoglobin: 15.5 g/dL (ref 13.0–17.0)
Immature Granulocytes: 1 %
Lymphocytes Relative: 16 %
Lymphs Abs: 0.9 10*3/uL (ref 0.7–4.0)
MCH: 31.3 pg (ref 26.0–34.0)
MCHC: 34.9 g/dL (ref 30.0–36.0)
MCV: 89.7 fL (ref 80.0–100.0)
Monocytes Absolute: 0.5 10*3/uL (ref 0.1–1.0)
Monocytes Relative: 8 %
Neutro Abs: 4.4 10*3/uL (ref 1.7–7.7)
Neutrophils Relative %: 73 %
Platelets: 180 10*3/uL (ref 150–400)
RBC: 4.95 MIL/uL (ref 4.22–5.81)
RDW: 13.1 % (ref 11.5–15.5)
WBC: 5.9 10*3/uL (ref 4.0–10.5)
nRBC: 0 % (ref 0.0–0.2)

## 2019-05-28 LAB — APTT: aPTT: 30 seconds (ref 24–36)

## 2019-05-28 NOTE — Progress Notes (Signed)
05-28-19 CXR results routed to Dr. Mayer Camel for review.

## 2019-05-28 NOTE — H&P (Signed)
TOTAL KNEE ADMISSION H&P  Patient is being admitted for right total knee arthroplasty.  Subjective:  Chief Complaint:right knee pain.  HPI: [name] [name], 68 y.o. male, has a history of pain and functional disability in the right knee due to arthritis and has failed non-surgical conservative treatments for greater than 12 weeks to includeNSAID's and/or analgesics, corticosteriod injections, flexibility and strengthening excercises, supervised PT with diminished ADL's post treatment, weight reduction as appropriate and activity modification.  Onset of symptoms was gradual, starting several years ago with gradually worsening course since that time. The patient noted prior procedures on the knee to include  arthroscopy on the right knee(s).  Patient currently rates pain in the right knee(s) at 10 out of 10 with activity. Patient has night pain, worsening of pain with activity and weight bearing, pain that interferes with activities of daily living, pain with passive range of motion, crepitus and joint swelling.  Patient has evidence of subchondral sclerosis, joint subluxation and joint space narrowing by imaging studies.   There is no active infection.  Patient Active Problem List   Diagnosis Date Noted  . Osteoarthritis 10/20/2018  . Primary osteoarthritis of left hip 05/26/2018  . Umbilical hernia 0000000  . Sciatica of left side 09/02/2017  . Paralysis agitans ([place]) 09/21/2013  . BARRETTS ESOPHAGUS 12/13/2009  . DIVERTICULOSIS, COLON 09/01/2009  . VOCAL CORD DISORDER 08/12/2009  . ALLERGIC RHINITIS 03/02/2009  . INSOMNIA, PERSISTENT 02/03/2007  . BPH with urinary obstruction 02/03/2007  . Dyslipidemia 01/28/2007  . GERD 01/28/2007   Past Medical History:  Diagnosis Date  . Arthritis   . Barrett's esophagus    sees Dr. Delfin Edis   . BPH with urinary obstruction   . Complication of anesthesia    Urinary retention. "fights it"- is what he was told. 1990's - vomitted and  aspirated in surgery  . Diverticulosis   . Esophageal dysmotility   . GERD (gastroesophageal reflux disease)    not since [facility]  . History of hiatal hernia   . History of kidney stones    15 in 2015- last one was in Oct 2015- takes HCTZ to help prevent  . Hyperlipidemia   . Insomnia   . Lyme disease 2013  . Parkinson's disease [facility])    sees Dr. Wells Guiles Tat   . [facility] spotted fever) 2012    Past Surgical History:  Procedure Laterality Date  . CHOLECYSTECTOMY    . COLONOSCOPY  06-24-13   per Dr. Olevia Perches, no polyps, diveticulosis only, repeat in 10 yrs   . ESOPHAGOGASTRODUODENOSCOPY  06-24-13   per Dr. Olevia Perches, no signs of Barretts, repeat in 5 yrs   . HERNIA REPAIR Right    right inguinal  . LASIK     bilateral  . lipomas removed     x10/ on arms and legs  . NISSEN FUNDOPLICATION  0000000   laparoscopic per Dr. Johnathan Hausen  . ORCHIOPEXY     right side at age 34   . TONSILLECTOMY AND ADENOIDECTOMY    . TOTAL HIP ARTHROPLASTY Left 05/26/2018   Procedure: TOTAL HIP ARTHROPLASTY ANTERIOR APPROACH;  Surgeon: Dorna Leitz, MD;  Location: [facility];  Service: Orthopedics;  Laterality: Left;  . TOTAL SHOULDER ARTHROPLASTY Right 06/23/2015   Procedure: RIGHT TOTAL SHOULDER ARTHROPLASTY;  Surgeon: Justice Britain, MD;  Location: [facility];  Service: Orthopedics;  Laterality: Right;    No current facility-administered medications for this encounter.    Current Outpatient Medications  Medication Sig Dispense  Refill Last Dose  . atorvastatin (LIPITOR) 20 MG tablet Take 1 tablet (20 mg total) by mouth daily. (Patient taking differently: Take 20 mg by mouth at bedtime. ) 90 tablet 3   . famotidine (PEPCID) 10 MG tablet Take 10 mg by mouth at bedtime as needed for heartburn or indigestion.     . finasteride (PROSCAR) 5 MG tablet Take 1 tablet (5 mg total) by mouth daily. (Patient taking differently: Take 5 mg by mouth at bedtime. ) 90 tablet 3   . hydrochlorothiazide  (MICROZIDE) 12.5 MG capsule Take 1 capsule (12.5 mg total) by mouth daily. (Patient taking differently: Take 12.5 mg by mouth at bedtime. ) 90 capsule 3   . ibuprofen (ADVIL) 200 MG tablet Take 200 mg by mouth daily as needed for headache or moderate pain.     Marland Kitchen omeprazole (PRILOSEC) 40 MG capsule Take 1 capsule (40 mg total) by mouth daily before breakfast. 30 capsule 11   . ondansetron (ZOFRAN) 8 MG tablet Take 1 tablet (8 mg total) by mouth every 8 (eight) hours as needed for nausea or vomiting. 60 tablet 5   . polyethylene glycol (MIRALAX / GLYCOLAX) packet Take 8.5 g by mouth at bedtime.      . Pramipexole Dihydrochloride (MIRAPEX ER) 1.5 MG TB24 Take 1 tablet (1.5 mg total) by mouth daily. (Patient taking differently: Take 1.5 mg by mouth at bedtime. ) 90 tablet 1   . tamsulosin (FLOMAX) 0.4 MG CAPS capsule Take 1 capsule (0.4 mg total) by mouth daily. (Patient taking differently: Take 0.4 mg by mouth at bedtime. ) 90 capsule 3   . traMADol (ULTRAM) 50 MG tablet Take 2 tablets (100 mg total) by mouth every 6 (six) hours as needed for moderate pain. (Patient taking differently: Take 100 mg by mouth at bedtime as needed for moderate pain. ) 120 tablet 5   . famotidine (PEPCID) 20 MG tablet Take 1 tablet (20 mg total) by mouth at bedtime. (Patient not taking: Reported on 05/21/2019) 10 tablet 0 Not Taking at Unknown time   Allergies  Allergen Reactions  . Penicillins Rash and Other (See Comments)    Did it involve swelling of the face/tongue/throat, SOB, or low BP? No Did it involve sudden or severe rash/hives, skin peeling, or any reaction on the inside of your mouth or nose? No Did you need to seek medical attention at a hospital or doctor's office? No When did it last happen?childhood allergy If all above answers are "NO", may proceed with cephalosporin use.      Social History   Tobacco Use  . Smoking status: Never Smoker  . Smokeless tobacco: Never Used  Substance Use Topics   . Alcohol use: No    Alcohol/week: 0.0 standard drinks    Family History  Problem Relation Age of Onset  . Arthritis Mother   . Cancer Mother   . Esophageal cancer Father   . Lung cancer Maternal Grandfather   . Esophageal cancer Paternal Grandmother   . Lung cancer Paternal Grandfather   . Colon cancer Sister        questionable  . Hypertension Brother        over [employer]  . Healthy Daughter   . Esophageal cancer Sister   . Healthy Daughter   . Esophageal cancer Paternal Aunt        x 2  . Esophageal cancer Cousin   . Rectal cancer Neg Hx   . Stomach cancer Neg Hx  Review of Systems  Constitutional: Negative.   HENT: Negative.   Eyes: Negative.   Respiratory: Negative.   Cardiovascular: Negative.   Gastrointestinal: Negative.   Genitourinary:       Stones  Musculoskeletal: Positive for joint pain.  Skin: Negative.   Neurological: Negative.   Endo/Heme/Allergies: Negative.   Psychiatric/Behavioral: Negative.     Objective:  Physical Exam  Constitutional: He is oriented to person, place, and time. He appears well-developed and well-nourished.  HENT:  Head: Normocephalic and atraumatic.  Eyes: Pupils are equal, round, and reactive to light.  Neck: Normal range of motion. Neck supple.  Cardiovascular: Intact distal pulses.  Respiratory: Effort normal.  Musculoskeletal:        General: Tenderness present.     Comments: Patient has obvious varus deformity to the right knee.  Range of motion is from 8-120 collateral ligaments are stable tender along the medial joint line.    Neurological: He is alert and oriented to person, place, and time.  Skin: Skin is warm and dry.  Psychiatric: He has a normal mood and affect. His behavior is normal. Judgment and thought content normal.    Vital signs in last 24 hours: Temp:  [98.1 F (36.7 C)] 98.1 F (36.7 C) (11/12 0827) Pulse Rate:  [97] 97 (11/12 0827) Resp:  [18] 18 (11/12 0827) BP: (154)/(83) 154/83 (11/12  0827) SpO2:  [97 %] 97 % (11/12 0827) Weight:  [99.4 kg] 99.4 kg (11/12 0827)  Labs:   Estimated body mass index is 31.43 kg/m as calculated from the following:   Height as of 05/28/19: 5\' 10"  (1.778 m).   Weight as of 05/28/19: 99.4 kg.   Imaging Review Plain radiographs demonstrate  bone-on-bone arthritis the medial compartment of the right knee with lateral subluxation of tibia beneath femur.   Assessment/Plan:  End stage arthritis, right knee   The patient history, physical examination, clinical judgment of the provider and imaging studies are consistent with end stage degenerative joint disease of the right knee(s) and total knee arthroplasty is deemed medically necessary. The treatment options including medical management, injection therapy arthroscopy and arthroplasty were discussed at length. The risks and benefits of total knee arthroplasty were presented and reviewed. The risks due to aseptic loosening, infection, stiffness, patella tracking problems, thromboembolic complications and other imponderables were discussed. The patient acknowledged the explanation, agreed to proceed with the plan and consent was signed. Patient is being admitted for inpatient treatment for surgery, pain control, PT, OT, prophylactic antibiotics, VTE prophylaxis, progressive ambulation and ADL's and discharge planning. The patient is planning to be discharged home with home health services     Patient's anticipated LOS is less than 2 midnights, meeting these requirements: - Younger than 69 - Lives within 1 hour of care - Has a competent adult at home to recover with post-op recover - NO history of  - Chronic pain requiring opiods  - Diabetes  - Coronary Artery Disease  - Heart failure  - Heart attack  - Stroke  - DVT/VTE  - Cardiac arrhythmia  - Respiratory Failure/COPD  - Renal failure  - Anemia  - Advanced Liver disease

## 2019-05-29 LAB — NOVEL CORONAVIRUS, NAA (HOSP ORDER, SEND-OUT TO REF LAB; TAT 18-24 HRS): SARS-CoV-2, NAA: NOT DETECTED

## 2019-05-31 MED ORDER — TRANEXAMIC ACID 1000 MG/10ML IV SOLN
2000.0000 mg | INTRAVENOUS | Status: DC
  Filled 2019-05-31: qty 20

## 2019-05-31 MED ORDER — BUPIVACAINE LIPOSOME 1.3 % IJ SUSP
20.0000 mL | Freq: Once | INTRAMUSCULAR | Status: DC
  Filled 2019-05-31 (×2): qty 20

## 2019-06-01 ENCOUNTER — Observation Stay (HOSPITAL_COMMUNITY)
Admission: RE | Admit: 2019-06-01 | Discharge: 2019-06-02 | Disposition: A | Discharge status: Home / Self Care | Payer: Medicare Other | Attending: Orthopedic Surgery | Admitting: Orthopedic Surgery

## 2019-06-01 ENCOUNTER — Encounter (HOSPITAL_COMMUNITY): Payer: Self-pay

## 2019-06-01 ENCOUNTER — Telehealth (HOSPITAL_COMMUNITY): Payer: Self-pay | Admitting: *Deleted

## 2019-06-01 ENCOUNTER — Other Ambulatory Visit: Payer: Self-pay

## 2019-06-01 ENCOUNTER — Ambulatory Visit (HOSPITAL_COMMUNITY): Payer: Medicare Other | Admitting: Physician Assistant

## 2019-06-01 ENCOUNTER — Ambulatory Visit (HOSPITAL_COMMUNITY): Payer: Medicare Other | Admitting: Anesthesiology

## 2019-06-01 ENCOUNTER — Encounter (HOSPITAL_COMMUNITY)
Admission: RE | Disposition: A | Discharge status: Home / Self Care | Payer: Self-pay | Source: Home / Self Care | Attending: Orthopedic Surgery

## 2019-06-01 DIAGNOSIS — E785 Hyperlipidemia, unspecified: Secondary | ICD-10-CM | POA: Diagnosis not present

## 2019-06-01 DIAGNOSIS — K449 Diaphragmatic hernia without obstruction or gangrene: Secondary | ICD-10-CM | POA: Insufficient documentation

## 2019-06-01 DIAGNOSIS — G4700 Insomnia, unspecified: Secondary | ICD-10-CM | POA: Diagnosis not present

## 2019-06-01 DIAGNOSIS — G20 Parkinson's disease: Secondary | ICD-10-CM | POA: Insufficient documentation

## 2019-06-01 DIAGNOSIS — K2270 Barrett's esophagus without dysplasia: Secondary | ICD-10-CM | POA: Insufficient documentation

## 2019-06-01 DIAGNOSIS — M1711 Unilateral primary osteoarthritis, right knee: Principal | ICD-10-CM | POA: Diagnosis present

## 2019-06-01 DIAGNOSIS — Z8719 Personal history of other diseases of the digestive system: Secondary | ICD-10-CM | POA: Diagnosis not present

## 2019-06-01 DIAGNOSIS — Z880 Allergy status to penicillin: Secondary | ICD-10-CM | POA: Insufficient documentation

## 2019-06-01 DIAGNOSIS — Z96651 Presence of right artificial knee joint: Secondary | ICD-10-CM

## 2019-06-01 DIAGNOSIS — Z96611 Presence of right artificial shoulder joint: Secondary | ICD-10-CM | POA: Insufficient documentation

## 2019-06-01 DIAGNOSIS — M25761 Osteophyte, right knee: Secondary | ICD-10-CM | POA: Diagnosis not present

## 2019-06-01 DIAGNOSIS — R262 Difficulty in walking, not elsewhere classified: Secondary | ICD-10-CM | POA: Insufficient documentation

## 2019-06-01 DIAGNOSIS — G8918 Other acute postprocedural pain: Secondary | ICD-10-CM | POA: Diagnosis not present

## 2019-06-01 DIAGNOSIS — K219 Gastro-esophageal reflux disease without esophagitis: Secondary | ICD-10-CM | POA: Diagnosis not present

## 2019-06-01 DIAGNOSIS — Z96642 Presence of left artificial hip joint: Secondary | ICD-10-CM | POA: Insufficient documentation

## 2019-06-01 DIAGNOSIS — Z79899 Other long term (current) drug therapy: Secondary | ICD-10-CM | POA: Diagnosis not present

## 2019-06-01 DIAGNOSIS — M1612 Unilateral primary osteoarthritis, left hip: Secondary | ICD-10-CM | POA: Diagnosis not present

## 2019-06-01 HISTORY — PX: TOTAL KNEE ARTHROPLASTY: SHX125

## 2019-06-01 LAB — TYPE AND SCREEN
ABO/RH(D): O POS
Antibody Screen: NEGATIVE

## 2019-06-01 SURGERY — ARTHROPLASTY, KNEE, TOTAL
Anesthesia: Spinal | Site: Knee | Laterality: Right

## 2019-06-01 MED ORDER — PHENYLEPHRINE 40 MCG/ML (10ML) SYRINGE FOR IV PUSH (FOR BLOOD PRESSURE SUPPORT)
PREFILLED_SYRINGE | INTRAVENOUS | Status: AC
  Filled 2019-06-01: qty 10

## 2019-06-01 MED ORDER — OXYCODONE HCL 5 MG/5ML PO SOLN: 5.0000 mg | Freq: Once | ORAL | Status: DC | PRN

## 2019-06-01 MED ORDER — OXYCODONE HCL 5 MG PO TABS: 5.0000 mg | ORAL_TABLET | Freq: Once | ORAL | Status: DC | PRN

## 2019-06-01 MED ORDER — PRAMIPEXOLE DIHYDROCHLORIDE ER 1.5 MG PO TB24: 1.5000 mg | ORAL_TABLET | Freq: Every day | ORAL | Status: DC

## 2019-06-01 MED ORDER — POLYETHYLENE GLYCOL 3350 17 G PO PACK
8.5000 g | PACK | Freq: Every day | ORAL | Status: DC
  Administered 2019-06-01: 22:00:00 8.5 g via ORAL
  Filled 2019-06-01: qty 1

## 2019-06-01 MED ORDER — PANTOPRAZOLE SODIUM 40 MG PO TBEC
40.0000 mg | DELAYED_RELEASE_TABLET | Freq: Every day | ORAL | Status: DC
  Administered 2019-06-02: 40 mg via ORAL
  Filled 2019-06-01: qty 1

## 2019-06-01 MED ORDER — BUPIVACAINE LIPOSOME 1.3 % IJ SUSP
INTRAMUSCULAR | Status: DC | PRN
  Administered 2019-06-01: 20 mL

## 2019-06-01 MED ORDER — ONDANSETRON HCL 4 MG/2ML IJ SOLN
INTRAMUSCULAR | Status: DC | PRN
  Administered 2019-06-01: 4 mg via INTRAVENOUS

## 2019-06-01 MED ORDER — PROPOFOL 500 MG/50ML IV EMUL
INTRAVENOUS | Status: AC
  Filled 2019-06-01: qty 50

## 2019-06-01 MED ORDER — TRANEXAMIC ACID 1000 MG/10ML IV SOLN
INTRAVENOUS | Status: DC | PRN
  Administered 2019-06-01: 2000 mg via TOPICAL

## 2019-06-01 MED ORDER — DIPHENHYDRAMINE HCL 12.5 MG/5ML PO ELIX: 12.5000 mg | ORAL_SOLUTION | ORAL | Status: DC | PRN

## 2019-06-01 MED ORDER — DOCUSATE SODIUM 100 MG PO CAPS
100.0000 mg | ORAL_CAPSULE | Freq: Two times a day (BID) | ORAL | Status: DC
  Administered 2019-06-01 – 2019-06-02 (×2): 100 mg via ORAL
  Filled 2019-06-01 (×2): qty 1

## 2019-06-01 MED ORDER — MIDAZOLAM HCL 2 MG/2ML IJ SOLN
INTRAMUSCULAR | Status: AC
  Filled 2019-06-01: qty 2

## 2019-06-01 MED ORDER — TRANEXAMIC ACID-NACL 1000-0.7 MG/100ML-% IV SOLN
1000.0000 mg | INTRAVENOUS | Status: AC
  Administered 2019-06-01: 1000 mg via INTRAVENOUS
  Filled 2019-06-01: qty 100

## 2019-06-01 MED ORDER — LIDOCAINE 2% (20 MG/ML) 5 ML SYRINGE
INTRAMUSCULAR | Status: AC
  Filled 2019-06-01: qty 5

## 2019-06-01 MED ORDER — SODIUM CHLORIDE 0.9 % IR SOLN
Status: DC | PRN
  Administered 2019-06-01: 1000 mL

## 2019-06-01 MED ORDER — METOCLOPRAMIDE HCL 5 MG/ML IJ SOLN: 5.0000 mg | Freq: Three times a day (TID) | INTRAMUSCULAR | Status: DC | PRN

## 2019-06-01 MED ORDER — PHENOL 1.4 % MT LIQD: 1.0000 | OROMUCOSAL | Status: DC | PRN

## 2019-06-01 MED ORDER — FENTANYL CITRATE (PF) 100 MCG/2ML IJ SOLN
INTRAMUSCULAR | Status: DC | PRN
  Administered 2019-06-01: 50 ug via INTRAVENOUS
  Administered 2019-06-01 (×2): 25 ug via INTRAVENOUS

## 2019-06-01 MED ORDER — ONDANSETRON HCL 4 MG/2ML IJ SOLN: 4.0000 mg | Freq: Once | INTRAMUSCULAR | Status: DC | PRN

## 2019-06-01 MED ORDER — LACTATED RINGERS IV SOLN
INTRAVENOUS | Status: DC
  Administered 2019-06-01: 11:00:00 via INTRAVENOUS

## 2019-06-01 MED ORDER — HYDROMORPHONE HCL 1 MG/ML IJ SOLN: 0.5000 mg | INTRAMUSCULAR | Status: DC | PRN

## 2019-06-01 MED ORDER — ALUM & MAG HYDROXIDE-SIMETH 200-200-20 MG/5ML PO SUSP: 30.0000 mL | ORAL | Status: DC | PRN

## 2019-06-01 MED ORDER — DEXAMETHASONE SODIUM PHOSPHATE 10 MG/ML IJ SOLN
INTRAMUSCULAR | Status: AC
  Filled 2019-06-01: qty 1

## 2019-06-01 MED ORDER — SODIUM CHLORIDE (PF) 0.9 % IJ SOLN
INTRAMUSCULAR | Status: AC
  Filled 2019-06-01: qty 50

## 2019-06-01 MED ORDER — TIZANIDINE HCL 2 MG PO TABS: 2.0000 mg | ORAL_TABLET | Freq: Four times a day (QID) | ORAL | 0 refills | Status: DC | PRN

## 2019-06-01 MED ORDER — METHOCARBAMOL 500 MG IVPB - SIMPLE MED
500.0000 mg | Freq: Four times a day (QID) | INTRAVENOUS | Status: DC | PRN
  Filled 2019-06-01: qty 50

## 2019-06-01 MED ORDER — VANCOMYCIN HCL IN DEXTROSE 1-5 GM/200ML-% IV SOLN
1000.0000 mg | INTRAVENOUS | Status: AC
  Administered 2019-06-01: 1000 mg via INTRAVENOUS
  Filled 2019-06-01: qty 200

## 2019-06-01 MED ORDER — ACETAMINOPHEN 325 MG PO TABS: 325.0000 mg | ORAL_TABLET | Freq: Four times a day (QID) | ORAL | Status: DC | PRN

## 2019-06-01 MED ORDER — ATORVASTATIN CALCIUM 20 MG PO TABS
20.0000 mg | ORAL_TABLET | Freq: Every day | ORAL | Status: DC
  Administered 2019-06-01: 22:00:00 20 mg via ORAL
  Filled 2019-06-01: qty 1

## 2019-06-01 MED ORDER — ROPIVACAINE HCL 5 MG/ML IJ SOLN
INTRAMUSCULAR | Status: DC | PRN
  Administered 2019-06-01: 30 mL via PERINEURAL

## 2019-06-01 MED ORDER — MIDAZOLAM HCL 2 MG/2ML IJ SOLN
1.0000 mg | INTRAMUSCULAR | Status: AC
  Administered 2019-06-01: 1 mg via INTRAVENOUS
  Filled 2019-06-01: qty 2

## 2019-06-01 MED ORDER — LIDOCAINE HCL (CARDIAC) PF 100 MG/5ML IV SOSY
PREFILLED_SYRINGE | INTRAVENOUS | Status: DC | PRN
  Administered 2019-06-01: 40 mg via INTRAVENOUS

## 2019-06-01 MED ORDER — ASPIRIN 81 MG PO CHEW
81.0000 mg | CHEWABLE_TABLET | Freq: Two times a day (BID) | ORAL | Status: DC
  Administered 2019-06-01 – 2019-06-02 (×2): 81 mg via ORAL
  Filled 2019-06-01 (×2): qty 1

## 2019-06-01 MED ORDER — FENTANYL CITRATE (PF) 100 MCG/2ML IJ SOLN
50.0000 ug | INTRAMUSCULAR | Status: AC
  Administered 2019-06-01: 12:00:00 50 ug via INTRAVENOUS
  Filled 2019-06-01: qty 2

## 2019-06-01 MED ORDER — MIDAZOLAM HCL 5 MG/5ML IJ SOLN
INTRAMUSCULAR | Status: DC | PRN
  Administered 2019-06-01 (×2): 1 mg via INTRAVENOUS

## 2019-06-01 MED ORDER — PROPOFOL 500 MG/50ML IV EMUL
INTRAVENOUS | Status: DC | PRN
  Administered 2019-06-01: 50 ug/kg/min via INTRAVENOUS

## 2019-06-01 MED ORDER — OXYCODONE-ACETAMINOPHEN 5-325 MG PO TABS: 1.0000 | ORAL_TABLET | ORAL | 0 refills | Status: DC | PRN

## 2019-06-01 MED ORDER — FINASTERIDE 5 MG PO TABS
5.0000 mg | ORAL_TABLET | Freq: Every day | ORAL | Status: DC
  Administered 2019-06-01: 22:00:00 5 mg via ORAL
  Filled 2019-06-01: qty 1

## 2019-06-01 MED ORDER — METHOCARBAMOL 500 MG PO TABS
500.0000 mg | ORAL_TABLET | Freq: Four times a day (QID) | ORAL | Status: DC | PRN
  Administered 2019-06-01 – 2019-06-02 (×2): 500 mg via ORAL
  Filled 2019-06-01 (×2): qty 1

## 2019-06-01 MED ORDER — POVIDONE-IODINE 10 % EX SWAB
2.0000 "application " | Freq: Once | CUTANEOUS | Status: AC
  Administered 2019-06-01: 2 via TOPICAL

## 2019-06-01 MED ORDER — ONDANSETRON HCL 4 MG PO TABS: 4.0000 mg | ORAL_TABLET | Freq: Four times a day (QID) | ORAL | Status: DC | PRN

## 2019-06-01 MED ORDER — HYDROCHLOROTHIAZIDE 12.5 MG PO CAPS
12.5000 mg | ORAL_CAPSULE | Freq: Every day | ORAL | Status: DC
  Administered 2019-06-02: 08:00:00 12.5 mg via ORAL
  Filled 2019-06-01: qty 1

## 2019-06-01 MED ORDER — BUPIVACAINE HCL (PF) 0.25 % IJ SOLN
INTRAMUSCULAR | Status: DC | PRN
  Administered 2019-06-01: 30 mL

## 2019-06-01 MED ORDER — TAMSULOSIN HCL 0.4 MG PO CAPS
0.4000 mg | ORAL_CAPSULE | Freq: Every day | ORAL | Status: DC
  Administered 2019-06-01: 0.4 mg via ORAL
  Filled 2019-06-01: qty 1

## 2019-06-01 MED ORDER — ONDANSETRON HCL 4 MG/2ML IJ SOLN: 4.0000 mg | Freq: Four times a day (QID) | INTRAMUSCULAR | Status: DC | PRN

## 2019-06-01 MED ORDER — GABAPENTIN 300 MG PO CAPS
300.0000 mg | ORAL_CAPSULE | Freq: Three times a day (TID) | ORAL | Status: DC
  Administered 2019-06-01 (×2): 300 mg via ORAL
  Filled 2019-06-01 (×3): qty 1

## 2019-06-01 MED ORDER — DEXAMETHASONE SODIUM PHOSPHATE 4 MG/ML IJ SOLN
INTRAMUSCULAR | Status: DC | PRN
  Administered 2019-06-01: 8 mg via INTRAVENOUS

## 2019-06-01 MED ORDER — WATER FOR IRRIGATION, STERILE IR SOLN
Status: DC | PRN
  Administered 2019-06-01: 2000 mL

## 2019-06-01 MED ORDER — MENTHOL 3 MG MT LOZG: 1.0000 | LOZENGE | OROMUCOSAL | Status: DC | PRN

## 2019-06-01 MED ORDER — CHLORHEXIDINE GLUCONATE 4 % EX LIQD: 60.0000 mL | Freq: Once | CUTANEOUS | Status: DC

## 2019-06-01 MED ORDER — FAMOTIDINE 20 MG PO TABS: 10.0000 mg | ORAL_TABLET | Freq: Every evening | ORAL | Status: DC | PRN

## 2019-06-01 MED ORDER — BUPIVACAINE HCL (PF) 0.25 % IJ SOLN
INTRAMUSCULAR | Status: AC
  Filled 2019-06-01: qty 30

## 2019-06-01 MED ORDER — POLYETHYLENE GLYCOL 3350 17 G PO PACK: 17.0000 g | PACK | Freq: Every day | ORAL | Status: DC | PRN

## 2019-06-01 MED ORDER — ASPIRIN EC 81 MG PO TBEC: 81.0000 mg | DELAYED_RELEASE_TABLET | Freq: Two times a day (BID) | ORAL | 0 refills | Status: DC

## 2019-06-01 MED ORDER — SODIUM CHLORIDE (PF) 0.9 % IJ SOLN
INTRAMUSCULAR | Status: DC | PRN
  Administered 2019-06-01: 70 mL

## 2019-06-01 MED ORDER — KCL IN DEXTROSE-NACL 20-5-0.45 MEQ/L-%-% IV SOLN
INTRAVENOUS | Status: DC
  Administered 2019-06-01 – 2019-06-02 (×2): via INTRAVENOUS
  Filled 2019-06-01 (×2): qty 1000

## 2019-06-01 MED ORDER — CELECOXIB 200 MG PO CAPS
200.0000 mg | ORAL_CAPSULE | Freq: Two times a day (BID) | ORAL | Status: DC
  Administered 2019-06-01 – 2019-06-02 (×2): 200 mg via ORAL
  Filled 2019-06-01 (×2): qty 1

## 2019-06-01 MED ORDER — SODIUM CHLORIDE (PF) 0.9 % IJ SOLN
INTRAMUSCULAR | Status: AC
  Filled 2019-06-01: qty 20

## 2019-06-01 MED ORDER — FENTANYL CITRATE (PF) 100 MCG/2ML IJ SOLN
INTRAMUSCULAR | Status: AC
  Filled 2019-06-01: qty 2

## 2019-06-01 MED ORDER — ONDANSETRON HCL 4 MG/2ML IJ SOLN
INTRAMUSCULAR | Status: AC
  Filled 2019-06-01: qty 2

## 2019-06-01 MED ORDER — TRANEXAMIC ACID-NACL 1000-0.7 MG/100ML-% IV SOLN
1000.0000 mg | Freq: Once | INTRAVENOUS | Status: AC
  Administered 2019-06-01: 18:00:00 1000 mg via INTRAVENOUS
  Filled 2019-06-01: qty 100

## 2019-06-01 MED ORDER — PHENYLEPHRINE HCL (PRESSORS) 10 MG/ML IV SOLN
INTRAVENOUS | Status: DC | PRN
  Administered 2019-06-01: 80 ug via INTRAVENOUS
  Administered 2019-06-01: 40 ug via INTRAVENOUS
  Administered 2019-06-01: 80 ug via INTRAVENOUS

## 2019-06-01 MED ORDER — BISACODYL 5 MG PO TBEC: 5.0000 mg | DELAYED_RELEASE_TABLET | Freq: Every day | ORAL | Status: DC | PRN

## 2019-06-01 MED ORDER — PRAMIPEXOLE DIHYDROCHLORIDE 0.25 MG PO TABS
0.5000 mg | ORAL_TABLET | Freq: Three times a day (TID) | ORAL | Status: DC
  Administered 2019-06-01 – 2019-06-02 (×2): 0.5 mg via ORAL
  Filled 2019-06-01 (×2): qty 2

## 2019-06-01 MED ORDER — METOCLOPRAMIDE HCL 5 MG PO TABS: 5.0000 mg | ORAL_TABLET | Freq: Three times a day (TID) | ORAL | Status: DC | PRN

## 2019-06-01 MED ORDER — FLEET ENEMA 7-19 GM/118ML RE ENEM: 1.0000 | ENEMA | Freq: Once | RECTAL | Status: DC | PRN

## 2019-06-01 MED ORDER — OXYCODONE HCL 5 MG PO TABS
5.0000 mg | ORAL_TABLET | ORAL | Status: DC | PRN
  Administered 2019-06-01: 5 mg via ORAL
  Administered 2019-06-02 (×2): 10 mg via ORAL
  Filled 2019-06-01 (×2): qty 2
  Filled 2019-06-01: qty 1

## 2019-06-01 MED ORDER — FENTANYL CITRATE (PF) 100 MCG/2ML IJ SOLN: 25.0000 ug | INTRAMUSCULAR | Status: DC | PRN

## 2019-06-01 MED ORDER — PROPOFOL 10 MG/ML IV BOLUS
INTRAVENOUS | Status: AC
  Filled 2019-06-01: qty 20

## 2019-06-01 SURGICAL SUPPLY — 54 items
ATTUNE MED DOME PAT 38 KNEE (Knees) ×2 IMPLANT
ATTUNE MED DOME PAT 38MM KNEE (Knees) ×1 IMPLANT
ATTUNE PS FEM RT SZ 6 CEM KNEE (Femur) ×3 IMPLANT
ATTUNE PSRP INSR SZ6 5 KNEE (Insert) ×2 IMPLANT
ATTUNE PSRP INSR SZ6 5MM KNEE (Insert) ×1 IMPLANT
BAG DECANTER FOR FLEXI CONT (MISCELLANEOUS) ×3 IMPLANT
BAG ZIPLOCK 12X15 (MISCELLANEOUS) ×3 IMPLANT
BASE TIBIA ATTUNE KNEE SYS SZ6 (Knees) ×1 IMPLANT
BLADE SAG 18X100X1.27 (BLADE) ×3 IMPLANT
BLADE SAW SGTL 11.0X1.19X90.0M (BLADE) ×3 IMPLANT
BLADE SURG SZ10 CARB STEEL (BLADE) ×6 IMPLANT
BNDG ELASTIC 6X10 VLCR STRL LF (GAUZE/BANDAGES/DRESSINGS) ×3 IMPLANT
BNDG ELASTIC 6X15 VLCR STRL LF (GAUZE/BANDAGES/DRESSINGS) ×3 IMPLANT
BOWL SMART MIX CTS (DISPOSABLE) ×3 IMPLANT
CEMENT HV SMART SET (Cement) ×6 IMPLANT
COVER SURGICAL LIGHT HANDLE (MISCELLANEOUS) ×3 IMPLANT
COVER WAND RF STERILE (DRAPES) ×3 IMPLANT
CUFF TOURN SGL QUICK 34 (TOURNIQUET CUFF) ×2
CUFF TRNQT CYL 34X4.125X (TOURNIQUET CUFF) ×1 IMPLANT
DECANTER SPIKE VIAL GLASS SM (MISCELLANEOUS) ×9 IMPLANT
DRAPE U-SHAPE 47X51 STRL (DRAPES) ×3 IMPLANT
DRESSING AQUACEL AG SP 3.5X10 (GAUZE/BANDAGES/DRESSINGS) ×1 IMPLANT
DRSG AQUACEL AG ADV 3.5X10 (GAUZE/BANDAGES/DRESSINGS) ×3 IMPLANT
DRSG AQUACEL AG SP 3.5X10 (GAUZE/BANDAGES/DRESSINGS) ×3
DURAPREP 26ML APPLICATOR (WOUND CARE) ×3 IMPLANT
ELECT REM PT RETURN 15FT ADLT (MISCELLANEOUS) ×3 IMPLANT
GLOVE BIO SURGEON STRL SZ7.5 (GLOVE) ×3 IMPLANT
GLOVE BIO SURGEON STRL SZ8.5 (GLOVE) ×3 IMPLANT
GLOVE BIOGEL PI IND STRL 8 (GLOVE) ×1 IMPLANT
GLOVE BIOGEL PI IND STRL 9 (GLOVE) ×1 IMPLANT
GLOVE BIOGEL PI INDICATOR 8 (GLOVE) ×2
GLOVE BIOGEL PI INDICATOR 9 (GLOVE) ×2
GOWN STRL REUS W/TWL XL LVL3 (GOWN DISPOSABLE) ×6 IMPLANT
HANDPIECE INTERPULSE COAX TIP (DISPOSABLE) ×2
HOOD PEEL AWAY FLYTE STAYCOOL (MISCELLANEOUS) ×9 IMPLANT
KIT TURNOVER KIT A (KITS) IMPLANT
NEEDLE HYPO 21X1.5 SAFETY (NEEDLE) ×6 IMPLANT
NS IRRIG 1000ML POUR BTL (IV SOLUTION) ×3 IMPLANT
PACK ICE MAXI GEL EZY WRAP (MISCELLANEOUS) ×3 IMPLANT
PACK TOTAL KNEE CUSTOM (KITS) ×3 IMPLANT
PIN DRILL FIX HALF THREAD (BIT) ×3 IMPLANT
PIN STEINMAN FIXATION KNEE (PIN) ×3 IMPLANT
PROTECTOR NERVE ULNAR (MISCELLANEOUS) ×3 IMPLANT
SET HNDPC FAN SPRY TIP SCT (DISPOSABLE) ×1 IMPLANT
SUT VIC AB 1 CTX 36 (SUTURE) ×2
SUT VIC AB 1 CTX36XBRD ANBCTR (SUTURE) ×1 IMPLANT
SUT VIC AB 3-0 CT1 27 (SUTURE) ×6
SUT VIC AB 3-0 CT1 TAPERPNT 27 (SUTURE) ×3 IMPLANT
SYR CONTROL 10ML LL (SYRINGE) ×6 IMPLANT
TIBIA ATTUNE KNEE SYS BASE SZ6 (Knees) ×3 IMPLANT
TRAY FOLEY MTR SLVR 16FR STAT (SET/KITS/TRAYS/PACK) ×3 IMPLANT
WATER STERILE IRR 1000ML POUR (IV SOLUTION) ×6 IMPLANT
WRAP KNEE MAXI GEL POST OP (GAUZE/BANDAGES/DRESSINGS) ×3 IMPLANT
YANKAUER SUCT BULB TIP 10FT TU (MISCELLANEOUS) ×3 IMPLANT

## 2019-06-01 NOTE — Care Plan (Signed)
Ortho Bundle Case Management Note  Patient Details  Name: [name] [name] MRN: [id number] Date of Birth: 1950-10-05  Spoke with patient prior to surgery. He plans to discharge to home with family and HHPT provided by [facility]. He has all needed equipment at home from prior surgery. He will transition to OPPT at [facility] in [place]. All arrangements have been made. Patient and MD in agreement with plan. Choice offered.               DME Arranged:    DME Agency:     HH Arranged:  PT HH Agency:  [facility] ([facility]  Additional Comments: Please contact me with any questions of if this plan should need to change.  Ladell Heads,  [place] [facility]  [phone] 06/01/2019, 2:11 PM

## 2019-06-01 NOTE — Transfer of Care (Signed)
Immediate Anesthesia Transfer of Care Note  Patient: [name] [name]  Procedure(s) Performed: RIGHT TOTAL KNEE ARTHROPLASTY (Right Knee)  Patient Location: PACU  Anesthesia Type:Spinal and MAC combined with regional for post-op pain  Level of Consciousness: awake, alert  and patient cooperative  Airway & Oxygen Therapy: Patient Spontanous Breathing and Patient connected to face mask oxygen  Post-op Assessment: Report given to RN and Post -op Vital signs reviewed and stable  Post vital signs: Reviewed and stable  Last Vitals:  Vitals Value Taken Time  BP 101/64 06/01/19 1455  Temp    Pulse 75 06/01/19 1456  Resp 15 06/01/19 1456  SpO2 100 % 06/01/19 1456  Vitals shown include unvalidated device data.  Last Pain:  Vitals:   06/01/19 1218  TempSrc:   PainSc: 0-No pain         Complications: No apparent anesthesia complications

## 2019-06-01 NOTE — Discharge Instructions (Signed)

## 2019-06-01 NOTE — Op Note (Signed)
PATIENT ID:      [name] [name]  MRN:     [id number] DOB/AGE:    May 13, 1951 / 68 y.o.       OPERATIVE REPORT   DATE OF PROCEDURE:  06/01/2019      PREOPERATIVE DIAGNOSIS:   RIGHT KNEE OSTEOARTHRITIS      Estimated body mass index is 31.43 kg/m as calculated from the following:   Height as of 05/28/19: 5\' 10"  (1.778 m).   Weight as of 05/28/19: 99.4 kg.                                                       POSTOPERATIVE DIAGNOSIS:   Same                                                                  PROCEDURE:  Procedure(s): RIGHT TOTAL KNEE ARTHROPLASTY Using DepuyAttune RP implants #6R Femur, #6Tibia, 5 mm Attune RP bearing, 38 Patella    SURGEON: Kerin Salen  ASSISTANT:   Kerry Hough. [employer]   (Present and scrubbed throughout the case, critical for assistance with exposure, retraction, instrumentation, and closure.)        ANESTHESIA: Spinal, 20cc Exparel, 50cc 0.25% Marcaine EBL: 300 cc FLUID REPLACEMENT: 1500 cc crystaloid TOURNIQUET: DRAINS: None TRANEXAMIC ACID: 1gm IV, 2gm topical COMPLICATIONS:  None         INDICATIONS FOR PROCEDURE: The patient has  RIGHT KNEE OSTEOARTHRITIS, Var deformities, XR shows bone on bone arthritis, lateral subluxation of tibia. Patient has failed all conservative measures including anti-inflammatory medicines, narcotics, attempts at exercise and weight loss, cortisone injections and viscosupplementation.  Risks and benefits of surgery have been discussed, questions answered.   DESCRIPTION OF PROCEDURE: The patient identified by armband, received  IV antibiotics, in the holding area at [facility]. Patient taken to the operating room, appropriate anesthetic monitors were attached, and Spinal anesthesia was  induced. IV Tranexamic acid was given.Tourniquet applied high to the operative thigh. Lateral post and foot positioner applied to the table, the lower extremity was then prepped and draped in usual sterile fashion from the toes  to the tourniquet. Time-out procedure was performed. The skin and subcutaneous tissue along the incision was injected with 20 cc of a mixture of Exparel and Marcaine solution, using a 20-gauge by 1-1/2 inch needle. We began the operation, with the knee flexed 130 degrees, by making the anterior midline incision starting at handbreadth above the patella going over the patella 1 cm medial to and 4 cm distal to the tibial tubercle. Small bleeders in the skin and the subcutaneous tissue identified and cauterized. Transverse retinaculum was incised and reflected medially and a medial parapatellar arthrotomy was accomplished. the patella was everted and theprepatellar fat pad resected. The superficial medial collateral ligament was then elevated from anterior to posterior along the proximal flare of the tibia and anterior half of the menisci resected. The knee was hyperflexed exposing bone on bone arthritis. Peripheral and notch osteophytes as well as the cruciate ligaments were then resected. We continued to work our way around posteriorly along the  proximal tibia, and externally rotated the tibia subluxing it out from underneath the femur. A McHale PCL retractor was placed through the notch and a lateral Hohmann retractor placed, and we then entered the proximal tibia in line with the Depuy starter drill in line with the axis of the tibia followed by an intramedullary guide rod and 0-degree posterior slope cutting guide. The tibial cutting guide, 4 degree posterior sloped, was pinned into place allowing resection of 3 mm of bone medially and 8 mm of bone laterally. Satisfied with the tibial resection, we then entered the distal femur 2 mm anterior to the PCL origin with the intramedullary guide rod and applied the distal femoral cutting guide set at 9 mm, with 5 degrees of valgus. This was pinned along the epicondylar axis. At this point, the distal femoral cut was accomplished without difficulty. We then sized for a  #6R femoral component and pinned the guide in 3 degrees of external rotation. The chamfer cutting guide was pinned into place. The anterior, posterior, and chamfer cuts were accomplished without difficulty followed by the Attune RP box cutting guide and the box cut. We also removed posterior osteophytes from the posterior femoral condyles. The posterior capsule was injected with Exparel solution. The knee was brought into full extension. We checked our extension gap and fit a 5 mm bearing. Distracting in extension with a lamina spreader,  bleeders in the posterior capsule, Posterior medial and posterior lateral gutter were cauterized.  The transexamic acid-soaked sponge was then placed in the gap of the knee in extension. The knee was flexed 30. The posterior patella cut was accomplished with the 9.5 mm Attune cutting guide, sized for a 21mm dome, and the fixation pegs drilled.The knee was then once again hyperflexed exposing the proximal tibia. We sized for a # 6 tibial base plate, applied the smokestack and the conical reamer followed by the the Delta fin keel punch. We then hammered into place the Attune RP trial femoral component, drilled the lugs, inserted a  5 mm trial bearing, trial patellar button, and took the knee through range of motion from 0-130 degrees. Medial and lateral ligamentous stability was checked. No thumb pressure was required for patellar Tracking. The tourniquet was not used. All trial components were removed, mating surfaces irrigated with pulse lavage, and dried with suction and sponges. 10 cc of the Exparel solution was applied to the cancellus bone of the patella distal femur and proximal tibia.  After waiting 30 seconds, the bony surfaces were again, dried with sponges. A double batch of DePuy HV cement was mixed and applied to all bony metallic mating surfaces except for the posterior condyles of the femur itself. In order, we hammered into place the tibial tray and removed excess  cement, the femoral component and removed excess cement. The final Attune RP bearing was inserted, and the knee brought to full extension with compression. The patellar button was clamped into place, and excess cement removed. The knee was held at 30 flexion with compression, while the cement cured. The wound was irrigated out with normal saline solution pulse lavage. The rest of the Exparel was injected into the parapatellar arthrotomy, subcutaneous tissues, and periosteal tissues. The parapatellar arthrotomy was closed with running #1 Vicryl suture. The subcutaneous tissue with 0 and 2-0 undyed Vicryl suture, and the skin with running 3-0 SQ vicryl. An Aquacil and Ace wrap were applied. The patient was taken to recovery room without difficulty.   Kerin Salen 06/01/2019, 7:07  AM

## 2019-06-01 NOTE — Anesthesia Procedure Notes (Signed)
Performed by: Flynn-Cook, Aydien Majette A, CRNA       

## 2019-06-01 NOTE — Anesthesia Postprocedure Evaluation (Signed)
Anesthesia Post Note  Patient: [name] [name]  Procedure(s) Performed: RIGHT TOTAL KNEE ARTHROPLASTY (Right Knee)     Patient location during evaluation: PACU Anesthesia Type: Spinal Level of consciousness: oriented and awake and alert Pain management: pain level controlled Vital Signs Assessment: post-procedure vital signs reviewed and stable Respiratory status: spontaneous breathing, respiratory function stable and nonlabored ventilation Cardiovascular status: blood pressure returned to baseline and stable Postop Assessment: no headache, no backache, no apparent nausea or vomiting and spinal receding Anesthetic complications: no    Last Vitals:  Vitals:   06/01/19 1554 06/01/19 1605  BP: 108/86 114/84  Pulse: 79 78  Resp: 18 15  Temp: 36.6 C 36.7 C  SpO2: 100% 100%    Last Pain:  Vitals:   06/01/19 1605  TempSrc: Axillary  PainSc: 0-No pain                 Lidia Collum

## 2019-06-01 NOTE — Interval H&P Note (Signed)
History and Physical Interval Note:  06/01/2019 11:10 AM  [name] [name]  has presented today for surgery, with the diagnosis of RIGHT KNEE OSTEOARTHRITIS.  The various methods of treatment have been discussed with the patient and family. After consideration of risks, benefits and other options for treatment, the patient has consented to  Procedure(s): RIGHT TOTAL KNEE ARTHROPLASTY (Right) as a surgical intervention.  The patient's history has been reviewed, patient examined, no change in status, stable for surgery.  I have reviewed the patient's chart and labs.  Questions were answered to the patient's satisfaction.     Kerin Salen

## 2019-06-01 NOTE — Progress Notes (Signed)
AssistedDr. Carolyn Witman with right, ultrasound guided, adductor canal block. Side rails up, monitors on throughout procedure. See vital signs in flow sheet. Tolerated Procedure well.  

## 2019-06-01 NOTE — Anesthesia Procedure Notes (Signed)
Spinal  Start time: 06/01/2019 1:00 PM End time: 06/01/2019 1:15 PM Staffing Other anesthesia staff: Garrel Ridgel, CRNA Performed: other anesthesia staff  Preanesthetic Checklist Completed: patient identified, site marked, surgical consent, pre-op evaluation, timeout performed, IV checked, risks and benefits discussed and monitors and equipment checked Spinal Block Patient position: sitting Prep: DuraPrep Patient monitoring: heart rate, continuous pulse ox and blood pressure Approach: midline Location: L3-4 Injection technique: single-shot Needle Needle type: Pencan  Needle gauge: 24 G Needle length: 9 cm Needle insertion depth: 5 cm Assessment Sensory level: T10

## 2019-06-01 NOTE — Anesthesia Procedure Notes (Signed)
Anesthesia Regional Block: Adductor canal block   Pre-Anesthetic Checklist: ,, timeout performed, Correct Patient, Correct Site, Correct Laterality, Correct Procedure, Correct Position, site marked, Risks and benefits discussed,  Surgical consent,  Pre-op evaluation,  At surgeon's request and post-op pain management  Laterality: Right  Prep: chloraprep       Needles:  Injection technique: Single-shot  Needle Type: Echogenic Stimulator Needle     Needle Length: 10cm  Needle Gauge: 21     Additional Needles:   Procedures:,,,, ultrasound used (permanent image in chart),,,,  Narrative:  Start time: 06/01/2019 12:04 PM End time: 06/01/2019 12:07 PM Injection made incrementally with aspirations every 5 mL.  Performed by: Personally  Anesthesiologist: Lidia Collum, MD  Additional Notes: Monitors applied. Injection made in 5cc increments. No resistance to injection. Good needle visualization. Patient tolerated procedure well.

## 2019-06-01 NOTE — Anesthesia Preprocedure Evaluation (Addendum)
Anesthesia Evaluation  Patient identified by MRN, date of birth, ID band Patient awake    Reviewed: Allergy & Precautions, NPO status , Patient's Chart, lab work & pertinent test results  History of Anesthesia Complications (+) PONVNegative for: history of anesthetic complications  Airway Mallampati: II  TM Distance: >3 FB Neck ROM: Full    Dental  (+) Teeth Intact   Pulmonary neg pulmonary ROS,    Pulmonary exam normal        Cardiovascular negative cardio ROS Normal cardiovascular exam     Neuro/Psych Parkinson's negative psych ROS   GI/Hepatic Neg liver ROS, hiatal hernia, GERD  Medicated,  Endo/Other  negative endocrine ROS  Renal/GU negative Renal ROS  negative genitourinary   Musculoskeletal  (+) Arthritis ,   Abdominal   Peds  Hematology negative hematology ROS (+)   Anesthesia Other Findings plt 180, INR 1.0, no AC, no bleeding disorders  EKG shows possible inferior infarct- this has been stable for at least one year, and per pt for decades; neg stress test 2010  Reproductive/Obstetrics                            Anesthesia Physical Anesthesia Plan  ASA: II  Anesthesia Plan: Spinal   Post-op Pain Management:  Regional for Post-op pain   Induction:   PONV Risk Score and Plan: 2 and Propofol infusion, Treatment may vary due to age or medical condition, Ondansetron and TIVA  Airway Management Planned: Nasal Cannula and Simple Face Mask  Additional Equipment: None  Intra-op Plan:   Post-operative Plan:   Informed Consent: I have reviewed the patients History and Physical, chart, labs and discussed the procedure including the risks, benefits and alternatives for the proposed anesthesia with the patient or authorized representative who has indicated his/her understanding and acceptance.       Plan Discussed with:   Anesthesia Plan Comments:        Anesthesia  Quick Evaluation

## 2019-06-02 ENCOUNTER — Encounter (HOSPITAL_COMMUNITY): Payer: Self-pay | Admitting: Orthopedic Surgery

## 2019-06-02 DIAGNOSIS — M1711 Unilateral primary osteoarthritis, right knee: Secondary | ICD-10-CM | POA: Diagnosis not present

## 2019-06-02 DIAGNOSIS — G20 Parkinson's disease: Secondary | ICD-10-CM | POA: Diagnosis not present

## 2019-06-02 DIAGNOSIS — K219 Gastro-esophageal reflux disease without esophagitis: Secondary | ICD-10-CM | POA: Diagnosis not present

## 2019-06-02 DIAGNOSIS — E785 Hyperlipidemia, unspecified: Secondary | ICD-10-CM | POA: Diagnosis not present

## 2019-06-02 DIAGNOSIS — R262 Difficulty in walking, not elsewhere classified: Secondary | ICD-10-CM | POA: Diagnosis not present

## 2019-06-02 DIAGNOSIS — M25761 Osteophyte, right knee: Secondary | ICD-10-CM | POA: Diagnosis not present

## 2019-06-02 LAB — CBC
HCT: 38.8 % — ABNORMAL LOW (ref 39.0–52.0)
Hemoglobin: 13.5 g/dL (ref 13.0–17.0)
MCH: 31.3 pg (ref 26.0–34.0)
MCHC: 34.8 g/dL (ref 30.0–36.0)
MCV: 90 fL (ref 80.0–100.0)
Platelets: 205 10*3/uL (ref 150–400)
RBC: 4.31 MIL/uL (ref 4.22–5.81)
RDW: 12.8 % (ref 11.5–15.5)
WBC: 12.5 10*3/uL — ABNORMAL HIGH (ref 4.0–10.5)
nRBC: 0 % (ref 0.0–0.2)

## 2019-06-02 LAB — BASIC METABOLIC PANEL
Anion gap: 7 (ref 5–15)
BUN: 15 mg/dL (ref 8–23)
CO2: 23 mmol/L (ref 22–32)
Calcium: 8.4 mg/dL — ABNORMAL LOW (ref 8.9–10.3)
Chloride: 106 mmol/L (ref 98–111)
Creatinine, Ser: 0.8 mg/dL (ref 0.61–1.24)
GFR calc Af Amer: >60 mL/min (ref >60)
GFR calc non Af Amer: >60 mL/min (ref >60)
Glucose, Bld: 146 mg/dL — ABNORMAL HIGH (ref 70–99)
Potassium: 3.5 mmol/L (ref 3.5–5.1)
Sodium: 136 mmol/L (ref 135–145)

## 2019-06-02 MED ORDER — GABAPENTIN 100 MG PO CAPS
100.0000 mg | ORAL_CAPSULE | Freq: Three times a day (TID) | ORAL | Status: DC
  Administered 2019-06-02: 08:00:00 100 mg via ORAL
  Filled 2019-06-02: qty 1

## 2019-06-02 NOTE — Progress Notes (Signed)
PATIENT ID: [name] [name]  MRN: [id number]  DOB/AGE:  [birth year] / 68 y.o.  1 Day Post-Op Procedure(s) (LRB): RIGHT TOTAL KNEE ARTHROPLASTY (Right)    PROGRESS NOTE Subjective: Patient is alert, oriented, no Nausea, no Vomiting, yes passing gas. Taking PO well. Denies SOB, Chest or Calf Pain. Using Incentive Spirometer, PAS in place. Ambulate WBAT, Patient reports pain as 2/10 .    Objective: Vital signs in last 24 hours: Vitals:   06/01/19 1905 06/01/19 2015 06/02/19 0108 06/02/19 0441  BP: 118/84 (Abnormal) 149/87 128/85 (Abnormal) 141/89  Pulse: 100 (Abnormal) 103 96 91  Resp: 17 16 18 16   Temp: 98 F (36.7 C) 98.4 F (36.9 C) 98.3 F (36.8 C) 97.9 F (36.6 C)  TempSrc: Oral Oral Oral Oral  SpO2: 94% 94% 93% 96%  Weight:      Height:          Intake/Output from previous day: I/O last 3 completed shifts: In: 4000.4 [P.O.:1020; I.V.:2580.4; IV Piggyback:400] Out: 2575 [Urine:2475; Blood:100]   Intake/Output this shift: No intake/output data recorded.   LABORATORY DATA: Recent Labs    06/02/19 0313  WBC 12.5*  HGB 13.5  HCT 38.8*  PLT 205  NA 136  K 3.5  CL 106  CO2 23  BUN 15  CREATININE 0.80  GLUCOSE 146*  CALCIUM 8.4*    Examination: Neurologically intact ABD soft Neurovascular intact Sensation intact distally Intact pulses distally Dorsiflexion/Plantar flexion intact Incision: dressing C/D/I No cellulitis present Compartment soft}  Assessment:   1 Day Post-Op Procedure(s) (LRB): RIGHT TOTAL KNEE ARTHROPLASTY (Right) ADDITIONAL DIAGNOSIS: Expected Acute Blood Loss Anemia, Sciatica, GERD, BPH,  Patient's anticipated LOS is less than 2 midnights, meeting these requirements: - Younger than 67 - Lives within 1 hour of care - Has a competent adult at home to recover with post-op recover - NO history of  - Chronic pain requiring opiods  - Diabetes  - Coronary Artery Disease  - Heart failure  - Heart attack  - Stroke  - DVT/VTE  -  Cardiac arrhythmia  - Respiratory Failure/COPD  - Renal failure  - Anemia  - Advanced Liver disease       Plan: PT/OT WBAT, AROM and PROM  DVT Prophylaxis:  SCDx72hrs, ASA 81 mg BID x 2 weeks DISCHARGE PLAN: Home, When patient passes physical therapy, probably today DISCHARGE NEEDS: HHPT, Walker and 3-in-1 comode seat     Kerin Salen 06/02/2019, 8:06 AM Patient ID: [name] [name], male   DOB: [birth year], 68 y.o.   MRN: [id number]

## 2019-06-02 NOTE — Discharge Summary (Signed)
Patient ID: [name] [name] MRN: [id number] DOB/AGE: 01-09-1951 68 y.o.  Admit date: 06/01/2019 Discharge date: 06/02/2019  Admission Diagnoses:  Principal Problem:   Osteoarthritis of right knee Active Problems:   S/P total knee replacement, right   Discharge Diagnoses:  Same  Past Medical History:  Diagnosis Date  . Arthritis   . Barrett's esophagus    sees Dr. Delfin Edis   . BPH with urinary obstruction   . Complication of anesthesia    Urinary retention. "fights it"- is what he was told. 1990's - vomitted and aspirated in surgery  . Diverticulosis   . Esophageal dysmotility   . GERD (gastroesophageal reflux disease)    not since [facility] Fund[facility]  . History of hiatal hernia   . History of kidney stones    15 in 2015- last one was in Oct 2015- takes HCTZ to help prevent  . Hyperlipidemia   . Insomnia   . Lyme disease 2013  . Parkinson's disease [facility])    sees Dr. Wells Guiles Tat   . PONV (postoperative nausea and vomiting)   . RMS[facility] spotted fever) 2012    Surgeries: Procedure(s): RIGHT TOTAL KNEE ARTHROPLASTY on 06/01/2019   Consultants:   Discharged Condition: Improved  Hospital Course: KAMERAN LASSWELL is an 68 y.o. male who was admitted 06/01/2019 for operative treatment ofOsteoarthritis of right knee. Patient has severe unremitting pain that affects sleep, daily activities, and work/hobbies. After pre-op clearance the patient was taken to the operating room on 06/01/2019 and underwent  Procedure(s): RIGHT TOTAL KNEE ARTHROPLASTY.    Patient was given perioperative antibiotics:  Anti-infectives (From admission, onward)   Start     Dose/Rate Route Frequency Ordered Stop   06/01/19 1015  vancomycin (VANCOCIN) IVPB 1000 mg/200 mL premix     1,000 mg 200 mL/hr over 60 Minutes Intravenous On call to O.R. 06/01/19 1001 06/01/19 1327       Patient was given sequential compression devices, early ambulation, and chemoprophylaxis to prevent  DVT.  Patient benefited maximally from hospital stay and there were no complications.    Recent vital signs:  Patient Vitals for the past 24 hrs:  BP Temp Temp src Pulse Resp SpO2 Height Weight  06/02/19 0441 (Abnormal) 141/89 97.9 F (36.6 C) Oral 91 16 96 % no documentation no documentation  06/02/19 0108 128/85 98.3 F (36.8 C) Oral 96 18 93 % no documentation no documentation  06/01/19 2015 (Abnormal) 149/87 98.4 F (36.9 C) Oral (Abnormal) 103 16 94 % no documentation no documentation  06/01/19 1905 118/84 98 F (36.7 C) Oral 100 17 94 % no documentation no documentation  06/01/19 1813 127/81 98.7 F (37.1 C) Oral (Abnormal) 102 17 95 % no documentation no documentation  06/01/19 1708 123/81 98.2 F (36.8 C) Oral 93 15 97 % no documentation no documentation  06/01/19 1605 114/84 98 F (36.7 C) Axillary 78 15 100 % 5\' 10"  (1.778 m) 99.8 kg  06/01/19 1554 108/86 97.9 F (36.6 C) no documentation 79 18 100 % no documentation no documentation  06/01/19 1545 100/85 no documentation no documentation 79 14 100 % no documentation no documentation  06/01/19 1530 109/76 no documentation no documentation 85 17 96 % no documentation no documentation  06/01/19 1515 101/74 no documentation no documentation 80 19 97 % no documentation no documentation  06/01/19 1500 105/73 no documentation no documentation 76 (Abnormal) 21 100 % no documentation no documentation  06/01/19 1455 101/64 (Abnormal) 97.5 F (36.4 C) no documentation 66  15 100 % no documentation no documentation  06/01/19 1218 (Abnormal) 123/91 no documentation no documentation (Abnormal) 102 (Abnormal) 21 96 % no documentation no documentation  06/01/19 1213 132/88 no documentation no documentation (Abnormal) 107 16 97 % no documentation no documentation  06/01/19 1208 116/85 no documentation no documentation (Abnormal) 101 19 97 % no documentation no documentation  06/01/19 1203 (Abnormal) 141/91 no documentation no documentation  (Abnormal) 103 (Abnormal) 24 94 % no documentation no documentation  06/01/19 1029 (Abnormal) 135/103 98.4 F (36.9 C) Oral (Abnormal) 110 18 96 % no documentation no documentation     Recent laboratory studies:  Recent Labs    06/02/19 0313  WBC 12.5*  HGB 13.5  HCT 38.8*  PLT 205  NA 136  K 3.5  CL 106  CO2 23  BUN 15  CREATININE 0.80  GLUCOSE 146*  CALCIUM 8.4*     Discharge Medications:   Allergies as of 06/02/2019    Allergen Reactions Comment   Penicillins Rash, Other (See Comments) Did it involve swelling of the face/tongue/throat, SOB, or low BP? No Did it involve sudden or severe rash/hives, skin peeling, or any reaction on the inside of your mouth or nose? No Did you need to seek medical attention at a hospital or doctor's office? No When did it last happen?childhood allergy If all above answers are "NO", may proceed with cephalosporin use.      Medication List    Stop taking these medications   ibuprofen 200 MG tablet Commonly known as: ADVIL   traMADol 50 MG tablet Commonly known as: ULTRAM     Take these medications   aspirin EC 81 MG tablet Take 1 tablet (81 mg total) by mouth 2 (two) times daily.   atorvastatin 20 MG tablet Commonly known as: LIPITOR Take 1 tablet (20 mg total) by mouth daily. What changed: when to take this   famotidine 10 MG tablet Commonly known as: PEPCID Take 10 mg by mouth at bedtime as needed for heartburn or indigestion.   famotidine 20 MG tablet Commonly known as: Pepcid Take 1 tablet (20 mg total) by mouth at bedtime.   finasteride 5 MG tablet Commonly known as: PROSCAR Take 1 tablet (5 mg total) by mouth daily. What changed: when to take this   hydrochlorothiazide 12.5 MG capsule Commonly known as: MICROZIDE Take 1 capsule (12.5 mg total) by mouth daily. What changed: when to take this   omeprazole 40 MG capsule Commonly known as: PRILOSEC Take 1 capsule (40 mg total) by mouth daily before  breakfast.   ondansetron 8 MG tablet Commonly known as: Zofran Take 1 tablet (8 mg total) by mouth every 8 (eight) hours as needed for nausea or vomiting.   oxyCODONE-acetaminophen 5-325 MG tablet Commonly known as: PERCOCET/ROXICET Take 1 tablet by mouth every 4 (four) hours as needed for severe pain.   polyethylene glycol 17 g packet Commonly known as: MIRALAX / GLYCOLAX Take 8.5 g by mouth at bedtime.   Pramipexole Dihydrochloride 1.5 MG Tb24 Commonly known as: Mirapex ER Take 1 tablet (1.5 mg total) by mouth daily. What changed: when to take this   tamsulosin 0.4 MG Caps capsule Commonly known as: FLOMAX Take 1 capsule (0.4 mg total) by mouth daily. What changed: when to take this   tiZANidine 2 MG tablet Commonly known as: ZANAFLEX Take 1 tablet (2 mg total) by mouth every 6 (six) hours as needed.        Durable Medical Equipment  (From  admission, onward)         Start     Ordered   06/01/19 1611  DME Walker rolling  Once    Question:  Patient needs a walker to treat with the following condition  Answer:  Status post right knee replacement   06/01/19 1611   06/01/19 1611  DME 3 n 1  Once     06/01/19 1611           Discharge Care Instructions  (From admission, onward)         Start     Ordered   06/02/19 0000  Change dressing    Comments: Change dressing Only if drainage exceeds 40% of window on dressing   06/02/19 0810          Diagnostic Studies: Dg Chest 2 View  Result Date: 05/28/2019 CLINICAL DATA:  Preoperative exam for knee replacement. EXAM: CHEST - 2 VIEW COMPARISON:  05/20/2018. FINDINGS: Mediastinum hilar structures normal. Right base atelectasis/infiltrate. No pleural effusion or pneumothorax. Heart size normal. Thoracic spine scoliosis and degenerative change. Total right shoulder placement. IMPRESSION: Right base atelectasis/infiltrate. Follow-up exam suggested to demonstrate clearing. These results will be called to the ordering  clinician or representative by the Radiologist Assistant, and communication documented in the PACS or zVision Dashboard. Electronically Signed   By: Marcello Moores  Register   On: 05/28/2019 11:48    Disposition: Discharge disposition: 01-Home or Self Care       Discharge Instructions    Call MD / Call 911   Complete by: As directed    If you experience chest pain or shortness of breath, CALL 911 and be transported to the hospital emergency room.  If you develope a fever above 101 F, pus (white drainage) or increased drainage or redness at the wound, or calf pain, call your surgeon's office.   Change dressing   Complete by: As directed    Change dressing Only if drainage exceeds 40% of window on dressing   Constipation Prevention   Complete by: As directed    Drink plenty of fluids.  Prune juice may be helpful.  You may use a stool softener, such as Colace (over the counter) 100 mg twice a day.  Use MiraLax (over the counter) for constipation as needed.   Diet - low sodium heart healthy   Complete by: As directed    Increase activity slowly as tolerated   Complete by: As directed       Follow-up Information    Frederik Pear, MD. Go on 06/16/2019.   Specialty: Orthopedic Surgery Why: Your appointment has been made for 10:15.  Contact information: [place] [address] [phone]        [facility] Follow up.   Specialty: [place] Why: You will be seen at home for 5 HHPT visits prior to starting outpatient physical therapy  Contact information: [place] [address] [phone]        [facility]. Go on 06/17/2019.   Why: Youi are scheduled to start Outpatient physical therapy on 12/2. The facility was to call you to arrange a time. Please let Renee Angiulli, RNCM know if you have not heard from them.  [phone]  Contact information: [place] [address] [phone]         Frederik Pear, MD In 2 weeks.   Specialty: Orthopedic Surgery Contact information: [place] [address] [phone]  Signed: Kerin Salen 06/02/2019, 8:13 AM

## 2019-06-02 NOTE — Evaluation (Signed)
Physical Therapy Evaluation Patient Details Name: [name] [name] MRN: [id number] DOB: [birth year] Today's Date: 06/02/2019   History of Present Illness  [name] [name] is an 68 y.o. male who was admitted 06/01/2019 for operative treatment of Osteoarthritis of right knee. S/P R TKR  Clinical Impression  Pt presents with decreased mobility secondary to the above diagnosis. Pt is very motivated and ready to d/c home when ready. Pt is currently supervision with basic mobility on the unit. Pt educated on HEP. Reinforced need to slow down and allow time to recover. Pt is eager to return to yard work and exercising. Pt completed step training with supervision and feel pt is safe and ready to d/c with his wife providing assistance as needed. All acute PT education is complete and pt will continue with therapy per surgeon's recommendations. Pt is d/c from acute PT services.    Follow Up Recommendations Follow surgeon's recommendation for DC plan and follow-up therapies    Equipment Recommendations  None recommended by PT    Recommendations for Other Services       Precautions / Restrictions Precautions Precautions: Knee Restrictions Weight Bearing Restrictions: No      Mobility  Bed Mobility               General bed mobility comments: NT-pt reported he had no difficulty getting out of his hospital bed. Pt is able to perform an Indep. SLR therefore did not require him to demonstrate bed mobility.  Transfers Overall transfer level: Needs assistance Equipment used: Rolling walker (2 wheeled) Transfers: Sit to/from Stand Sit to Stand: Supervision         General transfer comment: cues for increased safety with hand placement and backing up all the way and reaching hands back to sit. Pt has R hand tremor at times and siatica that affects his balance therefore reinforced to slow down and use UE for added safety.  Ambulation/Gait Ambulation/Gait assistance: Modified  independent (Device/Increase time) Gait Distance (Feet): 400 Feet Assistive device: Rolling walker (2 wheeled) Gait Pattern/deviations: Step-through pattern;Decreased stride length Gait velocity: good      Stairs Stairs: Yes Stairs assistance: Supervision Stair Management: One rail Right Number of Stairs: 3    Wheelchair Mobility    Modified Rankin (Stroke Patients Only)       Balance Overall balance assessment: Needs assistance Sitting-balance support: No upper extremity supported Sitting balance-Leahy Scale: Normal     Standing balance support: Bilateral upper extremity supported Standing balance-Leahy Scale: Fair                               Pertinent Vitals/Pain Pain Assessment: 0-10 Pain Score: 2  Pain Location: R knee Pain Descriptors / Indicators: Discomfort Pain Intervention(s): Limited activity within patient's tolerance;Monitored during session;Ice applied    Home Living Family/patient expects to be discharged to:: Private residence Living Arrangements: Spouse/significant other Available Help at Discharge: Family;Available 24 hours/day Type of Home: House Home Access: Stairs to enter Entrance Stairs-Rails: Right Entrance Stairs-Number of Steps: 3 Home Layout: Multi-level;Able to live on main level with bedroom/bathroom Home Equipment: Gilford Rile - 2 wheels      Prior Function Level of Independence: Independent               Hand Dominance        Extremity/Trunk Assessment   Upper Extremity Assessment Upper Extremity Assessment: Defer to OT evaluation    Lower Extremity Assessment Lower Extremity Assessment:  RLE deficits/detail RLE Deficits / Details: Knee 3/5 ROM approximately 80degrees flexion       Communication   Communication: No difficulties  Cognition Arousal/Alertness: Awake/alert Behavior During Therapy: WFL for tasks assessed/performed Overall Cognitive Status: Within Functional Limits for tasks assessed                                  General Comments: Pt was very active and eager to get back to yard work and exercise.      General Comments      Exercises Total Joint Exercises Ankle Circles/Pumps: AROM;Strengthening;Both;Seated Quad Sets: AROM;Strengthening;Right;10 reps;Seated Heel Slides: AROM;Strengthening;Right;10 reps;Seated Hip ABduction/ADduction: AROM;Strengthening;Right;Seated Straight Leg Raises: AROM;Strengthening;Right;10 reps;Seated Long Arc Quad: AROM;Strengthening;Right;10 reps;Seated Knee Flexion: AROM;Strengthening;Right;10 reps;Standing   Assessment/Plan    PT Assessment All further PT needs can be met in the next venue of care  PT Problem List Decreased strength;Decreased balance;Decreased range of motion;Decreased mobility;Decreased activity tolerance;Decreased safety awareness       PT Treatment Interventions      PT Goals (Current goals can be found in the Care Plan section)  Acute Rehab PT Goals Patient Stated Goal: To do yard work again    Frequency     Barriers to discharge        Co-evaluation               AM-PAC PT "6 Clicks" Mobility  Outcome Measure Help needed turning from your back to your side while in a flat bed without using bedrails?: None Help needed moving from lying on your back to sitting on the side of a flat bed without using bedrails?: None Help needed moving to and from a bed to a chair (including a wheelchair)?: A Little Help needed standing up from a chair using your arms (e.g., wheelchair or bedside chair)?: A Little Help needed to walk in hospital room?: A Little Help needed climbing 3-5 steps with a railing? : A Little 6 Click Score: 20    End of Session Equipment Utilized During Treatment: Gait belt Activity Tolerance: Patient tolerated treatment well Patient left: in chair;with call bell/phone within reach Nurse Communication: Mobility status PT Visit Diagnosis: Difficulty in walking, not  elsewhere classified (R26.2)    Time: 2500-3704 PT Time Calculation (min) (ACUTE ONLY): 28 min   Charges:   PT Evaluation $PT Eval Moderate Complexity: 1 Mod PT Treatments $Gait Training: 8-22 mins        Lelon Mast 06/02/2019, 10:33 AM

## 2019-06-02 NOTE — Plan of Care (Signed)
Patient is discharged home in stable condition

## 2019-06-03 DIAGNOSIS — M1612 Unilateral primary osteoarthritis, left hip: Secondary | ICD-10-CM | POA: Diagnosis not present

## 2019-06-03 DIAGNOSIS — G4700 Insomnia, unspecified: Secondary | ICD-10-CM | POA: Diagnosis not present

## 2019-06-03 DIAGNOSIS — Z96651 Presence of right artificial knee joint: Secondary | ICD-10-CM | POA: Diagnosis not present

## 2019-06-03 DIAGNOSIS — K219 Gastro-esophageal reflux disease without esophagitis: Secondary | ICD-10-CM | POA: Diagnosis not present

## 2019-06-03 DIAGNOSIS — N138 Other obstructive and reflux uropathy: Secondary | ICD-10-CM | POA: Diagnosis not present

## 2019-06-03 DIAGNOSIS — Z471 Aftercare following joint replacement surgery: Secondary | ICD-10-CM | POA: Diagnosis not present

## 2019-06-03 DIAGNOSIS — E785 Hyperlipidemia, unspecified: Secondary | ICD-10-CM | POA: Diagnosis not present

## 2019-06-03 DIAGNOSIS — N401 Enlarged prostate with lower urinary tract symptoms: Secondary | ICD-10-CM | POA: Diagnosis not present

## 2019-06-03 DIAGNOSIS — K2270 Barrett's esophagus without dysplasia: Secondary | ICD-10-CM | POA: Diagnosis not present

## 2019-06-03 DIAGNOSIS — G20 Parkinson's disease: Secondary | ICD-10-CM | POA: Diagnosis not present

## 2019-06-05 ENCOUNTER — Other Ambulatory Visit: Payer: Self-pay

## 2019-06-13 DIAGNOSIS — R600 Localized edema: Secondary | ICD-10-CM | POA: Diagnosis not present

## 2019-06-13 DIAGNOSIS — Z471 Aftercare following joint replacement surgery: Secondary | ICD-10-CM | POA: Diagnosis not present

## 2019-06-13 DIAGNOSIS — M7989 Other specified soft tissue disorders: Secondary | ICD-10-CM | POA: Diagnosis not present

## 2019-06-13 DIAGNOSIS — Z96651 Presence of right artificial knee joint: Secondary | ICD-10-CM | POA: Diagnosis not present

## 2019-06-13 DIAGNOSIS — L03115 Cellulitis of right lower limb: Secondary | ICD-10-CM | POA: Diagnosis not present

## 2019-06-16 DIAGNOSIS — Z96651 Presence of right artificial knee joint: Secondary | ICD-10-CM | POA: Diagnosis not present

## 2019-06-16 DIAGNOSIS — Z471 Aftercare following joint replacement surgery: Secondary | ICD-10-CM | POA: Diagnosis not present

## 2019-06-18 ENCOUNTER — Other Ambulatory Visit: Payer: Self-pay

## 2019-06-18 ENCOUNTER — Telehealth: Payer: Medicare Other | Admitting: Family Medicine

## 2019-06-19 ENCOUNTER — Ambulatory Visit: Payer: Medicare Other | Admitting: Family Medicine

## 2019-06-19 ENCOUNTER — Ambulatory Visit (INDEPENDENT_AMBULATORY_CARE_PROVIDER_SITE_OTHER): Payer: Medicare Other | Admitting: Family Medicine

## 2019-06-19 ENCOUNTER — Encounter: Payer: Self-pay | Admitting: Family Medicine

## 2019-06-19 ENCOUNTER — Telehealth: Payer: Self-pay | Admitting: Family Medicine

## 2019-06-19 VITALS — BP 110/80 | HR 104 | Temp 97.8°F | Ht 70.0 in | Wt 232.0 lb

## 2019-06-19 DIAGNOSIS — L03115 Cellulitis of right lower limb: Secondary | ICD-10-CM

## 2019-06-19 DIAGNOSIS — M79604 Pain in right leg: Secondary | ICD-10-CM

## 2019-06-19 MED ORDER — OXYCODONE HCL 10 MG PO TABS: 10.0000 mg | ORAL_TABLET | ORAL | 0 refills | Status: DC | PRN

## 2019-06-19 MED ORDER — OXYCODONE HCL 10 MG PO TABS: 10.0000 mg | ORAL_TABLET | ORAL | 0 refills | Status: AC | PRN

## 2019-06-19 NOTE — Telephone Encounter (Signed)
Patient notified of update  and verbalized understanding. 

## 2019-06-19 NOTE — Telephone Encounter (Signed)
I sent this to the new pharmacy  

## 2019-06-19 NOTE — Telephone Encounter (Signed)
Pt spouse stated that the medication Oxy was sent to the pharmacy but the pharmacy does not have the medication. Pt spouse wants this medication transferred to [place].

## 2019-06-19 NOTE — Telephone Encounter (Signed)
Patient's wife is calling to check on the status of this request. If it had been resent to the new pharmacy as of yet. Please advise [phone]

## 2019-06-19 NOTE — Telephone Encounter (Signed)
Pt's pharmacy stated they do not have Oxycodone HCl 10 MG TABS  In stock. Requesting rx to be transferred to  [facility] [place], [place] [phone] (Phone) [phone] (Fax)

## 2019-06-19 NOTE — Progress Notes (Signed)
   Subjective:    Patient ID: [name] [name], male    DOB: 1950-10-21, 68 y.o.   MRN: [id number]  HPI Here to follow up on swelling and cellulitis in the right leg. His problems began in early November when he had an arthroscopy per Dr. Ronnie [name] at [facility] to repair a torn meniscus and to remove bone spurs. This surgery did not go well and he developed swelling and significant pain in the knee as he began rehab. He eventually saw Dr. Frederik [name] for another opinion, and an MRI revealed new meniscal tears and even a stress fracture. Consequently on 06-01-19 he had a right total knee replacement. The surgery seems to have gone well, but [name] [name] has struggled with swelling and severe pain in the leg and foot since then. He developed cellulitis in the leg and he was treated for awhile with Cephalexin. This was not working so he went to the ER at [facility] on 06-06-19. An [place] was taken of the leg and he was ruled out for a DVT. He was switched to Clindamycin which he has taken for the past 3 days.  He still has severe pain in the leg and foot, but he has never had fever. He knows that staying off his feet and elevating the leg are what is needed to get the swelling down, but this aggravates his sciatica and this causes great pain. He actually feels better when standing or walking. His rehab has been halted for now while the cellulitis is being treated. No chest pain or SOB.    Review of Systems  Constitutional: Negative.  Negative for fever.  Respiratory: Negative.   Cardiovascular: Positive for leg swelling. Negative for chest pain and palpitations.  Skin: Positive for color change.       Objective:   Physical Exam Constitutional:      Comments: In pain. walks with a cane.   Cardiovascular:     Rate and Rhythm: Normal rate and regular rhythm.     Pulses: Normal pulses.     Heart sounds: Normal heart sounds.  Pulmonary:     Effort: Pulmonary effort is normal.     Breath sounds: Normal  breath sounds.  Musculoskeletal:     Comments: The entire right leg has 4+ edema from the waist down. No tenderness   Skin:    Comments: The right lower leg from the knee to the foot is red and warm   Neurological:     Mental Status: He is alert.           Assessment & Plan:  Cellulitis after a recent knee replacement. We will give him Oxycodone 10 mg for pain. He will stay on Clindamycin 300 mg QID. He will stay off the feet and try to elevate the right leg as much as possible. Recheck early next week.  [name] Penna, MD

## 2019-06-22 ENCOUNTER — Other Ambulatory Visit: Payer: Self-pay

## 2019-06-22 ENCOUNTER — Encounter: Payer: Self-pay | Admitting: Family Medicine

## 2019-06-22 ENCOUNTER — Telehealth (INDEPENDENT_AMBULATORY_CARE_PROVIDER_SITE_OTHER): Payer: Medicare Other | Admitting: Family Medicine

## 2019-06-22 VITALS — Temp 98.3°F | Ht 70.0 in | Wt 230.0 lb

## 2019-06-22 DIAGNOSIS — L03115 Cellulitis of right lower limb: Secondary | ICD-10-CM | POA: Diagnosis not present

## 2019-06-22 MED ORDER — DOXYCYCLINE HYCLATE 100 MG PO CAPS: 100.0000 mg | ORAL_CAPSULE | Freq: Two times a day (BID) | ORAL | 0 refills | Status: DC

## 2019-06-22 MED ORDER — CLINDAMYCIN HCL 300 MG PO CAPS: 300.0000 mg | ORAL_CAPSULE | Freq: Four times a day (QID) | ORAL | 0 refills | Status: DC

## 2019-06-22 NOTE — Progress Notes (Signed)
Virtual Visit via Telephone Note  I connected with the patient on 06/22/19 at  3:30 PM EST by telephone and verified that I am speaking with the correct person using two identifiers. We attempted to connect virtually but we had technical difficulties with the audio and video.     I discussed the limitations, risks, security and privacy concerns of performing an evaluation and management service by telephone and the availability of in person appointments. I also discussed with the patient that there may be a patient responsible charge related to this service. The patient expressed understanding and agreed to proceed.  Location patient: home Location provider: work or home office Participants present for the call: patient, provider Patient did not have a visit in the prior 7 days to address this/these issue(s).   History of Present Illness: Here to follow up on cellulitis in the right leg. We saw him on 06-19-19 for redness and pain and swelling in the right leg that developed after a total knee replacement surgery on 06-01-19. He has been taking Clindamycin for the past 6 days, and he has seen some mild improvement. The pain in the leg is better, and the swelling has decreased somewhat. The redness has faded to a pink color. He has never had a fever with this. He is moisturizing the leg with Cetaphil lotion several times a day.    Observations/Objective: Patient sounds cheerful and well on the phone. I do not appreciate any SOB. Speech and thought processing are grossly intact. Patient reported vitals:  Assessment and Plan: Right leg cellulitis. We will continue the Clindamycin for another 14 days, and we will add Doxycycline 100 mg bid to this. Recheck in 10 days. [name], MD   Follow Up Instructions:     [phone] 5-10 [phone] 11-20 [id number] 21-30 I did not refer this patient for an OV in the next 24 hours for this/these issue(s).  I discussed the assessment and treatment plan with the  patient. The patient was provided an opportunity to ask questions and all were answered. The patient agreed with the plan and demonstrated an understanding of the instructions.   The patient was advised to call back or seek an in-person evaluation if the symptoms worsen or if the condition fails to improve as anticipated.  I provided 12 minutes of non-face-to-face time during this encounter.   [name], MD

## 2019-06-29 ENCOUNTER — Telehealth: Payer: Self-pay | Admitting: *Deleted

## 2019-06-29 NOTE — Telephone Encounter (Signed)
Great improvement on rash with antibiotic.  Patient still has swelling. He would like to know if there is anything to help the swelling go down quicker. He did say that Dr. Sarajane Jews told him it may take a few weeks for this to improvement.  Patient is keeping feet elevated.

## 2019-06-29 NOTE — Telephone Encounter (Signed)
ATC, unable to leave a voicemail.  CRM created.

## 2019-06-29 NOTE — Telephone Encounter (Signed)
I think it will just take more time. Keep it elevated as much as possible. Do not wear a compression stocking as long as there is any infection present

## 2019-06-29 NOTE — Telephone Encounter (Signed)
Copied from [place] [id number]. Topic: General - Other >> Jun 29, 2019  7:35 AM Rayann Heman wrote: Reason for CRM: pt called and stated that he would like a cal from Dr Sarajane Jews Nurse. Pt states that he has questions about medications and if they are working correctly for cellulitis

## 2019-07-02 ENCOUNTER — Other Ambulatory Visit: Payer: Self-pay

## 2019-07-03 ENCOUNTER — Ambulatory Visit (INDEPENDENT_AMBULATORY_CARE_PROVIDER_SITE_OTHER): Payer: Medicare Other | Admitting: Family Medicine

## 2019-07-03 ENCOUNTER — Encounter: Payer: Self-pay | Admitting: Family Medicine

## 2019-07-03 VITALS — BP 120/70 | HR 103 | Temp 97.2°F | Wt 232.2 lb

## 2019-07-03 DIAGNOSIS — L03115 Cellulitis of right lower limb: Secondary | ICD-10-CM

## 2019-07-03 DIAGNOSIS — I890 Lymphedema, not elsewhere classified: Secondary | ICD-10-CM | POA: Insufficient documentation

## 2019-07-03 DIAGNOSIS — R600 Localized edema: Secondary | ICD-10-CM

## 2019-07-03 MED ORDER — DOXYCYCLINE HYCLATE 100 MG PO CAPS: 100.0000 mg | ORAL_CAPSULE | Freq: Two times a day (BID) | ORAL | 0 refills | Status: DC

## 2019-07-03 MED ORDER — CLINDAMYCIN HCL 300 MG PO CAPS: 300.0000 mg | ORAL_CAPSULE | Freq: Four times a day (QID) | ORAL | 0 refills | Status: DC

## 2019-07-03 NOTE — Progress Notes (Signed)
   Subjective:    Patient ID: [name] [name], male    DOB: 1951/03/06, 68 y.o.   MRN: [id number]  HPI Here to follow up cellulitis in the right leg. He is taking both Clindamycin and Doxycycline. He is slowly improving, the redness is fading the swelling is decreasing, and the pain has greatly lessened. He is able to walk now with his cane without much pain. He is scheduled to be evaluated by PT next week to start his post-surgical rehab.    Review of Systems  Constitutional: Negative.   Respiratory: Negative.   Cardiovascular: Positive for leg swelling.       Objective:   Physical Exam Constitutional:      General: He is not in acute distress. Cardiovascular:     Rate and Rhythm: Normal rate and regular rhythm.     Pulses: Normal pulses.     Heart sounds: Normal heart sounds.  Pulmonary:     Effort: Pulmonary effort is normal.     Breath sounds: Normal breath sounds.  Musculoskeletal:     Comments: 3+ edema in right leg, 2+ edema in the left leg. The right leg is a subtle pink and not red, not warm  Neurological:     Mental Status: He is alert.           Assessment & Plan:  The leg edema and the cellulitis is steadily improving. We will cover him with 2 more weeks of both antibiotics. Recheck in 2 weeks.  [name], MD

## 2019-07-13 ENCOUNTER — Telehealth: Payer: Self-pay | Admitting: Family Medicine

## 2019-07-13 NOTE — Telephone Encounter (Signed)
Medication: doxycycline (VIBRAMYCIN) 100 MG capsule [id number], clindamycin (CLEOCIN) 300 MG capsule [id number]- Apt scheduled 07/20/2019  Has the patient contacted their pharmacy? Yes  (Agent: If no, request that the patient contact the pharmacy for the refill.) (Agent: If yes, when and what did the pharmacy advise?)  Preferred Pharmacy (with phone number or street name): [facility] [phone] - [place], [address] AT [place]  Phone:  [phone] Fax:  [phone]     Agent: Please be advised that RX refills may take up to 3 business days. We ask that you follow-up with your pharmacy.

## 2019-07-13 NOTE — Telephone Encounter (Signed)
Requested medication (s) are due for refill today: no  Requested medication (s) are on the active medication list: yes  Last refill:  07/03/2019  Future visit scheduled: yes  Notes to clinic:    Medication not assigned to a protocol, review manually     Requested Prescriptions  Pending Prescriptions Disp Refills   doxycycline (VIBRAMYCIN) 100 MG capsule 28 capsule 0    Sig: Take 1 capsule (100 mg total) by mouth 2 (two) times daily.      Off-Protocol Failed - 07/13/2019  1:49 PM      Failed - Medication not assigned to a protocol, review manually.      Passed - Valid encounter within last 12 months    Recent Outpatient Visits           1 week ago Cellulitis of right leg   [place] [place], [place]   3 weeks ago Cellulitis of right leg   [facility]rap[facility], Ishmael Holter, MD   3 weeks ago Cellulitis of right leg   [facility], Ishmael Holter, MD   5 months ago Sciatica of left side   [facility], Ishmael Holter, MD   8 months ago Primary osteoarthritis involving multiple joints   [facility], Ishmael Holter, MD       Future Appointments             In 1 week [name], MD [facility] [place], [place]   In 2 weeks Tat, Eustace Quail, DO [facility] [place]              clindamycin (CLEOCIN) 300 MG capsule 56 capsule 0    Sig: Take 1 capsule (300 mg total) by mouth 4 (four) times daily.      Off-Protocol Failed - 07/13/2019  1:49 PM      Failed - Medication not assigned to a protocol, review manually.      Passed - Valid encounter within last 12 months    Recent Outpatient Visits           1 week ago Cellulitis of right leg   [facility] [place], MD   3 weeks ago Cellulitis of right leg   [facility] [place], [place]   3 weeks ago Cellulitis of right leg   [place] [facility]  [facility], Ishmael Holter, MD   5 months ago Sciatica of left side   [facility], Ishmael Holter, MD   8 months ago Primary osteoarthritis involving multiple joints   Therapist[facility] at [place], MD       Future Appointments             In 1 week [name], MD [facility] at [place], [place]   In 2 weeks Tat, Eustace Quail, [facility] [place]

## 2019-07-16 NOTE — Telephone Encounter (Signed)
Patient called to check the status of his med. Refill.  Wants to know why it was denied.

## 2019-07-16 NOTE — Telephone Encounter (Signed)
Okay for refill?  

## 2019-07-20 ENCOUNTER — Ambulatory Visit (INDEPENDENT_AMBULATORY_CARE_PROVIDER_SITE_OTHER): Payer: Medicare Other | Admitting: Family Medicine

## 2019-07-20 ENCOUNTER — Other Ambulatory Visit: Payer: Self-pay

## 2019-07-20 DIAGNOSIS — L03115 Cellulitis of right lower limb: Secondary | ICD-10-CM | POA: Diagnosis not present

## 2019-07-20 MED ORDER — DOXYCYCLINE HYCLATE 100 MG PO CAPS: 100.0000 mg | ORAL_CAPSULE | Freq: Two times a day (BID) | ORAL | 0 refills | Status: DC

## 2019-07-20 MED ORDER — CLINDAMYCIN HCL 300 MG PO CAPS: 300.0000 mg | ORAL_CAPSULE | Freq: Four times a day (QID) | ORAL | 0 refills | Status: DC

## 2019-07-21 ENCOUNTER — Encounter: Payer: Self-pay | Admitting: Family Medicine

## 2019-07-21 NOTE — Progress Notes (Signed)
Virtual Visit via Telephone Note  I connected with the patient on 07/21/19 at  3:00 PM EST by telephone and verified that I am speaking with the correct person using two identifiers.   I discussed the limitations, risks, security and privacy concerns of performing an evaluation and management service by telephone and the availability of in person appointments. I also discussed with the patient that there may be a patient responsible charge related to this service. The patient expressed understanding and agreed to proceed.  Location patient: home Location provider: work or home office Participants present for the call: patient, provider Patient did not have a visit in the prior 7 days to address this/these issue(s).   History of Present Illness: Here to follow up on cellulitis in the right leg. He continues to improve slowly but steadily. The swelling has gone down a little more, and the leg is not as red. His pain has greatly improved. He started on PT this week to rehab from his knee replacement. He thinks he needs to be on antibiotics a little longer.    Observations/Objective: Patient sounds cheerful and well on the phone. I do not appreciate any SOB. Speech and thought processing are grossly intact. Patient reported vitals:  Assessment and Plan: Cellulitis, we will continue the Clindamycin and Doxycycline for 14 more days. Recheck with an in person OV in 2 weeks.  [name], MD   Follow Up Instructions:     [phone] 11-20 [phone] 21-30 I did not refer this patient for an OV in the next 24 hours for this/these issue(s).  I discussed the assessment and treatment plan with the patient. The patient was provided an opportunity to ask questions and all were answered. The patient agreed with the plan and demonstrated an understanding of the instructions.   The patient was advised to call back or seek an in-person evaluation if the symptoms worsen or if the condition fails to  improve as anticipated.  I provided 13 minutes of non-face-to-face time during this encounter.   [name], MD

## 2019-07-23 DIAGNOSIS — M25561 Pain in right knee: Secondary | ICD-10-CM | POA: Diagnosis not present

## 2019-07-23 DIAGNOSIS — M25661 Stiffness of right knee, not elsewhere classified: Secondary | ICD-10-CM | POA: Diagnosis not present

## 2019-07-29 ENCOUNTER — Encounter: Payer: Self-pay | Admitting: Neurology

## 2019-07-30 ENCOUNTER — Telehealth: Payer: Self-pay | Admitting: Family Medicine

## 2019-07-30 MED ORDER — TRAMADOL HCL 50 MG PO TABS: 100.0000 mg | ORAL_TABLET | Freq: Four times a day (QID) | ORAL | 2 refills | Status: DC | PRN

## 2019-07-30 NOTE — Telephone Encounter (Signed)
Last filled 03/02/2019 Last OV 07/20/2019  Ok to fill?

## 2019-07-30 NOTE — Telephone Encounter (Signed)
Message Routed to PCP CMA 

## 2019-07-30 NOTE — Progress Notes (Signed)
Virtual Visit via Video Note The purpose of this virtual visit is to provide medical care while limiting exposure to the novel coronavirus.    Consent was obtained for video visit:  Yes.   Answered questions that patient had about telehealth interaction:  Yes.   I discussed the limitations, risks, security and privacy concerns of performing an evaluation and management service by telemedicine. I also discussed with the patient that there may be a patient responsible charge related to this service. The patient expressed understanding and agreed to proceed.  Pt location: Home Physician Location: office Name of referring provider:  [name], MD I connected with Christian Mate Kuehl at patients initiation/request on 07/31/2019 at 11:15 AM EST by video enabled telemedicine application and verified that I am speaking with the correct person using two identifiers. Pt MRN:  [id number] Pt DOB:  24-Jan-1951 Video Participants:  Jasani Helmly Kroeger;     History of Present Illness:  Patient seen today in follow-up for Parkinson's disease.  No compulsive behaviors.  No sleep attacks.  No falls.  No lightheadedness or near syncope.  Patient did have knee surgery since our last visit.  He originally had arthroscopy with Dr. Ronnie Derby, but developed complications and had significant pain in the knee.  He went for a second opinion with Dr. Oneal Grout.  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  Those records are reviewed.  He subsequently developed cellulitis.  He was on antibiotics for quite some time and still is.  He is now on tramadol for pain.  He is in physical therapy.  Current movement d/o meds:  Pramipexole ER, 1.5 mg daily   Current Outpatient Medications on File Prior to Visit  Medication Sig Dispense Refill  . atorvastatin (LIPITOR) 20 MG tablet Take 1 tablet (20 mg total) by mouth daily. (Patient taking differently: Take 20 mg by mouth at bedtime. ) 90 tablet 3  . clindamycin (CLEOCIN) 300 MG  capsule Take 1 capsule (300 mg total) by mouth 4 (four) times daily. 56 capsule 0  . doxycycline (VIBRAMYCIN) 100 MG capsule Take 1 capsule (100 mg total) by mouth 2 (two) times daily. 28 capsule 0  . famotidine (PEPCID) 20 MG tablet Take 1 tablet (20 mg total) by mouth at bedtime. 10 tablet 0  . finasteride (PROSCAR) 5 MG tablet Take 1 tablet (5 mg total) by mouth daily. (Patient taking differently: Take 5 mg by mouth at bedtime. ) 90 tablet 3  . hydrochlorothiazide (MICROZIDE) 12.5 MG capsule Take 1 capsule (12.5 mg total) by mouth daily. (Patient taking differently: Take 12.5 mg by mouth at bedtime. ) 90 capsule 3  . omeprazole (PRILOSEC) 40 MG capsule Take 1 capsule (40 mg total) by mouth daily before breakfast. 30 capsule 11  . polyethylene glycol (MIRALAX / GLYCOLAX) packet Take 8.5 g by mouth at bedtime.     . Pramipexole Dihydrochloride (MIRAPEX ER) 1.5 MG TB24 Take 1 tablet (1.5 mg total) by mouth daily. (Patient taking differently: Take 1.5 mg by mouth at bedtime. ) 90 tablet 1  . tamsulosin (FLOMAX) 0.4 MG CAPS capsule Take 1 capsule (0.4 mg total) by mouth daily. (Patient taking differently: Take 0.4 mg by mouth at bedtime. ) 90 capsule 3  . aspirin EC 81 MG tablet Take 1 tablet (81 mg total) by mouth 2 (two) times daily. (Patient not taking: Reported on 07/29/2019) 60 tablet 0  . ondansetron (ZOFRAN) 8 MG tablet Take 1 tablet (8 mg total) by mouth every  8 (eight) hours as needed for nausea or vomiting. (Patient not taking: Reported on 07/29/2019) 60 tablet 5  . traMADol (ULTRAM) 50 MG tablet Take 2 tablets (100 mg total) by mouth every 6 (six) hours as needed. 120 tablet 2   No current facility-administered medications on file prior to visit.     Observations/Objective:   Vitals:   07/29/19 1612  Weight: 220 lb (99.8 kg)  Height: 5\' 11"  (1.803 m)   GEN:  The patient appears stated age and is in NAD.  Neurological examination:  Orientation: The patient is alert and oriented  x3. Cranial nerves: There is good facial symmetry. There is no facial hypomimia.  The speech is fluent and clear. Soft palate rises symmetrically and there is no tongue deviation. Hearing is intact to conversational tone. Motor: Strength is at least antigravity x 4.   Shoulder shrug is equal and symmetric.  There is no pronator drift.  Movement examination: Tone: unable Abnormal movements: none seen Coordination:  There is no decremation with RAM's, with any form of RAMS, including alternating supination and pronation of the forearm, hand opening and closing, finger taps Gait and Station: The patient has no difficulty arising out of a deep-seated chair without the use of the hands. The patient's stride length is good.      Assessment and Plan:   1.  Parkinson's disease  -Continue pramipexole ER, 1.5 mg daily  -discussed covid vaccine 2.  Hip pain, left  -Status post left THA  3.  Status post total knee replacement, with subsequent cellulitis  -Patient is now recovering.  He is finishing antibiotics.  Still having some pain and on Ultram.  Is in physical therapy but thinks he is at the end of it  Follow Up Instructions:    -I discussed the assessment and treatment plan with the patient. The patient was provided an opportunity to ask questions and all were answered. The patient agreed with the plan and demonstrated an understanding of the instructions.   The patient was advised to call back or seek an in-person evaluation if the symptoms worsen or if the condition fails to improve as anticipated.     Alonza Bogus, DO

## 2019-07-30 NOTE — Telephone Encounter (Signed)
[name] [name], [name]  [name] [address]  Phone: [phone] Fax: [phone]   Pt [facility] she [facility].

## 2019-07-30 NOTE — Telephone Encounter (Signed)
Done

## 2019-07-30 NOTE — Telephone Encounter (Signed)
Pt needs a refill on Tramadol 50 mg  [facility].

## 2019-07-31 ENCOUNTER — Other Ambulatory Visit: Payer: Self-pay

## 2019-07-31 ENCOUNTER — Telehealth (INDEPENDENT_AMBULATORY_CARE_PROVIDER_SITE_OTHER): Payer: Medicare Other | Admitting: Neurology

## 2019-07-31 VITALS — Ht 71.0 in | Wt 220.0 lb

## 2019-07-31 DIAGNOSIS — G20 Parkinson's disease: Secondary | ICD-10-CM | POA: Diagnosis not present

## 2019-07-31 DIAGNOSIS — L03115 Cellulitis of right lower limb: Secondary | ICD-10-CM

## 2019-07-31 MED ORDER — PRAMIPEXOLE DIHYDROCHLORIDE ER 1.5 MG PO TB24: 1.5000 mg | ORAL_TABLET | Freq: Every day | ORAL | 1 refills | Status: DC

## 2019-07-31 MED ORDER — TRAMADOL HCL 50 MG PO TABS: 100.0000 mg | ORAL_TABLET | Freq: Four times a day (QID) | ORAL | 1 refills | Status: DC | PRN

## 2019-07-31 NOTE — Telephone Encounter (Signed)
Patient is aware 

## 2019-07-31 NOTE — Telephone Encounter (Signed)
[facility]

## 2019-08-03 ENCOUNTER — Ambulatory Visit (INDEPENDENT_AMBULATORY_CARE_PROVIDER_SITE_OTHER): Payer: Medicare Other | Admitting: Family Medicine

## 2019-08-03 ENCOUNTER — Encounter: Payer: Self-pay | Admitting: Family Medicine

## 2019-08-03 ENCOUNTER — Other Ambulatory Visit: Payer: Self-pay

## 2019-08-03 VITALS — BP 140/68 | HR 98 | Temp 97.8°F | Wt 219.8 lb

## 2019-08-03 DIAGNOSIS — L03115 Cellulitis of right lower limb: Secondary | ICD-10-CM

## 2019-08-03 DIAGNOSIS — M79604 Pain in right leg: Secondary | ICD-10-CM

## 2019-08-03 NOTE — Progress Notes (Signed)
   Subjective:    Patient ID: [name] [name], male    DOB: July 10, 1951, 69 y.o.   MRN: [id number]  HPI Here to follow up cellulitis in the right leg. He continues to have slow but steady decreases in the swelling of the thigh and calf. He has been on an extended course of Clindamycin and Doxycycline. He is doing PT 7 days a week. He works with the therapist twice a week and he does the same exercises at home the other days.    Review of Systems  Constitutional: Negative.   Respiratory: Negative.   Cardiovascular: Positive for leg swelling. Negative for chest pain and palpitations.       Objective:   Physical Exam Constitutional:      Appearance: Normal appearance.  Cardiovascular:     Rate and Rhythm: Normal rate and regular rhythm.     Pulses: Normal pulses.     Heart sounds: Normal heart sounds.  Pulmonary:     Effort: Pulmonary effort is normal.     Breath sounds: Normal breath sounds.  Musculoskeletal:     Comments: The right leg shows 2+ edema but is not tender. The skin is dull pink but not red. Not warm   Neurological:     Mental Status: He is alert.           Assessment & Plan:  The cellulitis has greatly improved. We will stop the antibiotics at this point. He will continue with PT. We will set up a venous doppler soon to rule out any thrombi.  [name], MD

## 2019-08-05 ENCOUNTER — Ambulatory Visit (HOSPITAL_COMMUNITY)
Admission: RE | Admit: 2019-08-05 | Discharge: 2019-08-05 | Disposition: A | Discharge status: Home / Self Care | Payer: Medicare Other | Source: Ambulatory Visit | Attending: Cardiology | Admitting: Cardiology

## 2019-08-05 ENCOUNTER — Other Ambulatory Visit: Payer: Self-pay

## 2019-08-05 DIAGNOSIS — M79604 Pain in right leg: Secondary | ICD-10-CM | POA: Insufficient documentation

## 2019-08-10 ENCOUNTER — Ambulatory Visit (INDEPENDENT_AMBULATORY_CARE_PROVIDER_SITE_OTHER): Payer: Medicare Other | Admitting: Family Medicine

## 2019-08-10 ENCOUNTER — Encounter: Payer: Self-pay | Admitting: Family Medicine

## 2019-08-10 ENCOUNTER — Other Ambulatory Visit: Payer: Self-pay

## 2019-08-10 VITALS — BP 120/80 | HR 105 | Temp 98.2°F | Ht 71.0 in | Wt 231.5 lb

## 2019-08-10 DIAGNOSIS — L03115 Cellulitis of right lower limb: Secondary | ICD-10-CM | POA: Diagnosis not present

## 2019-08-10 MED ORDER — CLINDAMYCIN HCL 300 MG PO CAPS: 300.0000 mg | ORAL_CAPSULE | Freq: Four times a day (QID) | ORAL | 0 refills | Status: DC

## 2019-08-10 MED ORDER — DOXYCYCLINE HYCLATE 100 MG PO CAPS: 100.0000 mg | ORAL_CAPSULE | Freq: Two times a day (BID) | ORAL | 0 refills | Status: DC

## 2019-08-10 NOTE — Progress Notes (Signed)
   Subjective:    Patient ID: JOHNATHAN MOUND, male    DOB: 09/21/1950, 69 y.o.   MRN: [id number]  HPI Here for another flare up of the cellulitis in the right leg. He had been taking Doxycycline and Clindamycin consistently for about 6 weeks when we last saw him a week ago. The infection and the swelling in the leg had been slowly but steadily improving. At that time we agreed to stop the antibiotics. I had intended for him to take it easy but he became much more active doing chores around his house. Several days ago he went up and down a ladder numerous times to fix something on the side of his house. That night the leg swelled up again, and now it is red and warm and mildly painful again. No fevers. Of note, he had a venous doppler last week which was normal. No DVT or venous obstructions were found.    Review of Systems  Constitutional: Negative.   Respiratory: Negative.   Cardiovascular: Positive for leg swelling. Negative for chest pain and palpitations.  Skin: Positive for color change.         Objective:   Physical Exam Constitutional:      Appearance: Normal appearance. He is not ill-appearing.  Cardiovascular:     Rate and Rhythm: Normal rate and regular rhythm.     Pulses: Normal pulses.     Heart sounds: Normal heart sounds.  Pulmonary:     Effort: Pulmonary effort is normal.     Breath sounds: Normal breath sounds.  Musculoskeletal:     Comments: The right leg again has 4+ edema, it is red and warm from the knee down. Not tender. There is a small amount of leakage of serous fluid from several areas on the right lower leg   Neurological:     Mental Status: He is alert.           Assessment & Plan:  The swelling and cellulitis that appeared after his knee replacement surgery has returned. We will resume the Doxycycline and Clindamycin, but we will also have him see Infectious Disease to see if they would have any other suggestions. I asked him to stay off the leg  and to keep it elevated most of the time.  [name], MD

## 2019-08-11 ENCOUNTER — Ambulatory Visit: Payer: Medicare Other | Admitting: Family Medicine

## 2019-08-17 ENCOUNTER — Telehealth: Payer: Self-pay | Admitting: Family Medicine

## 2019-08-17 MED ORDER — ESOMEPRAZOLE MAGNESIUM 40 MG PO CPDR: 40.0000 mg | DELAYED_RELEASE_CAPSULE | Freq: Every day | ORAL | 3 refills | Status: DC

## 2019-08-17 NOTE — Telephone Encounter (Signed)
Pt stated current acid reflux medication is not working and would like a different alternative. Pt can be reached at [phone] Pharmacy: [facility], [place]

## 2019-08-17 NOTE — Telephone Encounter (Signed)
I switched him to Nexium

## 2019-08-21 ENCOUNTER — Telehealth: Payer: Self-pay

## 2019-08-21 ENCOUNTER — Telehealth (INDEPENDENT_AMBULATORY_CARE_PROVIDER_SITE_OTHER): Payer: Medicare Other | Admitting: Family Medicine

## 2019-08-21 ENCOUNTER — Other Ambulatory Visit: Payer: Self-pay

## 2019-08-21 DIAGNOSIS — L03115 Cellulitis of right lower limb: Secondary | ICD-10-CM | POA: Diagnosis not present

## 2019-08-21 NOTE — Telephone Encounter (Signed)
COVID-19 Pre-Screening Questions:08/21/19  Do you currently have a fever (>100 F), chills or unexplained body aches?NO   Are you currently experiencing new cough, shortness of breath, sore throat, runny nose?NO .  Have you recently travelled outside the state of [place] in the last 14 days?NO .  Have you been in contact with someone that is currently pending confirmation of Covid19 testing or has been confirmed to have the [place] virus? NO   **If the patient answers NO to ALL questions -  advise the patient to please call the clinic before coming to the office should any symptoms develop.

## 2019-08-21 NOTE — Progress Notes (Signed)
Virtual Visit via Telephone Note  I connected with the patient on 08/21/19 at  8:00 AM EST by telephone and verified that I am speaking with the correct person using two identifiers.   I discussed the limitations, risks, security and privacy concerns of performing an evaluation and management service by telephone and the availability of in person appointments. I also discussed with the patient that there may be a patient responsible charge related to this service. The patient expressed understanding and agreed to proceed.  Location patient: home Location provider: work or home office Participants present for the call: patient, provider Patient did not have a visit in the prior 7 days to address this/these issue(s).   History of Present Illness: Here to follow up cellulitis in the right leg. Now that we are back on Clindamycin and Doxycycline he is improving again albeit slowly. The swelling is down and the leg is not as red. No other issues.    Observations/Objective: Patient sounds cheerful and well on the phone. I do not appreciate any SOB. Speech and thought processing are grossly intact. Patient reported vitals:  Assessment and Plan: Cellulitis. He will stay on the current regimen for now. He is scheduled to see Dr. Linus Salmons of Infectious Disease on Monday for an evaluation. [name], MD   Follow Up Instructions:     [phone] 5-10 [phone] 11-20 [phone] 21-30 I did not refer this patient for an OV in the next 24 hours for this/these issue(s).  I discussed the assessment and treatment plan with the patient. The patient was provided an opportunity to ask questions and all were answered. The patient agreed with the plan and demonstrated an understanding of the instructions.   The patient was advised to call back or seek an in-person evaluation if the symptoms worsen or if the condition fails to improve as anticipated.  I provided 12 minutes of non-face-to-face time during this  encounter.   [name], MD

## 2019-08-24 ENCOUNTER — Other Ambulatory Visit: Payer: Self-pay

## 2019-08-24 ENCOUNTER — Ambulatory Visit (INDEPENDENT_AMBULATORY_CARE_PROVIDER_SITE_OTHER): Payer: Medicare Other | Admitting: Internal Medicine

## 2019-08-24 ENCOUNTER — Encounter: Payer: Self-pay | Admitting: Internal Medicine

## 2019-08-24 DIAGNOSIS — I998 Other disorder of circulatory system: Secondary | ICD-10-CM | POA: Diagnosis not present

## 2019-08-24 NOTE — Progress Notes (Signed)
[facility]      Reason for Consult: ? cellulitis    Referring Physician: Dr. Sarajane Jews    Patient ID: [name] [name], male    DOB: 02-24-1951, 69 y.o.   MRN: [id number]  HPI:   He comes in for evaluation of his right leg.  In the last few months he has had 2 surgeries of his right knee, first arthroscopic surgery by Dr. Ronnie Derby but then went to Dr. Mayer Camel who determined he needed knee replacement and had that done on 06/01/19.  He states his physical recovery at that time was unremarkable but has had problems with swelling and erythema since his surgery.  He initially went to the ED at [facility] and was told he had cellulitis and given antbiotics.  His doppler there was negative for a DVT.  Since that time he has been on prolonged antibiotics with intermittent swelling and erythema of his leg.  He shows me pictures.  He has had no fever.  He had a minimally elevated WBC after surgery but otherwise has been wnl.  No significant pain.   His weight fluctuates with the swelling with up to 7 lb difference with increased swelling.  He has been less active and not keeping his leg elevated since the diagnsosis.     Past Medical History:  Diagnosis Date  . Arthritis   . Barrett's esophagus    sees Dr. Delfin Edis   . BPH with urinary obstruction   . Complication of anesthesia    Urinary retention. "fights it"- is what he was told. 1990's - vomitted and aspirated in surgery  . Diverticulosis   . Esophageal dysmotility   . GERD (gastroesophageal reflux disease)    not since [facility]  . History of hiatal hernia   . History of kidney stones    15 in 2015- last one was in Oct 2015- takes HCTZ to help prevent  . Hyperlipidemia   . Insomnia   . Lyme disease 2013  . Parkinson's disease [facility])    sees Dr. Wells Guiles Tat   . PONV (postoperative nausea and vomiting)   . [facility] 2012    Prior to Admission medications   Medication Sig  Start Date End Date Taking? Authorizing Provider  atorvastatin (LIPITOR) 20 MG tablet Take 1 tablet (20 mg total) by mouth daily. Patient taking differently: Take 20 mg by mouth at bedtime.  10/20/18  Yes [name], MD  clindamycin (CLEOCIN) 300 MG capsule Take 1 capsule (300 mg total) by mouth 4 (four) times daily. 08/10/19  Yes [name], MD  doxycycline (VIBRAMYCIN) 100 MG capsule Take 1 capsule (100 mg total) by mouth 2 (two) times daily. 08/10/19  Yes [name], MD  esomeprazole (NEXIUM) 40 MG capsule Take 1 capsule (40 mg total) by mouth daily. 08/17/19  Yes [name], MD  finasteride (PROSCAR) 5 MG tablet Take 1 tablet (5 mg total) by mouth daily. Patient taking differently: Take 5 mg by mouth at bedtime.  10/20/18  Yes [name], MD  hydrochlorothiazide (MICROZIDE) 12.5 MG capsule Take 1 capsule (12.5 mg total) by mouth daily. Patient taking differently: Take 12.5 mg by mouth at bedtime.  10/20/18  Yes [name], MD  ondansetron (ZOFRAN) 8 MG tablet Take 1 tablet (8 mg total) by mouth every 8 (eight) hours as needed for nausea or vomiting. 01/23/19  Yes [name], MD  polyethylene glycol [facility] /  GLYCOLAX) packet Take 8.5 g by mouth at bedtime.    Yes [provider]  Pramipexole Dihydrochloride (MIRAPEX ER) 1.5 MG TB24 Take 1 tablet (1.5 mg total) by mouth daily. 07/31/19  Yes Tat, Eustace Quail, DO  tamsulosin (FLOMAX) 0.4 MG CAPS capsule Take 1 capsule (0.4 mg total) by mouth daily. Patient taking differently: Take 0.4 mg by mouth at bedtime.  10/20/18  Yes [name], MD  traMADol (ULTRAM) 50 MG tablet Take 2 tablets (100 mg total) by mouth every 6 (six) hours as needed. 07/31/19  Yes [name], MD  aspirin EC 81 MG tablet Take 1 tablet (81 mg total) by mouth 2 (two) times daily. Patient not taking: Reported on 08/24/2019 06/01/19   Leighton Parody, PA-C    Allergies  Allergen Reactions  . Penicillins Rash and Other (See Comments)    Did it  involve swelling of the face/tongue/throat, SOB, or low BP? No Did it involve sudden or severe rash/hives, skin peeling, or any reaction on the inside of your mouth or nose? No Did you need to seek medical attention at a hospital or doctor's office? No When did it last happen?childhood allergy If all above answers are "NO", may proceed with cephalosporin use.      Social History   Tobacco Use  . Smoking status: Never Smoker  . Smokeless tobacco: Never Used  Substance Use Topics  . Alcohol use: No    Alcohol/week: 0.0 standard drinks  . Drug use: No    Family History  Problem Relation Age of Onset  . Arthritis Mother   . Cancer Mother   . Esophageal cancer Father   . Lung cancer Maternal Grandfather   . Esophageal cancer Paternal Grandmother   . Lung cancer Paternal Grandfather   . Colon cancer Sister        questionable  . Hypertension Brother        over [employer]  . Healthy Daughter   . Esophageal cancer Sister   . Healthy Daughter   . Esophageal cancer Paternal Aunt        x 2  . Esophageal cancer Cousin   . Rectal cancer Neg Hx   . Stomach cancer Neg Hx     Review of Systems  Constitutional: negative for fevers, chills and anorexia Gastrointestinal: negative for nausea and diarrhea All other systems reviewed and are negative    Constitutional: in no apparent distress  Vitals:   08/24/19 0844  BP: (!) 141/87  Pulse: (!) 105  Temp: 98.1 F (36.7 C)   EYES: anicteric ENMT: + mask Respiratory: no respiratory distress Musculoskeletal: right leg examined and significant edema, no difference in warmth compared to the left leg, palpable, blanching erythematous rash on lower leg.  Swelling up to knee.  Erythema extends half way up the shin.  No tenderness.   Skin: negatives: no other rash  Labs: Lab Results  Component Value Date   WBC 12.5 (H) 06/02/2019   HGB 13.5 06/02/2019   HCT 38.8 (L) 06/02/2019   MCV 90.0 06/02/2019   PLT 205 06/02/2019      Lab Results  Component Value Date   CREATININE 0.80 06/02/2019   BUN 15 06/02/2019   NA 136 06/02/2019   K 3.5 06/02/2019   CL 106 06/02/2019   CO2 23 06/02/2019    Lab Results  Component Value Date   ALT 22 01/23/2019   AST 20 01/23/2019   ALKPHOS 90 01/23/2019   BILITOT 3.5 (  H) 01/23/2019   INR 1.0 05/28/2019     Assessment: venous insufficiency.  I discussed my findings and in review of the pictures and does not look c/w cellulitis, at least at this time.  He has erythema but mainly associated with increased swelling.  Weeping and ulcers now present more c/w the edema.  The palpable lesions could look similar to erysipelas but he has had no systemic symptoms at all to suggest that.  I would recommend treatment to reduce the swelling including walking, calf stretches and elevation when sitting or lying.  Could also consider compression stockings.   If no improvement, can consider vascular surgery evaluation.    Plan: 1) increase walking 2) elevation of leg when not walking 3) reduced standing when not walking 4) stop antibiotics Call if he develops more symptoms related to infection Thanks for consultation

## 2019-09-17 DIAGNOSIS — M7989 Other specified soft tissue disorders: Secondary | ICD-10-CM | POA: Diagnosis not present

## 2019-09-29 DIAGNOSIS — M25661 Stiffness of right knee, not elsewhere classified: Secondary | ICD-10-CM | POA: Diagnosis not present

## 2019-09-29 DIAGNOSIS — M25561 Pain in right knee: Secondary | ICD-10-CM | POA: Diagnosis not present

## 2019-10-06 DIAGNOSIS — M25561 Pain in right knee: Secondary | ICD-10-CM | POA: Diagnosis not present

## 2019-10-06 DIAGNOSIS — M25661 Stiffness of right knee, not elsewhere classified: Secondary | ICD-10-CM | POA: Diagnosis not present

## 2019-10-08 DIAGNOSIS — M25561 Pain in right knee: Secondary | ICD-10-CM | POA: Diagnosis not present

## 2019-10-09 ENCOUNTER — Telehealth: Payer: Self-pay | Admitting: Family Medicine

## 2019-10-09 NOTE — Telephone Encounter (Signed)
pt need an Rx for hydrochlorothiazide (MICROZIDE) 12.5 MG capsule sent  [place], [place]  Phone:  [phone] Fax:  [phone]

## 2019-10-10 MED ORDER — HYDROCHLOROTHIAZIDE 12.5 MG PO CAPS: 12.5000 mg | ORAL_CAPSULE | Freq: Every day | ORAL | 3 refills | Status: DC

## 2019-10-10 NOTE — Telephone Encounter (Signed)
Refill has been sent in.  

## 2019-10-19 IMAGING — RF DG HIP (WITH PELVIS) OPERATIVE*L*
1 series · 2 of 2 positions shown · non-contrast
Comparison: None.

CLINICAL DATA: Status post left anterior approach hip prosthesis
placement.

EXAM:
DG C-ARM 61-120 MIN; OPERATIVE LEFT HIP WITH PELVIS

[Series 1: run · 2 of 2 slices shown]
[im 1/2]
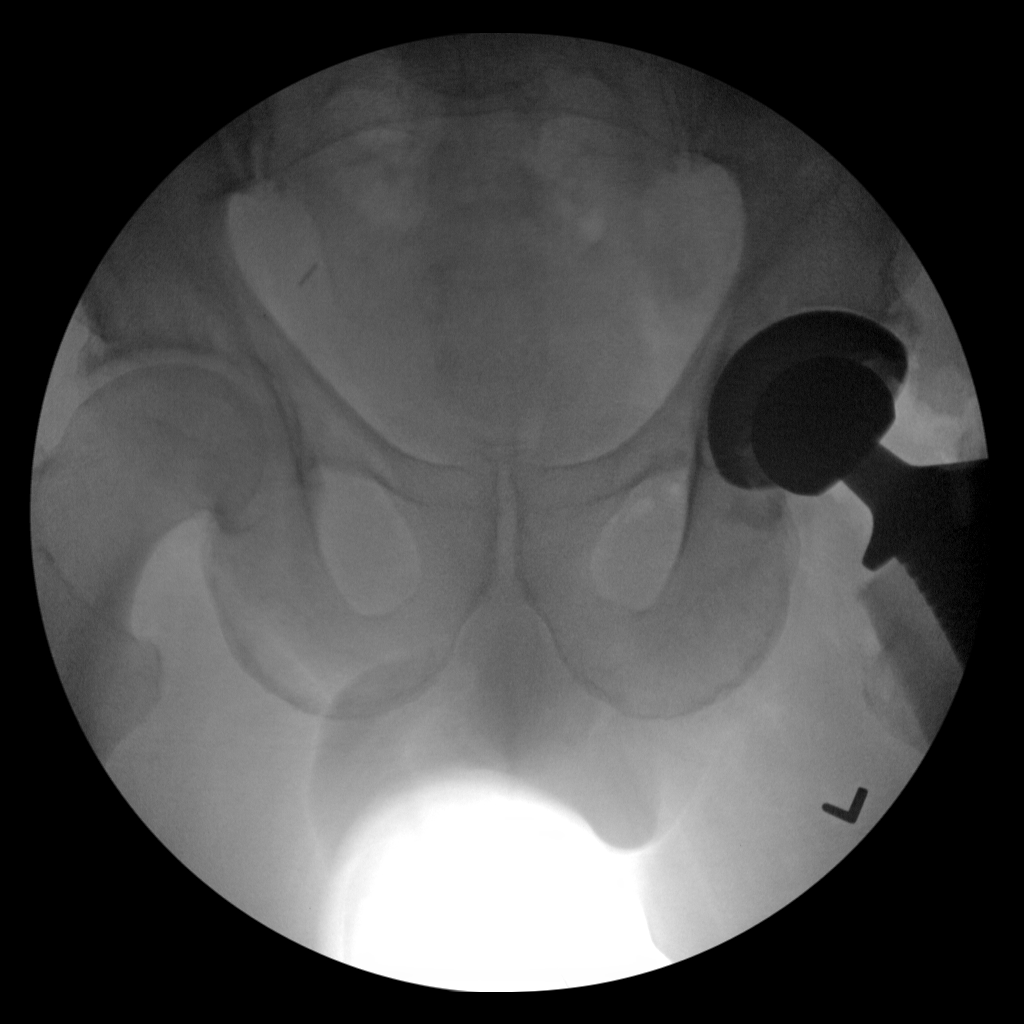
[im 2/2]
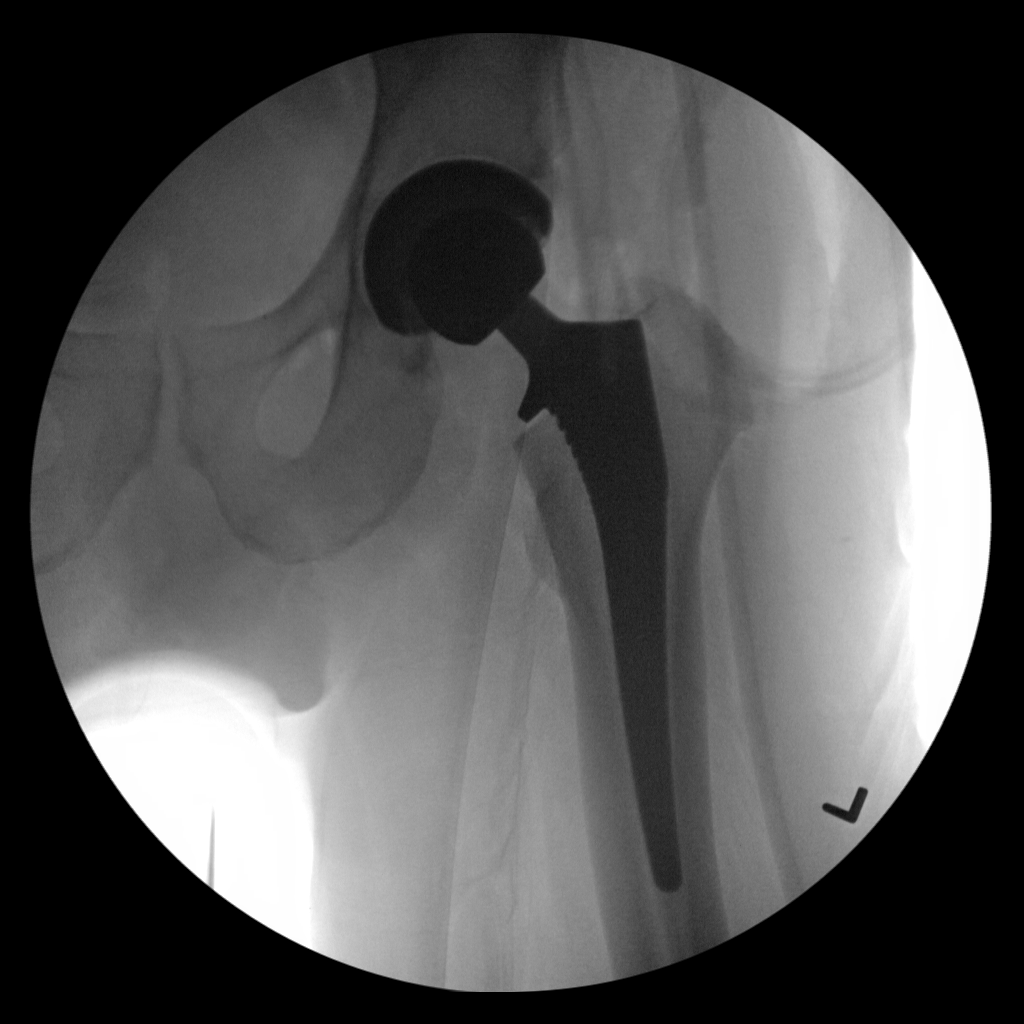

[2 of 2 positions shown; findings below may reference images not displayed]

FINDINGS: Two fluoro spot images are submitted. Reported fluoro time is 18
seconds. There is a prosthetic left hip joint in place. Radiographic
positioning of the prosthetic components is good. The interface with
the native bone appears normal.
IMPRESSION: No immediate postprocedure complication following anterior approach
left total hip joint prosthesis placement.

## 2019-11-05 ENCOUNTER — Telehealth: Payer: Self-pay | Admitting: Family Medicine

## 2019-11-05 MED ORDER — TAMSULOSIN HCL 0.4 MG PO CAPS: 0.4000 mg | ORAL_CAPSULE | Freq: Every day | ORAL | 0 refills | Status: DC

## 2019-11-05 NOTE — Telephone Encounter (Signed)
tamsulosin (FLOMAX) 0.4 MG CAPS capsule   [facility] [place], [place] Phone:  [phone]  Fax:  [phone]

## 2019-11-16 ENCOUNTER — Telehealth: Payer: Self-pay | Admitting: Family Medicine

## 2019-11-16 MED ORDER — ATORVASTATIN CALCIUM 20 MG PO TABS: 20.0000 mg | ORAL_TABLET | Freq: Every day | ORAL | 3 refills | Status: DC

## 2019-11-16 MED ORDER — FINASTERIDE 5 MG PO TABS: 5.0000 mg | ORAL_TABLET | Freq: Every day | ORAL | 3 refills | Status: DC

## 2019-11-16 NOTE — Addendum Note (Signed)
Addended by: Rebecca Eaton on: 11/16/2019 03:14 PM   Modules accepted: Orders

## 2019-11-16 NOTE — Telephone Encounter (Signed)
Medication Refill: Atorvastatin Finasteride   Pharmacy: [facility]  FAX:  [phone]

## 2019-11-16 NOTE — Telephone Encounter (Signed)
Rx sent in. Left a detailed message on verified voice mail.   

## 2019-12-08 DIAGNOSIS — Z96651 Presence of right artificial knee joint: Secondary | ICD-10-CM | POA: Diagnosis not present

## 2019-12-08 DIAGNOSIS — M25561 Pain in right knee: Secondary | ICD-10-CM | POA: Diagnosis not present

## 2019-12-25 NOTE — Progress Notes (Signed)
Assessment/Plan:   1.  Parkinsons Disease  -discussed multiple options including don't change anything, increase pramipexole and adding levodopa.  He wants to hold on changing anything until next visit.  -Continue pramipexole ER, 1.5 mg daily.  2.  Venous insufficiency  -following with Dr. Sarajane Jews  3.  Follow up is anticipated in the next 4-6 months, sooner should new neurologic issues arise.   Subjective:   [name] [name] was seen today in follow up for Parkinsons disease.  My previous records were reviewed prior to todays visit as well as outside records available to me. Pt continues to deal with his leg.  Saw ID and no longer has infection - has vascular insufficiency.  Tremor in the R hand been more pronounced but he attributes it to pain and lack of sleep.  Current prescribed movement disorder medications: Pramipexole ER, 1.5 mg daily.   PREVIOUS MEDICATIONS: Mirapex  ALLERGIES:   Allergies  Allergen Reactions  . Penicillins Rash and Other (See Comments)    Did it involve swelling of the face/tongue/throat, SOB, or low BP? No Did it involve sudden or severe rash/hives, skin peeling, or any reaction on the inside of your mouth or nose? No Did you need to seek medical attention at a hospital or doctor's office? No When did it last happen?childhood allergy If all above answers are "NO", may proceed with cephalosporin use.      CURRENT MEDICATIONS:  Outpatient Encounter Medications as of 12/30/2019  Medication Sig  . atorvastatin (LIPITOR) 20 MG tablet Take 1 tablet (20 mg total) by mouth daily.  Marland Kitchen esomeprazole (NEXIUM) 40 MG capsule Take 1 capsule (40 mg total) by mouth daily.  . finasteride (PROSCAR) 5 MG tablet Take 1 tablet (5 mg total) by mouth daily.  . hydrochlorothiazide (MICROZIDE) 12.5 MG capsule Take 1 capsule (12.5 mg total) by mouth daily.  . ondansetron (ZOFRAN) 8 MG tablet Take 1 tablet (8 mg total) by mouth every 8 (eight) hours as needed for  nausea or vomiting.  . polyethylene glycol (MIRALAX / GLYCOLAX) packet Take 8.5 g by mouth at bedtime.   . Pramipexole Dihydrochloride (MIRAPEX ER) 1.5 MG TB24 Take 1 tablet (1.5 mg total) by mouth daily.  . tamsulosin (FLOMAX) 0.4 MG CAPS capsule Take 1 capsule (0.4 mg total) by mouth daily.  . traMADol (ULTRAM) 50 MG tablet Take 2 tablets (100 mg total) by mouth every 6 (six) hours as needed.  . [DISCONTINUED] aspirin EC 81 MG tablet Take 1 tablet (81 mg total) by mouth 2 (two) times daily. (Patient not taking: Reported on 08/24/2019)   No facility-administered encounter medications on file as of 12/30/2019.    Objective:   PHYSICAL EXAMINATION:    VITALS:   Vitals:   12/30/19 0929  BP: (!) 145/88  Pulse: 94  SpO2: 97%  Weight: 227 lb (103 kg)  Height: 5\' 11"  (1.803 m)   Wt Readings from Last 3 Encounters:  12/30/19 227 lb (103 kg)  08/24/19 229 lb (103.9 kg)  08/10/19 231 lb 8 oz (105 kg)     GEN:  The patient appears stated age and is in NAD. HEENT:  Normocephalic, atraumatic.  The mucous membranes are moist. The superficial temporal arteries are without ropiness or tenderness. CV:  RRR Lungs:  CTAB Neck/HEME:  There are no carotid bruits bilaterally. Skin:  There is erythema in the RLE  Neurological examination:  Orientation: The patient is alert and oriented x3. Cranial nerves: There is good facial symmetry  with min facial hypomimia. The speech is fluent and clear. Soft palate rises symmetrically and there is no tongue deviation. Hearing is intact to conversational tone. Sensation: Sensation is intact to light touch throughout Motor: Strength is at least antigravity x4.  Movement examination: Tone: There is normal tone in the ue/le Abnormal movements: there is intermittent RUE rest tremor Coordination:  There is mild decremation with RAM's, with any form of RAMS, including alternating supination and pronation of the forearm, hand opening and closing, finger taps,  heel taps and toe taps, R>L Gait and Station: The patient has no difficulty arising out of a deep-seated chair without the use of the hands. The patient's stride length is good but he is forward flexed at the waist (states d/t back pain).    I have reviewed and interpreted the following labs independently    Chemistry      Component Value Date/Time   NA 136 06/02/2019 0313   K 3.5 06/02/2019 0313   CL 106 06/02/2019 0313   CO2 23 06/02/2019 0313   BUN 15 06/02/2019 0313   CREATININE 0.80 06/02/2019 0313      Component Value Date/Time   CALCIUM 8.4 (L) 06/02/2019 0313   ALKPHOS 90 01/23/2019 1118   AST 20 01/23/2019 1118   ALT 22 01/23/2019 1118   BILITOT 3.5 (H) 01/23/2019 1118       Lab Results  Component Value Date   WBC 12.5 (H) 06/02/2019   HGB 13.5 06/02/2019   HCT 38.8 (L) 06/02/2019   MCV 90.0 06/02/2019   PLT 205 06/02/2019    Lab Results  Component Value Date   TSH 2.03 01/23/2019     Total time spent on today's visit was 30 minutes, including both face-to-face time and nonface-to-face time.  Time included that spent on review of records (prior notes available to me/labs/imaging if pertinent), discussing treatment and goals, answering patient's questions and coordinating care.  Cc:  [name], MD

## 2019-12-30 ENCOUNTER — Ambulatory Visit (INDEPENDENT_AMBULATORY_CARE_PROVIDER_SITE_OTHER): Payer: Medicare Other | Admitting: Neurology

## 2019-12-30 ENCOUNTER — Encounter: Payer: Self-pay | Admitting: Neurology

## 2019-12-30 ENCOUNTER — Ambulatory Visit (INDEPENDENT_AMBULATORY_CARE_PROVIDER_SITE_OTHER): Payer: Medicare Other | Admitting: Family Medicine

## 2019-12-30 ENCOUNTER — Encounter: Payer: Self-pay | Admitting: Family Medicine

## 2019-12-30 ENCOUNTER — Other Ambulatory Visit: Payer: Self-pay

## 2019-12-30 VITALS — BP 124/62 | HR 94 | Temp 98.0°F | Wt 220.8 lb

## 2019-12-30 VITALS — BP 145/88 | HR 94 | Ht 71.0 in | Wt 227.0 lb

## 2019-12-30 DIAGNOSIS — I998 Other disorder of circulatory system: Secondary | ICD-10-CM

## 2019-12-30 DIAGNOSIS — G20 Parkinson's disease: Secondary | ICD-10-CM | POA: Diagnosis not present

## 2019-12-30 DIAGNOSIS — R600 Localized edema: Secondary | ICD-10-CM

## 2019-12-30 NOTE — Progress Notes (Signed)
   Subjective:    Patient ID: [name] [name], male    DOB: 12-27-1950, 69 y.o.   MRN: [id number]  HPI Here to follow up leg swelling. He saw Dr. Scharlene Gloss of Infectious Disease in February, and he said there was no cellulitis. He basically has venous insufficiency. He stopped the antibiotics, and he suggested walking and elevating the legs. Now the leg is about the same, it stays swollen and red and mildly painful. He does not tolerate compression stockings.   Review of Systems  Constitutional: Negative.   Respiratory: Negative.   Cardiovascular: Positive for leg swelling. Negative for chest pain and palpitations.       Objective:   Physical Exam Constitutional:      Appearance: Normal appearance.  Cardiovascular:     Rate and Rhythm: Normal rate and regular rhythm.     Pulses: Normal pulses.     Heart sounds: Normal heart sounds.  Pulmonary:     Effort: Pulmonary effort is normal.     Breath sounds: Normal breath sounds.  Musculoskeletal:     Comments: The left lower leg has 1+ edema and the lower right leg has 3+ edema. The skin is red but not warm  Neurological:     Mental Status: He is alert.           Assessment & Plan:  Venous insufficiency, we will refer to Vascular Surgery. [name], MD

## 2019-12-30 NOTE — Patient Instructions (Signed)
We will hold on change until next visit.  If you decide before then to add something or increase pramipexole, you can call me.  The physicians and staff at [facility] are committed to providing excellent care. You may receive a survey requesting feedback about your experience at our office. We strive to receive "very good" responses to the survey questions. If you feel that your experience would prevent you from giving the office a "very good " response, please contact our office to try to remedy the situation. We may be reached at [phone]. Thank you for taking the time out of your busy day to complete the survey.

## 2020-01-21 ENCOUNTER — Other Ambulatory Visit: Payer: Self-pay | Admitting: *Deleted

## 2020-01-21 DIAGNOSIS — I998 Other disorder of circulatory system: Secondary | ICD-10-CM

## 2020-01-27 ENCOUNTER — Ambulatory Visit (INDEPENDENT_AMBULATORY_CARE_PROVIDER_SITE_OTHER): Payer: Medicare Other | Admitting: Vascular Surgery

## 2020-01-27 ENCOUNTER — Ambulatory Visit (HOSPITAL_COMMUNITY)
Admission: RE | Admit: 2020-01-27 | Discharge: 2020-01-27 | Disposition: A | Discharge status: Home / Self Care | Payer: Medicare Other | Source: Ambulatory Visit | Attending: Surgery | Admitting: Surgery

## 2020-01-27 ENCOUNTER — Other Ambulatory Visit: Payer: Self-pay

## 2020-01-27 ENCOUNTER — Encounter: Payer: Self-pay | Admitting: Vascular Surgery

## 2020-01-27 VITALS — BP 137/88 | HR 84 | Temp 97.6°F | Resp 18 | Ht 71.0 in | Wt 219.0 lb

## 2020-01-27 DIAGNOSIS — I998 Other disorder of circulatory system: Secondary | ICD-10-CM | POA: Diagnosis present

## 2020-01-27 DIAGNOSIS — I8393 Asymptomatic varicose veins of bilateral lower extremities: Secondary | ICD-10-CM

## 2020-01-27 DIAGNOSIS — I8001 Phlebitis and thrombophlebitis of superficial vessels of right lower extremity: Secondary | ICD-10-CM

## 2020-01-27 NOTE — Progress Notes (Signed)
Referring Physician: Dr. Sarajane Jews  Patient name: [name] [name] MRN: [id number] DOB: [birth year] Sex: male  REASON FOR CONSULT: Right leg swelling and pain  HPI: [name] [name] is a 69 y.o. male, who underwent right total knee replacement November 2020.  A few weeks after this he developed a cellulitis in his right leg.  This was treated with several courses of antibiotics.  He did have a DVT ultrasound in January 2021 which showed no evidence of DVT.  He has no prior episode of venous thrombosis.  He does not describe claudication symptoms.  He states that the right leg is 80% better but has persistent redness in the calf.  He still has some pain in the right leg.  He has never had any prior episodes.  He has never worn compression stockings.  Other medical problems include multijoint arthritis.  This has been stable.  Past Medical History:  Diagnosis Date  . Arthritis   . Barrett's esophagus    sees Dr. Delfin Edis   . BPH with urinary obstruction   . Complication of anesthesia    Urinary retention. "fights it"- is what he was told. 1990's - vomitted and aspirated in surgery  . Diverticulosis   . Esophageal dysmotility   . GERD (gastroesophageal reflux disease)    not since [facility] Fundopliation  . History of hiatal hernia   . History of kidney stones    15 in 2015- last one was in Oct 2015- takes HCTZ to help prevent  . Hyperlipidemia   . Insomnia   . Lyme disease 2013  . Parkinson's disease [facility])    sees Dr. Wells Guiles Tat   . PONV (postoperative nausea and vomiting)   . RMS[facility] spotted fever) 2012   Past Surgical History:  Procedure Laterality Date  . CHOLECYSTECTOMY    . COLONOSCOPY  06-24-13   per Dr. Olevia Perches, no polyps, diveticulosis only, repeat in 10 yrs   . ESOPHAGOGASTRODUODENOSCOPY  06-24-13   per Dr. Olevia Perches, no signs of Barretts, repeat in 5 yrs   . HERNIA REPAIR Right    right inguinal  . LASIK     bilateral  . lipomas removed     x10/ on  arms and legs  . NISSEN FUNDOPLICATION  6-78-93   laparoscopic per Dr. Johnathan Hausen  . ORCHIOPEXY     right side at age 69   . TONSILLECTOMY AND ADENOIDECTOMY    . TOTAL HIP ARTHROPLASTY Left 05/26/2018   Procedure: TOTAL HIP ARTHROPLASTY ANTERIOR APPROACH;  Surgeon: Dorna Leitz, MD;  Location: [place];  Service: Orthopedics;  Laterality: Left;  . TOTAL KNEE ARTHROPLASTY Right 06/01/2019   Procedure: RIGHT TOTAL KNEE ARTHROPLASTY;  Surgeon: Frederik Pear, MD;  Location: [facility];  Service: Orthopedics;  Laterality: Right;  . TOTAL SHOULDER ARTHROPLASTY Right 06/23/2015   Procedure: RIGHT TOTAL SHOULDER ARTHROPLASTY;  Surgeon: Justice Britain, MD;  Location: [place];  Service: Orthopedics;  Laterality: Right;    Family History  Problem Relation Age of Onset  . Arthritis Mother   . Cancer Mother   . Esophageal cancer Father   . Lung cancer Maternal Grandfather   . Esophageal cancer Paternal Grandmother   . Lung cancer Paternal Grandfather   . Colon cancer Sister        questionable  . Hypertension Brother        over [employer]  . Healthy Daughter   . Esophageal cancer Sister   . Healthy Daughter   . Esophageal  cancer Paternal Aunt        x 2  . Esophageal cancer Cousin   . Rectal cancer Neg Hx   . Stomach cancer Neg Hx     SOCIAL HISTORY: Social History   Socioeconomic History  . Marital status: Married    Spouse name: Not on file  . Number of children: 2  . Years of education: Not on file  . Highest education level: Master's degree (e.g., MA, MS, MEng, MEd, MSW, MBA)  Occupational History  . Not on file  Tobacco Use  . Smoking status: Never Smoker  . Smokeless tobacco: Never Used  Vaping Use  . Vaping Use: Never used  Substance and Sexual Activity  . Alcohol use: No    Alcohol/week: 0.0 standard drinks  . Drug use: No  . Sexual activity: Not on file  Other Topics Concern  . Not on file  Social History Narrative  . Not on file   Social Determinants of Health    Financial Resource Strain:   . Difficulty of Paying Living Expenses:   Food Insecurity:   . Worried About Charity fundraiser in the Last Year:   . Arboriculturist in the Last Year:   Transportation Needs:   . Film/video editor (Medical):   Marland Kitchen Lack of Transportation (Non-Medical):   Physical Activity:   . Days of Exercise per Week:   . Minutes of Exercise per Session:   Stress:   . Feeling of Stress :   Social Connections:   . Frequency of Communication with Friends and Family:   . Frequency of Social Gatherings with Friends and Family:   . Attends Religious Services:   . Active Member of Clubs or Organizations:   . Attends Archivist Meetings:   Marland Kitchen Marital Status:   Intimate Partner Violence:   . Fear of Current or Ex-Partner:   . Emotionally Abused:   Marland Kitchen Physically Abused:   . Sexually Abused:     Allergies  Allergen Reactions  . Penicillins Rash and Other (See Comments)    Did it involve swelling of the face/tongue/throat, SOB, or low BP? No Did it involve sudden or severe rash/hives, skin peeling, or any reaction on the inside of your mouth or nose? No Did you need to seek medical attention at a hospital or doctor's office? No When did it last happen?childhood allergy If all above answers are "NO", may proceed with cephalosporin use.      Current Outpatient Medications  Medication Sig Dispense Refill  . atorvastatin (LIPITOR) 20 MG tablet Take 1 tablet (20 mg total) by mouth daily. 90 tablet 3  . esomeprazole (NEXIUM) 40 MG capsule Take 1 capsule (40 mg total) by mouth daily. 90 capsule 3  . finasteride (PROSCAR) 5 MG tablet Take 1 tablet (5 mg total) by mouth daily. 90 tablet 3  . hydrochlorothiazide (MICROZIDE) 12.5 MG capsule Take 1 capsule (12.5 mg total) by mouth daily. 90 capsule 3  . ondansetron (ZOFRAN) 8 MG tablet Take 1 tablet (8 mg total) by mouth every 8 (eight) hours as needed for nausea or vomiting. 60 tablet 5  . polyethylene  glycol (MIRALAX / GLYCOLAX) packet Take 8.5 g by mouth at bedtime.     . Pramipexole Dihydrochloride (MIRAPEX ER) 1.5 MG TB24 Take 1 tablet (1.5 mg total) by mouth daily. 90 tablet 1  . tamsulosin (FLOMAX) 0.4 MG CAPS capsule Take 1 capsule (0.4 mg total) by mouth daily. 90 capsule 0  .  traMADol (ULTRAM) 50 MG tablet Take 2 tablets (100 mg total) by mouth every 6 (six) hours as needed. 360 tablet 1   No current facility-administered medications for this visit.    ROS:   General:  No weight loss, Fever, chills  HEENT: No recent headaches, no nasal bleeding, no visual changes, no sore throat  Neurologic: No dizziness, blackouts, seizures. No recent symptoms of stroke or mini- stroke. No recent episodes of slurred speech, or temporary blindness.  Cardiac: No recent episodes of chest pain/pressure, no shortness of breath at rest.  No shortness of breath with exertion.  Denies history of atrial fibrillation or irregular heartbeat  Vascular: No history of rest pain in feet.  No history of claudication.  No history of non-healing ulcer, No history of DVT   Pulmonary: No home oxygen, no productive cough, no hemoptysis,  No asthma or wheezing  Musculoskeletal:  [X]  Arthritis, [ ]  Low back pain,  [X]  Joint pain  Hematologic:No history of hypercoagulable state.  No history of easy bleeding.  No history of anemia  Gastrointestinal: No hematochezia or melena,  No gastroesophageal reflux, no trouble swallowing  Urinary: [ ]  chronic Kidney disease, [ ]  on HD - [ ]  MWF or [ ]  TTHS, [ ]  Burning with urination, [ ]  Frequent urination, [ ]  Difficulty urinating;   Skin: No rashes  Psychological: No history of anxiety,  No history of depression   Physical Examination  Vitals:   01/27/20 1455  BP: 137/88  Pulse: 84  Resp: 18  Temp: 97.6 F (36.4 C)  TempSrc: Temporal  SpO2: 99%  Weight: 219 lb (99.3 kg)  Height: 5\' 11"  (1.803 m)    Body mass index is 30.54 kg/m.  General:  Alert and  oriented, no acute distress HEENT: Normal Neck: No JVD Cardiac: Regular Rate and Rhythm Skin: No rash, diffuse blanching erythema circumferentially from just below the knee on the right leg down to the ankle no palpable cord obvious varicosities Extremity Pulses:  2+dorsalis pedis, posterior tibial pulses bilaterally Musculoskeletal: No deformity diffuse 1-2+ right leg edema extending from the knee down to the foot.  Trace edema pretibial region left leg  Neurologic: Upper and lower extremity motor 5/5 and symmetric  DATA: Patient had a venous duplex exam for reflux today.  This showed no evidence of DVT.  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  There was deep vein reflux and reflux at the right greater saphenous femoral junction.   ASSESSMENT: Resolving cellulitis right lower extremity most likely secondary to superficial thrombophlebitis of his right greater saphenous vein.  He has underlying chronic deep vein reflux and probably occlusion of his saphenous vein optimized his superficial venous system and exacerbated leg swelling symptoms causing his cellulitis.   PLAN: Patient was fitted for lower extremity compression stockings today.  He will wear these as often as possible during the daytime to prevent recurrence of cellulitis and reduce edema in both lower extremities.  He will follow up with [place] in 4 to 6 weeks for repeat duplex scan for reflux.  If the vein remains thrombosed most likely no role for laser ablation.  If this recanalizes we could consider this to prevent further episodes from happening down the road.  I do not believe he warrants anticoagulation at this time as the thrombus in his greater saphenous vein appears chronic rather than acute   Ruta Hinds, MD [facility] [place]: [phone] Pager: [phone]

## 2020-01-28 ENCOUNTER — Other Ambulatory Visit: Payer: Self-pay | Admitting: *Deleted

## 2020-01-28 DIAGNOSIS — I998 Other disorder of circulatory system: Secondary | ICD-10-CM

## 2020-01-28 DIAGNOSIS — I8001 Phlebitis and thrombophlebitis of superficial vessels of right lower extremity: Secondary | ICD-10-CM

## 2020-02-01 ENCOUNTER — Telehealth: Payer: Self-pay | Admitting: Family Medicine

## 2020-02-01 MED ORDER — TAMSULOSIN HCL 0.4 MG PO CAPS: 0.4000 mg | ORAL_CAPSULE | Freq: Every day | ORAL | 0 refills | Status: DC

## 2020-02-01 NOTE — Telephone Encounter (Signed)
tamsulosin (FLOMAX) 0.4 MG CAPS capsule    [facility] [phone] - [place], [place] - [address] AT [address] Phone:  [phone]  Fax:  [phone]

## 2020-02-01 NOTE — Telephone Encounter (Signed)
Rx sent in. Spoke with the patients wife, she is aware that his medication has been sent in and will relay the message.

## 2020-02-05 DIAGNOSIS — M7989 Other specified soft tissue disorders: Secondary | ICD-10-CM

## 2020-02-05 DIAGNOSIS — M545 Low back pain: Secondary | ICD-10-CM | POA: Diagnosis not present

## 2020-02-10 ENCOUNTER — Other Ambulatory Visit: Payer: Self-pay

## 2020-02-10 ENCOUNTER — Telehealth: Payer: Self-pay | Admitting: Neurology

## 2020-02-10 MED ORDER — PRAMIPEXOLE DIHYDROCHLORIDE ER 1.5 MG PO TB24: 1.5000 mg | ORAL_TABLET | Freq: Every day | ORAL | 0 refills | Status: DC

## 2020-02-10 NOTE — Telephone Encounter (Signed)
Patient called in needing a 90 day prescription of his Mirapex sent to [facility].

## 2020-02-10 NOTE — Telephone Encounter (Signed)
[facility]

## 2020-02-19 DIAGNOSIS — M6281 Muscle weakness (generalized): Secondary | ICD-10-CM | POA: Diagnosis not present

## 2020-02-19 DIAGNOSIS — M545 Low back pain: Secondary | ICD-10-CM | POA: Diagnosis not present

## 2020-02-23 DIAGNOSIS — M545 Low back pain: Secondary | ICD-10-CM | POA: Diagnosis not present

## 2020-02-23 DIAGNOSIS — M6281 Muscle weakness (generalized): Secondary | ICD-10-CM | POA: Diagnosis not present

## 2020-02-24 ENCOUNTER — Other Ambulatory Visit: Payer: Self-pay

## 2020-02-24 ENCOUNTER — Encounter: Payer: Self-pay | Admitting: Family Medicine

## 2020-02-24 ENCOUNTER — Ambulatory Visit (INDEPENDENT_AMBULATORY_CARE_PROVIDER_SITE_OTHER): Payer: Medicare Other | Admitting: Family Medicine

## 2020-02-24 VITALS — BP 130/70 | HR 102 | Temp 97.9°F | Ht 71.0 in | Wt 225.4 lb

## 2020-02-24 DIAGNOSIS — N138 Other obstructive and reflux uropathy: Secondary | ICD-10-CM

## 2020-02-24 DIAGNOSIS — E785 Hyperlipidemia, unspecified: Secondary | ICD-10-CM

## 2020-02-24 DIAGNOSIS — R600 Localized edema: Secondary | ICD-10-CM

## 2020-02-24 DIAGNOSIS — N401 Enlarged prostate with lower urinary tract symptoms: Secondary | ICD-10-CM | POA: Diagnosis not present

## 2020-02-24 DIAGNOSIS — K219 Gastro-esophageal reflux disease without esophagitis: Secondary | ICD-10-CM

## 2020-02-24 DIAGNOSIS — G20 Parkinson's disease: Secondary | ICD-10-CM | POA: Diagnosis not present

## 2020-02-24 NOTE — Progress Notes (Signed)
Subjective:    Patient ID: [name] [name], male    DOB: 10-17-1950, 69 y.o.   MRN: [id number]  HPI Here to follow up on issues. His Parkinson's is amazingly stable, and he sees Dr. Carles Collet regularly. His Bp is stable. He has been seeing Dr. Oneida Alar for venous insufficiency in the legs and he wears compression stockings daily. He has a small superficial thrombosis in the right lower leg but anticoagulation was not needed.    Review of Systems  Constitutional: Negative.   HENT: Negative.   Eyes: Negative.   Respiratory: Negative.   Cardiovascular: Positive for leg swelling.  Gastrointestinal: Negative.   Genitourinary: Negative.   Musculoskeletal: Positive for back pain.  Skin: Negative.   Neurological: Positive for tremors.  Psychiatric/Behavioral: Negative.        Objective:   Physical Exam Constitutional:      General: He is not in acute distress.    Appearance: He is well-developed. He is not diaphoretic.  HENT:     Head: Normocephalic and atraumatic.     Right Ear: External ear normal.     Left Ear: External ear normal.     Nose: Nose normal.     Mouth/Throat:     Pharynx: No oropharyngeal exudate.  Eyes:     General: No scleral icterus.       Right eye: No discharge.        Left eye: No discharge.     Conjunctiva/sclera: Conjunctivae normal.     Pupils: Pupils are equal, round, and reactive to light.  Neck:     Thyroid: No thyromegaly.     Vascular: No JVD.     Trachea: No tracheal deviation.  Cardiovascular:     Rate and Rhythm: Normal rate and regular rhythm.     Heart sounds: Normal heart sounds. No murmur heard.  No friction rub. No gallop.   Pulmonary:     Effort: Pulmonary effort is normal. No respiratory distress.     Breath sounds: Normal breath sounds. No wheezing or rales.  Chest:     Chest wall: No tenderness.  Abdominal:     General: Bowel sounds are normal. There is no distension.     Palpations: Abdomen is soft. There is no mass.      Tenderness: There is no abdominal tenderness. There is no guarding or rebound.  Genitourinary:    Penis: Normal. No tenderness.      Testes: Normal.     Prostate: Normal.     Rectum: Normal. Guaiac result negative.  Musculoskeletal:        General: No tenderness. Normal range of motion.     Cervical back: Neck supple.  Lymphadenopathy:     Cervical: No cervical adenopathy.  Skin:    General: Skin is warm and dry.     Coloration: Skin is not pale.     Findings: No erythema or rash.  Neurological:     Mental Status: He is alert and oriented to person, place, and time.     Cranial Nerves: No cranial nerve deficit.     Motor: No abnormal muscle tone.     Coordination: Coordination normal.     Deep Tendon Reflexes: Reflexes are normal and symmetric. Reflexes normal.     Comments: Resting tremor of the right arm and hand   Psychiatric:        Behavior: Behavior normal.        Thought Content: Thought content normal.  Judgment: Judgment normal.           Assessment & Plan:  His Parkinson's is stable. His GERD is stable. The low back pain is stable. We will get fasting labs to check lipids, etc.  [name], MD

## 2020-02-25 ENCOUNTER — Encounter: Payer: Medicare Other | Admitting: Family Medicine

## 2020-02-25 DIAGNOSIS — M545 Low back pain: Secondary | ICD-10-CM | POA: Diagnosis not present

## 2020-02-25 DIAGNOSIS — M6281 Muscle weakness (generalized): Secondary | ICD-10-CM | POA: Diagnosis not present

## 2020-02-25 LAB — CBC WITH DIFFERENTIAL/PLATELET
Absolute Monocytes: 468 cells/uL (ref 200–950)
Basophils Absolute: 39 cells/uL (ref 0–200)
Basophils Relative: 0.7 %
Eosinophils Absolute: 88 cells/uL (ref 15–500)
Eosinophils Relative: 1.6 %
HCT: 41.1 % (ref 38.5–50.0)
Hemoglobin: 14.4 g/dL (ref 13.2–17.1)
Lymphs Abs: 886 cells/uL (ref 850–3900)
MCH: 31 pg (ref 27.0–33.0)
MCHC: 35 g/dL (ref 32.0–36.0)
MCV: 88.4 fL (ref 80.0–100.0)
MPV: 9.2 fL (ref 7.5–12.5)
Monocytes Relative: 8.5 %
Neutro Abs: 4021 cells/uL (ref 1500–7800)
Neutrophils Relative %: 73.1 %
Platelets: 178 10*3/uL (ref 140–400)
RBC: 4.65 10*6/uL (ref 4.20–5.80)
RDW: 13.7 % (ref 11.0–15.0)
Total Lymphocyte: 16.1 %
WBC: 5.5 10*3/uL (ref 3.8–10.8)

## 2020-02-25 LAB — HEPATIC FUNCTION PANEL
AG Ratio: 1.8 (calc) (ref 1.0–2.5)
ALT: 22 U/L (ref 9–46)
AST: 22 U/L (ref 10–35)
Albumin: 4.2 g/dL (ref 3.6–5.1)
Alkaline phosphatase (APISO): 84 U/L (ref 35–144)
Bilirubin, Direct: 0.4 mg/dL — ABNORMAL HIGH (ref 0.0–0.2)
Globulin: 2.3 g/dL (calc) (ref 1.9–3.7)
Indirect Bilirubin: 2 mg/dL (calc) — ABNORMAL HIGH (ref 0.2–1.2)
Total Bilirubin: 2.4 mg/dL — ABNORMAL HIGH (ref 0.2–1.2)
Total Protein: 6.5 g/dL (ref 6.1–8.1)

## 2020-02-25 LAB — PSA: PSA: 0.3 ng/mL (ref <=4.0)

## 2020-02-25 LAB — BASIC METABOLIC PANEL
BUN/Creatinine Ratio: 22 (calc) (ref 6–22)
BUN: 14 mg/dL (ref 7–25)
CO2: 26 mmol/L (ref 20–32)
Calcium: 9.2 mg/dL (ref 8.6–10.3)
Chloride: 102 mmol/L (ref 98–110)
Creat: 0.65 mg/dL — ABNORMAL LOW (ref 0.70–1.25)
Glucose, Bld: 112 mg/dL — ABNORMAL HIGH (ref 65–99)
Potassium: 3.3 mmol/L — ABNORMAL LOW (ref 3.5–5.3)
Sodium: 137 mmol/L (ref 135–146)

## 2020-02-25 LAB — LIPID PANEL
Cholesterol: 140 mg/dL (ref <200)
HDL: 40 mg/dL (ref >=40)
LDL Cholesterol (Calc): 75 mg/dL (calc)
Non-HDL Cholesterol (Calc): 100 mg/dL (calc) (ref <130)
Total CHOL/HDL Ratio: 3.5 (calc) (ref <5.0)
Triglycerides: 148 mg/dL (ref <150)

## 2020-02-25 LAB — TSH: TSH: 1.97 mIU/L (ref 0.40–4.50)

## 2020-02-29 ENCOUNTER — Other Ambulatory Visit: Payer: Self-pay

## 2020-02-29 MED ORDER — POTASSIUM CHLORIDE ER 10 MEQ PO TBCR: 10.0000 meq | EXTENDED_RELEASE_TABLET | Freq: Every day | ORAL | 3 refills | Status: DC

## 2020-03-03 DIAGNOSIS — M6281 Muscle weakness (generalized): Secondary | ICD-10-CM | POA: Diagnosis not present

## 2020-03-03 DIAGNOSIS — M545 Low back pain: Secondary | ICD-10-CM | POA: Diagnosis not present

## 2020-03-09 ENCOUNTER — Ambulatory Visit: Payer: Medicare Other | Admitting: Vascular Surgery

## 2020-03-09 ENCOUNTER — Encounter (HOSPITAL_COMMUNITY): Payer: Medicare Other

## 2020-03-10 DIAGNOSIS — M6281 Muscle weakness (generalized): Secondary | ICD-10-CM | POA: Diagnosis not present

## 2020-03-10 DIAGNOSIS — M545 Low back pain: Secondary | ICD-10-CM | POA: Diagnosis not present

## 2020-04-06 ENCOUNTER — Ambulatory Visit (HOSPITAL_COMMUNITY)
Admission: RE | Admit: 2020-04-06 | Discharge: 2020-04-06 | Disposition: A | Discharge status: Home / Self Care | Payer: Medicare Other | Source: Ambulatory Visit | Attending: Vascular Surgery | Admitting: Vascular Surgery

## 2020-04-06 ENCOUNTER — Encounter: Payer: Self-pay | Admitting: Vascular Surgery

## 2020-04-06 ENCOUNTER — Ambulatory Visit (INDEPENDENT_AMBULATORY_CARE_PROVIDER_SITE_OTHER): Payer: Medicare Other | Admitting: Vascular Surgery

## 2020-04-06 ENCOUNTER — Other Ambulatory Visit: Payer: Self-pay

## 2020-04-06 VITALS — BP 131/86 | HR 87 | Temp 97.5°F | Resp 18 | Ht 71.0 in | Wt 223.2 lb

## 2020-04-06 DIAGNOSIS — I998 Other disorder of circulatory system: Secondary | ICD-10-CM | POA: Insufficient documentation

## 2020-04-06 DIAGNOSIS — I8001 Phlebitis and thrombophlebitis of superficial vessels of right lower extremity: Secondary | ICD-10-CM | POA: Insufficient documentation

## 2020-04-06 NOTE — Progress Notes (Signed)
Patient is a 69 year old male who returns for follow-up today.  He previously had a cellulitis and superficial thrombophlebitis in July of this year.  He states that compression has reduce the swelling and the redness in his right leg.  He does have some difficulty with compression stockings secondary to arthritis in his foot.  He has not had any other events requiring antibiotics.  Review of systems: He does not have fever or chills.  Physical exam:  Vitals:   04/06/20 1550  BP: 131/86  Pulse: 87  Resp: 18  Temp: (!) 97.5 F (36.4 C)  TempSrc: Temporal  SpO2: 98%  Weight: 223 lb 3.2 oz (101.2 kg)  Height: 5\' 11"  (1.803 m)    Extremities: 2+ dorsalis pedis pulse right foot  Skin: No rash diffuse blanching erythema circumferentially from just below the knee down to the ankle slightly improved from dark red to light pink at this point.  No obvious surface varicosities no ulcer  Data: Patient had a venous duplex exam today which showed no evidence of DVT in the right leg.  There was some chronic thrombus in the distal right greater saphenous vein.  There was some reflux in the mid right greater saphenous vein but no reflux at the saphenofemoral junction.  Vein diameter was about 4 mm.  Assessment: Improving status post right leg cellulitis with mild reflux but not significant enough to consider laser ablation and no reflux at the saphenofemoral junction.  Plan: Patient will continue with his compression therapy.  If he has recurrent cellulitis or worsening problems with swelling in the right leg in the future he could certainly return for further follow-up and evaluation.  Ruta Hinds, MD [facility] [place] Office: [phone]

## 2020-04-28 DIAGNOSIS — Z471 Aftercare following joint replacement surgery: Secondary | ICD-10-CM | POA: Diagnosis not present

## 2020-04-28 DIAGNOSIS — Z96651 Presence of right artificial knee joint: Secondary | ICD-10-CM | POA: Diagnosis not present

## 2020-05-11 ENCOUNTER — Other Ambulatory Visit: Payer: Self-pay | Admitting: Family Medicine

## 2020-05-13 ENCOUNTER — Other Ambulatory Visit: Payer: Self-pay

## 2020-05-13 ENCOUNTER — Telehealth: Payer: Self-pay | Admitting: Neurology

## 2020-05-13 MED ORDER — PRAMIPEXOLE DIHYDROCHLORIDE ER 1.5 MG PO TB24: 1.5000 mg | ORAL_TABLET | Freq: Every day | ORAL | 0 refills | Status: DC

## 2020-05-13 NOTE — Telephone Encounter (Signed)
Sent to have refilled by Provider.

## 2020-05-13 NOTE — Telephone Encounter (Signed)
Patient called and said he will need a one month Rx for Mirapex Er 1.5 MG. He said he will probably have his medications changed at this next visit in November 2021 so only needs a 30 day supply to last until then to a local pharmacy instead of mail order.  [facility] on [address]

## 2020-05-30 ENCOUNTER — Other Ambulatory Visit: Payer: Self-pay

## 2020-05-30 NOTE — Telephone Encounter (Signed)
Patient needing a refill on medication    traMADol (ULTRAM) 50 MG tablet   [facility], [place]

## 2020-05-31 NOTE — Progress Notes (Signed)
Assessment/Plan:   1.  Parkinsons Disease  -Continue pramipexole ER, 1.5 mg daily  -start carbidopa/levodopa 25/100 and work to 6am/10am/3pm  2. Venous insufficiency  -Following with primary care and vascular surgery.  3.  Chronic lbp  -following with sx Subjective:   [name] [name] was seen today in follow up for Parkinsons disease.  My previous records were reviewed prior to todays visit as well as outside records available to me. Pt states that tremor is worse since last visit.  Pt thinks time for medication change.  One fall - outside playing with the dog and tripped over stone outside.  Pt denies lightheadedness, near syncope.  No hallucinations.  Mood has been good. Patient last saw Dr. Oneida Alar on September 22. Patient was doing well at that time. No evidence of DVT in his leg.  Having continued issues with chronic LBP.  Is forward flexing but attributes to back issues because its painful.  Going 3-4 days at the gym.     Current prescribed movement disorder medications: Pramipexole ER, 1.5 mg daily   PREVIOUS MEDICATIONS: Mirapex  ALLERGIES:   Allergies  Allergen Reactions  . Penicillins Rash and Other (See Comments)    Did it involve swelling of the face/tongue/throat, SOB, or low BP? No Did it involve sudden or severe rash/hives, skin peeling, or any reaction on the inside of your mouth or nose? No Did you need to seek medical attention at a hospital or doctor's office? No When did it last happen?childhood allergy If all above answers are "NO", may proceed with cephalosporin use.      CURRENT MEDICATIONS:  Outpatient Encounter Medications as of 06/03/2020  Medication Sig  . atorvastatin (LIPITOR) 20 MG tablet Take 1 tablet (20 mg total) by mouth daily.  Marland Kitchen esomeprazole (NEXIUM) 40 MG capsule Take 1 capsule (40 mg total) by mouth daily.  . finasteride (PROSCAR) 5 MG tablet Take 1 tablet (5 mg total) by mouth daily.  . hydrochlorothiazide (MICROZIDE) 12.5  MG capsule Take 1 capsule (12.5 mg total) by mouth daily.  . ondansetron (ZOFRAN) 8 MG tablet Take 1 tablet (8 mg total) by mouth every 8 (eight) hours as needed for nausea or vomiting.  . polyethylene glycol (MIRALAX / GLYCOLAX) packet Take 8.5 g by mouth at bedtime.   . Pramipexole Dihydrochloride (MIRAPEX ER) 1.5 MG TB24 Take 1 tablet (1.5 mg total) by mouth daily.  . tamsulosin (FLOMAX) 0.4 MG CAPS capsule TAKE 1 CAPSULE(0.4 MG) BY MOUTH DAILY  . traMADol (ULTRAM) 50 MG tablet Take 2 tablets (100 mg total) by mouth every 6 (six) hours as needed.  . potassium chloride (KLOR-CON) 10 MEQ tablet Take 1 tablet (10 mEq total) by mouth daily.  . [DISCONTINUED] traMADol (ULTRAM) 50 MG tablet Take 2 tablets (100 mg total) by mouth every 6 (six) hours as needed.   No facility-administered encounter medications on file as of 06/03/2020.    Objective:   PHYSICAL EXAMINATION:    VITALS:   Vitals:   06/03/20 0906  BP: (!) 148/83  Pulse: 93  Resp: 20  SpO2: 95%  Weight: 225 lb (102.1 kg)  Height: 5\' 11"  (1.803 m)    GEN:  The patient appears stated age and is in NAD. HEENT:  Normocephalic, atraumatic.  The mucous membranes are moist. The superficial temporal arteries are without ropiness or tenderness. CV:  RRR Lungs:  CTAB Neck/HEME:  There are no carotid bruits bilaterally.  Neurological examination:  Orientation: The patient is alert and  oriented x3. Cranial nerves: There is good facial symmetry with minimal facial hypomimia. The speech is fluent and clear. Soft palate rises symmetrically and there is no tongue deviation. Hearing is intact to conversational tone. Sensation: Sensation is intact to light touch throughout Motor: Strength is at least antigravity x4.  Movement examination: Tone: There is mild increased tone in the RUE Abnormal movements: there is RUE rest tremor Coordination:  There is mild decremation with RAM's, with hand opening and closing, finger taps on the right  and mild finger taps on the L Gait and Station: The patient has no difficulty arising out of a deep-seated chair without the use of the hands. The patient's stride length is good but he is flexed at the waist.    I have reviewed and interpreted the following labs independently    Chemistry      Component Value Date/Time   NA 137 02/24/2020 1208   K 3.3 (L) 02/24/2020 1208   CL 102 02/24/2020 1208   CO2 26 02/24/2020 1208   BUN 14 02/24/2020 1208   CREATININE 0.65 (L) 02/24/2020 1208      Component Value Date/Time   CALCIUM 9.2 02/24/2020 1208   ALKPHOS 90 01/23/2019 1118   AST 22 02/24/2020 1208   ALT 22 02/24/2020 1208   BILITOT 2.4 (H) 02/24/2020 1208       Lab Results  Component Value Date   WBC 5.5 02/24/2020   HGB 14.4 02/24/2020   HCT 41.1 02/24/2020   MCV 88.4 02/24/2020   PLT 178 02/24/2020    Lab Results  Component Value Date   TSH 1.97 02/24/2020     Total time spent on today's visit was 30 minutes, including both face-to-face time and nonface-to-face time.  Time included that spent on review of records (prior notes available to me/labs/imaging if pertinent), discussing treatment and goals, answering patient's questions and coordinating care.  Cc:  [name], MD

## 2020-06-01 MED ORDER — TRAMADOL HCL 50 MG PO TABS: 100.0000 mg | ORAL_TABLET | Freq: Four times a day (QID) | ORAL | 1 refills | Status: DC | PRN

## 2020-06-03 ENCOUNTER — Ambulatory Visit (INDEPENDENT_AMBULATORY_CARE_PROVIDER_SITE_OTHER): Payer: Medicare Other | Admitting: Neurology

## 2020-06-03 ENCOUNTER — Other Ambulatory Visit: Payer: Self-pay

## 2020-06-03 ENCOUNTER — Encounter: Payer: Self-pay | Admitting: Neurology

## 2020-06-03 VITALS — BP 148/83 | HR 93 | Resp 20 | Ht 71.0 in | Wt 225.0 lb

## 2020-06-03 DIAGNOSIS — G20 Parkinson's disease: Secondary | ICD-10-CM

## 2020-06-03 MED ORDER — PRAMIPEXOLE DIHYDROCHLORIDE ER 1.5 MG PO TB24: 1.5000 mg | ORAL_TABLET | Freq: Every day | ORAL | 1 refills | Status: DC

## 2020-06-03 MED ORDER — CARBIDOPA-LEVODOPA 25-100 MG PO TABS: 1.0000 | ORAL_TABLET | Freq: Three times a day (TID) | ORAL | 1 refills | Status: DC

## 2020-06-03 NOTE — Patient Instructions (Addendum)
1.  Start Carbidopa Levodopa as follows:  Take 1/2 tablet three times daily, at least 30 minutes before meals (approximately 6am/10am/3pm), for one week  Then take 1/2 tablet in the morning, 1/2 tablet in the afternoon, 1 tablet in the evening, at least 30 minutes before meals, for one week  Then take 1/2 tablet in the morning, 1 tablet in the afternoon, 1 tablet in the evening, at least 30 minutes before meals, for one week  Then take 1 tablet three times daily at 6am/10am/3pm, at least 30 minutes before meals   As a reminder, carbidopa/levodopa can be taken at the same time as a carbohydrate, but we like to have you take your pill either 30 minutes before a protein source or 1 hour after as protein can interfere with carbidopa/levodopa absorption.  2.  Take pramipexole ER, 1.5 mg daily  The physicians and staff at [facility] are committed to providing excellent care. You may receive a survey requesting feedback about your experience at our office. We strive to receive "very good" responses to the survey questions. If you feel that your experience would prevent you from giving the office a "very good " response, please contact our office to try to remedy the situation. We may be reached at [phone]. Thank you for taking the time out of your busy day to complete the survey.

## 2020-06-14 ENCOUNTER — Telehealth: Payer: Self-pay | Admitting: Family Medicine

## 2020-06-14 NOTE — Telephone Encounter (Signed)
Pt called stated [facility] need more information about the prescription refill for Tramadol 50MG  before they refill it. Also the patient is completely out of meds. Please call [phone]

## 2020-06-15 NOTE — Telephone Encounter (Signed)
I spoke with pt's wife - please send the new Rx to the [facility] on [address]. I have pended it for you. [facility] rx.

## 2020-06-15 NOTE — Telephone Encounter (Signed)
I spoke with pharmacy. Since pt has not had Rx filled since 09/2019 they will not fill the quantity of 360 tablets. They recommend decreasing the quantity to 120 tablets since it's been 8-9 months since pt had the Rx filled. A new rx will have to be sent for a new quantity.

## 2020-06-16 MED ORDER — TRAMADOL HCL 50 MG PO TABS: 100.0000 mg | ORAL_TABLET | Freq: Four times a day (QID) | ORAL | 5 refills | Status: DC | PRN

## 2020-07-20 ENCOUNTER — Telehealth: Payer: Self-pay | Admitting: Family Medicine

## 2020-07-20 NOTE — Telephone Encounter (Signed)
Left message for patient to call back and schedule Medicare Annual Wellness Visit (AWV) either virtually or in office.   Last AWV no information please schedule at anytime with [facility] Nurse Health Advisor 1 or 2   This should be a 45 minute visit. 

## 2020-08-05 ENCOUNTER — Telehealth: Payer: Self-pay | Admitting: Neurology

## 2020-08-05 NOTE — Telephone Encounter (Signed)
Patient called and said, "The medication Dr. Carles Collet prescribed, carbidopa-levodopa 25-100 mg, is not working well. It really doesn't seem to be making a difference with my symptoms."   [facility] [address] [place]

## 2020-08-05 NOTE — Telephone Encounter (Signed)
I'm not sure I will change anything but put him in Tuesday at 8:15 am to discuss

## 2020-08-05 NOTE — Telephone Encounter (Signed)
Carbidopa-levodopa 25-100mg  at one tablet at 6,10 and 3 and at 7 he takes Mirapex. Please advise. Tremors are getting worse. Somedays are better than other.   Cough over the last 6 months, dry cough, going to see PCP on Monday. Just seen this on TV, and post nasal drip. Is this a chance that this is Parkinsons

## 2020-08-05 NOTE — Telephone Encounter (Signed)
Patient has two dr's appts already on Tuesday, unable to do Tuesday, can you find another spot for next week.

## 2020-08-05 NOTE — Telephone Encounter (Signed)
Patient returned call and left a message to call back.

## 2020-08-05 NOTE — Telephone Encounter (Signed)
No answer again at 324 08/05/2020.

## 2020-08-08 ENCOUNTER — Telehealth (INDEPENDENT_AMBULATORY_CARE_PROVIDER_SITE_OTHER): Payer: Medicare Other | Admitting: Family Medicine

## 2020-08-08 ENCOUNTER — Encounter: Payer: Self-pay | Admitting: Family Medicine

## 2020-08-08 VITALS — Wt 221.0 lb

## 2020-08-08 DIAGNOSIS — K219 Gastro-esophageal reflux disease without esophagitis: Secondary | ICD-10-CM | POA: Diagnosis not present

## 2020-08-08 DIAGNOSIS — J4520 Mild intermittent asthma, uncomplicated: Secondary | ICD-10-CM | POA: Diagnosis not present

## 2020-08-08 MED ORDER — FLOVENT HFA 110 MCG/ACT IN AERO: 2.0000 | INHALATION_SPRAY | Freq: Two times a day (BID) | RESPIRATORY_TRACT | 0 refills | Status: DC

## 2020-08-08 MED ORDER — ALBUTEROL SULFATE HFA 108 (90 BASE) MCG/ACT IN AERS: 2.0000 | INHALATION_SPRAY | RESPIRATORY_TRACT | 0 refills | Status: DC | PRN

## 2020-08-08 NOTE — Progress Notes (Signed)
Assessment/Plan:   1.  Parkinsons Disease  -Continue pramipexole ER, 1.5 mg daily.  We discussed increasing this, but ultimately he decided to hold on that for now.  -Continue carbidopa/levodopa 25/100, 1 tablet at 6 AM/10 AM/3 PM.  Discussed early use of levodopa and recommendations in Parkinson's disease.  2.  Chronic low back pain  -Following surgery  3.  He has a follow-up appointment with me in May and we will keep that.   Subjective:   [name] [name] was seen today in follow up for Parkinsons disease.  My previous records were reviewed prior to todays visit as well as outside records available to me.  Patient worked in to discuss increasing tremor.  He just saw his primary care via video on Monday because of more cough, felt likely secondary to GERD.  Last visit, the patient was started on levodopa.  He reports today that there are days that he thinks that levodopa helps and other times that he thinks it doesn't.  This is unrelated to specific times (no wearing off).  On a bad day, he may have more tremor on the R.  On a good day, he has no tremor. pt denies falls.  Pt denies lightheadedness, near syncope.  No hallucinations. He is staying active and moving.  Current prescribed movement disorder medications: Pramipexole ER, 1.5 mg daily Carbidopa/levodopa 25/100, 1 tablet 3 times per day.    ALLERGIES:   Allergies  Allergen Reactions  . Penicillins Rash and Other (See Comments)    Did it involve swelling of the face/tongue/throat, SOB, or low BP? No Did it involve sudden or severe rash/hives, skin peeling, or any reaction on the inside of your mouth or nose? No Did you need to seek medical attention at a hospital or doctor's office? No When did it last happen?childhood allergy If all above answers are "NO", may proceed with cephalosporin use.      CURRENT MEDICATIONS:  Outpatient Encounter Medications as of 08/11/2020  Medication Sig  . albuterol (VENTOLIN  HFA) 108 (90 Base) MCG/ACT inhaler Inhale 2 puffs into the lungs every 4 (four) hours as needed for wheezing or shortness of breath.  Marland Kitchen atorvastatin (LIPITOR) 20 MG tablet Take 1 tablet (20 mg total) by mouth daily.  . carbidopa-levodopa (SINEMET IR) 25-100 MG tablet Take 1 tablet by mouth 3 (three) times daily. 6am/10am/3pm  . esomeprazole (NEXIUM) 40 MG capsule Take 1 capsule (40 mg total) by mouth daily.  . finasteride (PROSCAR) 5 MG tablet Take 1 tablet (5 mg total) by mouth daily.  . fluticasone (FLOVENT HFA) 110 MCG/ACT inhaler Inhale 2 puffs into the lungs 2 (two) times daily.  . hydrochlorothiazide (MICROZIDE) 12.5 MG capsule Take 1 capsule (12.5 mg total) by mouth daily.  . ondansetron (ZOFRAN) 8 MG tablet Take 1 tablet (8 mg total) by mouth every 8 (eight) hours as needed for nausea or vomiting.  . polyethylene glycol (MIRALAX / GLYCOLAX) packet Take 8.5 g by mouth at bedtime.  . potassium chloride (KLOR-CON) 10 MEQ tablet Take 1 tablet (10 mEq total) by mouth daily.  . Pramipexole Dihydrochloride (MIRAPEX ER) 1.5 MG TB24 Take 1 tablet (1.5 mg total) by mouth daily.  . tamsulosin (FLOMAX) 0.4 MG CAPS capsule TAKE 1 CAPSULE(0.4 MG) BY MOUTH DAILY  . traMADol (ULTRAM) 50 MG tablet Take 2 tablets (100 mg total) by mouth every 6 (six) hours as needed.   No facility-administered encounter medications on file as of 08/11/2020.    Objective:  PHYSICAL EXAMINATION:    VITALS:   Vitals:   08/11/20 1523  BP: 136/82  Pulse: 68  SpO2: 98%  Weight: 234 lb (106.1 kg)  Height: 5' 10.5" (1.791 m)   Wt Readings from Last 3 Encounters:  08/11/20 234 lb (106.1 kg)  08/08/20 221 lb (100.2 kg)  06/03/20 225 lb (102.1 kg)     GEN:  The patient appears stated age and is in NAD. HEENT:  Normocephalic, atraumatic.  The mucous membranes are moist.   Neurological examination:  Orientation: The patient is alert and oriented x3. Cranial nerves: There is good facial symmetry without facial  hypomimia. The speech is fluent and clear. Soft palate rises symmetrically and there is no tongue deviation. Hearing is intact to conversational tone. Sensation: Sensation is intact to light touch throughout Motor: Strength is at least antigravity x4.  Movement examination: Tone: There is mild increased tone in the RUE Abnormal movements: there is RUE rest tremor Coordination:  There is minimal decremation with RAM's with hand opening and closing on the right Gait and Station: The patient has no difficulty arising out of a deep-seated chair without the use of the hands. The patient's stride length is good with slight flexed at the waist.    I have reviewed and interpreted the following labs independently    Chemistry      Component Value Date/Time   NA 137 02/24/2020 1208   K 3.3 (L) 02/24/2020 1208   CL 102 02/24/2020 1208   CO2 26 02/24/2020 1208   BUN 14 02/24/2020 1208   CREATININE 0.65 (L) 02/24/2020 1208      Component Value Date/Time   CALCIUM 9.2 02/24/2020 1208   ALKPHOS 90 01/23/2019 1118   AST 22 02/24/2020 1208   ALT 22 02/24/2020 1208   BILITOT 2.4 (H) 02/24/2020 1208       Lab Results  Component Value Date   WBC 5.5 02/24/2020   HGB 14.4 02/24/2020   HCT 41.1 02/24/2020   MCV 88.4 02/24/2020   PLT 178 02/24/2020    Lab Results  Component Value Date   TSH 1.97 02/24/2020     Total time spent on today's visit was 25 minutes, including both face-to-face time and nonface-to-face time.  Time included that spent on review of records (prior notes available to me/labs/imaging if pertinent), discussing treatment and goals, answering patient's questions and coordinating care.  Cc:  [name], MD

## 2020-08-08 NOTE — Progress Notes (Signed)
Subjective:    Patient ID: POLK MINOR, male    DOB: 11-13-1950, 70 y.o.   MRN: [id number]  HPI Virtual Visit via Video Note  I connected with the patient on 08/08/20 at  9:00 AM EST by a video enabled telemedicine application and verified that I am speaking with the correct person using two identifiers.  Location patient: home Location provider:work or home office Persons participating in the virtual visit: patient, provider  I discussed the limitations of evaluation and management by telemedicine and the availability of in person appointments. The patient expressed understanding and agreed to proceed.   HPI: Here describing a frequent dry cough that started about 3 months ago. It often bothers him at night. He has a lot of PND at times. He has frequent GERD and he has stated taking the Nexium twice a day. He sometimes feels mildly SOB and he wheezes also. No chest pain or fever.    ROS: See pertinent positives and negatives per HPI.  Past Medical History:  Diagnosis Date  . Arthritis   . Barrett's esophagus    sees Dr. Delfin Edis   . BPH with urinary obstruction   . Complication of anesthesia    Urinary retention. "fights it"- is what he was told. 1990's - vomitted and aspirated in surgery  . Diverticulosis   . Esophageal dysmotility   . GERD (gastroesophageal reflux disease)    not since [facility] Fundopliation  . History of hiatal hernia   . History of kidney stones    15 in 2015- last one was in Oct 2015- takes HCTZ to help prevent  . Hyperlipidemia   . Insomnia   . Lyme disease 2013  . Parkinson's disease [facility])    sees Dr. Wells Guiles Tat   . PONV (postoperative nausea and vomiting)   . RMSF [facility] spotted fever) 2012    Past Surgical History:  Procedure Laterality Date  . CHOLECYSTECTOMY    . COLONOSCOPY  06-24-13   per Dr. Olevia Perches, no polyps, diveticulosis only, repeat in 10 yrs   . ESOPHAGOGASTRODUODENOSCOPY  06-24-13   per Dr. Olevia Perches, no signs  of Barretts, repeat in 5 yrs   . HERNIA REPAIR Right    right inguinal  . LASIK     bilateral  . lipomas removed     x10/ on arms and legs  . NISSEN FUNDOPLICATION  6-57-84   laparoscopic per Dr. Johnathan Hausen  . ORCHIOPEXY     right side at age 67   . TONSILLECTOMY AND ADENOIDECTOMY    . TOTAL HIP ARTHROPLASTY Left 05/26/2018   Procedure: TOTAL HIP ARTHROPLASTY ANTERIOR APPROACH;  Surgeon: Dorna Leitz, MD;  Location: [place];  Service: Orthopedics;  Laterality: Left;  . TOTAL KNEE ARTHROPLASTY Right 06/01/2019   Procedure: RIGHT TOTAL KNEE ARTHROPLASTY;  Surgeon: Frederik Pear, MD;  Location: [facility];  Service: Orthopedics;  Laterality: Right;  . TOTAL SHOULDER ARTHROPLASTY Right 06/23/2015   Procedure: RIGHT TOTAL SHOULDER ARTHROPLASTY;  Surgeon: Justice Britain, MD;  Location: [place];  Service: Orthopedics;  Laterality: Right;    Family History  Problem Relation Age of Onset  . Arthritis Mother   . Cancer Mother   . Esophageal cancer Father   . Lung cancer Maternal Grandfather   . Esophageal cancer Paternal Grandmother   . Lung cancer Paternal Grandfather   . Colon cancer Sister        questionable  . Hypertension Brother        over [employer]  .  Healthy Daughter   . Esophageal cancer Sister   . Healthy Daughter   . Esophageal cancer Paternal Aunt        x 2  . Esophageal cancer Cousin   . Rectal cancer Neg Hx   . Stomach cancer Neg Hx      Current Outpatient Medications:  .  atorvastatin (LIPITOR) 20 MG tablet, Take 1 tablet (20 mg total) by mouth daily., Disp: 90 tablet, Rfl: 3 .  carbidopa-levodopa (SINEMET IR) 25-100 MG tablet, Take 1 tablet by mouth 3 (three) times daily. 6am/10am/3pm, Disp: 270 tablet, Rfl: 1 .  esomeprazole (NEXIUM) 40 MG capsule, Take 1 capsule (40 mg total) by mouth daily., Disp: 90 capsule, Rfl: 3 .  hydrochlorothiazide (MICROZIDE) 12.5 MG capsule, Take 1 capsule (12.5 mg total) by mouth daily., Disp: 90 capsule, Rfl: 3 .  ondansetron (ZOFRAN) 8 MG  tablet, Take 1 tablet (8 mg total) by mouth every 8 (eight) hours as needed for nausea or vomiting., Disp: 60 tablet, Rfl: 5 .  polyethylene glycol (MIRALAX / GLYCOLAX) packet, Take 8.5 g by mouth at bedtime., Disp: , Rfl:  .  potassium chloride (KLOR-CON) 10 MEQ tablet, Take 1 tablet (10 mEq total) by mouth daily., Disp: 90 tablet, Rfl: 3 .  Pramipexole Dihydrochloride (MIRAPEX ER) 1.5 MG TB24, Take 1 tablet (1.5 mg total) by mouth daily., Disp: 90 tablet, Rfl: 1 .  tamsulosin (FLOMAX) 0.4 MG CAPS capsule, TAKE 1 CAPSULE(0.4 MG) BY MOUTH DAILY, Disp: 90 capsule, Rfl: 2 .  traMADol (ULTRAM) 50 MG tablet, Take 2 tablets (100 mg total) by mouth every 6 (six) hours as needed., Disp: 240 tablet, Rfl: 5 .  finasteride (PROSCAR) 5 MG tablet, Take 1 tablet (5 mg total) by mouth daily. (Patient not taking: Reported on 08/08/2020), Disp: 90 tablet, Rfl: 3  EXAM:  VITALS per patient if applicable:  GENERAL: alert, oriented, appears well and in no acute distress  HEENT: atraumatic, conjunttiva clear, no obvious abnormalities on inspection of external nose and ears  NECK: normal movements of the head and neck  LUNGS: on inspection no signs of respiratory distress, breathing rate appears normal, no obvious gross SOB, gasping or wheezing  CV: no obvious cyanosis  MS: moves all visible extremities without noticeable abnormality  PSYCH/NEURO: pleasant and cooperative, no obvious depression or anxiety, speech and thought processing grossly intact  ASSESSMENT AND PLAN: He seems to have developed some reactive airways disease from the GERD. He will try using Flovent and Albuterol, and he will report back.  [name], MD  Discussed the following assessment and plan:  No diagnosis found.     I discussed the assessment and treatment plan with the patient. The patient was provided an opportunity to ask questions and all were answered. The patient agreed with the plan and demonstrated an understanding  of the instructions.   The patient was advised to call back or seek an in-person evaluation if the symptoms worsen or if the condition fails to improve as anticipated.     Review of Systems     Objective:   Physical Exam        Assessment & Plan:

## 2020-08-10 DIAGNOSIS — I872 Venous insufficiency (chronic) (peripheral): Secondary | ICD-10-CM | POA: Diagnosis not present

## 2020-08-11 ENCOUNTER — Other Ambulatory Visit: Payer: Self-pay

## 2020-08-11 ENCOUNTER — Encounter: Payer: Self-pay | Admitting: Neurology

## 2020-08-11 ENCOUNTER — Ambulatory Visit (INDEPENDENT_AMBULATORY_CARE_PROVIDER_SITE_OTHER): Payer: Medicare Other | Admitting: Neurology

## 2020-08-11 VITALS — BP 136/82 | HR 68 | Ht 70.5 in | Wt 234.0 lb

## 2020-08-11 DIAGNOSIS — G20 Parkinson's disease: Secondary | ICD-10-CM

## 2020-08-11 NOTE — Patient Instructions (Signed)
No changes in medications.  The physicians and staff at [facility] are committed to providing excellent care. You may receive a survey requesting feedback about your experience at our office. We strive to receive "very good" responses to the survey questions. If you feel that your experience would prevent you from giving the office a "very good " response, please contact our office to try to remedy the situation. We may be reached at [phone]. Thank you for taking the time out of your busy day to complete the survey.

## 2020-08-21 ENCOUNTER — Encounter: Payer: Self-pay | Admitting: Family Medicine

## 2020-09-08 ENCOUNTER — Other Ambulatory Visit: Payer: Self-pay | Admitting: Family Medicine

## 2020-09-09 NOTE — Telephone Encounter (Signed)
Pts spouse is calling in stating that the pt is out of Rx albuterol (VENTOLIN HFA) 108 18 MCG and it states on his box that he has refills (12) but the pharmacy stated that he needs to get approval from the provider before he can get the refills.  Pharm:  [facility] in [place] on [address].

## 2020-09-12 ENCOUNTER — Other Ambulatory Visit: Payer: Self-pay | Admitting: Family Medicine

## 2020-09-12 ENCOUNTER — Other Ambulatory Visit: Payer: Self-pay

## 2020-09-12 MED ORDER — FLOVENT HFA 110 MCG/ACT IN AERO: 2.0000 | INHALATION_SPRAY | Freq: Two times a day (BID) | RESPIRATORY_TRACT | 1 refills | Status: DC

## 2020-09-12 NOTE — Telephone Encounter (Signed)
Pt Rx sent to pharmacy as requested

## 2020-09-14 ENCOUNTER — Other Ambulatory Visit: Payer: Self-pay

## 2020-09-14 ENCOUNTER — Telehealth: Payer: Self-pay | Admitting: Family Medicine

## 2020-09-14 MED ORDER — ALBUTEROL SULFATE HFA 108 (90 BASE) MCG/ACT IN AERS: 2.0000 | INHALATION_SPRAY | RESPIRATORY_TRACT | 0 refills | Status: DC | PRN

## 2020-09-14 NOTE — Telephone Encounter (Signed)
Patient is calling and requesting a refill for albuterol (VENTOLIN HFA) 108 (90 Base) MCG/ACT inhaler sent to [facility] [address] [place]  [place], [place] [address]  Phone:  [phone] Fax:  [phone]  CB is [phone]

## 2020-09-14 NOTE — Telephone Encounter (Signed)
Rx sent 

## 2020-09-28 ENCOUNTER — Other Ambulatory Visit: Payer: Self-pay

## 2020-09-28 ENCOUNTER — Telehealth: Payer: Self-pay | Admitting: Family Medicine

## 2020-09-28 MED ORDER — ONDANSETRON HCL 8 MG PO TABS: 8.0000 mg | ORAL_TABLET | Freq: Three times a day (TID) | ORAL | 1 refills | Status: DC | PRN

## 2020-09-28 NOTE — Telephone Encounter (Signed)
Refill has been sent to [facility] on [address]

## 2020-09-28 NOTE — Telephone Encounter (Signed)
Pts spouse is calling in stating that the pt needs to have Rx ondansetron (ZOFRAN) 8 MG.  Pharm:  [facility] on [address] In [place]

## 2020-10-20 IMAGING — CR DG CHEST 2V
2 series · 2 of 2 positions shown · non-contrast
Comparison: 05/20/2018.

CLINICAL DATA: Preoperative exam for knee replacement.

EXAM:
CHEST - 2 VIEW

[w chest pa]
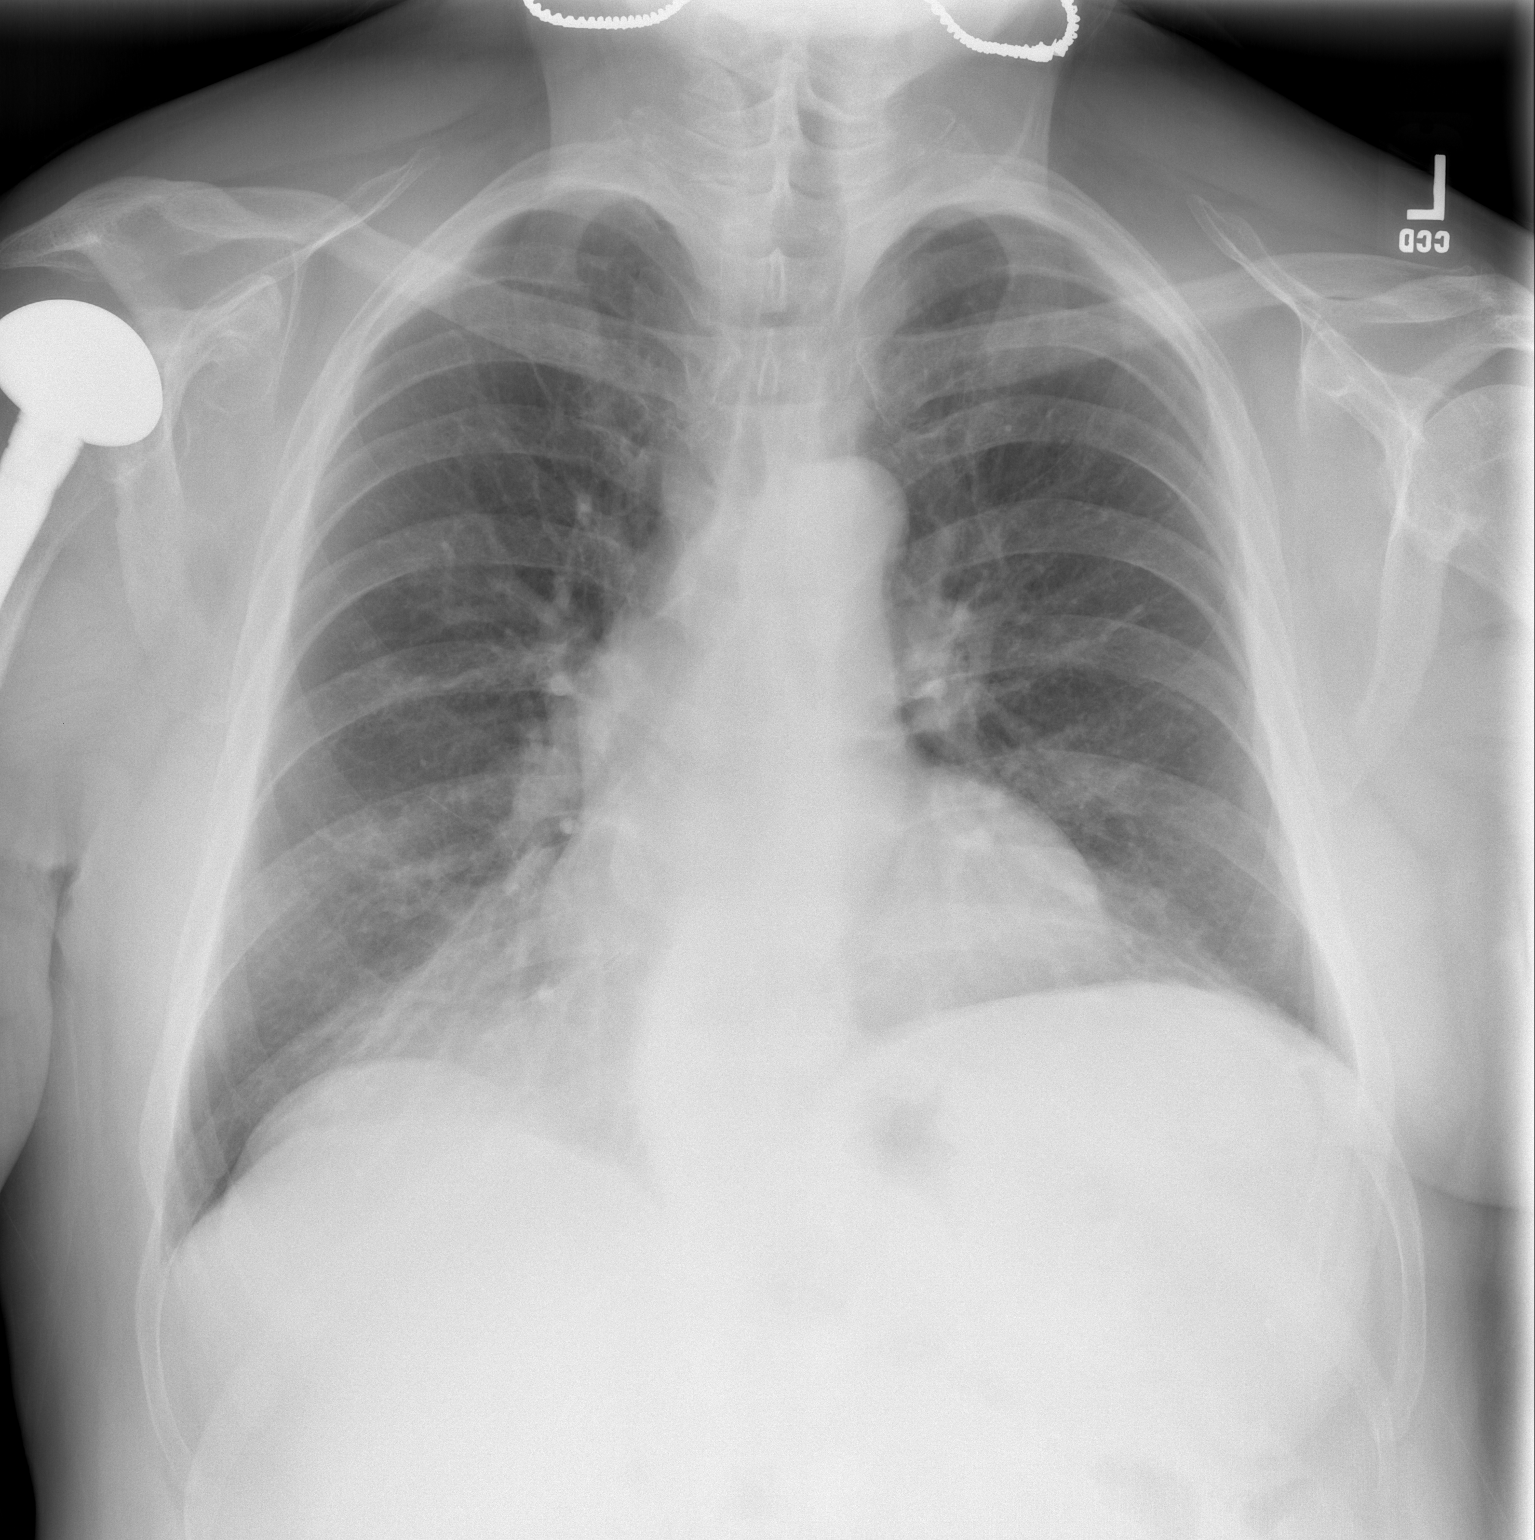

[w chest lat]
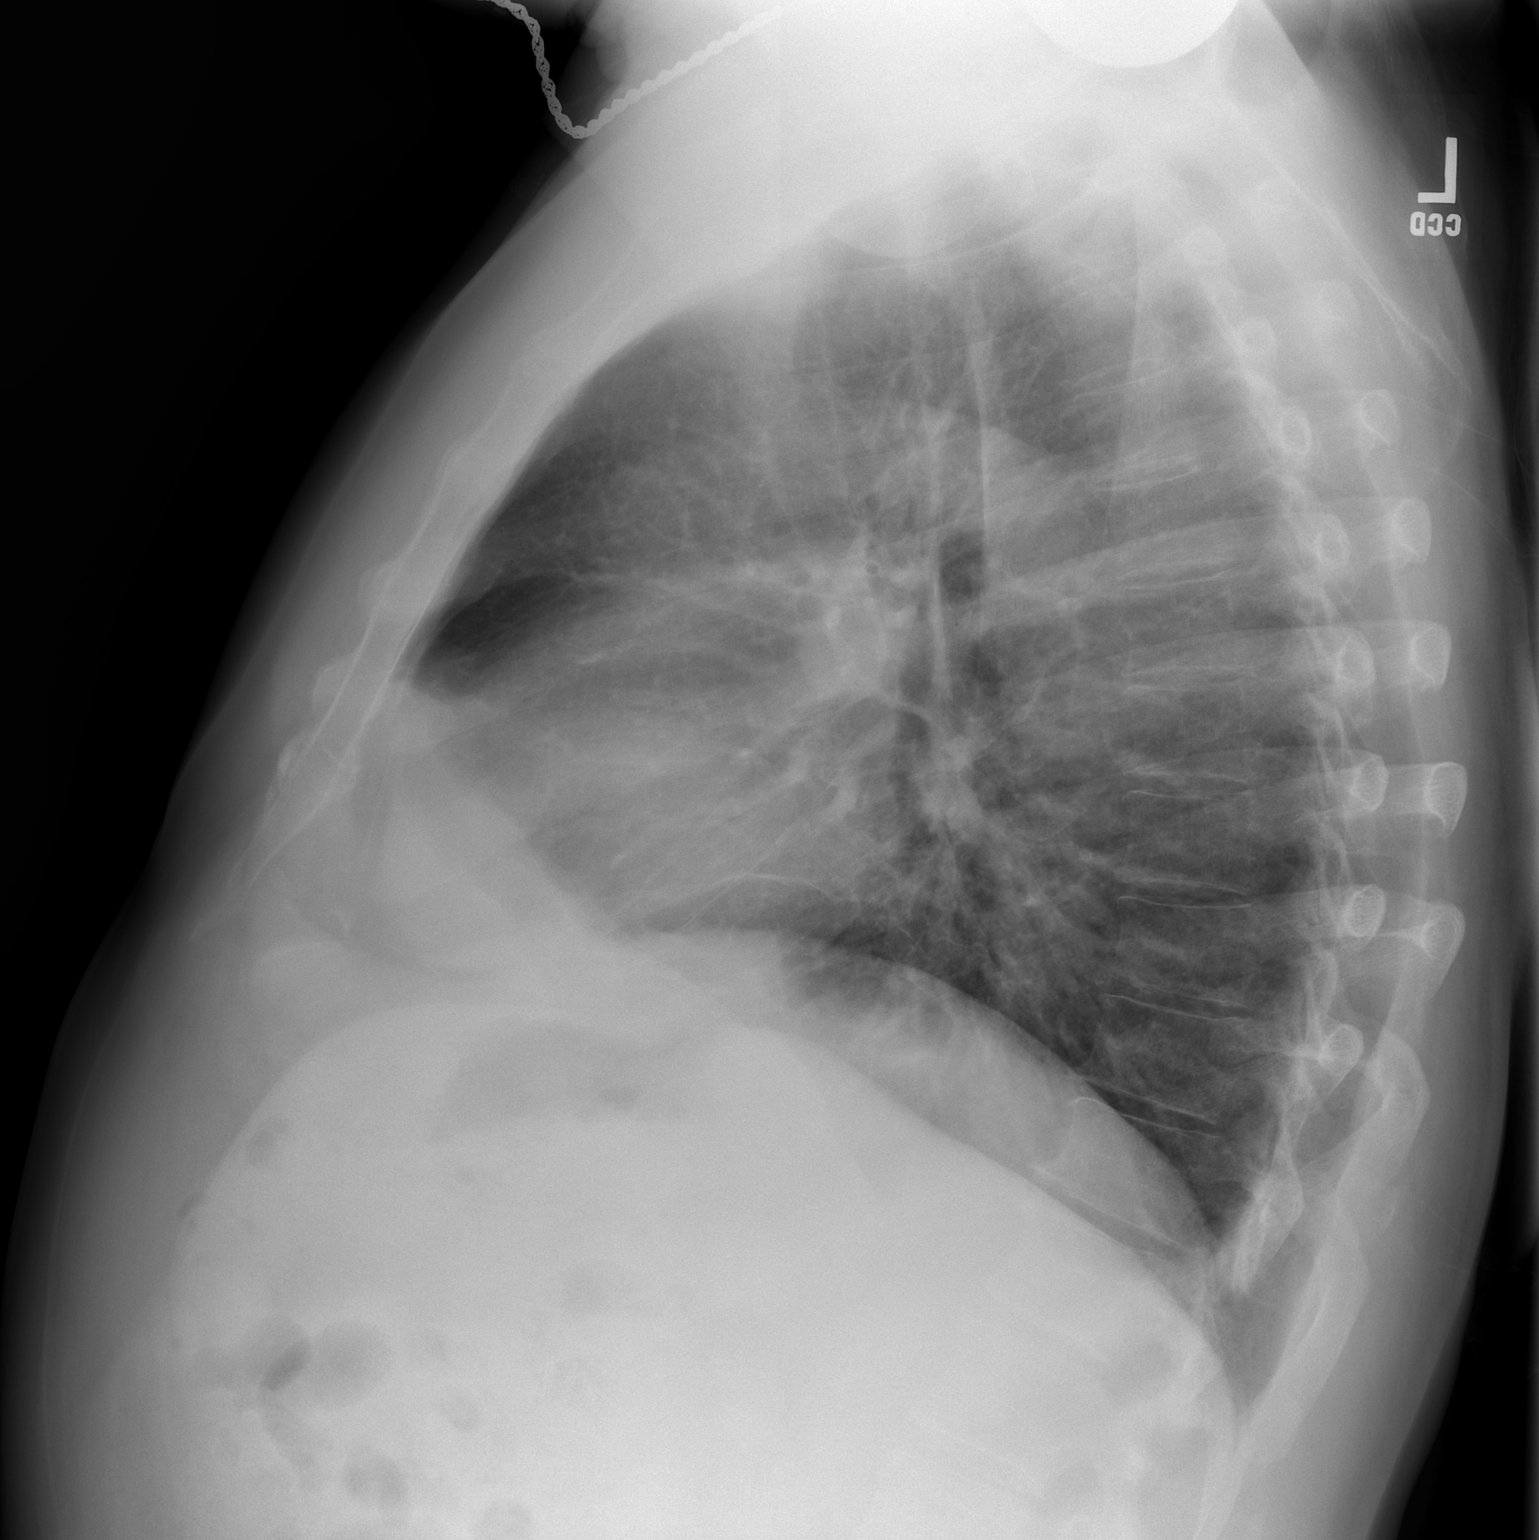

[2 of 2 positions shown; findings below may reference images not displayed]

FINDINGS: Mediastinum hilar structures normal. Right base
atelectasis/infiltrate. No pleural effusion or pneumothorax. Heart
size normal. Thoracic spine scoliosis and degenerative change. Total
right shoulder placement.
IMPRESSION: Right base atelectasis/infiltrate. Follow-up exam suggested to
demonstrate clearing.

These results will be called to the ordering clinician or
representative by the Radiologist Assistant, and communication
documented in the PACS or zVision Dashboard.

## 2020-10-24 ENCOUNTER — Telehealth: Payer: Self-pay | Admitting: Family Medicine

## 2020-10-24 NOTE — Telephone Encounter (Signed)
Patient is calling and requesting a refill for traMADol (ULTRAM) 50 MG tablet and  fluticasone (FLOVENT HFA) 110 MCG/ACT inhaler to be sent to  [facility] [address], [place], [place] [address]  Phone:  [phone] Fax:  [phone]  CB is [phone]

## 2020-10-25 ENCOUNTER — Other Ambulatory Visit: Payer: Self-pay

## 2020-10-25 ENCOUNTER — Telehealth: Payer: Self-pay | Admitting: Family Medicine

## 2020-10-25 MED ORDER — HYDROCHLOROTHIAZIDE 12.5 MG PO CAPS: 12.5000 mg | ORAL_CAPSULE | Freq: Every day | ORAL | 1 refills | Status: DC

## 2020-10-25 MED ORDER — FLOVENT HFA 110 MCG/ACT IN AERO: 2.0000 | INHALATION_SPRAY | Freq: Two times a day (BID) | RESPIRATORY_TRACT | 1 refills | Status: DC

## 2020-10-25 NOTE — Telephone Encounter (Signed)
hydrochlorothiazide (MICROZIDE) 12.5 MG capsule   [facility] - [place], [place] - [address] Phone: [phone]  Fax: [phone]     

## 2020-10-25 NOTE — Telephone Encounter (Signed)
[facility]  

## 2020-10-25 NOTE — Telephone Encounter (Signed)
Last office visit- 08/08/2020 Last refill-06/16/2020  No future appointment has been scheduled

## 2020-10-26 MED ORDER — TRAMADOL HCL 50 MG PO TABS: 100.0000 mg | ORAL_TABLET | Freq: Four times a day (QID) | ORAL | 5 refills | Status: DC | PRN

## 2020-10-26 NOTE — Telephone Encounter (Signed)
Done

## 2020-11-07 DIAGNOSIS — I872 Venous insufficiency (chronic) (peripheral): Secondary | ICD-10-CM | POA: Diagnosis not present

## 2020-11-07 DIAGNOSIS — L218 Other seborrheic dermatitis: Secondary | ICD-10-CM | POA: Diagnosis not present

## 2020-11-14 NOTE — Progress Notes (Signed)
Assessment/Plan:   1.  Parkinsons Disease  -Continue pramipexole ER, 1.5 mg daily.  He has had no compulsive behaviors or sleep attacks.  He is tolerating it well.  -We will continue carbidopa/levodopa 25/100, 1 tablet at 6 AM/10 AM/3 PM  2.  Chronic low back pain  -Status post surgical intervention.  On chronic tramadol. Subjective:   [name] [name] was seen today in follow up for Parkinsons disease.  My previous records were reviewed prior to todays visit as well as outside records available to me. "i'm doing really good."  Pt denies falls.  No compulsive behaviors or sleep attacks.  Pt denies lightheadedness, near syncope.  No hallucinations.  Mood has been good.  At gym 3 days a week and working out a home daily.  Patient does continue to have chronic back and sciatic pain, but states that he is still active and moving.  PDMP is reviewed.  He is getting #240 tramadol per month (although this last month it looks like it lasted him 2 months).  Current prescribed movement disorder medications: Pramipexole ER, 1.5 mg daily Carbidopa/levodopa 25/100, 1 tablet 3 times per day.   PREVIOUS MEDICATIONS: Sinemet and Mirapex  ALLERGIES:   Allergies  Allergen Reactions  . Penicillins Rash and Other (See Comments)    Did it involve swelling of the face/tongue/throat, SOB, or low BP? No Did it involve sudden or severe rash/hives, skin peeling, or any reaction on the inside of your mouth or nose? No Did you need to seek medical attention at a hospital or doctor's office? No When did it last happen?childhood allergy If all above answers are "NO", may proceed with cephalosporin use.      CURRENT MEDICATIONS:  Outpatient Encounter Medications as of 11/15/2020  Medication Sig  . albuterol (VENTOLIN HFA) 108 (90 Base) MCG/ACT inhaler Inhale 2 puffs into the lungs every 4 (four) hours as needed for wheezing or shortness of breath.  Marland Kitchen atorvastatin (LIPITOR) 20 MG tablet Take 1 tablet  (20 mg total) by mouth daily.  . carbidopa-levodopa (SINEMET IR) 25-100 MG tablet Take 1 tablet by mouth 3 (three) times daily. 6am/10am/3pm  . esomeprazole (NEXIUM) 40 MG capsule Take 1 capsule (40 mg total) by mouth daily.  . finasteride (PROSCAR) 5 MG tablet Take 1 tablet (5 mg total) by mouth daily.  . fluticasone (FLOVENT HFA) 110 MCG/ACT inhaler Inhale 2 puffs into the lungs 2 (two) times daily.  . hydrochlorothiazide (MICROZIDE) 12.5 MG capsule Take 1 capsule (12.5 mg total) by mouth daily.  . ondansetron (ZOFRAN) 8 MG tablet Take 1 tablet (8 mg total) by mouth every 8 (eight) hours as needed for nausea or vomiting.  . polyethylene glycol (MIRALAX / GLYCOLAX) packet Take 8.5 g by mouth at bedtime.  . potassium chloride (KLOR-CON) 10 MEQ tablet Take 1 tablet (10 mEq total) by mouth daily.  . Pramipexole Dihydrochloride (MIRAPEX ER) 1.5 MG TB24 Take 1 tablet (1.5 mg total) by mouth daily.  . tamsulosin (FLOMAX) 0.4 MG CAPS capsule TAKE 1 CAPSULE(0.4 MG) BY MOUTH DAILY  . traMADol (ULTRAM) 50 MG tablet Take 2 tablets (100 mg total) by mouth every 6 (six) hours as needed. (Patient not taking: Reported on 11/15/2020)   No facility-administered encounter medications on file as of 11/15/2020.    Objective:   PHYSICAL EXAMINATION:    VITALS:   Vitals:   11/15/20 1253  BP: 118/72  Pulse: 89  SpO2: 95%  Weight: 229 lb (103.9 kg)  Height: 5'  11" (1.803 m)    GEN:  The patient appears stated age and is in NAD. HEENT:  Normocephalic, atraumatic.  The mucous membranes are moist. The superficial temporal arteries are without ropiness or tenderness. CV:  RRR Lungs:  CTAB Neck/HEME:  There are no carotid bruits bilaterally.  Neurological examination:  Orientation: The patient is alert and oriented x3. Cranial nerves: There is good facial symmetry with min facial hypomimia. The speech is fluent and clear. Soft palate rises symmetrically and there is no tongue deviation. Hearing is intact to  conversational tone. Sensation: Sensation is intact to light touch throughout Motor: Strength is at least antigravity x4.  Movement examination: Tone: There is mild increased tone in the RUE Abnormal movements: there is occ RUE rest tremor Coordination:  There is min decremation with RAM's, only with hand opening and closing on the right. Gait and Station: The patient has no difficulty arising out of a deep-seated chair without the use of the hands. The patient's stride length is good.  He is forward flexed at the waist.    I have reviewed and interpreted the following labs independently    Chemistry      Component Value Date/Time   NA 137 02/24/2020 1208   K 3.3 (L) 02/24/2020 1208   CL 102 02/24/2020 1208   CO2 26 02/24/2020 1208   BUN 14 02/24/2020 1208   CREATININE 0.65 (L) 02/24/2020 1208      Component Value Date/Time   CALCIUM 9.2 02/24/2020 1208   ALKPHOS 90 01/23/2019 1118   AST 22 02/24/2020 1208   ALT 22 02/24/2020 1208   BILITOT 2.4 (H) 02/24/2020 1208       Lab Results  Component Value Date   WBC 5.5 02/24/2020   HGB 14.4 02/24/2020   HCT 41.1 02/24/2020   MCV 88.4 02/24/2020   PLT 178 02/24/2020    Lab Results  Component Value Date   TSH 1.97 02/24/2020     Total time spent on today's visit was 25minutes, including both face-to-face time and nonface-to-face time.  Time included that spent on review of records (prior notes available to me/labs/imaging if pertinent), discussing treatment and goals, answering patient's questions and coordinating care.  Cc:  [name], MD

## 2020-11-15 ENCOUNTER — Ambulatory Visit (INDEPENDENT_AMBULATORY_CARE_PROVIDER_SITE_OTHER): Payer: Medicare Other | Admitting: Neurology

## 2020-11-15 ENCOUNTER — Encounter: Payer: Self-pay | Admitting: Neurology

## 2020-11-15 ENCOUNTER — Other Ambulatory Visit: Payer: Self-pay

## 2020-11-15 VITALS — BP 118/72 | HR 89 | Ht 71.0 in | Wt 229.0 lb

## 2020-11-15 DIAGNOSIS — G20 Parkinson's disease: Secondary | ICD-10-CM

## 2020-11-15 MED ORDER — CARBIDOPA-LEVODOPA 25-100 MG PO TABS: 1.0000 | ORAL_TABLET | Freq: Three times a day (TID) | ORAL | 1 refills | Status: DC

## 2020-11-15 MED ORDER — PRAMIPEXOLE DIHYDROCHLORIDE ER 1.5 MG PO TB24: 1.5000 mg | ORAL_TABLET | Freq: Every day | ORAL | 1 refills | Status: DC

## 2020-11-15 NOTE — Patient Instructions (Signed)
Online Resources for Power over Parkinson's Group April 2022  . [place] Online Groups  o Power over Parkinson's Group :   - Power Over Parkinson's Patient Education Group will be Wednesday, April 13th at 2pm via Zoom.   - Upcoming Power over Parkinson's Meetings:  2nd Wednesdays of the month at 2 pm:  May 11th, June 8th - Contact Amy Marriott at [email or web address] if interested in participating in this online group o Parkinson's Care Partners Group:    3rd Mondays, Contact Misty Palladino o Atypical Parkinsonian Patient Group:   4th Wednesdays, Contact Misty Palladino o If you are interested in participating in these online groups with Misty, please contact her directly for how to join those meetings.  Her contact information is [email or web address]  She will send you a link to join the Zoom meeting.    . Parkinson Foundation:  www.[email or web address] o PD Health at Home continues:  Mindfulness Mondays, Expert Briefing Tuesdays, Wellness Wednesdays, Take Time Thursdays, Fitness Fridays -Listings for March 2022 are on the website o Upcoming Webinar:  Can We Put the Brakes on PD Progression?  Wednesday, April 6th @ 1 pm o Register for expert briefings (webinars) at [email or web address] o  Please check out their website to sign up for emails and see their full online offerings  . [employer] Foundation:  www.[email or web address]  o Upcoming Webinar:   New to Parkinson's?  Steps to Take Today.  Thursday, March 21st @ 12 noon o Check out additional information on their website to see their full online offerings  . [facility] Phinney Foundation:  www.[email or web address] o Upcoming Webinar:  Stay tuned o Care Partner Monthly Meetup.  With Connie Carpenter Phinney.  First Tuesday of each month, 2 pm o Check out additional information to Live Well Today on their website  . Parkinson and Movement Disorders (PMD) Alliance:  www.pmdalliance.org o [employer]:   Online Education Events o Sign up for emails, which are sent weekly to give you updates on programming and online offerings     . Parkinson's Association of [place]:  www.parkinsonassociation.org o Information on online support groups, education events, and online exercises including Yoga, Parkinson's exercises and more-LOTS of information on links to PD resources and online events o Virtual Support Group through Parkinson's Association of [place]; next one is scheduled for Wednesday, October 19, 2020 at 2 pm. (These are typically scheduled for the 1st Wednesday of the month at 2 pm).  Visit website for details.  . Additional links for movement activities: o PWR! Moves Classes at [facility] HAVE RESUMED!  Wednesdays 10 and 11 am.  Contact Amy Marriott, PT [email or web address] or [phone] if interested o Here is a link to the PWR!Moves classes on Zoom from [place] Parkinson's [facility] - Daily Mon-Sat at 10:00. Via Zoom, FREE and open to all.  There is also a link below via Facebook if you use that platform. - https://www.[email or web address]/mpf-programs/exercise-and-movement-activities - https://www.facebook.com/ParkinsonsMI.org/posts/pwr-moves-exercise-class-parkinson-wellness-recovery-online-with-angee-ludwa-pt-/[email or web address]/ o Parkinson's Wellness Recovery (PWR! Moves)  www.pwr4life.org - Info on the PWR! Virtual Experience:  You will have access to our expertise through self-assessment, guided plans that start with the PD-specific fundamentals, educational content, tips, Q&A with an expert, and a growing library of PD-specific pre-recorded and live exercise classes of varying types and intensity - both physical and cognitive! If that is not enough, we offer 1:1 wellness consultations (in-person or virtual) to personalize your PWR! Virtual Experience.  - Check out the PWR! Move of the month on the Parkinson Wellness Recovery website:     https://www.pwr4life.org/pwr-move-of-the-month-4/ o Parkinson Foundation Fitness Fridays:  - As part of the PD Health @ Home program, this free video series focuses each week on one aspect of fitness designed to support people living with Parkinson's.  These weekly videos highlight the Parkinson Foundation recent fitness guidelines for people with Parkinson's disease. -  www.parkinson.org/understanding-parkinsons/coronavirus/PD-health-at-home/Fitness-Fridays o Dance for PD website is offering free, live-stream classes throughout the week, as well as links to digital library of classes:  https://[email or web address].org/ o Dance for Parkinson's Class:  [facility] [place].  Free offering for people with Parkinson's and care partners; virtual class.  o For more information, contact [phone] or email Magalli Morana at [email or web address] o Virtual dance and Pilates for Parkinson's classes: Click on the Community Tab> Parkinson's Movement Initiative Tab.  To register for classes and for more information, visit www.americandancefestival.org and click the "community" tab.     o [employer] Parkinson's Cycling Classes  - [facility] [employer]: 1pm on Fridays-Live classes at [employer] (Contact Beth McKinney at [email or web address] or [phone]) - [facility] [employer]: Virtual Classes Mondays and Thursdays (contact Priscilla at [email or web address] or [phone])  o [place] [employer] - Three levels of classes are offered Tuesdays and Thursdays:  10:30 am,  12 noon & 1:45 pm at [facility].  - Active Stretching with Maria, New Class starting in March, on Fridays - To observe a class or for  more information, call [phone] or email [email or web address] . [facility]: o Online Caregiver Education Opportunities:  www.well-springsolutions.org/caregiver-education/caregiver-support-group.  You may also contact Jodi Kolada at  [email or web address] or [phone].   o [facility] Navigator:  Just1Navigator program, a free service to help individuals and families through the journey of determining care for older adults.  The "Navigator" is a social worker, Nicole Reynolds, who will speak with a prospective client and/or loved ones to provide an assessment of the situation and a set of recommendations for a personalized care plan -- all free of charge, and whether [facility] offers the needed service or not. If the need is not a service we provide, we are well-connected with reputable programs in town that we can refer you to.  www.well-springsolutions.org or to speak with the Navigator, call [phone].  

## 2020-11-21 ENCOUNTER — Telehealth: Payer: Self-pay | Admitting: Family Medicine

## 2020-11-21 ENCOUNTER — Other Ambulatory Visit: Payer: Self-pay

## 2020-11-21 MED ORDER — ATORVASTATIN CALCIUM 20 MG PO TABS: 20.0000 mg | ORAL_TABLET | Freq: Every day | ORAL | 3 refills | Status: DC

## 2020-11-21 NOTE — Telephone Encounter (Signed)
Pts spouse is calling in needing a refill on Rx atorvastatin (LIPITOR) 20 MG  Pharm:  [facility] account planner.

## 2020-11-21 NOTE — Telephone Encounter (Signed)
Pt Rx sent 

## 2020-12-14 HISTORY — PX: OTHER SURGICAL HISTORY: SHX169

## 2020-12-26 ENCOUNTER — Other Ambulatory Visit: Payer: Self-pay | Admitting: Family Medicine

## 2020-12-26 NOTE — Telephone Encounter (Signed)
Spoke with pt state that he has enough refills on his Tramadol, pt state that he has not been taking medication but just started back taking, advised pt to call the office when he has 1 week left

## 2020-12-27 ENCOUNTER — Ambulatory Visit (INDEPENDENT_AMBULATORY_CARE_PROVIDER_SITE_OTHER): Payer: Medicare Other

## 2020-12-27 ENCOUNTER — Other Ambulatory Visit: Payer: Self-pay

## 2020-12-27 DIAGNOSIS — Z0000 Encounter for general adult medical examination without abnormal findings: Secondary | ICD-10-CM

## 2020-12-27 NOTE — Patient Instructions (Signed)
[name] [name] , Thank you for taking time to come for your Medicare Wellness Visit. I appreciate your ongoing commitment to your health goals. Please review the following plan we discussed and let me know if I can assist you in the future.   Screening recommendations/referrals: Colonoscopy: 06/28/2013  due 2024 Recommended yearly ophthalmology/optometry visit for glaucoma screening and checkup Recommended yearly dental visit for hygiene and checkup  Vaccinations: Influenza vaccine: due fall 2022 Pneumococcal vaccine: completed series  Tdap vaccine: current  due 2028  Shingles vaccine: completed series     Advanced directives: will provide copies   Conditions/risks identified: none   Next appointment: none   Preventive Care 49 Years and Older, Male Preventive care refers to lifestyle choices and visits with your health care provider that can promote health and wellness. What does preventive care include? A yearly physical exam. This is also called an annual well check. Dental exams once or twice a year. Routine eye exams. Ask your health care provider how often you should have your eyes checked. Personal lifestyle choices, including: Daily care of your teeth and gums. Regular physical activity. Eating a healthy diet. Avoiding tobacco and drug use. Limiting alcohol use. Practicing safe sex. Taking low doses of aspirin every day. Taking vitamin and mineral supplements as recommended by your health care provider. What happens during an annual well check? The services and screenings done by your health care provider during your annual well check will depend on your age, overall health, lifestyle risk factors, and family history of disease. Counseling  Your health care provider may ask you questions about your: Alcohol use. Tobacco use. Drug use. Emotional well-being. Home and relationship well-being. Sexual activity. Eating habits. History of falls. Memory and ability to  understand (cognition). Work and work Statistician. Screening  You may have the following tests or measurements: Height, weight, and BMI. Blood pressure. Lipid and cholesterol levels. These may be checked every 5 years, or more frequently if you are over 33 years old. Skin check. Lung cancer screening. You may have this screening every year starting at age 8 if you have a 30-pack-year history of smoking and currently smoke or have quit within the past 15 years. Fecal occult blood test (FOBT) of the stool. You may have this test every year starting at age 78. Flexible sigmoidoscopy or colonoscopy. You may have a sigmoidoscopy every 5 years or a colonoscopy every 10 years starting at age 30. Prostate cancer screening. Recommendations will vary depending on your family history and other risks. Hepatitis C blood test. Hepatitis B blood test. Sexually transmitted disease (STD) testing. Diabetes screening. This is done by checking your blood sugar (glucose) after you have not eaten for a while (fasting). You may have this done every 1-3 years. Abdominal aortic aneurysm (AAA) screening. You may need this if you are a current or former smoker. Osteoporosis. You may be screened starting at age 42 if you are at high risk. Talk with your health care provider about your test results, treatment options, and if necessary, the need for more tests. Vaccines  Your health care provider may recommend certain vaccines, such as: Influenza vaccine. This is recommended every year. Tetanus, diphtheria, and acellular pertussis (Tdap, Td) vaccine. You may need a Td booster every 10 years. Zoster vaccine. You may need this after age 53. Pneumococcal 13-valent conjugate (PCV13) vaccine. One dose is recommended after age 27. Pneumococcal polysaccharide (PPSV23) vaccine. One dose is recommended after age 19. Talk to your health care provider  about which screenings and vaccines you need and how often you need them. This  information is not intended to replace advice given to you by your health care provider. Make sure you discuss any questions you have with your health care provider. Document Released: 07/29/2015 Document Revised: 03/21/2016 Document Reviewed: 05/03/2015 Elsevier Interactive Patient Education  2017 [place] Prevention in the Home Falls can cause injuries. They can happen to people of all ages. There are many things you can do to make your home safe and to help prevent falls. What can I do on the outside of my home? Regularly fix the edges of walkways and driveways and fix any cracks. Remove anything that might make you trip as you walk through a door, such as a raised step or threshold. Trim any bushes or trees on the path to your home. Use bright outdoor lighting. Clear any walking paths of anything that might make someone trip, such as rocks or tools. Regularly check to see if handrails are loose or broken. Make sure that both sides of any steps have handrails. Any raised decks and porches should have guardrails on the edges. Have any leaves, snow, or ice cleared regularly. Use sand or salt on walking paths during winter. Clean up any spills in your garage right away. This includes oil or grease spills. What can I do in the bathroom? Use night lights. Install grab bars by the toilet and in the tub and shower. Do not use towel bars as grab bars. Use non-skid mats or decals in the tub or shower. If you need to sit down in the shower, use a plastic, non-slip stool. Keep the floor dry. Clean up any water that spills on the floor as soon as it happens. Remove soap buildup in the tub or shower regularly. Attach bath mats securely with double-sided non-slip rug tape. Do not have throw rugs and other things on the floor that can make you trip. What can I do in the bedroom? Use night lights. Make sure that you have a light by your bed that is easy to reach. Do not use any sheets or  blankets that are too big for your bed. They should not hang down onto the floor. Have a firm chair that has side arms. You can use this for support while you get dressed. Do not have throw rugs and other things on the floor that can make you trip. What can I do in the kitchen? Clean up any spills right away. Avoid walking on wet floors. Keep items that you use a lot in easy-to-reach places. If you need to reach something above you, use a strong step stool that has a grab bar. Keep electrical cords out of the way. Do not use floor polish or wax that makes floors slippery. If you must use wax, use non-skid floor wax. Do not have throw rugs and other things on the floor that can make you trip. What can I do with my stairs? Do not leave any items on the stairs. Make sure that there are handrails on both sides of the stairs and use them. Fix handrails that are broken or loose. Make sure that handrails are as long as the stairways. Check any carpeting to make sure that it is firmly attached to the stairs. Fix any carpet that is loose or worn. Avoid having throw rugs at the top or bottom of the stairs. If you do have throw rugs, attach them to the floor  with carpet tape. Make sure that you have a light switch at the top of the stairs and the bottom of the stairs. If you do not have them, ask someone to add them for you. What else can I do to help prevent falls? Wear shoes that: Do not have high heels. Have rubber bottoms. Are comfortable and fit you well. Are closed at the toe. Do not wear sandals. If you use a stepladder: Make sure that it is fully opened. Do not climb a closed stepladder. Make sure that both sides of the stepladder are locked into place. Ask someone to hold it for you, if possible. Clearly mark and make sure that you can see: Any grab bars or handrails. First and last steps. Where the edge of each step is. Use tools that help you move around (mobility aids) if they are  needed. These include: Canes. Walkers. Scooters. Crutches. Turn on the lights when you go into a dark area. Replace any light bulbs as soon as they burn out. Set up your furniture so you have a clear path. Avoid moving your furniture around. If any of your floors are uneven, fix them. If there are any pets around you, be aware of where they are. Review your medicines with your doctor. Some medicines can make you feel dizzy. This can increase your chance of falling. Ask your doctor what other things that you can do to help prevent falls. This information is not intended to replace advice given to you by your health care provider. Make sure you discuss any questions you have with your health care provider. Document Released: 04/28/2009 Document Revised: 12/08/2015 Document Reviewed: 08/06/2014 Elsevier Interactive Patient Education  2017 [employer].

## 2020-12-27 NOTE — Progress Notes (Signed)
Subjective:   [name] [name] is a 70 y.o. male who presents for an Initial Medicare Annual Wellness Visit.  I connected with Branch Speeding today by telephone and verified that I am speaking with the correct person using two identifiers. Location patient: home Location provider: work Persons participating in the virtual visit: patient, provider.   I discussed the limitations, risks, security and privacy concerns of performing an evaluation and management service by telephone and the availability of in person appointments. I also discussed with the patient that there may be a patient responsible charge related to this service. The patient expressed understanding and verbally consented to this telephonic visit.    Interactive audio and video telecommunications were attempted between this provider and patient, however failed, due to patient having technical difficulties OR patient did not have access to video capability.  We continued and completed visit with audio only.    Review of Systems    N/a       Objective:    There were no vitals filed for this visit. There is no height or weight on file to calculate BMI.  Advanced Directives 08/11/2020 06/03/2020 12/30/2019 07/29/2019 06/01/2019 06/01/2019 05/28/2019  Does Patient Have a Medical Advance Directive? Yes Yes Yes Yes Yes Yes Yes  Type of Paramedic of [place];Living will - [place];Living will [place];Living will Living will;Healthcare Power of Attorney Living will;Healthcare Power of Attorney Living will;Healthcare Power of Attorney  Does patient want to make changes to medical advance directive? - - - - No - Patient declined No - Patient declined -  Copy of [place] in Chart? - - - - No - copy requested No - copy requested No - copy requested    Current Medications (verified) Outpatient Encounter Medications as of 12/27/2020  Medication  Sig   albuterol (VENTOLIN HFA) 108 (90 Base) MCG/ACT inhaler Inhale 2 puffs into the lungs every 4 (four) hours as needed for wheezing or shortness of breath.   atorvastatin (LIPITOR) 20 MG tablet Take 1 tablet (20 mg total) by mouth daily.   carbidopa-levodopa (SINEMET IR) 25-100 MG tablet Take 1 tablet by mouth 3 (three) times daily. 6am/10am/3pm   esomeprazole (NEXIUM) 40 MG capsule Take 1 capsule (40 mg total) by mouth daily.   finasteride (PROSCAR) 5 MG tablet Take 1 tablet (5 mg total) by mouth daily.   fluticasone (FLOVENT HFA) 110 MCG/ACT inhaler Inhale 2 puffs into the lungs 2 (two) times daily.   hydrochlorothiazide (MICROZIDE) 12.5 MG capsule Take 1 capsule (12.5 mg total) by mouth daily.   ondansetron (ZOFRAN) 8 MG tablet Take 1 tablet (8 mg total) by mouth every 8 (eight) hours as needed for nausea or vomiting.   polyethylene glycol (MIRALAX / GLYCOLAX) packet Take 8.5 g by mouth at bedtime.   potassium chloride (KLOR-CON) 10 MEQ tablet Take 1 tablet (10 mEq total) by mouth daily.   Pramipexole Dihydrochloride (MIRAPEX ER) 1.5 MG TB24 Take 1 tablet (1.5 mg total) by mouth daily.   tamsulosin (FLOMAX) 0.4 MG CAPS capsule TAKE 1 CAPSULE(0.4 MG) BY MOUTH DAILY   traMADol (ULTRAM) 50 MG tablet Take 2 tablets (100 mg total) by mouth every 6 (six) hours as needed. (Patient not taking: Reported on 11/15/2020)   No facility-administered encounter medications on file as of 12/27/2020.    Allergies (verified) Penicillins   History: Past Medical History:  Diagnosis Date   Arthritis    Barrett's esophagus  sees Dr. Delfin Edis    BPH with urinary obstruction    Complication of anesthesia    Urinary retention. "fights it"- is what he was told. 1990's - vomitted and aspirated in surgery   Diverticulosis    Esophageal dysmotility    GERD (gastroesophageal reflux disease)    not since [place] Fundopliation   History of hiatal hernia    History of kidney stones    15 in 2015- last one  was in Oct 2015- takes HCTZ to help prevent   Hyperlipidemia    Insomnia    Lyme disease 2013   Parkinson's disease ([place])    sees Dr. Wells Guiles Tat    PONV (postoperative nausea and vomiting)    [facility] spotted fever) 2012   Past Surgical History:  Procedure Laterality Date   CHOLECYSTECTOMY     COLONOSCOPY  06-24-13   per Dr. Olevia Perches, no polyps, diveticulosis only, repeat in 10 yrs    ESOPHAGOGASTRODUODENOSCOPY  06-24-13   per Dr. Olevia Perches, no signs of Barretts, repeat in 5 yrs    HERNIA REPAIR Right    right inguinal   LASIK     bilateral   lipomas removed     x10/ on arms and legs   NISSEN FUNDOPLICATION  3-76-28   laparoscopic per Dr. Johnathan Hausen   ORCHIOPEXY     right side at age 54    [place] Left 05/26/2018   Procedure: [place];  Surgeon: Dorna Leitz, MD;  Location: [facility];  Service: Orthopedics;  Laterality: Left;   TOTAL KNEE ARTHROPLASTY Right 06/01/2019   Procedure: RIGHT TOTAL KNEE ARTHROPLASTY;  Surgeon: Frederik Pear, MD;  Location: [facility];  Service: Orthopedics;  Laterality: Right;   TOTAL SHOULDER ARTHROPLASTY Right 06/23/2015   Procedure: RIGHT TOTAL SHOULDER ARTHROPLASTY;  Surgeon: Justice Britain, MD;  Location: [place];  Service: Orthopedics;  Laterality: Right;   Family History  Problem Relation Age of Onset   Arthritis Mother    Cancer Mother    Esophageal cancer Father    Lung cancer Maternal Grandfather    Esophageal cancer Paternal Grandmother    Lung cancer Paternal Grandfather    Colon cancer Sister        questionable   Hypertension Brother        over [employer]   Healthy Daughter    Esophageal cancer Sister    Healthy Daughter    Esophageal cancer Paternal Aunt        x 2   Esophageal cancer Cousin    Rectal cancer Neg Hx    Stomach cancer Neg Hx    Social History   Socioeconomic History   Marital status: Married    Spouse name: Not on file    Number of children: 2   Years of education: Not on file   Highest education level: Master's degree (e.g., MA, MS, MEng, MEd, MSW, MBA)  Occupational History   Not on file  Tobacco Use   Smoking status: Never   Smokeless tobacco: Never  Vaping Use   Vaping Use: Never used  Substance and Sexual Activity   Alcohol use: No    Alcohol/week: 0.0 standard drinks   Drug use: No   Sexual activity: Not on file  Other Topics Concern   Not on file  Social History Narrative   Right handed   4 story home   Drinks caffeine   Social Determinants of Health  Financial Resource Strain: Not on file  Food Insecurity: Not on file  Transportation Needs: Not on file  Physical Activity: Not on file  Stress: Not on file  Social Connections: Not on file    Tobacco Counseling Counseling given: Not Answered   Clinical Intake:                 Diabetic?no         Activities of Daily Living No flowsheet data found.  Patient Care Team: [name], MD as PCP - General  Indicate any recent Medical Services you may have received from other than [facility] providers in the past year (date may be approximate).     Assessment:   This is a routine wellness examination for [place].  Hearing/Vision screen No results found.  Dietary issues and exercise activities discussed:     Goals Addressed   None    Depression Screen PHQ 2/9 Scores 12/30/2019 01/23/2019 09/10/2017  PHQ - 2 Score 0 0 0    Fall Risk Fall Risk  08/11/2020 06/03/2020 12/30/2019 07/29/2019 06/05/2019  Falls in the past year? 1 1 0 0 0  Comment - - - - [place] Telephone Survey: data to providers prior to load  Number falls in past yr: 0 0 0 0 -  Injury with Fall? 0 1 0 0 -  Follow up - - - - -    FALL RISK PREVENTION PERTAINING TO THE HOME:  Any stairs in or around the home? Yes  If so, are there any without handrails? No  Home free of loose throw rugs in walkways, pet beds, electrical cords, etc? Yes   Adequate lighting in your home to reduce risk of falls? Yes   ASSISTIVE DEVICES UTILIZED TO PREVENT FALLS:  Life alert? No  Use of a cane, walker or w/c? No  Grab bars in the bathroom? Yes  Shower chair or bench in shower? Yes  Elevated toilet seat or a handicapped toilet? Yes   Cognitive Function:      Normal cognitive status assessed by direct observation by this Nurse Health Advisor. No abnormalities found.    Immunizations Immunization History  Administered Date(s) Administered   Fluad Quad(high Dose 65+) 03/17/2019   Influenza Split 04/09/2013, 03/17/2017   Influenza Whole 04/20/2008, 04/18/2009, 03/03/2012   Influenza, High Dose Seasonal PF 03/19/2017   Influenza, Seasonal, Injecte, Preservative Fre 03/17/2019   Influenza,inj,quad, With Preservative 02/27/2017   Influenza-Unspecified 03/18/2014, 02/02/2015, 02/22/2016, 02/13/2018   Pneumococcal Conjugate-13 01/02/2017   Pneumococcal Polysaccharide-23 04/18/2009   Td 01/23/2006   Tdap 01/02/2017   Zoster, Live 05/27/2012    TDAP status: Up to date  Flu Vaccine status: Up to date  Pneumococcal vaccine status: Up to date  Covid-19 vaccine status: Completed vaccines  Qualifies for Shingles Vaccine? Yes   Zostavax completed No   Shingrix Completed?: Yes  Screening Tests Health Maintenance  Topic Date Due   COVID-19 Vaccine (1) Never done   Hepatitis C Screening  Never done   Zoster Vaccines- Shingrix (1 of 2) Never done   PNA vac Low Risk Adult (2 of 2 - PPSV23) 01/02/2018   INFLUENZA VACCINE  02/13/2021   COLONOSCOPY (Pts 45-64yrs Insurance coverage will need to be confirmed)  06/25/2023   TETANUS/TDAP  01/03/2027   HPV VACCINES  Aged Out    Health Maintenance  Health Maintenance Due  Topic Date Due   COVID-19 Vaccine (1) Never done   Hepatitis C Screening  Never done   Zoster  Vaccines- Shingrix (1 of 2) Never done   PNA vac Low Risk Adult (2 of 2 - PPSV23) 01/02/2018    Colorectal cancer  screening: Type of screening: Colonoscopy. Completed 06/28/2013. Repeat every 10 years  Lung Cancer Screening: (Low Dose CT Chest recommended if Age 78-80 years, 30 pack-year currently smoking OR have quit w/in 15years.) does not qualify.   Lung Cancer Screening Referral: n/a  Additional Screening:  Hepatitis C Screening: does qualify  Vision Screening: Recommended annual ophthalmology exams for early detection of glaucoma and other disorders of the eye. Is the patient up to date with their annual eye exam?  Yes  Who is the provider or what is the name of the office in which the patient attends annual eye exams? [facility]  If pt is not established with a provider, would they like to be referred to a provider to establish care? No .   Dental Screening: Recommended annual dental exams for proper oral hygiene  Community Resource Referral / Chronic Care Management: CRR required this visit?  No   CCM required this visit?  No      Plan:     I have personally reviewed and noted the following in the patient's chart:   Medical and social history Use of alcohol, tobacco or illicit drugs  Current medications and supplements including opioid prescriptions. Patient is currently taking opioid prescriptions. Information provided to patient regarding non-opioid alternatives. Patient advised to discuss non-opioid treatment plan with their provider. Functional ability and status Nutritional status Physical activity Advanced directives List of other physicians Hospitalizations, surgeries, and ER visits in previous 12 months Vitals Screenings to include cognitive, depression, and falls Referrals and appointments  In addition, I have reviewed and discussed with patient certain preventive protocols, quality metrics, and best practice recommendations. A written personalized care plan for preventive services as well as general preventive health recommendations were provided to patient.      Randel Pigg, LPN   9/48/0165   Nurse Notes: none

## 2020-12-30 ENCOUNTER — Telehealth: Payer: Self-pay | Admitting: Family Medicine

## 2020-12-30 ENCOUNTER — Other Ambulatory Visit: Payer: Self-pay

## 2020-12-30 MED ORDER — FLUTICASONE PROPIONATE HFA 110 MCG/ACT IN AERO: 2.0000 | INHALATION_SPRAY | Freq: Two times a day (BID) | RESPIRATORY_TRACT | 1 refills | Status: DC

## 2020-12-30 MED ORDER — ALBUTEROL SULFATE HFA 108 (90 BASE) MCG/ACT IN AERS: 2.0000 | INHALATION_SPRAY | RESPIRATORY_TRACT | 1 refills | Status: DC | PRN

## 2020-12-30 NOTE — Telephone Encounter (Signed)
Patient needs his 2 inhalers sent to [facility].    albuterol (VENTOLIN HFA) 108 (90 Base) MCG/ACT inhaler   fluticasone (FLOVENT HFA) 110 MCG/ACT inhaler  [place], [place] Phone:  [phone]  Fax:  [phone]

## 2020-12-30 NOTE — Telephone Encounter (Signed)
[facility], left message with pt wife

## 2021-01-17 ENCOUNTER — Other Ambulatory Visit: Payer: Self-pay

## 2021-01-17 ENCOUNTER — Telehealth: Payer: Self-pay | Admitting: Family Medicine

## 2021-01-17 MED ORDER — FINASTERIDE 5 MG PO TABS: 5.0000 mg | ORAL_TABLET | Freq: Every day | ORAL | 3 refills | Status: DC

## 2021-01-17 NOTE — Telephone Encounter (Signed)
Pt is calling in needing a refill on Rx finasteride (PROSCAR) 5 MG  Pharm:  [facility]

## 2021-01-17 NOTE — Telephone Encounter (Signed)
Rx sent per pt request 

## 2021-02-05 ENCOUNTER — Other Ambulatory Visit: Payer: Self-pay | Admitting: Family Medicine

## 2021-02-06 ENCOUNTER — Other Ambulatory Visit: Payer: Self-pay | Admitting: Family Medicine

## 2021-02-08 ENCOUNTER — Other Ambulatory Visit: Payer: Self-pay | Admitting: Family Medicine

## 2021-02-23 ENCOUNTER — Other Ambulatory Visit: Payer: Self-pay

## 2021-02-24 ENCOUNTER — Ambulatory Visit (INDEPENDENT_AMBULATORY_CARE_PROVIDER_SITE_OTHER): Payer: Medicare Other | Admitting: Family Medicine

## 2021-02-24 ENCOUNTER — Encounter: Payer: Self-pay | Admitting: Family Medicine

## 2021-02-24 VITALS — BP 120/80 | HR 69 | Temp 98.5°F | Ht 71.5 in | Wt 227.1 lb

## 2021-02-24 DIAGNOSIS — M5416 Radiculopathy, lumbar region: Secondary | ICD-10-CM | POA: Diagnosis not present

## 2021-02-24 DIAGNOSIS — R600 Localized edema: Secondary | ICD-10-CM | POA: Diagnosis not present

## 2021-02-24 DIAGNOSIS — M8949 Other hypertrophic osteoarthropathy, multiple sites: Secondary | ICD-10-CM

## 2021-02-24 DIAGNOSIS — N138 Other obstructive and reflux uropathy: Secondary | ICD-10-CM | POA: Diagnosis not present

## 2021-02-24 DIAGNOSIS — E039 Hypothyroidism, unspecified: Secondary | ICD-10-CM

## 2021-02-24 DIAGNOSIS — G4700 Insomnia, unspecified: Secondary | ICD-10-CM

## 2021-02-24 DIAGNOSIS — K219 Gastro-esophageal reflux disease without esophagitis: Secondary | ICD-10-CM | POA: Diagnosis not present

## 2021-02-24 DIAGNOSIS — M159 Polyosteoarthritis, unspecified: Secondary | ICD-10-CM

## 2021-02-24 DIAGNOSIS — E785 Hyperlipidemia, unspecified: Secondary | ICD-10-CM

## 2021-02-24 DIAGNOSIS — E1065 Type 1 diabetes mellitus with hyperglycemia: Secondary | ICD-10-CM

## 2021-02-24 DIAGNOSIS — N401 Enlarged prostate with lower urinary tract symptoms: Secondary | ICD-10-CM

## 2021-02-24 DIAGNOSIS — G20 Parkinson's disease: Secondary | ICD-10-CM

## 2021-02-24 LAB — BASIC METABOLIC PANEL
BUN: 18 mg/dL (ref 6–23)
CO2: 24 mEq/L (ref 19–32)
Calcium: 8.9 mg/dL (ref 8.4–10.5)
Chloride: 101 mEq/L (ref 96–112)
Creatinine, Ser: 0.85 mg/dL (ref 0.40–1.50)
GFR: 88.79 mL/min (ref >60.00)
Glucose, Bld: 101 mg/dL — ABNORMAL HIGH (ref 70–99)
Potassium: 3.4 mEq/L — ABNORMAL LOW (ref 3.5–5.1)
Sodium: 139 mEq/L (ref 135–145)

## 2021-02-24 LAB — CBC WITH DIFFERENTIAL/PLATELET
Basophils Absolute: 0 10*3/uL (ref 0.0–0.1)
Basophils Relative: 0.7 % (ref 0.0–3.0)
Eosinophils Absolute: 0 10*3/uL (ref 0.0–0.7)
Eosinophils Relative: 0.4 % (ref 0.0–5.0)
HCT: 43.9 % (ref 39.0–52.0)
Hemoglobin: 15.2 g/dL (ref 13.0–17.0)
Lymphocytes Relative: 15.7 % (ref 12.0–46.0)
Lymphs Abs: 1.1 10*3/uL (ref 0.7–4.0)
MCHC: 34.6 g/dL (ref 30.0–36.0)
MCV: 90.5 fl (ref 78.0–100.0)
Monocytes Absolute: 0.5 10*3/uL (ref 0.1–1.0)
Monocytes Relative: 6.8 % (ref 3.0–12.0)
Neutro Abs: 5.3 10*3/uL (ref 1.4–7.7)
Neutrophils Relative %: 76.4 % (ref 43.0–77.0)
Platelets: 198 10*3/uL (ref 150.0–400.0)
RBC: 4.85 Mil/uL (ref 4.22–5.81)
RDW: 14 % (ref 11.5–15.5)
WBC: 7 10*3/uL (ref 4.0–10.5)

## 2021-02-24 LAB — HEPATIC FUNCTION PANEL
ALT: 7 U/L (ref 0–53)
AST: 18 U/L (ref 0–37)
Albumin: 4.3 g/dL (ref 3.5–5.2)
Alkaline Phosphatase: 89 U/L (ref 39–117)
Bilirubin, Direct: 0.5 mg/dL — ABNORMAL HIGH (ref 0.0–0.3)
Total Bilirubin: 4 mg/dL — ABNORMAL HIGH (ref 0.2–1.2)
Total Protein: 6.6 g/dL (ref 6.0–8.3)

## 2021-02-24 LAB — LIPID PANEL
Cholesterol: 146 mg/dL (ref 0–200)
HDL: 40.6 mg/dL (ref >39.00)
LDL Cholesterol: 68 mg/dL (ref 0–99)
NonHDL: 105.54
Total CHOL/HDL Ratio: 4
Triglycerides: 186 mg/dL — ABNORMAL HIGH (ref 0.0–149.0)
VLDL: 37.2 mg/dL (ref 0.0–40.0)

## 2021-02-24 LAB — T4, FREE: Free T4: 0.87 ng/dL (ref 0.60–1.60)

## 2021-02-24 LAB — HEMOGLOBIN A1C: Hgb A1c MFr Bld: 5.9 % (ref 4.6–6.5)

## 2021-02-24 LAB — TSH: TSH: 1.88 u[IU]/mL (ref 0.35–5.50)

## 2021-02-24 LAB — T3, FREE: T3, Free: 3.6 pg/mL (ref 2.3–4.2)

## 2021-02-24 LAB — PSA: PSA: 0.71 ng/mL (ref 0.10–4.00)

## 2021-02-24 MED ORDER — MELOXICAM 15 MG PO TABS: 15.0000 mg | ORAL_TABLET | Freq: Every day | ORAL | 2 refills | Status: DC

## 2021-02-24 NOTE — Progress Notes (Signed)
Subjective:    Patient ID: [name] [name], male    DOB: Jan 08, 1951, 70 y.o.   MRN: [id number]  HPI Here to follow up on issues. His arthritis has been bothering him more, and it affects all his joints. His BPH has responded well the combination of Finasteride and Tamsulosin. The swelling in his legs has been minimal. His Parkinsons disease has been remarkably stable since his diagnosis 8 years ago. He attributes this to his regular exercise routines.    Review of Systems  Constitutional: Negative.   HENT: Negative.    Eyes: Negative.   Respiratory: Negative.    Cardiovascular: Negative.   Gastrointestinal: Negative.   Genitourinary: Negative.   Musculoskeletal:  Positive for arthralgias and back pain.  Skin: Negative.   Neurological: Negative.   Psychiatric/Behavioral: Negative.        Objective:   Physical Exam Constitutional:      General: He is not in acute distress.    Appearance: Normal appearance. He is well-developed. He is not diaphoretic.  HENT:     Head: Normocephalic and atraumatic.     Right Ear: External ear normal.     Left Ear: External ear normal.     Nose: Nose normal.     Mouth/Throat:     Pharynx: No oropharyngeal exudate.  Eyes:     General: No scleral icterus.       Right eye: No discharge.        Left eye: No discharge.     Conjunctiva/sclera: Conjunctivae normal.     Pupils: Pupils are equal, round, and reactive to light.  Neck:     Thyroid: No thyromegaly.     Vascular: No JVD.     Trachea: No tracheal deviation.  Cardiovascular:     Rate and Rhythm: Normal rate and regular rhythm.     Heart sounds: Normal heart sounds. No murmur heard.   No friction rub. No gallop.  Pulmonary:     Effort: Pulmonary effort is normal. No respiratory distress.     Breath sounds: Normal breath sounds. No wheezing or rales.  Chest:     Chest wall: No tenderness.  Abdominal:     General: Bowel sounds are normal. There is no distension.     Palpations:  Abdomen is soft. There is no mass.     Tenderness: There is no abdominal tenderness. There is no guarding or rebound.  Genitourinary:    Penis: Normal. No tenderness.      Prostate: Normal.     Rectum: Normal. Guaiac result negative.  Musculoskeletal:        General: No tenderness. Normal range of motion.     Cervical back: Neck supple.     Comments: 1+ edema in both lower legs   Lymphadenopathy:     Cervical: No cervical adenopathy.  Skin:    General: Skin is warm and dry.     Coloration: Skin is not pale.     Findings: No erythema or rash.  Neurological:     Mental Status: He is alert and oriented to person, place, and time.     Cranial Nerves: No cranial nerve deficit.     Motor: No abnormal muscle tone.     Coordination: Coordination normal.     Deep Tendon Reflexes: Reflexes are normal and symmetric. Reflexes normal.     Comments: Resting tremor in the right hand   Psychiatric:        Behavior: Behavior normal.  Thought Content: Thought content normal.        Judgment: Judgment normal.          Assessment & Plan:  His Parkinsons and GERD and BPH are stable. We will get fasting labs to check lipids, PSA, etc. For the OA, he will try Meloxicam 15 mg daily.  We spent 35 minutes reviewing records and discussing these issues.  [name], MD

## 2021-02-24 NOTE — Addendum Note (Signed)
Addended by: Amanda Cockayne on: 02/24/2021 12:05 PM   Modules accepted: Orders

## 2021-02-27 ENCOUNTER — Other Ambulatory Visit: Payer: Self-pay

## 2021-02-27 MED ORDER — POTASSIUM CHLORIDE ER 10 MEQ PO TBCR: 10.0000 meq | EXTENDED_RELEASE_TABLET | Freq: Two times a day (BID) | ORAL | 3 refills | Status: DC

## 2021-03-07 ENCOUNTER — Telehealth: Payer: Self-pay

## 2021-03-07 NOTE — Telephone Encounter (Signed)
Wife of patient called asking if patient can take  meloxicam (MOBIC) 15 MG tablet while taking traMADol (ULTRAM) 50 MG tablet

## 2021-03-08 NOTE — Telephone Encounter (Signed)
Please advise if okay to take both medications .

## 2021-03-09 NOTE — Telephone Encounter (Signed)
Spoke with patient, informed of message

## 2021-03-09 NOTE — Telephone Encounter (Signed)
Yes he should take both of them together

## 2021-03-21 ENCOUNTER — Telehealth: Payer: Self-pay | Admitting: Family Medicine

## 2021-03-21 NOTE — Telephone Encounter (Signed)
I called spouse Jackelyn Poling to schedule her awv.    She declined stating patient [name] [name] had awv done 12/27/20.  She stated he was charged $141 after insurance.  Dawn  Can you look at this for patient?  Why was he charged for AWV  Thanks Robin

## 2021-03-22 NOTE — Telephone Encounter (Signed)
Dawn When I scheduled appointment, I scheduled awvi.  When I checked [facility] it stated patient could have AWV-I per [facility] 10/14/17.

## 2021-03-28 ENCOUNTER — Encounter: Payer: Self-pay | Admitting: Family Medicine

## 2021-03-28 ENCOUNTER — Telehealth (INDEPENDENT_AMBULATORY_CARE_PROVIDER_SITE_OTHER): Payer: Medicare Other | Admitting: Family Medicine

## 2021-03-28 DIAGNOSIS — J4540 Moderate persistent asthma, uncomplicated: Secondary | ICD-10-CM

## 2021-03-28 DIAGNOSIS — J45909 Unspecified asthma, uncomplicated: Secondary | ICD-10-CM | POA: Insufficient documentation

## 2021-03-28 DIAGNOSIS — J3089 Other allergic rhinitis: Secondary | ICD-10-CM | POA: Diagnosis not present

## 2021-03-28 MED ORDER — LORATADINE 10 MG PO TABS: 10.0000 mg | ORAL_TABLET | Freq: Every day | ORAL | 11 refills | Status: DC

## 2021-03-28 MED ORDER — ADVAIR HFA 115-21 MCG/ACT IN AERO: 2.0000 | INHALATION_SPRAY | Freq: Two times a day (BID) | RESPIRATORY_TRACT | 12 refills | Status: DC

## 2021-03-28 NOTE — Progress Notes (Signed)
   Subjective:    Patient ID: [name] [name], male    DOB: 1951/02/23, 70 y.o.   MRN: [id number]  HPI Virtual Visit via Telephone Note  I connected with the patient on 03/28/21 at  2:45 PM EDT by telephone and verified that I am speaking with the correct person using two identifiers.   I discussed the limitations, risks, security and privacy concerns of performing an evaluation and management service by telephone and the availability of in person appointments. I also discussed with the patient that there may be a patient responsible charge related to this service. The patient expressed understanding and agreed to proceed.  Location patient: home Location provider: work or home office Participants present for the call: patient, provider Patient did not have a visit in the prior 7 days to address this/these issue(s).   History of Present Illness: Here to discuss his allergies and asthma. He has been using Flovent as his maintenance inhaler and then Ventolin for rescue. He did well until several weeks ago when his fall allergies cropped up. These have cause itchy eyes and runny nose. At the same time his asthma has been flaring up, and he has had to use his rescue inhaler once or twice a day.    Observations/Objective: Patient sounds cheerful and well on the phone. I do not appreciate any SOB. Speech and thought processing are grossly intact. Patient reported vitals:  Assessment and Plan: I told him it is not surprising that his allergies and asthma have worsened together. He will start taking Claritin 10 mg daily. Also we will stop Flovent and begin using Advair HFA twice daily as his maintenance inhaler. Follow up in 3-4 weeks.  [name], MD   Follow Up Instructions:     [phone] 5-10 [phone] 11-20 [id number] 21-30 I did not refer this patient for an OV in the next 24 hours for this/these issue(s).  I discussed the assessment and treatment plan with the patient. The patient was  provided an opportunity to ask questions and all were answered. The patient agreed with the plan and demonstrated an understanding of the instructions.   The patient was advised to call back or seek an in-person evaluation if the symptoms worsen or if the condition fails to improve as anticipated.  I provided 24 minutes of non-face-to-face time during this encounter.   [name], MD     Review of Systems     Objective:   Physical Exam        Assessment & Plan:

## 2021-04-22 DIAGNOSIS — Z471 Aftercare following joint replacement surgery: Secondary | ICD-10-CM | POA: Diagnosis not present

## 2021-04-22 DIAGNOSIS — Z96651 Presence of right artificial knee joint: Secondary | ICD-10-CM | POA: Diagnosis not present

## 2021-04-28 ENCOUNTER — Other Ambulatory Visit: Payer: Self-pay

## 2021-04-28 ENCOUNTER — Telehealth: Payer: Self-pay | Admitting: Family Medicine

## 2021-04-28 MED ORDER — HYDROCHLOROTHIAZIDE 12.5 MG PO CAPS: 12.5000 mg | ORAL_CAPSULE | Freq: Every day | ORAL | 1 refills | Status: DC

## 2021-04-28 NOTE — Telephone Encounter (Signed)
Patient's wife called to get refill on hydrochlorothiazide (MICROZIDE) 12.5 MG capsule     Please send to  [place], [place] Phone:  [phone]  Fax:  [phone]             Good callback number is [phone]      Please advise

## 2021-04-28 NOTE — Telephone Encounter (Signed)
Requested refill for HCTZ sent to [facility].  Called patient to make aware

## 2021-05-16 NOTE — Progress Notes (Addendum)
Assessment/Plan:   1.  Parkinsons Disease  -Continue pramipexole ER, 1.5 mg daily.  He has had no compulsive behaviors or sleep attacks.  He is tolerating it well.  -We will continue carbidopa/levodopa 25/100, 1 tablet at 6 AM/10 AM/3 PM  -We discussed that it used to be thought that levodopa would increase risk of melanoma but now it is believed that Parkinsons itself likely increases risk of melanoma. he is to get regular skin checks.  He follows with derm.    2.  Chronic low back pain  -Status post surgical intervention.  Now off of tramadol.  Last filled in April, 2022 via PDMP. Subjective:   [name] [name] was seen today in follow up for Parkinsons disease.  My previous records were reviewed prior to todays visit as well as outside records available to me. Pt denies falls.  Pt denies lightheadedness, near syncope.  No hallucinations.  Mood has been good.  Having trouble with asthma and vertigo, both chronic and intermittent.  Goes to gym 3 days per week in addition to the bike.  No cramping of feet/legs at bed/q hs.    Current prescribed movement disorder medications: Pramipexole ER, 1.5 mg daily Carbidopa/levodopa 25/100, 1 tablet 3 times per day.   PREVIOUS MEDICATIONS: Sinemet and Mirapex  ALLERGIES:   Allergies  Allergen Reactions   Penicillins Rash and Other (See Comments)    Did it involve swelling of the face/tongue/throat, SOB, or low BP? No Did it involve sudden or severe rash/hives, skin peeling, or any reaction on the inside of your mouth or nose? No Did you need to seek medical attention at a hospital or doctor's office? No When did it last happen?      childhood allergy If all above answers are "NO", may proceed with cephalosporin use.      CURRENT MEDICATIONS:  Outpatient Encounter Medications as of 05/18/2021  Medication Sig   albuterol (VENTOLIN HFA) 108 (90 Base) MCG/ACT inhaler Inhale 2 puffs into the lungs every 4 (four) hours as needed for  wheezing or shortness of breath.   atorvastatin (LIPITOR) 20 MG tablet Take 1 tablet (20 mg total) by mouth daily.   carbidopa-levodopa (SINEMET IR) 25-100 MG tablet Take 1 tablet by mouth 3 (three) times daily. 6am/10am/3pm   esomeprazole (NEXIUM) 40 MG capsule Take 1 capsule (40 mg total) by mouth daily.   finasteride (PROSCAR) 5 MG tablet Take 1 tablet (5 mg total) by mouth daily.   fluticasone-salmeterol (ADVAIR HFA) 115-21 MCG/ACT inhaler Inhale 2 puffs into the lungs 2 (two) times daily.   hydrochlorothiazide (MICROZIDE) 12.5 MG capsule Take 1 capsule (12.5 mg total) by mouth daily.   loratadine (CLARITIN) 10 MG tablet Take 1 tablet (10 mg total) by mouth daily.   ondansetron (ZOFRAN) 8 MG tablet Take 1 tablet (8 mg total) by mouth every 8 (eight) hours as needed for nausea or vomiting.   polyethylene glycol (MIRALAX / GLYCOLAX) packet Take 8.5 g by mouth at bedtime.   potassium chloride (KLOR-CON) 10 MEQ tablet Take 1 tablet (10 mEq total) by mouth 2 (two) times daily.   Pramipexole Dihydrochloride (MIRAPEX ER) 1.5 MG TB24 Take 1 tablet (1.5 mg total) by mouth daily.   tamsulosin (FLOMAX) 0.4 MG CAPS capsule TAKE 1 CAPSULE(0.4 MG) BY MOUTH DAILY   traMADol (ULTRAM) 50 MG tablet Take 2 tablets (100 mg total) by mouth every 6 (six) hours as needed.   No facility-administered encounter medications on file as of 05/18/2021.  Objective:   PHYSICAL EXAMINATION:    VITALS:   There were no vitals filed for this visit.   GEN:  The patient appears stated age and is in NAD. HEENT:  Normocephalic, atraumatic.  The mucous membranes are moist. The superficial temporal arteries are without ropiness or tenderness. CV:  RRR Lungs:  CTAB Neck/HEME:  There are no carotid bruits bilaterally.  Neurological examination:  Orientation: The patient is alert and oriented x3. Cranial nerves: There is good facial symmetry with min facial hypomimia. The speech is fluent and clear. Soft palate rises  symmetrically and there is no tongue deviation. Hearing is intact to conversational tone. Sensation: Sensation is intact to light touch throughout Motor: Strength is at least antigravity x4.  Movement examination: Tone: There is mild increased tone in the RUE Abnormal movements: there is occ RUE rest tremor Coordination:  There is min decremation with RAM's, only with hand opening and closing on the right. Gait and Station: The patient has no difficulty arising out of a deep-seated chair without the use of the hands. The patient's stride length is good.  He is forward flexed at the waist.    I have reviewed and interpreted the following labs independently    Chemistry      Component Value Date/Time   NA 139 02/24/2021 1206   K 3.4 (L) 02/24/2021 1206   CL 101 02/24/2021 1206   CO2 24 02/24/2021 1206   BUN 18 02/24/2021 1206   CREATININE 0.85 02/24/2021 1206   CREATININE 0.65 (L) 02/24/2020 1208      Component Value Date/Time   CALCIUM 8.9 02/24/2021 1206   ALKPHOS 89 02/24/2021 1206   AST 18 02/24/2021 1206   ALT 7 02/24/2021 1206   BILITOT 4.0 (H) 02/24/2021 1206       Lab Results  Component Value Date   WBC 7.0 02/24/2021   HGB 15.2 02/24/2021   HCT 43.9 02/24/2021   MCV 90.5 02/24/2021   PLT 198.0 02/24/2021    Lab Results  Component Value Date   TSH 1.88 02/24/2021     Total time spent on today's visit was 54minutes, including both face-to-face time and nonface-to-face time.  Time included that spent on review of records (prior notes available to me/labs/imaging if pertinent), discussing treatment and goals, answering patient's questions and coordinating care.  Cc:  [name], MD

## 2021-05-18 ENCOUNTER — Other Ambulatory Visit: Payer: Self-pay

## 2021-05-18 ENCOUNTER — Ambulatory Visit (INDEPENDENT_AMBULATORY_CARE_PROVIDER_SITE_OTHER): Payer: Medicare Other | Admitting: Neurology

## 2021-05-18 ENCOUNTER — Encounter: Payer: Self-pay | Admitting: Neurology

## 2021-05-18 VITALS — BP 135/82 | HR 89 | Ht 71.0 in | Wt 235.4 lb

## 2021-05-18 DIAGNOSIS — G20 Parkinson's disease: Secondary | ICD-10-CM | POA: Diagnosis not present

## 2021-05-18 MED ORDER — CARBIDOPA-LEVODOPA 25-100 MG PO TABS: 1.0000 | ORAL_TABLET | Freq: Three times a day (TID) | ORAL | 1 refills | Status: DC

## 2021-05-18 MED ORDER — PRAMIPEXOLE DIHYDROCHLORIDE ER 1.5 MG PO TB24: 1.5000 mg | ORAL_TABLET | Freq: Every day | ORAL | 1 refills | Status: DC

## 2021-05-19 ENCOUNTER — Other Ambulatory Visit: Payer: Self-pay

## 2021-05-19 ENCOUNTER — Telehealth: Payer: Self-pay | Admitting: Neurology

## 2021-05-19 NOTE — Telephone Encounter (Signed)
Tramadol Is removed from his medication list he is not taking it . It was prescribed but he doesn't like the way it makes him feel. He let me know he has had the same bottle since April and he wants to let Dr. Carles Collet know that he is not taking it do to his appointment he said Dr. Carles Collet asked a few times about the medication and when he got home he realized the amount that was prescribed to him

## 2021-05-19 NOTE — Telephone Encounter (Signed)
Pt called in stating the amount of Tramadol his paperwork stated he had been taking was incorrect. He has not taken that much and wants to speak with someone.

## 2021-09-11 ENCOUNTER — Other Ambulatory Visit: Payer: Self-pay

## 2021-09-11 ENCOUNTER — Telehealth: Payer: Self-pay | Admitting: Family Medicine

## 2021-09-11 DIAGNOSIS — J4540 Moderate persistent asthma, uncomplicated: Secondary | ICD-10-CM

## 2021-09-11 MED ORDER — ALBUTEROL SULFATE HFA 108 (90 BASE) MCG/ACT IN AERS: 2.0000 | INHALATION_SPRAY | RESPIRATORY_TRACT | 1 refills | Status: AC | PRN

## 2021-09-11 NOTE — Telephone Encounter (Signed)
Pt needs a refill for Albuterol inhaler called in to [facility].

## 2021-09-11 NOTE — Telephone Encounter (Signed)
Refill sent to [facility].  

## 2021-10-16 DIAGNOSIS — L578 Other skin changes due to chronic exposure to nonionizing radiation: Secondary | ICD-10-CM | POA: Diagnosis not present

## 2021-10-16 DIAGNOSIS — L821 Other seborrheic keratosis: Secondary | ICD-10-CM | POA: Diagnosis not present

## 2021-10-16 DIAGNOSIS — D225 Melanocytic nevi of trunk: Secondary | ICD-10-CM | POA: Diagnosis not present

## 2021-10-16 DIAGNOSIS — L820 Inflamed seborrheic keratosis: Secondary | ICD-10-CM | POA: Diagnosis not present

## 2021-10-16 DIAGNOSIS — D485 Neoplasm of uncertain behavior of skin: Secondary | ICD-10-CM | POA: Diagnosis not present

## 2021-10-16 DIAGNOSIS — D1801 Hemangioma of skin and subcutaneous tissue: Secondary | ICD-10-CM | POA: Diagnosis not present

## 2021-10-16 DIAGNOSIS — L570 Actinic keratosis: Secondary | ICD-10-CM | POA: Diagnosis not present

## 2021-10-16 DIAGNOSIS — D1721 Benign lipomatous neoplasm of skin and subcutaneous tissue of right arm: Secondary | ICD-10-CM | POA: Diagnosis not present

## 2021-10-24 ENCOUNTER — Other Ambulatory Visit: Payer: Self-pay | Admitting: Family Medicine

## 2021-10-24 ENCOUNTER — Telehealth: Payer: Self-pay | Admitting: Family Medicine

## 2021-10-24 NOTE — Telephone Encounter (Signed)
Pt wife called requesting for Rx Hydrochlorothiazide 12.5 MG and Finasteride 5 MG to be refilled and sent to [place], [place] ? ?Please advise.  ?

## 2021-10-25 ENCOUNTER — Other Ambulatory Visit: Payer: Self-pay

## 2021-10-25 DIAGNOSIS — N138 Other obstructive and reflux uropathy: Secondary | ICD-10-CM

## 2021-10-25 MED ORDER — FINASTERIDE 5 MG PO TABS: 5.0000 mg | ORAL_TABLET | Freq: Every day | ORAL | 0 refills | Status: DC

## 2021-10-25 NOTE — Telephone Encounter (Signed)
Requested refills sent.  

## 2021-10-31 ENCOUNTER — Telehealth: Payer: Self-pay | Admitting: Family Medicine

## 2021-10-31 ENCOUNTER — Other Ambulatory Visit: Payer: Self-pay

## 2021-10-31 DIAGNOSIS — N138 Other obstructive and reflux uropathy: Secondary | ICD-10-CM

## 2021-10-31 DIAGNOSIS — N401 Enlarged prostate with lower urinary tract symptoms: Secondary | ICD-10-CM

## 2021-10-31 MED ORDER — TAMSULOSIN HCL 0.4 MG PO CAPS: ORAL_CAPSULE | ORAL | 1 refills | Status: DC

## 2021-10-31 NOTE — Telephone Encounter (Signed)
Refill sent to [facility].  

## 2021-10-31 NOTE — Telephone Encounter (Signed)
Wife called to request a refill for Rx Tamsulosin (Flomax) 0.4 MG CAPS to be sent to  ? ?[place], [place] Phone:  [phone]  ?Fax:  [phone]  ?  ? ?Please advise.  ?

## 2021-11-06 ENCOUNTER — Telehealth: Payer: Self-pay | Admitting: Family Medicine

## 2021-11-06 ENCOUNTER — Other Ambulatory Visit: Payer: Self-pay

## 2021-11-06 MED ORDER — ATORVASTATIN CALCIUM 20 MG PO TABS: 20.0000 mg | ORAL_TABLET | Freq: Every day | ORAL | 3 refills | Status: DC

## 2021-11-06 NOTE — Telephone Encounter (Signed)
Rx sent as requested.

## 2021-11-06 NOTE — Telephone Encounter (Signed)
Calling for refill of atorvastatin (LIPITOR) 20 MG tablet  ?[place], [place] Phone:  [phone]  ?Fax:  [phone]  ?  ? ?

## 2021-11-08 ENCOUNTER — Other Ambulatory Visit: Payer: Self-pay | Admitting: Family Medicine

## 2021-11-08 DIAGNOSIS — N138 Other obstructive and reflux uropathy: Secondary | ICD-10-CM

## 2021-11-14 DIAGNOSIS — H01014 Ulcerative blepharitis left upper eyelid: Secondary | ICD-10-CM | POA: Diagnosis not present

## 2021-11-14 DIAGNOSIS — H01011 Ulcerative blepharitis right upper eyelid: Secondary | ICD-10-CM | POA: Diagnosis not present

## 2021-11-14 DIAGNOSIS — D3132 Benign neoplasm of left choroid: Secondary | ICD-10-CM | POA: Diagnosis not present

## 2021-11-14 DIAGNOSIS — H25813 Combined forms of age-related cataract, bilateral: Secondary | ICD-10-CM | POA: Diagnosis not present

## 2021-11-17 DIAGNOSIS — M5451 Vertebrogenic low back pain: Secondary | ICD-10-CM | POA: Diagnosis not present

## 2021-11-17 DIAGNOSIS — R2689 Other abnormalities of gait and mobility: Secondary | ICD-10-CM | POA: Diagnosis not present

## 2021-11-23 ENCOUNTER — Telehealth: Payer: Self-pay | Admitting: Neurology

## 2021-11-23 MED ORDER — PRAMIPEXOLE DIHYDROCHLORIDE ER 1.5 MG PO TB24: 1.5000 mg | ORAL_TABLET | Freq: Every day | ORAL | 0 refills | Status: DC

## 2021-11-23 MED ORDER — CARBIDOPA-LEVODOPA 25-100 MG PO TABS: 1.0000 | ORAL_TABLET | Freq: Three times a day (TID) | ORAL | 0 refills | Status: DC

## 2021-11-23 NOTE — Telephone Encounter (Signed)
1. Which medications need refilled? (List name and dosage, if known) mirapex and carbidopa levodopa ? ?2. Which pharmacy/location is medication to be sent to? (include street and city if local pharmacy) [facility] ? ?3. Do they need a 30 day or 90 day supply? 90 days  ?

## 2021-11-23 NOTE — Telephone Encounter (Signed)
Called patient and informed him that his prescriptions have been sent.  ?

## 2021-12-01 DIAGNOSIS — M545 Low back pain, unspecified: Secondary | ICD-10-CM | POA: Diagnosis not present

## 2021-12-01 DIAGNOSIS — M5459 Other low back pain: Secondary | ICD-10-CM | POA: Diagnosis not present

## 2021-12-06 DIAGNOSIS — M545 Low back pain, unspecified: Secondary | ICD-10-CM | POA: Diagnosis not present

## 2021-12-06 DIAGNOSIS — M5459 Other low back pain: Secondary | ICD-10-CM | POA: Diagnosis not present

## 2021-12-08 DIAGNOSIS — M545 Low back pain, unspecified: Secondary | ICD-10-CM | POA: Diagnosis not present

## 2021-12-08 DIAGNOSIS — M5459 Other low back pain: Secondary | ICD-10-CM | POA: Diagnosis not present

## 2021-12-12 DIAGNOSIS — M545 Low back pain, unspecified: Secondary | ICD-10-CM | POA: Diagnosis not present

## 2021-12-12 DIAGNOSIS — M5459 Other low back pain: Secondary | ICD-10-CM | POA: Diagnosis not present

## 2021-12-13 NOTE — Progress Notes (Unsigned)
Assessment/Plan:   1.  Parkinsons Disease  -Continue pramipexole ER, 1.5 mg daily.  He has had no compulsive behaviors or sleep attacks.  He is tolerating it well.  -We will increase carbidopa/levodopa 25/100, 2 tablets at 6 AM/ 2 tablets at 10 AM/1 tablet at 3 PM  - He has seen derm.  He knows that Parkinsons Disease increases risk for melanoma.     Subjective:   [name] [name] was seen today in follow up for Parkinsons disease.  My previous records were reviewed prior to todays visit as well as outside records available to me. Parkinsons Disease been fairly stable.  No compulsive behaviors or sleep attacks.  No hallucinations.  Takes medication faithfully.  Continues to exercise.  He continues to have pretty back pain and is in PT now.  [facility].  Current prescribed movement disorder medications: Pramipexole ER, 1.5 mg daily Carbidopa/levodopa 25/100, 1 tablet 3 times per day.   PREVIOUS MEDICATIONS: Sinemet and Mirapex  ALLERGIES:   Allergies  Allergen Reactions   Penicillins Rash and Other (See Comments)    Did it involve swelling of the face/tongue/throat, SOB, or low BP? No Did it involve sudden or severe rash/hives, skin peeling, or any reaction on the inside of your mouth or nose? No Did you need to seek medical attention at a hospital or doctor's office? No When did it last happen?      childhood allergy If all above answers are "NO", may proceed with cephalosporin use.      CURRENT MEDICATIONS:  Outpatient Encounter Medications as of 12/14/2021  Medication Sig   albuterol (VENTOLIN HFA) 108 (90 Base) MCG/ACT inhaler Inhale 2 puffs into the lungs every 4 (four) hours as needed for wheezing or shortness of breath.   atorvastatin (LIPITOR) 20 MG tablet Take 1 tablet (20 mg total) by mouth daily.   carbidopa-levodopa (SINEMET IR) 25-100 MG tablet Take 1 tablet by mouth 3 (three) times daily. 6am/10am/3pm   esomeprazole (NEXIUM) 40 MG capsule  Take 1 capsule (40 mg total) by mouth daily.   finasteride (PROSCAR) 5 MG tablet Take 1 tablet (5 mg total) by mouth daily.   fluticasone-salmeterol (ADVAIR HFA) 115-21 MCG/ACT inhaler Inhale 2 puffs into the lungs 2 (two) times daily.   hydrochlorothiazide (MICROZIDE) 12.5 MG capsule TAKE 1 CAPSULE DAILY   loratadine (CLARITIN) 10 MG tablet Take 1 tablet (10 mg total) by mouth daily.   ondansetron (ZOFRAN) 8 MG tablet Take 1 tablet (8 mg total) by mouth every 8 (eight) hours as needed for nausea or vomiting.   polyethylene glycol (MIRALAX / GLYCOLAX) packet Take 8.5 g by mouth at bedtime.   potassium chloride (KLOR-CON) 10 MEQ tablet Take 1 tablet (10 mEq total) by mouth 2 (two) times daily.   Pramipexole Dihydrochloride (MIRAPEX ER) 1.5 MG TB24 Take 1 tablet (1.5 mg total) by mouth daily.   tamsulosin (FLOMAX) 0.4 MG CAPS capsule TAKE 1 CAPSULE(0.4 MG) BY MOUTH DAILY   No facility-administered encounter medications on file as of 12/14/2021.    Objective:   PHYSICAL EXAMINATION:    VITALS:   Vitals:   12/14/21 1043  BP: 133/82  Pulse: 80  SpO2: 95%  Weight: 234 lb (106.1 kg)     GEN:  The patient appears stated age and is in NAD. HEENT:  Normocephalic, atraumatic.   Neurological examination:  Orientation: The patient is alert and oriented x3. Cranial nerves: There is good facial symmetry with min  facial hypomimia (lips parted). The speech is fluent and clear. Soft palate rises symmetrically and there is no tongue deviation. Hearing is intact to conversational tone. Sensation: Sensation is intact to light touch throughout Motor: Strength is 5/5 in the UE and at least antigravity in the LE  Movement examination: Tone: There is mild increased tone in the RUE Abnormal movements: there is RUE rest tremor Coordination:  There is mild decremation with RAM's, with hand opening and closing on the right and with toe taps on the L Gait and Station: The patient pushes off to arise. The  patient's stride length is good.  He is forward flexed at the waist (same as last visit)  I have reviewed and interpreted the following labs independently    Chemistry      Component Value Date/Time   NA 139 02/24/2021 1206   K 3.4 (L) 02/24/2021 1206   CL 101 02/24/2021 1206   CO2 24 02/24/2021 1206   BUN 18 02/24/2021 1206   CREATININE 0.85 02/24/2021 1206   CREATININE 0.65 (L) 02/24/2020 1208      Component Value Date/Time   CALCIUM 8.9 02/24/2021 1206   ALKPHOS 89 02/24/2021 1206   AST 18 02/24/2021 1206   ALT 7 02/24/2021 1206   BILITOT 4.0 (H) 02/24/2021 1206       Lab Results  Component Value Date   WBC 7.0 02/24/2021   HGB 15.2 02/24/2021   HCT 43.9 02/24/2021   MCV 90.5 02/24/2021   PLT 198.0 02/24/2021    Lab Results  Component Value Date   TSH 1.88 02/24/2021     Total time spent on today's visit was 88mnutes, including both face-to-face time and nonface-to-face time.  Time included that spent on review of records (prior notes available to me/labs/imaging if pertinent), discussing treatment and goals, answering patient's questions and coordinating care.  Cc:  FLaurey Morale MD

## 2021-12-14 ENCOUNTER — Ambulatory Visit (INDEPENDENT_AMBULATORY_CARE_PROVIDER_SITE_OTHER): Payer: Medicare Other | Admitting: Neurology

## 2021-12-14 ENCOUNTER — Encounter: Payer: Self-pay | Admitting: Neurology

## 2021-12-14 VITALS — BP 133/82 | HR 80 | Wt 234.0 lb

## 2021-12-14 DIAGNOSIS — G20 Parkinson's disease: Secondary | ICD-10-CM | POA: Diagnosis not present

## 2021-12-14 MED ORDER — CARBIDOPA-LEVODOPA 25-100 MG PO TABS
ORAL_TABLET | ORAL | 1 refills | Status: DC
Start: 2021-12-14 — End: 2022-07-20

## 2021-12-14 MED ORDER — PRAMIPEXOLE DIHYDROCHLORIDE ER 1.5 MG PO TB24
1.5000 mg | ORAL_TABLET | Freq: Every day | ORAL | 1 refills | Status: DC
Start: 2021-12-14 — End: 2022-08-24

## 2021-12-14 NOTE — Patient Instructions (Addendum)
increase carbidopa/levodopa 25/100, 2 tablets at 6 AM/ 2 tablets at 10 AM/1 tablet at 3 PM  Local and Online Resources for Power over Parkinson's Group May 2023  LOCAL [place] PARKINSON'S GROUPS  Power over Parkinson's Group:   Power Over Parkinson's Patient Education Group will be Wednesday, May 10th-*Hybrid meting*- in person at [place] location and via [facility] at 2:00 pm.   Upcoming Power over Parkinson's Meetings:  2nd Wednesdays of the month at 2 pm:  May 10th, June 14th, July 12th Contact Amy Marriott at [email or web address] if interested in participating in this group Parkinson's Care Partners Group:    3rd Mondays, Contact Misty Paladino Atypical Parkinsonian Patient Group:   4th Wednesdays, [place] If you are interested in participating in these groups with Misty, please contact her directly for how to join those meetings.  Her contact information is [email or web address].    LOCAL EVENTS AND NEW OFFERINGS Moving Day [place]:  Saturday, May 6th, 9:30 am at [place], [place], [place]. Participate in Moving Day as a way to "honor loved ones, raise funds, fight Parkinson's disease, and celebrate movement."  Register today at [employer].MovingDay[place]!  Play [place]!  Join [place] for home game for a fun evening to bring awareness of Parkinson's and raise funds for our Movement Disorder Funds. Rescheduled to May 11th  6:30 pm [place]. To purchase tickets:  [email or web address] Parkinson's T-shirts for sale!  Designed by a local group member, with funds going to [place].  $25.00  Investment banker, corporate to purchase  [employer]! Moves [employer] Instructor-Led Class offering at [employer]!  Wednesdays 1-2 pm, starting April 12th.   Contact Bryson Dames, Acupuncturist at [employer].  [email or web address]  [employer]:  www.[email or web address] PD Health at Home continues:  Mindfulness Mondays, Wellness Wednesdays, Fitness Fridays  Upcoming Education:  Understanding Gene and Cell-Based Therapies in Parkinson's.  Wednesday, May 10th at 1:00 pm Additional Education offerings virtually through their website-upcoming topics include Palliative Care/Hospice and PD, Sleep and PD Register for expert briefings (webinars) at [email or web address] Please check out their website to sign up for emails and see their full online offerings   [place]:  [email or web address]  Third Thursday Webinars:  On the third Thursday of every month at 12 p.m. ET, join our free live webinars to learn about various aspects of living with Parkinson's disease and our work to speed medical breakthroughs. Upcoming Webinar: Get Moving: Exercising for a Healthy Brain.  Thursday, May 18th  at  12 noon. Check out additional information on their website to see their full online offerings  [facility]:  [email or web address] Upcoming Webinar:   Stay tuned Webinar Series:  Living with Parkinson's Meetup.   Third Thursdays each month, 3 pm Care Partner Monthly Meetup.  With Robin Searing [place].  First Tuesday of each month, 2 pm Check out additional information to Live Well Today on their website  Parkinson and Movement Disorders (PMD) Alliance:  www.pmdalliance.org [employer] Online:  Online Education Events Sign up for emails, which are sent weekly to give you updates on programming and online offerings  Parkinson's Association of the [place]:  www.parkinsonassociation.org Information on online support groups, education events, and online exercises including Yoga, Parkinson's exercises and more-LOTS of information on links to PD resources and online events Virtual Support Group through Parkinson's Association of the  [place]; next one is scheduled for Wednesday, May 3rd at 2 pm. (These are typically  scheduled for the 1st Wednesday of the month at 2 pm).  Visit website for details. MOVEMENT AND EXERCISE OPPORTUNITIES Parkinson's DRUMMING Classes/Music Therapy with Doylene Canning:  This is a returning class and it's FREE!  2nd Mondays, continuing May 8th, 11:00 at the [place].  Contact *Misty Taylor-Paladino at [email or web address] or Doylene Canning at [phone] or [email or web address]  PWR! Moves Classes at [place].  Wednesdays 10 and 11 am.   Contact Amy Marriott, PT [email or web address] if interested. NEW PWR! Moves Class offering at [employer].  Wednesdays 1-2 pm, starting April 12th.  Contact Bryson Dames, Acupuncturist at [employer].  [email or web address] Here is a link to the PWR!Moves classes on Zoom from [place] - Daily Mon-Sat at 10:00. Via Zoom, FREE and open to all.  There is also a link below via Facebook if you use that platform.  [email or web address].si [email or web address]://[email or web address].no  Parkinson's Wellness Recovery (PWR! Moves)  www.pwr4life.org Info on the PWR! Virtual Experience:  You will have access to our expertise through self-assessment, guided plans that start with the PD-specific fundamentals, educational content, tips, Q&A with an expert, and a growing Art therapist of PD-specific pre-recorded and live exercise classes of varying types and intensity - both physical and cognitive! If that is not enough, we offer 1:1 wellness consultations (in-person or virtual) to personalize your PWR! Research scientist (medical).  Bells Fridays:  As part of the PD Health @ Home program, this free video series  focuses each week on one aspect of fitness designed to support people living with Parkinson's.  These weekly videos highlight the [place] recent fitness guidelines for people with Parkinson's disease. ModemGamers.si Dance for PD website is offering free, live-stream classes throughout the week, as well as links to [employer] of classes:  https://danceforparkinsons.org/ Virtual dance and Pilates for Parkinson's classes: Click on the Community Tab> Parkinson's Movement Initiative Tab.  To register for classes and for more information, visit www.SeekAlumni.co.za and click the "community" tab.  [employer] Parkinson's Cycling Classes  [employer]:  Thursdays @ Noon-Live classes at [employer] ([employer] at [email or web address] or ([phone]) [employer]: Virtual Classes Mondays and Thursdays Jeanette Caprice classes Tuesday, Wednesday and Thursday (contact [place] at [email or web address]  or [phone]) [place] Varied levels of classes are offered Mondays, Tuesdays and Thursdays at [facility].  Stretching with Verdis Frederickson weekly class is also offered for people with Parkinson's To observe a class or for more information, call [phone] or email Hezzie Bump at [email or web address] .[email or web address] ADDITIONAL SUPPORT AND RESOURCES Well[employer]:Online Caregiver Education Opportunities:  www.well-springsolutions.org/caregiver-education/caregiver-support-group.  You may also contact Vickki Muff at [email or web address] or [phone].    Well-Spring Navigator:  [phone] program, a free service to help individuals and families through the journey of determining care for older adults.  The "Navigator" is a [employer], Education officer, museum, who will speak with a prospective client and/or loved ones to provide an assessment of the situation and a set of recommendations for a  personalized care plan -- all free of charge, and whether Well[employer]Spring [employer] offers the needed service or not. If the need is not a service we provide, we are well-connected with reputable programs in town that we can refer you to.  www.well-springsolutions.org or to speak with the Navigator, call [phone].

## 2021-12-18 DIAGNOSIS — M5459 Other low back pain: Secondary | ICD-10-CM | POA: Diagnosis not present

## 2021-12-18 DIAGNOSIS — M545 Low back pain, unspecified: Secondary | ICD-10-CM | POA: Diagnosis not present

## 2021-12-20 ENCOUNTER — Telehealth: Payer: Self-pay | Admitting: Neurology

## 2021-12-20 ENCOUNTER — Telehealth: Payer: Self-pay | Admitting: Family Medicine

## 2021-12-20 NOTE — Telephone Encounter (Signed)
Pt called in stating that after increasing his medication, the tremors have almost disappeared. He said every now and then he will notice a slight quiver, but nothing like it was. He is very happy how things are going.

## 2021-12-20 NOTE — Telephone Encounter (Signed)
Left message for patient to call back and schedule Medicare Annual Wellness Visit (AWV) either virtually or in office. Left  my jabber number [phone]   Last AWV 12/27/20 ; please schedule at anytime with [facility] Nurse Health Advisor 1 or 2    

## 2021-12-21 DIAGNOSIS — M5459 Other low back pain: Secondary | ICD-10-CM | POA: Diagnosis not present

## 2021-12-21 DIAGNOSIS — M545 Low back pain, unspecified: Secondary | ICD-10-CM | POA: Diagnosis not present

## 2021-12-25 DIAGNOSIS — M5459 Other low back pain: Secondary | ICD-10-CM | POA: Diagnosis not present

## 2021-12-25 DIAGNOSIS — M545 Low back pain, unspecified: Secondary | ICD-10-CM | POA: Diagnosis not present

## 2021-12-27 ENCOUNTER — Telehealth: Payer: Self-pay

## 2021-12-27 DIAGNOSIS — M545 Low back pain, unspecified: Secondary | ICD-10-CM | POA: Diagnosis not present

## 2021-12-27 DIAGNOSIS — M5459 Other low back pain: Secondary | ICD-10-CM | POA: Diagnosis not present

## 2021-12-27 NOTE — Telephone Encounter (Signed)
Called patient to discuss billing from 12/27/2020. Patient was charged for this visit and this should have been a covered service. Billing made aware and patient was refunded. I gave patient a call and made wife Justen Fonda aware that patient would be refunded today for this visit.

## 2022-01-01 DIAGNOSIS — M545 Low back pain, unspecified: Secondary | ICD-10-CM | POA: Diagnosis not present

## 2022-01-01 DIAGNOSIS — M5459 Other low back pain: Secondary | ICD-10-CM | POA: Diagnosis not present

## 2022-01-03 DIAGNOSIS — M5459 Other low back pain: Secondary | ICD-10-CM | POA: Diagnosis not present

## 2022-01-03 DIAGNOSIS — M545 Low back pain, unspecified: Secondary | ICD-10-CM | POA: Diagnosis not present

## 2022-01-12 ENCOUNTER — Other Ambulatory Visit: Payer: Self-pay | Admitting: Family Medicine

## 2022-01-17 ENCOUNTER — Other Ambulatory Visit: Payer: Self-pay

## 2022-01-17 ENCOUNTER — Telehealth: Payer: Self-pay | Admitting: Family Medicine

## 2022-01-17 MED ORDER — HYDROCHLOROTHIAZIDE 12.5 MG PO CAPS: 12.5000 mg | ORAL_CAPSULE | Freq: Every day | ORAL | 0 refills | Status: DC

## 2022-01-17 NOTE — Telephone Encounter (Signed)
Pt needs refill of the   hydrochlorothiazide (MICROZIDE) 12.5 MG capsule   Please send to: [place], [place] Phone:  [phone]  Fax:  [phone]

## 2022-01-17 NOTE — Telephone Encounter (Signed)
Rx sent 

## 2022-02-21 ENCOUNTER — Encounter: Payer: Self-pay | Admitting: Family Medicine

## 2022-02-21 ENCOUNTER — Ambulatory Visit (INDEPENDENT_AMBULATORY_CARE_PROVIDER_SITE_OTHER): Payer: Medicare Other | Admitting: Family Medicine

## 2022-02-21 VITALS — BP 128/78 | HR 82 | Temp 98.6°F | Wt 233.0 lb

## 2022-02-21 DIAGNOSIS — R600 Localized edema: Secondary | ICD-10-CM

## 2022-02-21 DIAGNOSIS — K439 Ventral hernia without obstruction or gangrene: Secondary | ICD-10-CM | POA: Diagnosis not present

## 2022-02-21 NOTE — Progress Notes (Signed)
   Subjective:    Patient ID: [name] [name], male    DOB: [birth year], 71 y.o.   MRN: [id number]  HPI Here for several issues. First he has had a little more swelling than usual in the right lower leg and several fluid filled blisters have come up in the past week. There has been no pain or fever, and the skin has not been red. No fever. He has had bouts of cellulitis in the past and he wants [place] to check this. Also we have been following a ventral hernia for several years and he thinks it has gotten slightly larger in the past month. It does not bother him at all.    Review of Systems  Constitutional: Negative.   Respiratory: Negative.    Cardiovascular:  Positive for leg swelling. Negative for chest pain and palpitations.  Gastrointestinal: Negative.   Skin:  Negative for color change.       Objective:   Physical Exam Constitutional:      General: He is not in acute distress.    Appearance: Normal appearance.  Cardiovascular:     Rate and Rhythm: Normal rate and regular rhythm.     Pulses: Normal pulses.     Heart sounds: Normal heart sounds.  Pulmonary:     Effort: Pulmonary effort is normal.     Breath sounds: Normal breath sounds.  Abdominal:     General: Abdomen is flat. Bowel sounds are normal. There is no distension.     Palpations: Abdomen is soft.     Tenderness: There is no abdominal tenderness. There is no guarding or rebound.     Comments: There is a small non-tender easily reducible ventral hernia just superior to the umbilicus   Musculoskeletal:     Comments: 1+ edema in the right lower leg and trace edema in the left lower leg. The right lower leg has some hyperpigmentation as usual, but there is no erythema. No warmth or tenderness   Neurological:     Mental Status: He is alert.           Assessment & Plan:  The venous insufficiency in the legs is stable. There are no signs of cellulitis. The ventral hernia appears to be stable. We will continue to  observe both of these areas.  [name], MD

## 2022-03-13 ENCOUNTER — Encounter: Payer: Self-pay | Admitting: Neurology

## 2022-04-12 ENCOUNTER — Telehealth: Payer: Self-pay | Admitting: Family Medicine

## 2022-04-12 ENCOUNTER — Other Ambulatory Visit: Payer: Self-pay

## 2022-04-12 DIAGNOSIS — N401 Enlarged prostate with lower urinary tract symptoms: Secondary | ICD-10-CM

## 2022-04-12 MED ORDER — FINASTERIDE 5 MG PO TABS: 5.0000 mg | ORAL_TABLET | Freq: Every day | ORAL | 1 refills | Status: DC

## 2022-04-12 NOTE — Telephone Encounter (Signed)
Refill sent.

## 2022-04-12 NOTE — Telephone Encounter (Signed)
Pt wife call and stated pt need a refill on finasteride (PROSCAR) 5 MG tablet sent to  [place], [place] Phone:  [phone]  Fax:  [phone]

## 2022-04-23 ENCOUNTER — Telehealth: Payer: Self-pay | Admitting: Family Medicine

## 2022-04-23 NOTE — Telephone Encounter (Signed)
Pt's spouse called to request a refill of the  hydrochlorothiazide (MICROZIDE) 12.5 MG capsule  LOV:  02/21/22  [facility] [place], [place] Phone:  [phone]  Fax:  [phone]

## 2022-04-24 ENCOUNTER — Other Ambulatory Visit: Payer: Self-pay

## 2022-04-24 MED ORDER — HYDROCHLOROTHIAZIDE 12.5 MG PO CAPS: 12.5000 mg | ORAL_CAPSULE | Freq: Every day | ORAL | 0 refills | Status: DC

## 2022-04-24 NOTE — Telephone Encounter (Signed)
Refill sent to [facility].  

## 2022-05-07 ENCOUNTER — Other Ambulatory Visit: Payer: Self-pay

## 2022-05-07 ENCOUNTER — Telehealth: Payer: Self-pay | Admitting: Family Medicine

## 2022-05-07 DIAGNOSIS — N138 Other obstructive and reflux uropathy: Secondary | ICD-10-CM

## 2022-05-07 MED ORDER — TAMSULOSIN HCL 0.4 MG PO CAPS: ORAL_CAPSULE | ORAL | 1 refills | Status: DC

## 2022-05-07 MED ORDER — POTASSIUM CHLORIDE ER 10 MEQ PO TBCR: EXTENDED_RELEASE_TABLET | ORAL | 0 refills | Status: DC

## 2022-05-07 NOTE — Telephone Encounter (Signed)
[facility]

## 2022-05-07 NOTE — Telephone Encounter (Signed)
Pt's wife called to request a refill of the following:  tamsulosin (FLOMAX) 0.4 MG CAPS capsule  potassium chloride (KLOR-CON) 10 MEQ tablet  [facility] [place], [place] - [address] Phone:  [phone]  Fax:  [phone]     LOV:  02/21/22

## 2022-05-11 DIAGNOSIS — Z23 Encounter for immunization: Secondary | ICD-10-CM | POA: Diagnosis not present

## 2022-05-23 DIAGNOSIS — D485 Neoplasm of uncertain behavior of skin: Secondary | ICD-10-CM | POA: Diagnosis not present

## 2022-05-23 DIAGNOSIS — L570 Actinic keratosis: Secondary | ICD-10-CM | POA: Diagnosis not present

## 2022-05-23 DIAGNOSIS — C44219 Basal cell carcinoma of skin of left ear and external auricular canal: Secondary | ICD-10-CM | POA: Diagnosis not present

## 2022-05-25 ENCOUNTER — Telehealth: Payer: Self-pay | Admitting: Family Medicine

## 2022-05-25 MED ORDER — ONDANSETRON HCL 8 MG PO TABS: 8.0000 mg | ORAL_TABLET | Freq: Three times a day (TID) | ORAL | 5 refills | Status: DC | PRN

## 2022-05-25 NOTE — Telephone Encounter (Signed)
Refill ondansetron (ZOFRAN) 8 MG tablet  [facility] [phone] - [place], [place] - [address] AT [facility] [address] Phone: [phone]  Fax: [phone]    .

## 2022-05-25 NOTE — Telephone Encounter (Signed)
Last refill-09-28-20* Last OV-02-21-22

## 2022-05-25 NOTE — Addendum Note (Signed)
Addended by: [name] A on: 05/25/2022 04:03 PM   Modules accepted: Orders

## 2022-05-25 NOTE — Telephone Encounter (Signed)
Done

## 2022-06-21 ENCOUNTER — Ambulatory Visit: Payer: Medicare Other | Admitting: Neurology

## 2022-06-25 ENCOUNTER — Telehealth: Payer: Self-pay | Admitting: Family Medicine

## 2022-06-25 ENCOUNTER — Other Ambulatory Visit: Payer: Self-pay

## 2022-06-25 MED ORDER — POTASSIUM CHLORIDE ER 10 MEQ PO TBCR: EXTENDED_RELEASE_TABLET | ORAL | 1 refills | Status: DC

## 2022-06-25 NOTE — Telephone Encounter (Signed)
Pt wife is calling and would like a refill on potassium chloride (KLOR-CON) 10 MEQ tablet  [facility] [place], [place] Phone: [phone]  Fax: [phone]

## 2022-06-25 NOTE — Telephone Encounter (Signed)
Rx sent as requested.

## 2022-06-26 NOTE — Progress Notes (Unsigned)
Assessment/Plan:   1.  Parkinsons Disease  -Continue pramipexole ER, 1.5 mg daily.  He has had no compulsive behaviors or sleep attacks.  He is tolerating it well.  -Continue carbidopa/levodopa 25/100, 2 tablets at 6 AM/ 2 tablets at 10 AM/1 tablet at 3 PM  - He has seen derm.  He knows that Parkinsons Disease increases risk for melanoma.  He is getting ready to have Yetter removed.   Subjective:   [name] [name] was seen today in follow up for Parkinsons disease.  My previous records were reviewed prior to todays visit as well as outside records available to me.  I did slightly increase his levodopa last visit and he did well with that.  It really helps tremor.  He continues to take pramipexole as well, without compulsive behaviors or sleep attacks.  He had one fall off of a ladder (had one foot on a ladder and one on the counter and the ladder went out from under him), while changing his clocks.  He is still having back pain.  Current prescribed movement disorder medications: Pramipexole ER, 1.5 mg daily Carbidopa/levodopa 25/100, 2/2/1 (increased last visit)   PREVIOUS MEDICATIONS: Sinemet and Mirapex  ALLERGIES:   Allergies  Allergen Reactions   Penicillins Rash and Other (See Comments)    Did it involve swelling of the face/tongue/throat, SOB, or low BP? No Did it involve sudden or severe rash/hives, skin peeling, or any reaction on the inside of your mouth or nose? No Did you need to seek medical attention at a hospital or doctor's office? No When did it last happen?      childhood allergy If all above answers are "NO", may proceed with cephalosporin use.      CURRENT MEDICATIONS:  Outpatient Encounter Medications as of 06/28/2022  Medication Sig   albuterol (VENTOLIN HFA) 108 (90 Base) MCG/ACT inhaler Inhale 2 puffs into the lungs every 4 (four) hours as needed for wheezing or shortness of breath.   atorvastatin (LIPITOR) 20 MG tablet Take 1 tablet (20 mg total) by  mouth daily.   carbidopa-levodopa (SINEMET IR) 25-100 MG tablet 2 at 6 AM/ 2 at 10 AM/1 at 3 PM   esomeprazole (NEXIUM) 40 MG capsule Take 1 capsule (40 mg total) by mouth daily.   finasteride (PROSCAR) 5 MG tablet Take 1 tablet (5 mg total) by mouth daily.   fluticasone-salmeterol (ADVAIR HFA) 115-21 MCG/ACT inhaler Inhale 2 puffs into the lungs 2 (two) times daily.   hydrochlorothiazide (MICROZIDE) 12.5 MG capsule Take 1 capsule (12.5 mg total) by mouth daily.   ondansetron (ZOFRAN) 8 MG tablet Take 1 tablet (8 mg total) by mouth every 8 (eight) hours as needed for nausea or vomiting.   polyethylene glycol (MIRALAX / GLYCOLAX) packet Take 8.5 g by mouth at bedtime.   potassium chloride (KLOR-CON) 10 MEQ tablet TAKE 1 TABLET(10 MEQ) BY MOUTH DAILY   Pramipexole Dihydrochloride (MIRAPEX ER) 1.5 MG TB24 Take 1 tablet (1.5 mg total) by mouth daily.   tamsulosin (FLOMAX) 0.4 MG CAPS capsule TAKE 1 CAPSULE(0.4 MG) BY MOUTH DAILY   No facility-administered encounter medications on file as of 06/28/2022.    Objective:   PHYSICAL EXAMINATION:    VITALS:   Vitals:   06/28/22 1027  BP: (!) 142/74  Pulse: 72  SpO2: 96%  Weight: 232 lb (105.2 kg)  Height: '5\' 11"'$  (1.803 m)      GEN:  The patient appears stated age and is in NAD. HEENT:  Normocephalic, atraumatic.   Neurological examination:  Orientation: The patient is alert and oriented x3. Cranial nerves: There is good facial symmetry with min facial hypomimia (lips parted). The speech is fluent and clear. Soft palate rises symmetrically and there is no tongue deviation. Hearing is intact to conversational tone. Sensation: Sensation is intact to light touch throughout Motor: Strength is 5/5 in the UE and at least antigravity in the LE  Movement examination: Tone: There is nl tone in the UE/LE Abnormal movements: there is rare RUE rest tremor Coordination:  There is mild decremation with RAM's, with hand opening and closing on the  right and with toe taps on the L Gait and Station: The patient pushes off to arise. The patient's stride length is good.  He is forward flexed at the waist (same as last visit)  I have reviewed and interpreted the following labs independently    Chemistry      Component Value Date/Time   NA 139 02/24/2021 1206   K 3.4 (L) 02/24/2021 1206   CL 101 02/24/2021 1206   CO2 24 02/24/2021 1206   BUN 18 02/24/2021 1206   CREATININE 0.85 02/24/2021 1206   CREATININE 0.65 (L) 02/24/2020 1208      Component Value Date/Time   CALCIUM 8.9 02/24/2021 1206   ALKPHOS 89 02/24/2021 1206   AST 18 02/24/2021 1206   ALT 7 02/24/2021 1206   BILITOT 4.0 (H) 02/24/2021 1206       Lab Results  Component Value Date   WBC 7.0 02/24/2021   HGB 15.2 02/24/2021   HCT 43.9 02/24/2021   MCV 90.5 02/24/2021   PLT 198.0 02/24/2021    Lab Results  Component Value Date   TSH 1.88 02/24/2021     Total time spent on today's visit was 25 minutes, including both face-to-face time and nonface-to-face time.  Time included that spent on review of records (prior notes available to me/labs/imaging if pertinent), discussing treatment and goals, answering patient's questions and coordinating care.  Cc:  [name], MD

## 2022-06-28 ENCOUNTER — Ambulatory Visit (INDEPENDENT_AMBULATORY_CARE_PROVIDER_SITE_OTHER): Payer: Medicare Other | Admitting: Neurology

## 2022-06-28 ENCOUNTER — Encounter: Payer: Self-pay | Admitting: Neurology

## 2022-06-28 VITALS — BP 142/74 | HR 72 | Ht 71.0 in | Wt 232.0 lb

## 2022-06-28 DIAGNOSIS — G20A1 Parkinson's disease without dyskinesia, without mention of fluctuations: Secondary | ICD-10-CM

## 2022-06-28 NOTE — Patient Instructions (Signed)
Local and Online Resources for Power over Parkinson's Group  December 2023    LOCAL [place] PARKINSON'S GROUPS   Power over Parkinson's Group:    Power Over Parkinson's Patient Education Group will be Wednesday, December 13th-*Hybrid meting*- in person at [facility] location and via WEBEX, 2:00-3:00 pm.   Starting in November, Power over Parkinson's and Care Partner Groups will meet together, with plans for separate break out session for caregivers (*this will be evolving over the next few months) Upcoming Power over Parkinson's Meetings/Care Partner Support:  2nd Wednesdays of the month at 2 pm:   December 13th, January 10th  Contact Amy Marriott at [email or web address] if interested in participating in this group    LOCAL EVENTS AND NEW OFFERINGS  Parkinson's Holiday Party!  Wednesday, December 6th, 4:00-5:00 pm.  [employer].  RSVP to Karen Simmers at [phone] or [email or web address] New PWR! Moves Community Fitness Instructor-Led Classes offering at [employer]!  TUESDAYS and Wednesdays 1-2 pm.   Contact Christy Weaver at  [email or web address]  or [phone] (Tuesday classes are modified for chair and standing only) Dance for Parkinson 's classes will be on Tuesdays 9:30am-10:30am starting October 3-December 12 with a break the week of November 21st. Located in the [facility], in the first floor of the [facility] ([address] To register:  [email or web address] or [phone]  Drumming for Parkinson's will be held on 2nd and 4th Mondays at 11:00 am.   Located at the [employer] ([address] [place]  Contact Jane Maydian at [email or web address] or [phone]  Through support from the Parkinson's Foundation, the Dance and Drumming for Parkinson's classes are free for both patients and caregivers.    [facility] [employer] Parkinson's Tai Chi Class, Mondays at  11 am.  Call [phone] for details   ONLINE EDUCATION AND SUPPORT  Parkinson Foundation:  www.parkinson.org  PD Health at Home continues:  Mindfulness Mondays, Wellness Wednesdays, Fitness Fridays   Upcoming Education:    Eating and Feeling Well through the Holidays. Wednesday, Dec. 6th,  1-2 pm  Hospital Safety.  Wednesday, Dec. 13th, 1-2 pm Register for expert briefings (webinars) at [email or web address]://www.[email or web address].org/resources-support/online-education/expert-[email or web address]-webinars  Please check out their website to sign up for emails and see their full online offerings      [employer] Foundation:  www.[email or web address]   Third Thursday Webinars:  On the third Thursday of every month at 12 p.m. ET, join our free live webinars to learn about various aspects of living with Parkinson's disease and our work to speed medical breakthroughs.  Upcoming Webinar:  Tools for Diagnosing and Visualizing Parkinson's Disease.  Thursday, December 21st at 12 noon. Check out additional information on their website to see their full online offerings    Davis Phinney Foundation:  www.davisphinneyfoundation.org  Upcoming Webinar:   Stay tuned  Webinar Series:  Living with Parkinson's Meetup.   Third Thursdays each month, 3 pm  Care Partner Monthly Meetup.  With Connie Carpenter Phinney.  First Tuesday of each month, 2 pm  Check out additional information to Live Well Today on their website    Parkinson and Movement Disorders (PMD) Alliance:  www.pmdalliance.org  [employer]:  Online Education Events  Sign up for emails, which are sent weekly to give you updates on programming and online offerings    Parkinson's Association of the [place]:  www.parkinsonassociation.org  Information on online support groups, education events, and online exercises including Yoga, Parkinson's exercises and more-LOTS of information on   links to PD resources and online events  Virtual Support Group through Parkinson's Association  of the [place]; next one is scheduled for Wednesday, December 6th  at 2 pm.  (These are typically scheduled for the 1st Wednesday of the month at 2 pm).  Visit website for details.   MOVEMENT AND EXERCISE OPPORTUNITIES  PWR! Moves Classes at [place].  Wednesdays 10 and 11 am.   Contact Amy Marriott, PT [email or web address] if interested.  NEW PWR! Moves Class offerings at [employer].  *TUESDAYS* and Wednesdays 1-2 pm.    Contact Vonna Kotyk at  [email or web address]    Parkinson's Wellness Recovery (PWR! Moves)  www.pwr4life.org  Info on the PWR! Virtual Experience:  You will have access to our expertise?through self-assessment, guided plans that start with the PD-specific fundamentals, educational content, tips, Q&A with an expert, and a growing Art therapist of PD-specific pre-recorded and live exercise classes of varying types and intensity - both physical and cognitive! If that is not enough, we offer 1:1 wellness consultations (in-person or virtual) to personalize your PWR! Research scientist (medical).   [place] Fridays:   As part of the PD Health @ Home program, this free video series focuses each week on one aspect of fitness designed to support people living with Parkinson's.? These weekly videos highlight the [place] fitness guidelines for people with Parkinson's disease.  ModemGamers.[email or web address]   Dance for PD website is offering free, live-stream classes throughout the week, as well as links to [employer] of classes:  [email or web address].[email or web address]/  Virtual dance and Pilates for Parkinson's classes: Click on the Community Tab> Parkinson's Movement Initiative Tab.  To register for classes and for more information, visit [email or web address] and click the "community" tab.   [employer] Parkinson's Cycling Classes   [facility]:  Thursdays @ Noon-Live classes at [employer] ([employer] [facility] [email or web address]?or [phone])  [facility]: Virtual Classes Mondays and Thursdays Jeanette Caprice classes Tuesday, Wednesday and Thursday (contact [place] at [email or web address] ?or [phone])  [place]  Varied levels of classes are offered Tuesdays and Thursdays at [employer].   Stretching with Verdis Frederickson weekly class is also offered for people with Parkinson's  To observe a class or for more information, call [phone] or email Hezzie Bump at [email or web address]   ADDITIONAL SUPPORT AND RESOURCES  Well[employer]:Online Caregiver Education Opportunities:  www.well-springsolutions.org/caregiver-education/caregiver-support-group.  You may also contact Vickki Muff at [email or web address] or [phone].     Well-Spring Navigator:  Just1Navigator program, a?free service to help individuals and families through the journey of determining care for older adults.  The "Navigator" is a Education officer, museum, Arnell Asal, who will speak with a prospective client and/or loved ones to provide an assessment of the situation and a set of recommendations for a personalized care plan -- all free of charge, and whether?Well[employer]Spring [employer] offers the needed service or not. If the need is not a service we provide, we are well-connected with reputable programs in town that we can refer you to.  www.well-springsolutions.org or to speak with the Navigator, call [phone].

## 2022-07-20 ENCOUNTER — Telehealth: Payer: Self-pay | Admitting: Neurology

## 2022-07-20 MED ORDER — CARBIDOPA-LEVODOPA 25-100 MG PO TABS: ORAL_TABLET | ORAL | 1 refills | Status: DC

## 2022-07-20 NOTE — Telephone Encounter (Signed)
1. Which medications need refilled? (List name and dosage, if known) carbidopa-levodopa - pharmacy is out of refills  2. Which pharmacy/location is medication to be sent to? (include street and city if [employer]) [facility]

## 2022-07-23 ENCOUNTER — Other Ambulatory Visit: Payer: Self-pay

## 2022-07-23 ENCOUNTER — Telehealth: Payer: Self-pay | Admitting: Family Medicine

## 2022-07-23 MED ORDER — CARBIDOPA-LEVODOPA 25-100 MG PO TABS: ORAL_TABLET | ORAL | 0 refills | Status: DC

## 2022-07-23 MED ORDER — HYDROCHLOROTHIAZIDE 12.5 MG PO CAPS: 12.5000 mg | ORAL_CAPSULE | Freq: Every day | ORAL | 0 refills | Status: DC

## 2022-07-23 NOTE — Telephone Encounter (Signed)
hydrochlorothiazide (MICROZIDE) 12.5 MG capsule   [facility] [place], [place] Phone: [phone]  Fax: [phone]

## 2022-07-23 NOTE — Telephone Encounter (Signed)
Refill for Hydrochlorothiazide [facility] to [facility].

## 2022-07-25 DIAGNOSIS — C44219 Basal cell carcinoma of skin of left ear and external auricular canal: Secondary | ICD-10-CM | POA: Diagnosis not present

## 2022-08-04 ENCOUNTER — Other Ambulatory Visit: Payer: Self-pay | Admitting: Family Medicine

## 2022-08-04 DIAGNOSIS — N401 Enlarged prostate with lower urinary tract symptoms: Secondary | ICD-10-CM

## 2022-08-09 ENCOUNTER — Encounter: Payer: Self-pay | Admitting: Family Medicine

## 2022-08-09 ENCOUNTER — Ambulatory Visit (INDEPENDENT_AMBULATORY_CARE_PROVIDER_SITE_OTHER): Payer: Medicare Other | Admitting: Family Medicine

## 2022-08-09 VITALS — BP 118/78 | HR 76 | Temp 98.0°F | Ht 71.0 in | Wt 245.0 lb

## 2022-08-09 DIAGNOSIS — N401 Enlarged prostate with lower urinary tract symptoms: Secondary | ICD-10-CM | POA: Diagnosis not present

## 2022-08-09 DIAGNOSIS — J4540 Moderate persistent asthma, uncomplicated: Secondary | ICD-10-CM | POA: Diagnosis not present

## 2022-08-09 DIAGNOSIS — R739 Hyperglycemia, unspecified: Secondary | ICD-10-CM | POA: Diagnosis not present

## 2022-08-09 DIAGNOSIS — E785 Hyperlipidemia, unspecified: Secondary | ICD-10-CM | POA: Diagnosis not present

## 2022-08-09 DIAGNOSIS — R600 Localized edema: Secondary | ICD-10-CM

## 2022-08-09 DIAGNOSIS — K219 Gastro-esophageal reflux disease without esophagitis: Secondary | ICD-10-CM | POA: Diagnosis not present

## 2022-08-09 DIAGNOSIS — M5416 Radiculopathy, lumbar region: Secondary | ICD-10-CM

## 2022-08-09 DIAGNOSIS — G20B2 Parkinson's disease with dyskinesia, with fluctuations: Secondary | ICD-10-CM

## 2022-08-09 DIAGNOSIS — M159 Polyosteoarthritis, unspecified: Secondary | ICD-10-CM

## 2022-08-09 DIAGNOSIS — N138 Other obstructive and reflux uropathy: Secondary | ICD-10-CM | POA: Diagnosis not present

## 2022-08-09 LAB — CBC WITH DIFFERENTIAL/PLATELET
Basophils Absolute: 0 10*3/uL (ref 0.0–0.1)
Basophils Relative: 0.9 % (ref 0.0–3.0)
Eosinophils Absolute: 0.1 10*3/uL (ref 0.0–0.7)
Eosinophils Relative: 1.5 % (ref 0.0–5.0)
HCT: 42 % (ref 39.0–52.0)
Hemoglobin: 15 g/dL (ref 13.0–17.0)
Lymphocytes Relative: 20.3 % (ref 12.0–46.0)
Lymphs Abs: 1.1 10*3/uL (ref 0.7–4.0)
MCHC: 35.8 g/dL (ref 30.0–36.0)
MCV: 89.1 fl (ref 78.0–100.0)
Monocytes Absolute: 0.5 10*3/uL (ref 0.1–1.0)
Monocytes Relative: 8.6 % (ref 3.0–12.0)
Neutro Abs: 3.7 10*3/uL (ref 1.4–7.7)
Neutrophils Relative %: 68.7 % (ref 43.0–77.0)
Platelets: 203 10*3/uL (ref 150.0–400.0)
RBC: 4.72 Mil/uL (ref 4.22–5.81)
RDW: 13.9 % (ref 11.5–15.5)
WBC: 5.3 10*3/uL (ref 4.0–10.5)

## 2022-08-09 LAB — BASIC METABOLIC PANEL
BUN: 15 mg/dL (ref 6–23)
CO2: 29 mEq/L (ref 19–32)
Calcium: 9.3 mg/dL (ref 8.4–10.5)
Chloride: 100 mEq/L (ref 96–112)
Creatinine, Ser: 0.75 mg/dL (ref 0.40–1.50)
GFR: 91.28 mL/min (ref >60.00)
Glucose, Bld: 113 mg/dL — ABNORMAL HIGH (ref 70–99)
Potassium: 4 mEq/L (ref 3.5–5.1)
Sodium: 139 mEq/L (ref 135–145)

## 2022-08-09 LAB — LIPID PANEL
Cholesterol: 154 mg/dL (ref 0–200)
HDL: 39.2 mg/dL (ref >39.00)
NonHDL: 114.39
Total CHOL/HDL Ratio: 4
Triglycerides: 274 mg/dL — ABNORMAL HIGH (ref 0.0–149.0)
VLDL: 54.8 mg/dL — ABNORMAL HIGH (ref 0.0–40.0)

## 2022-08-09 LAB — HEPATIC FUNCTION PANEL
ALT: 8 U/L (ref 0–53)
AST: 17 U/L (ref 0–37)
Albumin: 4.2 g/dL (ref 3.5–5.2)
Alkaline Phosphatase: 89 U/L (ref 39–117)
Bilirubin, Direct: 0.3 mg/dL (ref 0.0–0.3)
Total Bilirubin: 2.4 mg/dL — ABNORMAL HIGH (ref 0.2–1.2)
Total Protein: 6.6 g/dL (ref 6.0–8.3)

## 2022-08-09 LAB — LDL CHOLESTEROL, DIRECT: Direct LDL: 74 mg/dL

## 2022-08-09 LAB — TSH: TSH: 2.04 u[IU]/mL (ref 0.35–5.50)

## 2022-08-09 LAB — HEMOGLOBIN A1C: Hgb A1c MFr Bld: 6 % (ref 4.6–6.5)

## 2022-08-09 LAB — PSA: PSA: 0.98 ng/mL (ref 0.10–4.00)

## 2022-08-09 MED ORDER — FLUTICASONE-SALMETEROL 230-21 MCG/ACT IN AERO: 2.0000 | INHALATION_SPRAY | Freq: Two times a day (BID) | RESPIRATORY_TRACT | 3 refills | Status: AC

## 2022-08-09 MED ORDER — FINASTERIDE 5 MG PO TABS: 5.0000 mg | ORAL_TABLET | Freq: Every day | ORAL | 3 refills | Status: DC

## 2022-08-09 MED ORDER — TAMSULOSIN HCL 0.4 MG PO CAPS: ORAL_CAPSULE | ORAL | 3 refills | Status: DC

## 2022-08-09 MED ORDER — ESOMEPRAZOLE MAGNESIUM 20 MG PO CPDR: 20.0000 mg | DELAYED_RELEASE_CAPSULE | Freq: Two times a day (BID) | ORAL | 3 refills | Status: AC

## 2022-08-09 MED ORDER — MELOXICAM 15 MG PO TABS: 15.0000 mg | ORAL_TABLET | Freq: Every day | ORAL | 3 refills | Status: DC

## 2022-08-09 NOTE — Progress Notes (Signed)
Subjective:    Patient ID: [name] [name], male    DOB: [birth year], 72 y.o.   MRN: [id number]  HPI Here to follow up on issues. His Parkinsons disease seems to be stable. He continues to exercise every day. He has a good bit of joint pain every day, and he takes 800 mg of Ibuprofen several times a day. His GERD and leg edema are stable. His asthma has been bothersome lately, especially when he spends time outdoors. He currently uses Advair 115-21 and albuterol.    Review of Systems  Constitutional: Negative.   HENT: Negative.    Eyes: Negative.   Respiratory:  Positive for shortness of breath.   Cardiovascular: Negative.   Gastrointestinal: Negative.   Genitourinary: Negative.   Musculoskeletal:  Positive for arthralgias and gait problem.  Skin: Negative.   Psychiatric/Behavioral: Negative.         Objective:   Physical Exam Constitutional:      General: He is not in acute distress.    Appearance: Normal appearance. He is well-developed. He is not diaphoretic.  HENT:     Head: Normocephalic and atraumatic.     Right Ear: External ear normal.     Left Ear: External ear normal.     Nose: Nose normal.     Mouth/Throat:     Pharynx: No oropharyngeal exudate.  Eyes:     General: No scleral icterus.       Right eye: No discharge.        Left eye: No discharge.     Conjunctiva/sclera: Conjunctivae normal.     Pupils: Pupils are equal, round, and reactive to light.  Neck:     Thyroid: No thyromegaly.     Vascular: No JVD.     Trachea: No tracheal deviation.  Cardiovascular:     Rate and Rhythm: Normal rate and regular rhythm.     Heart sounds: Normal heart sounds. No murmur heard.    No friction rub. No gallop.  Pulmonary:     Effort: Pulmonary effort is normal. No respiratory distress.     Breath sounds: Normal breath sounds. No wheezing or rales.  Chest:     Chest wall: No tenderness.  Abdominal:     General: Bowel sounds are normal. There is no distension.      Palpations: Abdomen is soft. There is no mass.     Tenderness: There is no abdominal tenderness. There is no guarding or rebound.  Genitourinary:    Penis: Normal. No tenderness.      Testes: Normal.  Musculoskeletal:        General: No tenderness. Normal range of motion.     Cervical back: Neck supple.  Lymphadenopathy:     Cervical: No cervical adenopathy.  Skin:    General: Skin is warm and dry.     Coloration: Skin is not pale.     Findings: No erythema or rash.  Neurological:     Mental Status: He is alert and oriented to person, place, and time.     Cranial Nerves: No cranial nerve deficit.     Motor: No abnormal muscle tone.     Coordination: Coordination normal.     Deep Tendon Reflexes: Reflexes are normal and symmetric. Reflexes normal.  Psychiatric:        Behavior: Behavior normal.        Thought Content: Thought content normal.        Judgment: Judgment normal.  Assessment & Plan:  His GERD and allergies are stable. His asthma has not been well controlled, so we will increase the Advair HFA to 230-21 BID. His Parkinsons is stable and he will follow up with Dr. Carles Collet. We will get fasting labs to check lipids, etc. For his OA, he will try Meloxicam 15 mg daily. We spent a total of ( 34  ) minutes reviewing records and discussing these issues.  [name], MD

## 2022-08-24 ENCOUNTER — Telehealth: Payer: Self-pay | Admitting: Neurology

## 2022-08-24 ENCOUNTER — Other Ambulatory Visit: Payer: Self-pay

## 2022-08-24 MED ORDER — PRAMIPEXOLE DIHYDROCHLORIDE ER 1.5 MG PO TB24: 1.5000 mg | ORAL_TABLET | Freq: Every day | ORAL | 0 refills | Status: DC

## 2022-08-24 NOTE — Telephone Encounter (Signed)
Patient needs a refill on his mirapex ER 1.64m  Sent to [facility]

## 2022-10-02 ENCOUNTER — Telehealth: Payer: Self-pay | Admitting: Family Medicine

## 2022-10-02 NOTE — Telephone Encounter (Signed)
Prescription Request  10/02/2022  LOV: 08/09/2022  What is the name of the medication or equipment?  finasteride (PROSCAR) 5 MG tablet  Have you contacted your pharmacy to request a refill? Yes    Sent to [place], [place] Phone: [phone]  Fax: [phone]      Patient notified that their request is being sent to the clinical staff for review and that they should receive a response within 2 business days.   Please advise at Mobile There is no such number on file (mobile).

## 2022-10-22 ENCOUNTER — Telehealth: Payer: Self-pay | Admitting: Family Medicine

## 2022-10-22 NOTE — Telephone Encounter (Signed)
Prescription Request  10/22/2022  LOV: 08/09/2022  What is the name of the medication or equipment?  hydrochlorothiazide (MICROZIDE) 12.5 MG capsule  Have you contacted your pharmacy to request a refill? No   Which pharmacy would you like this sent to?    [facility] - [place], [place] - [address] [place] [address] Phone: [phone] Fax: [phone]    Patient notified that their request is being sent to the clinical staff for review and that they should receive a response within 2 business days.   Please advise at Mobile There is no such number on file (mobile).

## 2022-10-22 NOTE — Telephone Encounter (Signed)
Left vm to schedule AWV appt

## 2022-10-24 MED ORDER — HYDROCHLOROTHIAZIDE 12.5 MG PO CAPS: 12.5000 mg | ORAL_CAPSULE | Freq: Every day | ORAL | 1 refills | Status: DC

## 2022-10-24 NOTE — Addendum Note (Signed)
Addended byConrad Bellwood D on: 10/24/2022 11:17 AM   Modules accepted: Orders

## 2022-10-24 NOTE — Telephone Encounter (Signed)
Rx sent 

## 2022-11-01 ENCOUNTER — Other Ambulatory Visit: Payer: Self-pay

## 2022-11-01 ENCOUNTER — Telehealth: Payer: Self-pay | Admitting: Family Medicine

## 2022-11-01 MED ORDER — ATORVASTATIN CALCIUM 20 MG PO TABS: 20.0000 mg | ORAL_TABLET | Freq: Every day | ORAL | 3 refills | Status: DC

## 2022-11-01 MED ORDER — POTASSIUM CHLORIDE ER 10 MEQ PO TBCR: EXTENDED_RELEASE_TABLET | ORAL | 1 refills | Status: DC

## 2022-11-01 NOTE — Telephone Encounter (Signed)
Pt Rx sent 

## 2022-11-01 NOTE — Telephone Encounter (Signed)
Prescription Request  11/01/2022  LOV: 08/09/2022  What is the name of the medication or equipment? potassium chloride (KLOR-CON) 10 MEQ tablet  atorvastatin (LIPITOR) 20 MG tablet  Have you contacted your pharmacy to request a refill? Yes   Which pharmacy would you like this sent to?   [facility] - [place], [place] - [address] [place] [address] Phone: [phone] Fax: [phone]    Patient notified that their request is being sent to the clinical staff for review and that they should receive a response within 2 business days.   Please advise at [facility]

## 2022-11-14 DIAGNOSIS — L570 Actinic keratosis: Secondary | ICD-10-CM | POA: Diagnosis not present

## 2022-11-14 DIAGNOSIS — Z85828 Personal history of other malignant neoplasm of skin: Secondary | ICD-10-CM | POA: Diagnosis not present

## 2022-11-14 DIAGNOSIS — L578 Other skin changes due to chronic exposure to nonionizing radiation: Secondary | ICD-10-CM | POA: Diagnosis not present

## 2022-11-14 DIAGNOSIS — D235 Other benign neoplasm of skin of trunk: Secondary | ICD-10-CM | POA: Diagnosis not present

## 2022-11-14 DIAGNOSIS — L905 Scar conditions and fibrosis of skin: Secondary | ICD-10-CM | POA: Diagnosis not present

## 2022-11-14 DIAGNOSIS — L821 Other seborrheic keratosis: Secondary | ICD-10-CM | POA: Diagnosis not present

## 2022-11-14 DIAGNOSIS — D1801 Hemangioma of skin and subcutaneous tissue: Secondary | ICD-10-CM | POA: Diagnosis not present

## 2022-11-30 ENCOUNTER — Telehealth: Payer: Self-pay | Admitting: Neurology

## 2022-11-30 DIAGNOSIS — G20A1 Parkinson's disease without dyskinesia, without mention of fluctuations: Secondary | ICD-10-CM

## 2022-11-30 MED ORDER — PRAMIPEXOLE DIHYDROCHLORIDE ER 1.5 MG PO TB24: 1.5000 mg | ORAL_TABLET | Freq: Every day | ORAL | 0 refills | Status: DC

## 2022-11-30 NOTE — Telephone Encounter (Signed)
1. Which medications need refilled? (List name and dosage, if known) Mirapex  2. Which pharmacy/location is medication to be sent to? (include street and city if local pharmacy) [facility]  3. Do they need a 30 day or 90 day supply? 90

## 2022-11-30 NOTE — Telephone Encounter (Signed)
Sent patient called

## 2022-12-05 ENCOUNTER — Telehealth: Payer: Self-pay | Admitting: Family Medicine

## 2022-12-05 NOTE — Telephone Encounter (Signed)
Prescription Request  12/05/2022  LOV: 08/09/2022  What is the name of the medication or equipment?     potassium chloride (KLOR-CON) 10 MEQ tablet  Have you contacted your pharmacy to request a refill? No   Which pharmacy would you like this sent to?  [facility] - [place], [place] - [address] [place] [address] Phone: [phone] Fax: [phone]    Patient notified that their request is being sent to the clinical staff for review and that they should receive a response within 2 business days.   Please advise at Mobile There is no such number on file (mobile).

## 2022-12-05 NOTE — Telephone Encounter (Signed)
Spoke with pt advised that Rx was sent in 11/01/22. Pt will call the pharmacy

## 2023-01-01 NOTE — Progress Notes (Unsigned)
Assessment/Plan:   1.  Parkinsons Disease  -Continue pramipexole ER, 1.5 mg daily.  He has had no compulsive behaviors or sleep attacks.  He is tolerating it well.  -Continue carbidopa/levodopa 25/100, 2 tablets at 6 AM/ 2 tablets at 10 AM/1 tablet at 3 PM  - He has seen derm.  He knows that Parkinsons Disease increases risk for melanoma.   2.  L shoulder pain  -awaiting sx with Dr. Rennis Chris Subjective:   [name] [name] was seen today in follow up for Parkinsons disease.  My previous records were reviewed prior to todays visit as well as outside records available to me.  Pt denies falls.  Pt denies lightheadedness, near syncope.  No hallucinations.  Mood has been good.  He is planning on having shoulder replacement on the L  Current prescribed movement disorder medications: Pramipexole ER, 1.5 mg daily Carbidopa/levodopa 25/100, 2/2/1    PREVIOUS MEDICATIONS: Sinemet and Mirapex  ALLERGIES:   Allergies  Allergen Reactions   Penicillins Rash and Other (See Comments)    Did it involve swelling of the face/tongue/throat, SOB, or low BP? No Did it involve sudden or severe rash/hives, skin peeling, or any reaction on the inside of your mouth or nose? No Did you need to seek medical attention at a hospital or doctor's office? No When did it last happen?      childhood allergy If all above answers are "NO", may proceed with cephalosporin use.      CURRENT MEDICATIONS:  Outpatient Encounter Medications as of 01/03/2023  Medication Sig   albuterol (VENTOLIN HFA) 108 (90 Base) MCG/ACT inhaler Inhale 2 puffs into the lungs every 4 (four) hours as needed for wheezing or shortness of breath.   atorvastatin (LIPITOR) 20 MG tablet Take 1 tablet (20 mg total) by mouth daily.   carbidopa-levodopa (SINEMET IR) 25-100 MG tablet 2 at 6 AM/ 2 at 10 AM/1 at 3 PM   esomeprazole (NEXIUM) 20 MG capsule Take 1 capsule (20 mg total) by mouth 2 (two) times daily before a meal.   finasteride  (PROSCAR) 5 MG tablet Take 1 tablet (5 mg total) by mouth daily.   fluticasone-salmeterol (ADVAIR HFA) 230-21 MCG/ACT inhaler Inhale 2 puffs into the lungs 2 (two) times daily.   hydrochlorothiazide (MICROZIDE) 12.5 MG capsule Take 1 capsule (12.5 mg total) by mouth daily.   polyethylene glycol (MIRALAX / GLYCOLAX) packet Take 8.5 g by mouth at bedtime.   potassium chloride (KLOR-CON) 10 MEQ tablet TAKE 1 TABLET(10 MEQ) BY MOUTH DAILY   Pramipexole Dihydrochloride (MIRAPEX ER) 1.5 MG TB24 Take 1 tablet (1.5 mg total) by mouth daily.   tamsulosin (FLOMAX) 0.4 MG CAPS capsule TAKE 1 CAPSULE(0.4 MG) BY MOUTH DAILY   [DISCONTINUED] meloxicam (MOBIC) 15 MG tablet Take 1 tablet (15 mg total) by mouth daily. (Patient not taking: Reported on 01/03/2023)   [DISCONTINUED] ondansetron (ZOFRAN) 8 MG tablet Take 1 tablet (8 mg total) by mouth every 8 (eight) hours as needed for nausea or vomiting. (Patient not taking: Reported on 08/09/2022)   No facility-administered encounter medications on file as of 01/03/2023.    Objective:   PHYSICAL EXAMINATION:    VITALS:   Vitals:   01/03/23 1013  BP: 138/88  Pulse: 81  SpO2: 96%  Weight: 251 lb 12.8 oz (114.2 kg)  Height: 6' (1.829 m)    GEN:  The patient appears stated age and is in NAD. HEENT:  Normocephalic, atraumatic. CV:  RRR Lungs:  CTAB Neck  no bruits   Neurological examination:  Orientation: The patient is alert and oriented x3. Cranial nerves: There is good facial symmetry with min facial hypomimia (lips parted). The speech is fluent and clear. Soft palate rises symmetrically and there is no tongue deviation. Hearing is intact to conversational tone. Sensation: Sensation is intact to light touch throughout Motor: Strength is 5/5 in the UE and at least antigravity in the LE  Movement examination: Tone: There is nl tone in the UE/LE Abnormal movements: there is no tremor today Coordination:  There is no decremation, with any form of  RAMS, including alternating supination and pronation of the forearm, hand opening and closing, finger taps, heel taps and toe taps.  Gait and Station: The patient pushes off to arise. The patient's stride length is good.  He is forward flexed at the waist (same as last visits)  I have reviewed and interpreted the following labs independently    Chemistry      Component Value Date/Time   NA 139 08/09/2022 0957   K 4.0 08/09/2022 0957   CL 100 08/09/2022 0957   CO2 29 08/09/2022 0957   BUN 15 08/09/2022 0957   CREATININE 0.75 08/09/2022 0957   CREATININE 0.65 (L) 02/24/2020 1208      Component Value Date/Time   CALCIUM 9.3 08/09/2022 0957   ALKPHOS 89 08/09/2022 0957   AST 17 08/09/2022 0957   ALT 8 08/09/2022 0957   BILITOT 2.4 (H) 08/09/2022 0957       Lab Results  Component Value Date   WBC 5.3 08/09/2022   HGB 15.0 08/09/2022   HCT 42.0 08/09/2022   MCV 89.1 08/09/2022   PLT 203.0 08/09/2022    Lab Results  Component Value Date   TSH 2.04 08/09/2022     Cc:  Nelwyn Salisbury, MD

## 2023-01-03 ENCOUNTER — Ambulatory Visit (INDEPENDENT_AMBULATORY_CARE_PROVIDER_SITE_OTHER): Payer: Medicare Other | Admitting: Neurology

## 2023-01-03 ENCOUNTER — Encounter: Payer: Self-pay | Admitting: Neurology

## 2023-01-03 VITALS — BP 138/88 | HR 81 | Ht 72.0 in | Wt 251.8 lb

## 2023-01-03 DIAGNOSIS — G20A1 Parkinson's disease without dyskinesia, without mention of fluctuations: Secondary | ICD-10-CM | POA: Diagnosis not present

## 2023-01-03 MED ORDER — CARBIDOPA-LEVODOPA 25-100 MG PO TABS: ORAL_TABLET | ORAL | 1 refills | Status: DC

## 2023-01-03 NOTE — Patient Instructions (Signed)
The physicians and staff at [facility] are committed to providing excellent care. You may receive a survey requesting feedback about your experience at our office. We strive to receive "very good" responses to the survey questions. If you feel that your experience would prevent you from giving the office a "very good " response, please contact our office to try to remedy the situation. We may be reached at [phone]. Thank you for taking the time out of your busy day to complete the survey.  

## 2023-01-30 ENCOUNTER — Ambulatory Visit (INDEPENDENT_AMBULATORY_CARE_PROVIDER_SITE_OTHER): Payer: Medicare Other | Admitting: Family Medicine

## 2023-01-30 ENCOUNTER — Encounter: Payer: Self-pay | Admitting: Family Medicine

## 2023-01-30 VITALS — BP 118/80 | HR 80 | Temp 97.9°F | Wt 252.0 lb

## 2023-01-30 DIAGNOSIS — H1033 Unspecified acute conjunctivitis, bilateral: Secondary | ICD-10-CM | POA: Diagnosis not present

## 2023-01-30 DIAGNOSIS — G8929 Other chronic pain: Secondary | ICD-10-CM | POA: Diagnosis not present

## 2023-01-30 DIAGNOSIS — M25512 Pain in left shoulder: Secondary | ICD-10-CM

## 2023-01-30 DIAGNOSIS — E663 Overweight: Secondary | ICD-10-CM

## 2023-01-30 MED ORDER — PHENTERMINE HCL 37.5 MG PO CAPS: 37.5000 mg | ORAL_CAPSULE | ORAL | 1 refills | Status: DC

## 2023-01-30 MED ORDER — TOBRAMYCIN-DEXAMETHASONE 0.3-0.1 % OP SUSP: 2.0000 [drp] | OPHTHALMIC | 1 refills | Status: DC

## 2023-01-30 NOTE — Progress Notes (Signed)
   Subjective:    Patient ID: [name] [name], male    DOB: 12/23/1950, 72 y.o.   MRN: [id number]  HPI Here for several issues. 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 Rennis [name] for this. This is now to the point that [name] [name] want to have replacement surgery. He will need [place] to do a referral. Second for th epast 2 weeks he has had redness, itching, and DC in both eyes. No other URI symptoms. Third he has been trying to lose weight and he eats healthy meals. The problem is he snacks a lot late at night, and he asks for help.    Review of Systems  Constitutional: Negative.   HENT: Negative.    Eyes:  Positive for discharge, redness and itching. Negative for photophobia and visual disturbance.  Respiratory: Negative.    Cardiovascular: Negative.   Musculoskeletal:  Positive for arthralgias.       Objective:   Physical Exam Constitutional:      Appearance: Normal appearance. He is not ill-appearing.  HENT:     Right Ear: Tympanic membrane, ear canal and external ear normal.     Left Ear: Tympanic membrane, ear canal and external ear normal.     Nose: Nose normal.     Mouth/Throat:     Pharynx: Oropharynx is clear.  Eyes:     Comments: Both conjunctivae are red with some matting in the corners   Cardiovascular:     Rate and Rhythm: Normal rate and regular rhythm.     Pulses: Normal pulses.     Heart sounds: Normal heart sounds.  Pulmonary:     Effort: Pulmonary effort is normal.     Breath sounds: Normal breath sounds.  Neurological:     Mental Status: He is alert.           Assessment & Plan:  He has conjunctivitis, and we will treat this with Tobradex drops. We will refer him to Dr. Rennis [name] for the left shoulder pain, so they can work towards a surgery. We will start hi on Phentermine every morning to help lose weight.  [name] Crane, MD

## 2023-02-07 DIAGNOSIS — M1712 Unilateral primary osteoarthritis, left knee: Secondary | ICD-10-CM | POA: Diagnosis not present

## 2023-02-11 ENCOUNTER — Telehealth: Payer: Self-pay | Admitting: Neurology

## 2023-02-11 DIAGNOSIS — G20A1 Parkinson's disease without dyskinesia, without mention of fluctuations: Secondary | ICD-10-CM

## 2023-02-11 MED ORDER — PRAMIPEXOLE DIHYDROCHLORIDE ER 1.5 MG PO TB24: 1.5000 mg | ORAL_TABLET | Freq: Every day | ORAL | 1 refills | Status: DC

## 2023-02-11 NOTE — Telephone Encounter (Signed)
Refill sent in for pt. 

## 2023-02-11 NOTE — Telephone Encounter (Signed)
Pt is calling to get a refill of Rx Pramipexole Dihydrochloride (MIRAPEX ER) 1.5 MG to be sent to [facility].

## 2023-02-19 DIAGNOSIS — M1712 Unilateral primary osteoarthritis, left knee: Secondary | ICD-10-CM | POA: Diagnosis not present

## 2023-02-20 ENCOUNTER — Telehealth: Payer: Self-pay | Admitting: Family Medicine

## 2023-02-20 ENCOUNTER — Telehealth: Payer: Self-pay | Admitting: Neurology

## 2023-02-20 NOTE — Telephone Encounter (Signed)
Pt called in to let Dr. Arbutus Leas know that he blew out his L knee and the surgeons office Dr. Orson Ape will be getting in contact with her.

## 2023-02-20 NOTE — Telephone Encounter (Signed)
Pt call and want dr. Clent Ridges to know that dr.Roland is going to call him about a knee replacement.

## 2023-02-26 ENCOUNTER — Telehealth: Payer: Self-pay | Admitting: Family Medicine

## 2023-02-26 NOTE — Telephone Encounter (Signed)
ERROR PLEASE DISREGARD

## 2023-03-06 DIAGNOSIS — M25662 Stiffness of left knee, not elsewhere classified: Secondary | ICD-10-CM | POA: Diagnosis not present

## 2023-03-06 DIAGNOSIS — M1712 Unilateral primary osteoarthritis, left knee: Secondary | ICD-10-CM | POA: Diagnosis not present

## 2023-03-06 DIAGNOSIS — M25562 Pain in left knee: Secondary | ICD-10-CM | POA: Diagnosis not present

## 2023-03-06 DIAGNOSIS — R531 Weakness: Secondary | ICD-10-CM | POA: Diagnosis not present

## 2023-03-22 DIAGNOSIS — M1712 Unilateral primary osteoarthritis, left knee: Secondary | ICD-10-CM | POA: Diagnosis not present

## 2023-03-22 DIAGNOSIS — M25762 Osteophyte, left knee: Secondary | ICD-10-CM | POA: Diagnosis not present

## 2023-03-22 DIAGNOSIS — G8918 Other acute postprocedural pain: Secondary | ICD-10-CM | POA: Diagnosis not present

## 2023-03-22 DIAGNOSIS — Z96652 Presence of left artificial knee joint: Secondary | ICD-10-CM | POA: Diagnosis not present

## 2023-03-22 HISTORY — PX: TOTAL KNEE ARTHROPLASTY: SHX125

## 2023-03-25 DIAGNOSIS — Z79891 Long term (current) use of opiate analgesic: Secondary | ICD-10-CM | POA: Diagnosis not present

## 2023-03-25 DIAGNOSIS — Z96653 Presence of artificial knee joint, bilateral: Secondary | ICD-10-CM | POA: Diagnosis not present

## 2023-03-25 DIAGNOSIS — Z7901 Long term (current) use of anticoagulants: Secondary | ICD-10-CM | POA: Diagnosis not present

## 2023-03-25 DIAGNOSIS — G20A1 Parkinson's disease without dyskinesia, without mention of fluctuations: Secondary | ICD-10-CM | POA: Diagnosis not present

## 2023-03-25 DIAGNOSIS — I10 Essential (primary) hypertension: Secondary | ICD-10-CM | POA: Diagnosis not present

## 2023-03-25 DIAGNOSIS — I890 Lymphedema, not elsewhere classified: Secondary | ICD-10-CM | POA: Diagnosis not present

## 2023-03-25 DIAGNOSIS — Z471 Aftercare following joint replacement surgery: Secondary | ICD-10-CM | POA: Diagnosis not present

## 2023-03-25 DIAGNOSIS — N400 Enlarged prostate without lower urinary tract symptoms: Secondary | ICD-10-CM | POA: Diagnosis not present

## 2023-03-26 DIAGNOSIS — I890 Lymphedema, not elsewhere classified: Secondary | ICD-10-CM | POA: Diagnosis not present

## 2023-03-26 DIAGNOSIS — G20A1 Parkinson's disease without dyskinesia, without mention of fluctuations: Secondary | ICD-10-CM | POA: Diagnosis not present

## 2023-03-26 DIAGNOSIS — N400 Enlarged prostate without lower urinary tract symptoms: Secondary | ICD-10-CM | POA: Diagnosis not present

## 2023-03-26 DIAGNOSIS — Z96653 Presence of artificial knee joint, bilateral: Secondary | ICD-10-CM | POA: Diagnosis not present

## 2023-03-26 DIAGNOSIS — Z471 Aftercare following joint replacement surgery: Secondary | ICD-10-CM | POA: Diagnosis not present

## 2023-03-26 DIAGNOSIS — I10 Essential (primary) hypertension: Secondary | ICD-10-CM | POA: Diagnosis not present

## 2023-03-27 DIAGNOSIS — Z471 Aftercare following joint replacement surgery: Secondary | ICD-10-CM | POA: Diagnosis not present

## 2023-03-27 DIAGNOSIS — Z96653 Presence of artificial knee joint, bilateral: Secondary | ICD-10-CM | POA: Diagnosis not present

## 2023-03-27 DIAGNOSIS — N400 Enlarged prostate without lower urinary tract symptoms: Secondary | ICD-10-CM | POA: Diagnosis not present

## 2023-03-27 DIAGNOSIS — I890 Lymphedema, not elsewhere classified: Secondary | ICD-10-CM | POA: Diagnosis not present

## 2023-03-27 DIAGNOSIS — G20A1 Parkinson's disease without dyskinesia, without mention of fluctuations: Secondary | ICD-10-CM | POA: Diagnosis not present

## 2023-03-27 DIAGNOSIS — I10 Essential (primary) hypertension: Secondary | ICD-10-CM | POA: Diagnosis not present

## 2023-03-28 ENCOUNTER — Telehealth: Payer: Self-pay | Admitting: Family Medicine

## 2023-03-28 DIAGNOSIS — M1712 Unilateral primary osteoarthritis, left knee: Secondary | ICD-10-CM | POA: Diagnosis not present

## 2023-03-28 NOTE — Telephone Encounter (Signed)
Prescription Request  03/28/2023  LOV: 01/30/2023  What is the name of the medication or equipment? hydrochlorothiazide (MICROZIDE) 12.5 MG capsule  Have you contacted your pharmacy to request a refill? Yes    Which pharmacy would you like this sent to?  [facility] - [place], [place] - [address] [place] [address] Phone: [phone] Fax: [phone]    Patient notified that their request is being sent to the clinical staff for review and that they should receive a response within 2 business days.   Please advise at

## 2023-03-29 ENCOUNTER — Other Ambulatory Visit: Payer: Self-pay

## 2023-03-29 DIAGNOSIS — I10 Essential (primary) hypertension: Secondary | ICD-10-CM | POA: Diagnosis not present

## 2023-03-29 DIAGNOSIS — Z471 Aftercare following joint replacement surgery: Secondary | ICD-10-CM | POA: Diagnosis not present

## 2023-03-29 DIAGNOSIS — N400 Enlarged prostate without lower urinary tract symptoms: Secondary | ICD-10-CM | POA: Diagnosis not present

## 2023-03-29 DIAGNOSIS — I890 Lymphedema, not elsewhere classified: Secondary | ICD-10-CM | POA: Diagnosis not present

## 2023-03-29 DIAGNOSIS — Z96653 Presence of artificial knee joint, bilateral: Secondary | ICD-10-CM | POA: Diagnosis not present

## 2023-03-29 DIAGNOSIS — G20A1 Parkinson's disease without dyskinesia, without mention of fluctuations: Secondary | ICD-10-CM | POA: Diagnosis not present

## 2023-03-29 MED ORDER — HYDROCHLOROTHIAZIDE 12.5 MG PO CAPS: 12.5000 mg | ORAL_CAPSULE | Freq: Every day | ORAL | 1 refills | Status: DC

## 2023-03-29 NOTE — Telephone Encounter (Signed)
Rx sent 

## 2023-04-01 DIAGNOSIS — Z471 Aftercare following joint replacement surgery: Secondary | ICD-10-CM | POA: Diagnosis not present

## 2023-04-01 DIAGNOSIS — I10 Essential (primary) hypertension: Secondary | ICD-10-CM | POA: Diagnosis not present

## 2023-04-01 DIAGNOSIS — Z96653 Presence of artificial knee joint, bilateral: Secondary | ICD-10-CM | POA: Diagnosis not present

## 2023-04-01 DIAGNOSIS — N400 Enlarged prostate without lower urinary tract symptoms: Secondary | ICD-10-CM | POA: Diagnosis not present

## 2023-04-01 DIAGNOSIS — G20A1 Parkinson's disease without dyskinesia, without mention of fluctuations: Secondary | ICD-10-CM | POA: Diagnosis not present

## 2023-04-01 DIAGNOSIS — I890 Lymphedema, not elsewhere classified: Secondary | ICD-10-CM | POA: Diagnosis not present

## 2023-04-02 ENCOUNTER — Encounter: Payer: Self-pay | Admitting: Family Medicine

## 2023-04-02 ENCOUNTER — Ambulatory Visit (INDEPENDENT_AMBULATORY_CARE_PROVIDER_SITE_OTHER): Payer: Medicare Other | Admitting: Family Medicine

## 2023-04-02 VITALS — BP 110/76 | HR 107 | Temp 98.5°F | Wt 249.4 lb

## 2023-04-02 DIAGNOSIS — L03116 Cellulitis of left lower limb: Secondary | ICD-10-CM | POA: Insufficient documentation

## 2023-04-02 MED ORDER — DOXYCYCLINE HYCLATE 100 MG PO TABS: 100.0000 mg | ORAL_TABLET | Freq: Two times a day (BID) | ORAL | 0 refills | Status: DC

## 2023-04-02 NOTE — Progress Notes (Signed)
Subjective:    Patient ID: [name] [name], male    DOB: January 06, 1951, 72 y.o.   MRN: [id number]  HPI Here for cellulitis in the left leg. Several weeks ago he stepped off a step awkwardly  and he tore up the cartilage in the left knee. 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 Turner Daniels, and apparently the surgery went well. He was started on Eliquis for prophylaxis. However a week ago his left lower leg began to swell more than normal, the skin became red and warm, and the leg is tender. No fever. He saw Dr. Turner Daniels 2 days ago, and he was started on 7 days of Clindamycin 300 mg QID. Today he fels the same. Of note we struggled with a similar cellulitis in the right leg 3 years ago, and after several months we were finally able to get this to resolve by using a combination of Clindamycin and Doxycycline. [name] [name] is taking Oxycodone for pain.    Review of Systems  Constitutional: Negative.   Respiratory: Negative.    Cardiovascular:  Positive for leg swelling. Negative for chest pain and palpitations.  Musculoskeletal:  Positive for arthralgias.       Objective:   Physical Exam Constitutional:      Comments: In pain, walks with a cane   Cardiovascular:     Rate and Rhythm: Normal rate and regular rhythm.     Pulses: Normal pulses.     Heart sounds: Normal heart sounds.  Pulmonary:     Effort: Pulmonary effort is normal.     Breath sounds: Normal breath sounds.  Musculoskeletal:     Comments: The right lower leg has 3+ edema and the left lower leg has 4+ edema. Also the lower left leg is red, warm, and tender  Neurological:     Mental Status: He is alert.           Assessment & Plan:  He has another case of post-surgical cellulitis. He will continue taking the Clindamycin and we will add Doxycycline 100 mg BID to it. He will follow up with [place] in one week.  Gershon Crane, MD

## 2023-04-05 ENCOUNTER — Telehealth: Payer: Self-pay | Admitting: Family Medicine

## 2023-04-05 NOTE — Telephone Encounter (Signed)
FYI Spoke with pt stated that she wanted to update Dr Clent Ridges regarding the cellulitis, pt advised that Dr Clent Ridges was gone for the weekend, pt has appointment scheduled for next week but due to the weekend pt was advised to go to the ED if not better. Pt verbalized understanding

## 2023-04-05 NOTE — Telephone Encounter (Signed)
Pt called requesting to speak to MD. Pt was asked if it was regarding a medication refill, as I could help with that. Pt stated it is regarding cellulitis and really needs to speak to MD.  Pt asked that MD please call back, as soon as possible.

## 2023-04-08 ENCOUNTER — Other Ambulatory Visit: Payer: Self-pay

## 2023-04-08 ENCOUNTER — Telehealth: Payer: Self-pay | Admitting: Family Medicine

## 2023-04-08 MED ORDER — CLINDAMYCIN HCL 300 MG PO CAPS: 300.0000 mg | ORAL_CAPSULE | Freq: Four times a day (QID) | ORAL | 0 refills | Status: DC

## 2023-04-08 MED ORDER — DOXYCYCLINE HYCLATE 100 MG PO TABS: 100.0000 mg | ORAL_TABLET | Freq: Two times a day (BID) | ORAL | 0 refills | Status: DC

## 2023-04-08 NOTE — Telephone Encounter (Signed)
Spouse calling pleading that this be sent in asap. Advised to allow time as provider is seeing scheduled patients today.

## 2023-04-08 NOTE — Telephone Encounter (Signed)
Pt both Rx sent to the pharmacy, pt notified

## 2023-04-08 NOTE — Telephone Encounter (Signed)
Pt wife is calling and pt did not go to er and has and appt tomorrow. Pt wife is requesting refill on clindamycin (CLEOCIN) 300 MG capsule , doxycycline (VIBRA-TABS) 100 MG tablet  [facility] [phone] - [place], [place] - [address] AT [facility] [address] Phone: [phone]  Fax: [phone]

## 2023-04-08 NOTE — Telephone Encounter (Signed)
Call in 10 day refills for both

## 2023-04-09 ENCOUNTER — Encounter: Payer: Self-pay | Admitting: Family Medicine

## 2023-04-09 ENCOUNTER — Ambulatory Visit: Payer: Medicare Other | Admitting: Family Medicine

## 2023-04-09 VITALS — BP 108/78 | HR 106 | Temp 98.7°F | Wt 243.0 lb

## 2023-04-09 DIAGNOSIS — M792 Neuralgia and neuritis, unspecified: Secondary | ICD-10-CM | POA: Diagnosis not present

## 2023-04-09 DIAGNOSIS — R600 Localized edema: Secondary | ICD-10-CM

## 2023-04-09 DIAGNOSIS — L03116 Cellulitis of left lower limb: Secondary | ICD-10-CM | POA: Diagnosis not present

## 2023-04-09 MED ORDER — OXYCODONE HCL 10 MG PO TABS: 10.0000 mg | ORAL_TABLET | Freq: Four times a day (QID) | ORAL | 0 refills | Status: DC | PRN

## 2023-04-09 MED ORDER — GABAPENTIN 100 MG PO CAPS: 100.0000 mg | ORAL_CAPSULE | Freq: Three times a day (TID) | ORAL | 3 refills | Status: DC

## 2023-04-09 NOTE — Progress Notes (Signed)
Subjective:    Patient ID: [name] [name], male    DOB: 17-Nov-1950, 72 y.o.   MRN: [id number]  HPI Here to follow up on cellulitis in the left leg. When we saw him a week ago we added Doxycycline to the Clindamycin, and this seems to be working much better. The swelling and the redness have diminished a little. He still suffers from intermittent sharp pains down the leg however. Sometimes the pains feel like pins and needles, and sometimes they have a burning quality. He is taking Oxycodone 5 mg with no relief.    Review of Systems  Constitutional: Negative.   Respiratory: Negative.    Cardiovascular:  Positive for leg swelling. Negative for chest pain and palpitations.       Objective:   Physical Exam Constitutional:      Appearance: Normal appearance.     Comments: Walks with a cane   Cardiovascular:     Rate and Rhythm: Normal rate and regular rhythm.     Pulses: Normal pulses.     Heart sounds: Normal heart sounds.  Pulmonary:     Effort: Pulmonary effort is normal.     Breath sounds: Normal breath sounds.  Musculoskeletal:     Comments: 3+ edema in the right lower leg and 4+ edema in the left lower leg. The left leg erythema has faded a bit, and the leg is no longer warm or tender.   Neurological:     Mental Status: He is alert.           Assessment & Plan:  Partially treated cellulitis. He will continue taking Doxycycline and Clindamycin. We will increase the Oxycodone to 10 mg as needed for neuropathic pain, and we will add Gabapentin 100 mg TID. Recheck in one week.  Gershon Crane, MD

## 2023-04-13 ENCOUNTER — Encounter: Payer: Self-pay | Admitting: Family Medicine

## 2023-04-16 NOTE — Telephone Encounter (Signed)
We can address these questions at his next OV. In the meantime I am pleased he is getting some slow improvement

## 2023-04-17 ENCOUNTER — Ambulatory Visit (INDEPENDENT_AMBULATORY_CARE_PROVIDER_SITE_OTHER): Payer: Medicare Other | Admitting: Family Medicine

## 2023-04-17 ENCOUNTER — Encounter: Payer: Self-pay | Admitting: Family Medicine

## 2023-04-17 VITALS — BP 110/78 | HR 105 | Temp 98.3°F | Wt 243.0 lb

## 2023-04-17 DIAGNOSIS — L03119 Cellulitis of unspecified part of limb: Secondary | ICD-10-CM | POA: Diagnosis not present

## 2023-04-17 DIAGNOSIS — L02419 Cutaneous abscess of limb, unspecified: Secondary | ICD-10-CM | POA: Diagnosis not present

## 2023-04-17 MED ORDER — ATORVASTATIN CALCIUM 20 MG PO TABS: 20.0000 mg | ORAL_TABLET | Freq: Every day | ORAL | 3 refills | Status: DC

## 2023-04-17 MED ORDER — ELIQUIS 2.5 MG PO TABS: 2.5000 mg | ORAL_TABLET | Freq: Two times a day (BID) | ORAL | 3 refills | Status: DC

## 2023-04-17 MED ORDER — CLINDAMYCIN HCL 300 MG PO CAPS: 300.0000 mg | ORAL_CAPSULE | Freq: Four times a day (QID) | ORAL | 5 refills | Status: DC

## 2023-04-17 MED ORDER — DOXYCYCLINE HYCLATE 100 MG PO TABS: 100.0000 mg | ORAL_TABLET | Freq: Two times a day (BID) | ORAL | 5 refills | Status: DC

## 2023-04-17 NOTE — Progress Notes (Signed)
Subjective:    Patient ID: [name] [name], male    DOB: 1950/11/26, 72 y.o.   MRN: [id number]  HPI Here to follow up on cellulitis in the legs. He is taking Clindamycin and Doxycycline, and he thinks he is very slowly improving. The swelling and redness has lessened. His pain has also lessened, and after starting on Gabapentin he was able to stop the Oxycodone.    Review of Systems  Constitutional: Negative.   Respiratory: Negative.    Cardiovascular:  Positive for leg swelling.       Objective:   Physical Exam Constitutional:      Appearance: Normal appearance.  Cardiovascular:     Rate and Rhythm: Normal rate and regular rhythm.     Pulses: Normal pulses.     Heart sounds: Normal heart sounds.  Pulmonary:     Effort: Pulmonary effort is normal.     Breath sounds: Normal breath sounds.  Musculoskeletal:     Comments: Both lower legs are swollen, but not quite as much as last time. The erythema has lessened a little. The tenderness has been reduced.   Neurological:     Mental Status: He is alert.           Assessment & Plan:  Leg cellulitis. We will continue on the current regimen and we will see him in 2 weeks.  Gershon Crane, MD

## 2023-04-22 ENCOUNTER — Telehealth: Payer: Self-pay | Admitting: Family Medicine

## 2023-04-22 NOTE — Telephone Encounter (Signed)
Pt has refills however its to soon due to pt has increase medication  Take 200 mg by mouth 3 (three) times daily. Also pt would like to know if there is cream or something he can put on his skin  [facility] [phone] - [place], [place] - [address] AT [facility] [address] Phone: [phone]  Fax: [phone]

## 2023-04-25 MED ORDER — SILVER SULFADIAZINE 1 % EX CREA: 1.0000 | TOPICAL_CREAM | Freq: Every day | CUTANEOUS | 5 refills | Status: DC

## 2023-04-25 MED ORDER — GABAPENTIN 100 MG PO CAPS: 200.0000 mg | ORAL_CAPSULE | Freq: Three times a day (TID) | ORAL | 5 refills | Status: DC

## 2023-04-25 NOTE — Telephone Encounter (Signed)
Pt.notified

## 2023-04-25 NOTE — Telephone Encounter (Signed)
I sent in more Gabapentin to reflect the new higher dose, and I sent in Silvadene cream to be applied once daily

## 2023-05-01 ENCOUNTER — Ambulatory Visit: Payer: Medicare Other | Admitting: Family Medicine

## 2023-05-01 ENCOUNTER — Encounter: Payer: Self-pay | Admitting: Family Medicine

## 2023-05-01 VITALS — BP 118/80 | HR 101 | Temp 98.2°F | Wt 251.0 lb

## 2023-05-01 DIAGNOSIS — L02419 Cutaneous abscess of limb, unspecified: Secondary | ICD-10-CM | POA: Diagnosis not present

## 2023-05-01 DIAGNOSIS — L03119 Cellulitis of unspecified part of limb: Secondary | ICD-10-CM | POA: Diagnosis not present

## 2023-05-01 MED ORDER — GABAPENTIN 100 MG PO CAPS: 100.0000 mg | ORAL_CAPSULE | Freq: Three times a day (TID) | ORAL | Status: DC

## 2023-05-01 MED ORDER — ELIQUIS 2.5 MG PO TABS: 2.5000 mg | ORAL_TABLET | Freq: Two times a day (BID) | ORAL | 3 refills | Status: DC

## 2023-05-01 NOTE — Progress Notes (Signed)
Subjective:    Patient ID: [name] [name], male    DOB: 11/04/1950, 72 y.o.   MRN: [id number]  HPI Here to follow up on cellulitis of the legs. He is taking Doxycycline and Clindamycin, and he is applying Silvadene cream. He says he is making slow but steady progress. The swelling is down a little more, so that he can wear shoes again. He is using a cane rather than the walker. His pain levels have decreased as well.    Review of Systems  Constitutional: Negative.   Respiratory: Negative.    Cardiovascular:  Positive for leg swelling.  Skin:  Positive for color change.       Objective:   Physical Exam Constitutional:      Appearance: Normal appearance.  Cardiovascular:     Rate and Rhythm: Normal rate and regular rhythm.     Pulses: Normal pulses.     Heart sounds: Normal heart sounds.  Pulmonary:     Effort: Pulmonary effort is normal.     Breath sounds: Normal breath sounds.  Musculoskeletal:     Comments: Both legs have 4+ edema, but this appears to be slightly reduced from the last visit. The erythema is not as intense. There is no warmth. They are only mildly tender  Neurological:     Mental Status: He is alert.           Assessment & Plan:  His leg cellulitis is slowly getting under control. We will stop the Silvadene cream. He will decrease the Gabapentin to 100 mg TID. Stay on Doxycycline and Clindamycin. Recheck in 4 weeks.  Gershon Crane, MD

## 2023-05-07 ENCOUNTER — Encounter: Payer: Self-pay | Admitting: Family Medicine

## 2023-05-07 ENCOUNTER — Ambulatory Visit: Payer: Medicare Other | Admitting: Family Medicine

## 2023-05-07 VITALS — BP 108/68 | HR 99 | Temp 98.1°F | Wt 254.0 lb

## 2023-05-07 DIAGNOSIS — L03119 Cellulitis of unspecified part of limb: Secondary | ICD-10-CM

## 2023-05-07 DIAGNOSIS — L02419 Cutaneous abscess of limb, unspecified: Secondary | ICD-10-CM | POA: Diagnosis not present

## 2023-05-07 MED ORDER — CEFTRIAXONE SODIUM 1 G IJ SOLR
1.0000 g | Freq: Once | INTRAMUSCULAR | Status: AC
Start: 2023-05-07 — End: 2023-05-07
  Administered 2023-05-07: 1 g via INTRAMUSCULAR

## 2023-05-07 NOTE — Progress Notes (Signed)
Subjective:    Patient ID: [name] [name], male    DOB: 1950/12/14, 72 y.o.   MRN: [id number]  HPI Here to follow up on cellulitis in both lower legs. He has been taking Clindamycin and Doxycycline. He seemed to be improving the last time we saw him, but in the last week it has gotten worse again. The swelling and redness have increased. Still no fever.    Review of Systems  Constitutional: Negative.   Respiratory: Negative.    Cardiovascular:  Positive for leg swelling.       Objective:   Physical Exam Constitutional:      General: He is not in acute distress. Cardiovascular:     Rate and Rhythm: Normal rate and regular rhythm.     Pulses: Normal pulses.     Heart sounds: Normal heart sounds.  Pulmonary:     Effort: Pulmonary effort is normal.     Breath sounds: Normal breath sounds.  Musculoskeletal:     Comments: 4+ edema of both lower legs. Both are warm, red, and tender with some bullae forming.   Neurological:     Mental Status: He is alert.           Assessment & Plan:  Cellulitis, he is given a shot of Rocephin. We will also refer him urgently to [facility] to [facility].  Gershon Crane, MD

## 2023-05-07 NOTE — Addendum Note (Signed)
Addended by: Carola Rhine on: 05/07/2023 04:22 PM   Modules accepted: Orders

## 2023-05-09 ENCOUNTER — Encounter: Payer: Self-pay | Admitting: Family Medicine

## 2023-05-10 NOTE — Telephone Encounter (Signed)
FYI

## 2023-05-10 NOTE — Telephone Encounter (Signed)
Noted  

## 2023-05-22 DIAGNOSIS — I87391 Chronic venous hypertension (idiopathic) with other complications of right lower extremity: Secondary | ICD-10-CM | POA: Diagnosis not present

## 2023-05-22 DIAGNOSIS — I872 Venous insufficiency (chronic) (peripheral): Secondary | ICD-10-CM | POA: Diagnosis not present

## 2023-05-27 ENCOUNTER — Telehealth: Payer: Self-pay | Admitting: Family Medicine

## 2023-05-27 NOTE — Telephone Encounter (Addendum)
Spouse called to inform MD that Pt has been diagnosed with ENV, and she wanted to ask if MD received the after visit summary from Dr. Charm Barges [facility])? [phone]

## 2023-05-28 ENCOUNTER — Encounter: Payer: Medicare Other | Attending: Physician Assistant | Admitting: Physician Assistant

## 2023-05-28 ENCOUNTER — Ambulatory Visit: Payer: Medicare Other | Admitting: Physician Assistant

## 2023-05-28 DIAGNOSIS — G20A1 Parkinson's disease without dyskinesia, without mention of fluctuations: Secondary | ICD-10-CM | POA: Diagnosis not present

## 2023-05-28 DIAGNOSIS — L97812 Non-pressure chronic ulcer of other part of right lower leg with fat layer exposed: Secondary | ICD-10-CM | POA: Insufficient documentation

## 2023-05-28 DIAGNOSIS — I890 Lymphedema, not elsewhere classified: Secondary | ICD-10-CM | POA: Insufficient documentation

## 2023-05-28 NOTE — Progress Notes (Signed)
[name], [name] ([id number]) [phone] [email or web address] Page 1 of 5 Visit Report for 05/28/2023 Abuse Risk Screen Details Patient Name: Date of Service: [name] [name], [name] [name] 05/28/2023 8:15 A M Medical Record Number: [id number] Patient Account Number: [id number] Date of Birth/Sex: Treating RN: December 26, 1950 (72 y.o. [name] Primary Care Alexx Mcburney: Gershon Crane Other Clinician: Referring Devetta Hagenow: Treating Dayona Shaheen/Extender: Harvel Ricks in Treatment: 0 Abuse Risk Screen Items Answer ABUSE RISK SCREEN: Has anyone close to you tried to hurt or harm you recentlyo No Do you feel uncomfortable with anyone in your familyo No Has anyone forced you do things that you didnt want to doo No Electronic Signature(s) Signed: 05/28/2023 4:21:31 PM By: Angelina Pih Entered By: Angelina Pih on 05/28/2023 05:38:51 -------------------------------------------------------------------------------- Activities of Daily Living Details Patient Name: Date of Service: [name], [name] 05/28/2023 8:15 A M Medical Record Number: [id number] Patient Account Number: [id number] Date of Birth/Sex: Treating RN: 08/30/1950 (72 y.o. [name] Primary Care Nafis Farnan: Gershon Crane Other Clinician: Referring Cydne Grahn: Treating Chieko Neises/Extender: Harvel Ricks in Treatment: 0 Activities of Daily Living Items Answer Activities of Daily Living (Please select one for each item) Drive Automobile Completely Able T Medications ake Completely Able Use T elephone Completely Able Care for Appearance Completely Able Use T oilet Completely Able Bath / Shower Completely Able Dress Self Completely Able Feed Self Completely Able Walk Need Assistance Get In / Out Bed Need Assistance Housework Need Assistance WELFORD, GUZEK ([id number]) [phone] [email or web address] Page 2 of 5 Prepare Meals Need Assistance Handle Money  Completely Able Shop for Self Completely Able Electronic Signature(s) Signed: 05/28/2023 4:21:31 PM By: Angelina Pih Entered By: Angelina Pih on 05/28/2023 05:39:21 -------------------------------------------------------------------------------- Education Screening Details Patient Name: Date of Service: [name] Poot EL J. 05/28/2023 8:15 A M Medical Record Number: [id number] Patient Account Number: [id number] Date of Birth/Sex: Treating RN: May 07, 1951 (72 y.o. [name] Primary Care Aiyannah Fayad: Gershon Crane Other Clinician: Referring Mirtie Bastyr: Treating Lanetta Figuero/Extender: Harvel Ricks in Treatment: 0 Primary Learner Assessed: Patient Learning Preferences/Education Level/Primary Language Learning Preference: Explanation, Demonstration, Video, Communication Board, Printed Material Preferred Language: English Cognitive Barrier Language Barrier: No Translator Needed: No Memory Deficit: No Emotional Barrier: No Cultural/Religious Beliefs Affecting Medical Care: No Physical Barrier Impaired Vision: No Impaired Hearing: No Decreased Hand dexterity: No Knowledge/Comprehension Knowledge Level: High Comprehension Level: High Ability to understand written instructions: High Ability to understand verbal instructions: High Motivation Anxiety Level: Calm Cooperation: Cooperative Education Importance: Acknowledges Need Interest in Health Problems: Asks Questions Perception: Coherent Willingness to Engage in Self-Management High Activities: Readiness to Engage in Self-Management High Activities: Electronic Signature(s) Signed: 05/28/2023 8:55:34 AM By: Angelina Pih Entered By: Angelina Pih on 05/28/2023 05:55:34 Buckle, [name] ([id number]) [id number] Page 3 of 5 -------------------------------------------------------------------------------- Fall Risk Assessment Details Patient Name: Date of  Service: ABDULSAMAD, [name] 05/28/2023 8:15 A M Medical Record Number: [id number] Patient Account Number: [id number] Date of Birth/Sex: Treating RN: Dec 12, 1950 (72 y.o. [name] Primary Care Shamyia Grandpre: Gershon Crane Other Clinician: Referring Lash Matulich: Treating Alazia Crocket/Extender: Harvel Ricks in Treatment: 0 Fall Risk Assessment Items Have you had 2 or more falls in the last 12 monthso 0 No Have you had any fall that resulted in injury in the last 12 monthso 0 No FALLS RISK SCREEN History of falling - immediate or within 3 months 0 No Secondary diagnosis (Do you have 2 or more medical diagnoseso) 0 No Ambulatory aid None/bed rest/wheelchair/nurse 0 No Crutches/cane/walker  15 Yes Furniture 0 No Intravenous therapy Access/Saline/Heparin Lock 0 No Gait/Transferring Normal/ bed rest/ wheelchair 0 Yes Weak (short steps with or without shuffle, stooped but able to lift head while walking, may seek 0 No support from furniture) Impaired (short steps with shuffle, may have difficulty arising from chair, head down, impaired 0 No balance) Mental Status Oriented to own ability 0 Yes Electronic Signature(s) Signed: 05/28/2023 4:21:31 PM By: Angelina Pih Entered By: Angelina Pih on 05/28/2023 05:39:40 -------------------------------------------------------------------------------- Foot Assessment Details Patient Name: Date of Service: [name] Poot EL J. 05/28/2023 8:15 A M Medical Record Number: [id number] Patient Account Number: [id number] Date of Birth/Sex: Treating RN: January 19, 1951 (72 y.o. [name] Primary Care Chestina Komatsu: Gershon Crane Other Clinician: Referring Avion Kutzer: Treating Arianny Pun/Extender: Harvel Ricks in Treatment: 0 Foot Assessment Items Site Locations [name], [name] ([id number]) [id number] Page 4 of 5 + = Sensation present, - = Sensation absent, C = Callus, U =  Ulcer R = Redness, W = Warmth, M = Maceration, PU = Pre-ulcerative lesion F = Fissure, S = Swelling, D = Dryness Assessment Right: Left: Other Deformity: No No Prior Foot Ulcer: No No Prior Amputation: No No Charcot Joint: No No Ambulatory Status: Ambulatory With Help Assistance Device: Cane Gait: Steady Electronic Signature(s) Signed: 05/28/2023 4:21:31 PM By: Angelina Pih Entered By: Angelina Pih on 05/28/2023 05:26:13 -------------------------------------------------------------------------------- Nutrition Risk Screening Details Patient Name: Date of Service: KEYSHON, FONGER 05/28/2023 8:15 A M Medical Record Number: [id number] Patient Account Number: [id number] Date of Birth/Sex: Treating RN: May 29, 1951 (71 y.o. [name] Primary Care Elda Dunkerson: Gershon Crane Other Clinician: Referring Clide Remmers: Treating Izea Livolsi/Extender: Harvel Ricks in Treatment: 0 Height (in): Weight (lbs): Body Mass Index (BMI): Nutrition Risk Screening Items Score Screening NUTRITION RISK SCREEN: I have an illness or condition that made me change the kind and/or amount of food I eat 0 No I eat fewer than two meals per day 0 No I eat few fruits and vegetables, or milk products 0 No I have three or more drinks of beer, liquor or wine almost every day 0 No I have tooth or mouth problems that make it hard for me to eat 0 No KEISHON, SCHIFFNER ([id number]) [phone] [id number] Page 5 of 5 I don't always have enough money to buy the food I need 0 No I eat alone most of the time 0 No I take three or more different prescribed or over-the-counter drugs a day 0 No Without wanting to, I have lost or gained 10 pounds in the last six months 0 No I am not always physically able to shop, cook and/or feed myself 0 No Nutrition Protocols Good Risk Protocol 0 No interventions needed Moderate Risk Protocol High Risk Proctocol Risk Level: Good  Risk Score: 0 Electronic Signature(s) Signed: 05/28/2023 4:21:31 PM By: Angelina Pih Entered By: Angelina Pih on 05/28/2023 05:40:01

## 2023-05-28 NOTE — Progress Notes (Signed)
HAORAN, STARWALT ([id number]) [id number] Page 1 of 11 Visit Report for 05/28/2023 Allergy List Details Patient Name: Date of Service: [name] [name], [name] [name] 05/28/2023 8:15 A M Medical Record Number: [id number] Patient Account Number: [id number] Date of Birth/Sex: Treating RN: 01/05/1951 (72 y.o. [name] [name] Primary Care [name] [name]: Gershon Crane Other Clinician: Referring [name]: Treating [name]/Extender: Harvel Ricks in Treatment: 0 Allergies Active Allergies penicillin Allergy Notes Electronic Signature(s) Signed: 05/28/2023 4:21:31 PM By: Angelina Pih Entered By: Angelina Pih on 05/28/2023 05:35:46 -------------------------------------------------------------------------------- Arrival Information Details Patient Name: Date of Service: [name] Poot EL J. 05/28/2023 8:15 A M Medical Record Number: [id number] Patient Account Number: [id number] Date of Birth/Sex: Treating RN: 18-Oct-1950 (72 y.o. [name] [name] Primary Care [name] [name]: Gershon Crane Other Clinician: Referring [name]: Treating [name]/Extender: Harvel Ricks in Treatment: 0 Visit Information Patient Arrived: [name] [name] Time: 08:08 Accompanied By: spouse Transfer Assistance: None Electronic Signature(s) Signed: 05/28/2023 8:50:25 AM By: Angelina Pih Entered By: Angelina Pih on 05/28/2023 05:50:25 Escutia, Nolon [name] ([id number]) [id number] Page 2 of 11 -------------------------------------------------------------------------------- Clinic Level of Care Assessment Details Patient Name: Date of Service: [name] [name], [name] [name] 05/28/2023 8:15 A M Medical Record Number: [id number] Patient Account Number: [id number] Date of Birth/Sex: Treating RN: 01/02/1951 (72 y.o. [name] [name] Primary Care [name] [name]: Gershon Crane Other Clinician: Referring Karizma Cheek: Treating Gleen Ripberger/Extender: Harvel Ricks in Treatment: 0 Clinic Level of Care Assessment Items TOOL 1 Quantity Score []  - 0 Use when EandM and Procedure is performed on INITIAL visit ASSESSMENTS - Nursing Assessment / Reassessment X- 1 20 General Physical Exam (combine w/ comprehensive assessment (listed just below) when performed on new pt. evals) X- 1 25 Comprehensive Assessment (HX, ROS, Risk Assessments, Wounds Hx, etc.) ASSESSMENTS - Wound and Skin Assessment / Reassessment []  - 0 Dermatologic / Skin Assessment (not related to wound area) ASSESSMENTS - Ostomy and/or Continence Assessment and Care []  - 0 Incontinence Assessment and Management []  - 0 Ostomy Care Assessment and Management (repouching, etc.) PROCESS - Coordination of Care X - Simple Patient / Family Education for ongoing care 1 15 []  - 0 Complex (extensive) Patient / Family Education for ongoing care X- 1 10 Staff obtains Chiropractor, Records, T Results / Process Orders est []  - 0 Staff telephones HHA, Nursing Homes / Clarify orders / etc []  - 0 Routine Transfer to another Facility (non-emergent condition) []  - 0 Routine Hospital Admission (non-emergent condition) X- 1 15 New Admissions / Manufacturing engineer / Ordering NPWT Apligraf, etc. , []  - 0 Emergency Hospital Admission (emergent condition) PROCESS - Special Needs []  - 0 Pediatric / Minor Patient Management []  - 0 Isolation Patient Management []  - 0 Hearing / Language / Visual special needs []  - 0 Assessment of Community assistance (transportation, D/C planning, etc.) []  - 0 Additional assistance / Altered mentation []  - 0 Support Surface(s) Assessment (bed, cushion, seat, etc.) INTERVENTIONS - Miscellaneous []  - 0 External ear exam []  - 0 Patient Transfer (multiple staff / Nurse, adult / Similar devices) []  - 0 Simple Staple / Suture removal (25 or less) []  - 0 Complex Staple / Suture removal (26 or more) []  - 0 Hypo/Hyperglycemic Management (do  not check if billed separately) X- 1 15 Ankle / Brachial Index (ABI) - do not check if billed separately [name] [name] ([id number]) [phone].pdf Page 3 of 11 Has the patient been seen at the hospital within the last three years: Yes Total Score: 100 Level Of Care: New/Established -  Level 3 Electronic Signature(s) Signed: 05/28/2023 4:21:31 PM By: Angelina Pih Entered By: Angelina Pih on 05/28/2023 12:01:08 -------------------------------------------------------------------------------- Compression Therapy Details Patient Name: Date of Service: [name] [name], [name] [name] 05/28/2023 8:15 A M Medical Record Number: [id number] Patient Account Number: [id number] Date of Birth/Sex: Treating RN: Dec 13, 1950 (72 y.o. [name] [name] Primary Care [name] [name]: Gershon Crane Other Clinician: Referring [name] [name]: Treating Anaelle Dunton/Extender: Harvel Ricks in Treatment: 0 Compression Therapy Performed for Wound Assessment: Wound #1 Right Lower Leg Performed By: Clinician Angelina Pih, RN Compression Type: Double Layer Post Procedure Diagnosis Same as Pre-procedure Electronic Signature(s) Signed: 05/28/2023 3:00:17 PM By: Angelina Pih Entered By: Angelina Pih on 05/28/2023 12:00:17 -------------------------------------------------------------------------------- Compression Therapy Details Patient Name: Date of Service: [name] [name], [name] [name] 05/28/2023 8:15 A M Medical Record Number: [id number] Patient Account Number: [id number] Date of Birth/Sex: Treating RN: 28-Jul-1950 (72 y.o. [name] [name] Primary Care [name] [name]: Gershon Crane Other Clinician: Referring Daylen Lipsky: Treating Don Tiu/Extender: Harvel Ricks in Treatment: 0 Compression Therapy Performed for Wound Assessment: NonWound Condition Lymphedema - Left Leg Performed By: Clinician Angelina Pih, RN Compression Type: Double Layer Post Procedure  Diagnosis Same as Pre-procedure Electronic Signature(s) Signed: 05/28/2023 3:00:28 PM By: Angelina Pih Entered By: Angelina Pih on 05/28/2023 12:00:27 [name] [name] ([id number]) [id number] Page 4 of 11 -------------------------------------------------------------------------------- Encounter Discharge Information Details Patient Name: Date of Service: [name] [name], [name] [name] 05/28/2023 8:15 A M Medical Record Number: [id number] Patient Account Number: [id number] Date of Birth/Sex: Treating RN: [birth year] (72 y.o. [name] [name] Primary Care Alia Parsley: Gershon Crane Other Clinician: Referring Robby Pirani: Treating Renea Schoonmaker/Extender: Harvel Ricks in Treatment: 0 Encounter Discharge Information Items Discharge Condition: Stable Ambulatory Status: Cane Discharge Destination: Home Transportation: Private Auto Accompanied By: wife Schedule Follow-up Appointment: Yes Clinical Summary of Care: Electronic Signature(s) Signed: 05/28/2023 3:02:20 PM By: Angelina Pih Entered By: Angelina Pih on 05/28/2023 12:02:19 -------------------------------------------------------------------------------- Lower Extremity Assessment Details Patient Name: Date of Service: [name] [name], [name] [name] 05/28/2023 8:15 A M Medical Record Number: [id number] Patient Account Number: [id number] Date of Birth/Sex: Treating RN: [birth year] (72 y.o. [name] [name] Primary Care Raziah Funnell: Gershon Crane Other Clinician: Referring Ludmilla Mcgillis: Treating Aluel Schwarz/Extender: Harvel Ricks in Treatment: 0 Edema Assessment Assessed: [Left: No] [Right: No] Edema: [Left: Yes] [Right: Yes] Calf Left: Right: Point of Measurement: 36 cm From Medial Instep 50 cm 48 cm Ankle Left: Right: Point of Measurement: 12 cm From Medial Instep 28 cm 28.5 cm Vascular Assessment Left: [[id number].pdf Page 5 of 11Right:] Pulses: Dorsalis  Pedis Palpable: [[id number].pdf Page 5 of 11Yes Yes] Posterior Tibial Palpable: [[id number].pdf Page 5 of 11Yes Yes] Extremity colors, hair growth, and conditions: Extremity Color: [[id number].pdf Page 5 of 11Red Red] Hair Growth on Extremity: [[id number].pdf Page 5 of 11No No] Temperature of Extremity: [[id number].pdf Page 5 of 11Warm Warm] Capillary Refill: [phone].pdf Page 5 of 11< 3 seconds < 3 seconds] Blood Pressure: Brachial: [[id number].pdf Page 5 of 70623 123] Ankle: [phone].pdf Page 5 of 11Dorsalis Pedis: 110 Dorsalis Pedis: 110 0.89 0.89] Toe Nail Assessment Left: Right: Thick: No No Discolored: No No Deformed: No No Improper Length and Hygiene: No No Electronic Signature(s) Signed: 05/28/2023 4:21:31 PM By: Angelina Pih Entered By: Angelina Pih on 05/28/2023 05:35:36 -------------------------------------------------------------------------------- Multi Wound Chart Details Patient Name: Date of Service: [name] Poot EL J. 05/28/2023 8:15 A M Medical Record Number: [id number] Patient Account Number: [id number] Date of Birth/Sex: Treating RN: 06-08-1951 (72 y.o. [name] [name] Primary Care Malon Siddall: Gershon Crane Other Clinician: Referring Dilara Navarrete: Treating  Aneeka Bowden/Extender: Harvel Ricks in Treatment: 0 Vital Signs Height(in): 71 Pulse(bpm): 105 Weight(lbs): 240 Blood Pressure(mmHg): 123/82 Body Mass Index(BMI): 33.5 Temperature(F): 97.8 Respiratory Rate(breaths/min): 18 [1:Photos:] [N/A:N/A] Right Lower Leg N/A N/A Wound Location: Gradually Appeared N/A N/A Wounding Event: Lymphedema N/A N/A Primary Etiology: Asthma, Osteoarthritis N/A N/A Comorbid History: 03/24/2023 N/A N/A Date Acquired: 0 N/A N/A Weeks of Treatment: Open N/A  N/A Wound StatusKASH, MCLINN ([id number]) [id number].pdf Page 6 of 11 No N/A N/A Wound Recurrence: Yes N/A N/A Clustered Wound: 3 N/A N/A Clustered Quantity: 9.9x6x0.1 N/A N/A Measurements L x W x D (cm) 46.653 N/A N/A A (cm) : rea 4.665 N/A N/A Volume (cm) : Partial Thickness N/A N/A Classification: Large N/A N/A Exudate A mount: Serosanguineous N/A N/A Exudate Type: red, brown N/A N/A Exudate Color: Small (1-33%) N/A N/A Granulation A mount: Pink, Pale N/A N/A Granulation Quality: Large (67-100%) N/A N/A Necrotic A mount: None N/A N/A Epithelialization: Treatment Notes Electronic Signature(s) Signed: 05/28/2023 4:21:31 PM By: Angelina Pih Entered By: Angelina Pih on 05/28/2023 06:17:49 -------------------------------------------------------------------------------- Multi-Disciplinary Care Plan Details Patient Name: Date of Service: [name] Poot EL J. 05/28/2023 8:15 A M Medical Record Number: [id number] Patient Account Number: [id number] Date of Birth/Sex: Treating RN: 08/09/1950 (72 y.o. [name] [name] Primary Care Trachelle Low: Gershon Crane Other Clinician: Referring Novah Nessel: Treating Kitti Mcclish/Extender: Harvel Ricks in Treatment: 0 Active Inactive Venous Leg Ulcer Nursing Diagnoses: Knowledge deficit related to disease process and management Potential for venous Insuffiency (use before diagnosis confirmed) Goals: Patient will maintain optimal edema control Date Initiated: 05/28/2023 Target Resolution Date: 07/23/2023 Goal Status: Active Verify adequate tissue perfusion prior to therapeutic compression application Date Initiated: 05/28/2023 Date Inactivated: 05/28/2023 Target Resolution Date: 05/28/2023 Goal Status: Met Interventions: Assess peripheral edema status every visit. Compression as ordered Treatment Activities: Non-invasive vascular studies : 05/28/2023 Therapeutic compression  applied : 05/28/2023 Notes: Wound/Skin Impairment Nursing Diagnoses: Impaired tissue integrity LUIZ, ANGELOS ([id number]) [phone].pdf Page 7 of 11 Knowledge deficit related to ulceration/compromised skin integrity Goals: Ulcer/skin breakdown will have a volume reduction of 30% by week 4 Date Initiated: 05/28/2023 Target Resolution Date: 06/25/2023 Goal Status: Active Ulcer/skin breakdown will have a volume reduction of 50% by week 8 Date Initiated: 05/28/2023 Target Resolution Date: 07/23/2023 Goal Status: Active Ulcer/skin breakdown will have a volume reduction of 80% by week 12 Date Initiated: 05/28/2023 Target Resolution Date: 08/20/2023 Goal Status: Active Ulcer/skin breakdown will heal within 14 weeks Date Initiated: 05/28/2023 Target Resolution Date: 09/03/2023 Goal Status: Active Interventions: Assess patient/caregiver ability to obtain necessary supplies Assess patient/caregiver ability to perform ulcer/skin care regimen upon admission and as needed Assess ulceration(s) every visit Provide education on ulcer and skin care Notes: Electronic Signature(s) Signed: 05/28/2023 3:01:33 PM By: Angelina Pih Entered By: Angelina Pih on 05/28/2023 12:01:32 -------------------------------------------------------------------------------- Non-Wound Condition Assessment Details Patient Name: Date of Service: [name] [name], [name] EL J. 05/28/2023 8:15 A M Medical Record Number: [id number] Patient Account Number: [id number] Date of Birth/Sex: Treating RN: 1951/04/12 (72 y.o. [name] [name] Primary Care Talitha Dicarlo: Gershon Crane Other Clinician: Referring Keanu Frickey: Treating Ayvah Caroll/Extender: Harvel Ricks in Treatment: 0 Non-Wound Condition: Condition: Lymphedema Location: Leg Side: Left Photos Lymphedema Assessment Symptoms: (check all that apply) Edema - Unable to Control, Pain Notes EYOB, WORM ([id number])  [phone].pdf Page 8 of 11 one possible open area noted, on left Electronic Signature(s) Signed: 05/28/2023 4:21:31 PM By: Angelina Pih Entered By: Angelina Pih on 05/28/2023 05:47:41 -------------------------------------------------------------------------------- Pain Assessment Details Patient Name: Date  of Service: [name] [name], [name] [name] 05/28/2023 8:15 A M Medical Record Number: [id number] Patient Account Number: [id number] Date of Birth/Sex: Treating RN: [birth year] (72 y.o. [name] [name] Primary Care Carvel Huskins: Gershon Crane Other Clinician: Referring Armando Bukhari: Treating Shylah Dossantos/Extender: Harvel Ricks in Treatment: 0 Active Problems Location of Pain Severity and Description of Pain Patient Has Paino Yes Site Locations Rate the pain. Current Pain Level: 4 Pain Management and Medication Current Pain Management: Electronic Signature(s) Signed: 05/28/2023 8:50:35 AM By: Angelina Pih Entered By: Angelina Pih on 05/28/2023 05:50:35 Patient/Caregiver Education Details -------------------------------------------------------------------------------- [name] ([id number]) [id number].pdf Page 9 of 11 Patient Name: Date of Service: [name] [name], [name] [name] 11/12/2024andnbsp8:15 A M Medical Record Number: [id number] Patient Account Number: [id number] Date of Birth/Gender: Treating RN: 05/09/1951 (72 y.o. [name] [name] Primary Care Physician: Gershon Crane Other Clinician: Referring Physician: Treating Physician/Extender: Harvel Ricks in Treatment: 0 Education Assessment Education Provided To: Patient spouse Education Topics Provided Venous: Handouts: Controlling Swelling with Compression Stockings Methods: Explain/Verbal Responses: State content correctly Welcome T [facility]-New Patient Packet: o Handouts: [facility] P[facility] form, Welcome T [facility] o Methods: Explain/Verbal Responses: State content correctly Wound/Skin Impairment: Handouts: Caring for Your Ulcer Methods: Explain/Verbal Responses: State content correctly Electronic Signature(s) Signed: 05/28/2023 4:21:31 PM By: Angelina Pih Entered By: Angelina Pih on 05/28/2023 06:17:33 -------------------------------------------------------------------------------- Wound Assessment Details Patient Name: Date of Service: [name] [name], [name] EL J. 05/28/2023 8:15 A M Medical Record Number: [id number] Patient Account Number: [id number] Date of Birth/Sex: Treating RN: 11-16-1950 (72 y.o. [name] [name] Primary Care Rickey Sadowski: Gershon Crane Other Clinician: Referring Gudrun Axe: Treating Padraig Nhan/Extender: Harvel Ricks in Treatment: 0 Wound Status Wound Number: 1 Primary Etiology: Lymphedema Wound Location: Right Lower Leg Wound Status: Open Wounding Event: Gradually Appeared Comorbid History: Asthma, Osteoarthritis Date Acquired: 03/24/2023 Weeks Of Treatment: 0 Clustered Wound: Yes Photos [name] [name], CLOWSER ([id number]) [phone].pdf Page 10 of 11 Wound Measurements Length: (cm) Width: (cm) Depth: (cm) Clustered Quantity: Area: (cm) Volume: (cm) 9.9 % Reduction in Area: 6 % Reduction in Volume: 0.1 Epithelialization: None 3 Tunneling: No 46.653 Undermining: No 4.665 Wound Description Classification: Partial Thickness Exudate Amount: Large Exudate Type: Serosanguineous Exudate Color: red, brown Foul Odor After Cleansing: No Slough/Fibrino Yes Wound Bed Granulation Amount: Small (1-33%) Granulation Quality: Pink, Pale Necrotic Amount: Large (67-100%) Necrotic Quality: Adherent Slough Treatment Notes Wound #1 (Lower Leg) Wound Laterality: Right Cleanser Soap and Water Discharge Instruction: Gently cleanse wound with antibacterial soap, rinse and pat dry prior to dressing wounds Peri-Wound  Care Triamcinolone Acetonide Cream, 0.1%, 15 (g) tube Discharge Instruction: to leg for itching Apply as directed. Topical Primary Dressing Aquacel Extra Hydrofiber Dressing, 4x5 (in/in) Secondary Dressing Zetuvit Plus 4x8 (in/in) Secured With Compression Wrap Urgo K2, two layer compression system, large Compression Stockings Add-Ons Electronic Signature(s) Signed: 05/28/2023 4:21:31 PM By: Angelina Pih Entered By: Angelina Pih on 05/28/2023 05:46:39 Kuechle, Nolon [name] ([id number]) [id number].pdf Page 11 of 11 -------------------------------------------------------------------------------- Vitals Details Patient Name: Date of Service: TAVEON, STEINWAND 05/28/2023 8:15 A M Medical Record Number: [id number] Patient Account Number: [id number] Date of Birth/Sex: Treating RN: June 26, 1951 (72 y.o. [name] [name] Primary Care Windle Huebert: Gershon Crane Other Clinician: Referring Jammi Morrissette: Treating Celester Lech/Extender: Harvel Ricks in Treatment: 0 Vital Signs Time Taken: 08:10 Temperature (F): 97.8 Height (in): 71 Pulse (bpm): 105 Source: Stated Respiratory Rate (breaths/min): 18 Weight (lbs): 240 Blood Pressure (mmHg): 123/82 Source: Stated Reference Range: 80 -  120 mg / dl Body Mass Index (BMI): 33.5 Electronic Signature(s) Signed: 05/28/2023 8:51:02 AM By: Angelina Pih Entered By: Angelina Pih on 05/28/2023 05:51:01

## 2023-05-30 DIAGNOSIS — I87331 Chronic venous hypertension (idiopathic) with ulcer and inflammation of right lower extremity: Secondary | ICD-10-CM | POA: Diagnosis not present

## 2023-05-30 DIAGNOSIS — G20C Parkinsonism, unspecified: Secondary | ICD-10-CM | POA: Diagnosis not present

## 2023-05-30 DIAGNOSIS — I890 Lymphedema, not elsewhere classified: Secondary | ICD-10-CM | POA: Diagnosis not present

## 2023-05-30 DIAGNOSIS — Z96653 Presence of artificial knee joint, bilateral: Secondary | ICD-10-CM | POA: Diagnosis not present

## 2023-05-30 DIAGNOSIS — L97812 Non-pressure chronic ulcer of other part of right lower leg with fat layer exposed: Secondary | ICD-10-CM | POA: Diagnosis not present

## 2023-05-30 DIAGNOSIS — L03115 Cellulitis of right lower limb: Secondary | ICD-10-CM | POA: Diagnosis not present

## 2023-05-30 DIAGNOSIS — L03116 Cellulitis of left lower limb: Secondary | ICD-10-CM | POA: Diagnosis not present

## 2023-05-30 NOTE — Progress Notes (Addendum)
ALIOUNE, [name] ([id number]) [id number] Page 1 of 10 Visit Report for 05/28/2023 Chief Complaint Document Details Patient Name: Date of Service: [name] [name], [name] [name] 05/28/2023 8:15 A M Medical Record Number: [id number] Patient Account Number: [id number] Date of Birth/Sex: Treating RN: 09-14-1950 (72 y.o. [name] [name] Primary Care Provider: Gershon [name] Other Clinician: Referring Provider: Treating Provider/Extender: [name] [name] in Treatment: 0 Information Obtained from: Patient Chief Complaint Lymphedema with ulcer right LE Electronic Signature(s) Signed: 05/28/2023 9:04:07 AM By: [name] Derry PA-C Entered By: [name] [name] on 05/28/2023 09:04:07 -------------------------------------------------------------------------------- HPI Details Patient Name: Date of Service: [name] [name] [name] J. 05/28/2023 8:15 A M Medical Record Number: [id number] Patient Account Number: [id number] Date of Birth/Sex: Treating RN: 10/28/1950 (72 y.o. [name] [name] Primary Care Provider: Gershon [name] Other Clinician: Referring Provider: Treating Provider/Extender: [name] [name] in Treatment: 0 History of Present Illness Chronic/Inactive Conditions Condition 1: 05-28-2023 on evaluation today patient's ABIs on the left and right were 0.89 and he seems to have good arterial flow with good capillary refill less than 3 seconds and his pulses are palpable and 2+. HPI Description: 05-28-2023 upon evaluation patient presents today for initial inspection here in our clinic concerning issues he has been having with his bilateral lower extremities. He had his right knee replaced about 4 years ago and his left knee replaced just a couple of months back. With that being said since that time he has been having issues with leg swelling he states this happened 4 years ago and the other knee was replaced on the right as well. With that  being said the knees are doing great. He has been on antibiotics most recently he was placed on doxycycline which he is still taking though to be honest I do not see any signs of him actually having an active infection at this point which is great news. He is here with his wife today and seems to be very interested in what he can do to improve his chances of getting this to heal and stay healed. He does have a longstanding history of lower extremity swelling worse at times as noted above. Patient does have a history of stage III lymphedema, he has had cellulitis of this seems to be resolved at this point, chronic venous hypertension, bilateral total knee joint replacements, and Parkinson's disease. Electronic Signature(s) ESTON, HERRINGTON ([id number]) [id number] Page 2 of 10 Signed: 05/28/2023 10:11:13 AM By: [name] Derry PA-C Entered By: [name] [name] on 05/28/2023 10:11:13 -------------------------------------------------------------------------------- Physical Exam Details Patient Name: Date of Service: [name] [name], [name] [name] 05/28/2023 8:15 A M Medical Record Number: [id number] Patient Account Number: [id number] Date of Birth/Sex: Treating RN: 21-Feb-1951 (72 y.o. [name] [name] Primary Care Provider: Gershon [name] Other Clinician: Referring Provider: Treating Provider/Extender: [name] [name] in Treatment: 0 Constitutional sitting or standing blood pressure is within target range for patient.. pulse regular and within target range for patient.Marland Kitchen respirations regular, non-labored and within target range for patient.Marland Kitchen temperature within target range for patient.. Obese and well-hydrated in no acute distress. Eyes conjunctiva clear no eyelid edema noted. pupils equal round and reactive to light and accommodation. Ears, Nose, Mouth, and Throat no gross abnormality of ear auricles or external auditory canals. normal hearing noted during  conversation. mucus membranes moist. Respiratory normal breathing without difficulty. Cardiovascular 2+ dorsalis pedis/posterior tibialis pulses. 1+ pitting edema of the bilateral lower extremities. Musculoskeletal normal gait and posture. no significant deformity or arthritic changes, no loss or  range of motion, no clubbing. Psychiatric this patient is able to make decisions and demonstrates good insight into disease process. Alert and Oriented x 3. pleasant and cooperative. Notes Upon inspection patient's wound bed actually showed signs of being more areas of weeping and drainage more than anything else he seems to in general be doing quite well and I am extremely pleased with where he stands today. I do not see any signs of active infection at this time and in general I think that the patient is really doing quite well with regard to his wounds the 1 caveat is that he does have edema a lot of it is more stage III nonpitting lymphedema although there is a little bit of pitting noted here in the area is not late stage III. Electronic Signature(s) Signed: 05/28/2023 10:11:50 AM By: [name] Derry PA-C Entered By: [name] [name] on 05/28/2023 10:11:50 -------------------------------------------------------------------------------- Physician Orders Details Patient Name: Date of Service: [name] [name] [name] J. 05/28/2023 8:15 A M Medical Record Number: [id number] Patient Account Number: [id number] [name] [name], [name] [name] ([id number]) [id number] Page 3 of 10 Date of Birth/Sex: Treating RN: 1950-09-09 (72 y.o. [name] [name] Primary Care Provider: Gershon [name] Other Clinician: Referring Provider: Treating Provider/Extender: [name] [name] in Treatment: 0 The following information was scribed by: [name] [name] The information was scribed for: [name] [name] Verbal / Phone Orders: No Diagnosis Coding ICD-10 Coding Code Description I89.0 Lymphedema, not  elsewhere classified L97.812 Non-pressure chronic ulcer of other part of right lower leg with fat layer exposed L03.115 Cellulitis of right lower limb I87.331 Chronic venous hypertension (idiopathic) with ulcer and inflammation of right lower extremity Z96.651 Presence of right artificial knee joint Z96.652 Presence of left artificial knee joint G20.C Parkinsonism, unspecified Follow-up Appointments Return Appointment in 1 week. Home Health ADMIT to Home Health for wound care. May utilize formulary equivalent dressing for wound treatment orders unless otherwise specified. Home Health Nurse may visit PRN to address patients wound care needs. - HH needed for compression wraps. [facility] appointment needed for this Friday 05/31/23 to re-wrap. Patient scheduled to have wraps changed once a week in wound clinic and 2x a week by [facility]. Bathing/ Shower/ Hygiene May shower with wound dressing protected with water repellent cover or cast protector. No tub bath. Edema Control - Orders / Instructions Elevate, Exercise Daily and A void Standing for Long Periods of Time. Elevate legs to the level of the heart and pump ankles as often as possible Elevate leg(s) parallel to the floor when sitting. DO YOUR BEST to sleep in the bed at night. DO NOT sleep in your recliner. Long hours of sitting in a recliner leads to swelling of the legs and/or potential wounds on your backside. Non-Wound Condition Left Lower Extremity Cleanse affected area with antibacterial soap and water, pply appropriate compression. - Aquacel Extra to weeping areas, zetuvit and URGO K2 wrap size large, if do not have this brand of wrap please A place a 3 layer compression wrap. dditional non-wound orders/instructions: - TCA to left leg for itching A Medications-Please add to medication list. ntibiotics - continue previously prescribed antibiotic and take as prescribed P.O. A Wound Treatment Wound #1 - Lower Leg Wound Laterality:  Right Cleanser: Soap and Water 3 x Per Week/15 Days Discharge Instructions: Gently cleanse wound with antibacterial soap, rinse and pat dry prior to dressing wounds Peri-Wound Care: Triamcinolone Acetonide Cream, 0.1%, 15 (g) tube 3 x Per Week/15 Days Discharge Instructions: to leg for itching Apply as directed. Prim  Dressing: Aquacel Extra Hydrofiber Dressing, 4x5 (in/in) ary 3 x Per Week/15 Days Secondary Dressing: Zetuvit Plus 4x8 (in/in) 3 x Per Week/15 Days Compression Wrap: Urgo K2, two layer compression system, large 3 x Per Week/15 Days Patient Medications llergies: penicillin A Notifications Medication Indication Start End 05/30/2023 triamcinolone acetonide DOSE topical 0.1 % ointment - ointment topical applied 3 times per week with each dressing change. Electronic Signature(s) Signed: 05/30/2023 5:26:23 PM By: Lanna Poche, Puxico J ([id number]) By: Dionne Ano [phone].pdf Page 4 of 10 Signed: 05/30/2023 5:26:23 PM Previous Signature: 05/30/2023 4:29:52 PM Version By: [name] [name] Previous Signature: 05/28/2023 4:21:31 PM Version By: [name] [name] Previous Signature: 05/30/2023 7:57:50 AM Version By: [name] Derry PA-C Entered By: [name] [name] on 05/30/2023 17:26:23 -------------------------------------------------------------------------------- Problem List Details Patient Name: Date of Service: [name] [name] [name] J. 05/28/2023 8:15 A M Medical Record Number: [id number] Patient Account Number: [id number] Date of Birth/Sex: Treating RN: [birth year] (72 y.o. [name] [name] Primary Care Provider: Gershon [name] Other Clinician: Referring Provider: Treating Provider/Extender: [name] [name] in Treatment: 0 Active Problems ICD-10 Encounter Code Description Active Date MDM Diagnosis I89.0 Lymphedema, not elsewhere classified 05/28/2023 No Yes L97.812 Non-pressure chronic ulcer of other part of right lower  leg with fat layer 05/28/2023 No Yes exposed L03.115 Cellulitis of right lower limb 05/28/2023 No Yes I87.331 Chronic venous hypertension (idiopathic) with ulcer and inflammation of right 05/28/2023 No Yes lower extremity Z96.651 Presence of right artificial knee joint 05/28/2023 No Yes Z96.652 Presence of left artificial knee joint 05/28/2023 No Yes G20.C Parkinsonism, unspecified 05/28/2023 No Yes Inactive Problems Resolved Problems Electronic Signature(s) Signed: 05/28/2023 9:03:18 AM By: [name] Derry PA-C Entered By: [name] [name] on 05/28/2023 09:03:18 Lyday, [name] ([id number]) [id number] Page 5 of 10 -------------------------------------------------------------------------------- Progress Note Details Patient Name: Date of Service: [name] [name], [name] [name] 05/28/2023 8:15 A M Medical Record Number: [id number] Patient Account Number: [id number] Date of Birth/Sex: Treating RN: [birth year] (71 y.o. [name] [name] Primary Care Provider: Gershon [name] Other Clinician: Referring Provider: Treating Provider/Extender: [name] [name] in Treatment: 0 Subjective Chief Complaint Information obtained from Patient Lymphedema with ulcer right LE History of Present Illness (HPI) Chronic/Inactive Condition: 05-28-2023 on evaluation today patient's ABIs on the left and right were 0.89 and he seems to have good arterial flow with good capillary refill less than 3 seconds and his pulses are palpable and 2+. 05-28-2023 upon evaluation patient presents today for initial inspection here in our clinic concerning issues he has been having with his bilateral lower extremities. He had his right knee replaced about 4 years ago and his left knee replaced just a couple of months back. With that being said since that time he has been having issues with leg swelling he states this happened 4 years ago and the other knee was replaced on the right as well. With  that being said the knees are doing great. He has been on antibiotics most recently he was placed on doxycycline which he is still taking though to be honest I do not see any signs of him actually having an active infection at this point which is great news. He is here with his wife today and seems to be very interested in what he can do to improve his chances of getting this to heal and stay healed. He does have a longstanding history of lower extremity swelling worse at times as noted above. Patient does have a history of stage III lymphedema, he has had  cellulitis of this seems to be resolved at this point, chronic venous hypertension, bilateral total knee joint replacements, and Parkinson's disease. Patient History Information obtained from Patient, Chart, Other: spouse. Allergies penicillin Social History Never smoker, Alcohol Use - Never, Drug Use - No History, Caffeine Use - Daily - soda. Medical History Hematologic/Lymphatic Patient has history of Lymphedema Respiratory Patient has history of Asthma - per pt in spring Musculoskeletal Patient has history of Osteoarthritis Medical A Surgical History Notes nd Hematologic/Lymphatic ENV (elephantiasis nostras verrucosa) Review of Systems (ROS) Constitutional Symptoms (General Health) Denies complaints or symptoms of Fatigue, Fever, Chills, Marked Weight Change. Eyes Denies complaints or symptoms of Dry Eyes, Vision Changes, Glasses / Contacts. Ear/Nose/Mouth/Throat Denies complaints or symptoms of Difficult clearing ears, Sinusitis. Hematologic/Lymphatic Complains or has symptoms of Bleeding / Clotting Disorders - on Eliquis. Cardiovascular Denies complaints or symptoms of Chest pain, LE edema. Gastrointestinal Denies complaints or symptoms of Frequent diarrhea, Nausea, Vomiting. Endocrine Denies complaints or symptoms of Hepatitis, Thyroid disease, Polydypsia (Excessive Thirst). Genitourinary hx kidney  stones Immunological Denies complaints or symptoms of Hives, Itching, lyme disease, rocky mountain spotted fever Integumentary (Skin) [name] [name], [name] [name] ([id number]) [id number] Page 6 of 10 Complains or has symptoms of Wounds, Swelling. Neurologic Denies complaints or symptoms of Numbness/parasthesias, Focal/Weakness, Parkinson's disease Psychiatric Denies complaints or symptoms of Anxiety, Claustrophobia. Objective Constitutional sitting or standing blood pressure is within target range for patient.. pulse regular and within target range for patient.Marland Kitchen respirations regular, non-labored and within target range for patient.Marland Kitchen temperature within target range for patient.. Obese and well-hydrated in no acute distress. Vitals Time Taken: 8:10 AM, Height: 71 in, Source: Stated, Weight: 240 lbs, Source: Stated, BMI: 33.5, Temperature: 97.8 F, Pulse: 105 bpm, Respiratory Rate: 18 breaths/min, Blood Pressure: 123/82 mmHg. Eyes conjunctiva clear no eyelid edema noted. pupils equal round and reactive to light and accommodation. Ears, Nose, Mouth, and Throat no gross abnormality of ear auricles or external auditory canals. normal hearing noted during conversation. mucus membranes moist. Respiratory normal breathing without difficulty. Cardiovascular 2+ dorsalis pedis/posterior tibialis pulses. 1+ pitting edema of the bilateral lower extremities. Musculoskeletal normal gait and posture. no significant deformity or arthritic changes, no loss or range of motion, no clubbing. Psychiatric this patient is able to make decisions and demonstrates good insight into disease process. Alert and Oriented x 3. pleasant and cooperative. General Notes: Upon inspection patient's wound bed actually showed signs of being more areas of weeping and drainage more than anything else he seems to in general be doing quite well and I am extremely pleased with where he stands today. I do not see  any signs of active infection at this time and in general I think that the patient is really doing quite well with regard to his wounds the 1 caveat is that he does have edema a lot of it is more stage III nonpitting lymphedema although there is a little bit of pitting noted here in the area is not late stage III. Integumentary (Hair, Skin) Wound #1 status is Open. Original cause of wound was Gradually Appeared. The date acquired was: 03/24/2023. The wound is located on the Right Lower Leg. The wound measures 9.9cm length x 6cm width x 0.1cm depth; 46.653cm^2 area and 4.665cm^3 volume. There is no tunneling or undermining noted. There is a large amount of serosanguineous drainage noted. There is small (1-33%) pink, pale granulation within the wound bed. There is a large (67-100%) amount of necrotic tissue within the wound bed including Adherent Slough.  Other Condition(s) Patient presents with Lymphedema located on the Left Leg. General Notes: one possible open area noted, on left Lymphedema Assessment Symptoms: (check all that apply): Edema - Unable to Control, Pain Assessment Active Problems ICD-10 Lymphedema, not elsewhere classified Non-pressure chronic ulcer of other part of right lower leg with fat layer exposed Cellulitis of right lower limb Chronic venous hypertension (idiopathic) with ulcer and inflammation of right lower extremity Presence of right artificial knee joint Presence of left artificial knee joint Parkinsonism, unspecified Procedures Wound #1 Pre-procedure diagnosis of Wound #1 is a Lymphedema located on the Right Lower Leg . There was a Double Layer Compression Therapy Procedure by [name] Pih, RN. Post procedure Diagnosis Wound #1: Same as Pre-Procedure MONA, THOMMEN ([id number]) [id number] Page 7 of 10 There was a Double Layer Compression Therapy Procedure by [name] Pih, RN. Post procedure Diagnosis Wound #: Same as  Pre-Procedure Plan Follow-up Appointments: Return Appointment in 1 week. Home Health: ADMIT to Home Health for wound care. May utilize formulary equivalent dressing for wound treatment orders unless otherwise specified. Home Health Nurse may visit PRN to address patients wound care needs. - HH needed for compression wraps. [facility] appointment needed for this Friday 05/31/23 to re-wrap. Patient scheduled to have wraps changed once a week in wound clinic and 2x a week by [facility]. Bathing/ Shower/ Hygiene: May shower with wound dressing protected with water repellent cover or cast protector. No tub bath. Edema Control - Orders / Instructions: Elevate, Exercise Daily and Avoid Standing for Long Periods of Time. Elevate legs to the level of the heart and pump ankles as often as possible Elevate leg(s) parallel to the floor when sitting. DO YOUR BEST to sleep in the bed at night. DO NOT sleep in your recliner. Long hours of sitting in a recliner leads to swelling of the legs and/or potential wounds on your backside. Non-Wound Condition: Cleanse affected area with antibacterial soap and water, Apply appropriate compression. - Aquacel Extra to weeping areas, zetuvit and URGO K2 wrap size large, if do not have this brand of wrap please place a 3 layer compression wrap. Additional non-wound orders/instructions: - TCA to left leg for itching Medications-Please add to medication list.: P.O. Antibiotics - continue previously prescribed antibiotic and take as prescribed The following medication(s) was prescribed: triamcinolone acetonide topical 0.1 % ointment ointment topical applied 3 times per week with each dressing change. starting 05/30/2023 WOUND #1: - Lower Leg Wound Laterality: Right Cleanser: Soap and Water 3 x Per Week/15 Days Discharge Instructions: Gently cleanse wound with antibacterial soap, rinse and pat dry prior to dressing wounds Peri-Wound Care: Triamcinolone Acetonide Cream, 0.1%, 15 (g)  tube 3 x Per Week/15 Days Discharge Instructions: to leg for itching Apply as directed. Prim Dressing: Aquacel Extra Hydrofiber Dressing, 4x5 (in/in) 3 x Per Week/15 Days ary Secondary Dressing: Zetuvit Plus 4x8 (in/in) 3 x Per Week/15 Days Com pression Wrap: Urgo K2, two layer compression system, large 3 x Per Week/15 Days 1. Based on what I am seeing I do believe that the patient would benefit from initiation of therapy with compression. I think lymphedema pumps would be appropriate form but we need to see how he does with compression therapy first. I am go ahead and initiate therapy currently with Aquacel to the wounds and open draining areas followed by Zetuvit. 2. I am good recommend initiation of therapy with Urgo K2 compression wrap which is equivalent to a 4-layer compression wrap. 3. I am also can recommend that we  go ahead and have him continue to elevate his legs whenever he is sitting and watching TV or relaxing I think elevation is very also notes that he has a bed that now allows him to elevate his legs even more which is even better. He can get them up pretty high at nighttime which is great. 4. With regard to fluid pills he is currently on hydrochlorothiazide though he is going to contact his primary care provider to see if this can be up to a little bit for short amount of time. 5. With regard to lymphedema pumps he has never had these before I think they are appropriate he does have stage III lymphedema but we need to see how he does with just standard compression and wound care at this point he is also exercising and walking is much as he can. Obviously if we can get the size of his legs down and on make his walking a bit easier. We will see patient back for reevaluation in 1 week here in the clinic. If anything worsens or changes patient will contact our office for additional recommendations. Electronic Signature(s) Signed: 05/30/2023 5:26:40 PM By: [name] Derry PA-C Previous  Signature: 05/28/2023 10:13:23 AM Version By: [name] Derry PA-C Entered By: [name] [name] on 05/30/2023 17:26:40 ROS/PFSH Details -------------------------------------------------------------------------------- [name] [name] ([id number]) [id number] Page 8 of 10 Patient Name: Date of Service: [name] [name], [name] [name] 05/28/2023 8:15 A M Medical Record Number: [id number] Patient Account Number: [id number] Date of Birth/Sex: Treating RN: [birth year] (72 y.o. [name] [name] Primary Care Provider: Gershon [name] Other Clinician: Referring Provider: Treating Provider/Extender: [name] [name] in Treatment: 0 Information Obtained From Patient Chart Other: spouse Constitutional Symptoms (General Health) Complaints and Symptoms: Negative for: Fatigue; Fever; Chills; Marked Weight Change Eyes Complaints and Symptoms: Negative for: Dry Eyes; Vision Changes; Glasses / Contacts Ear/Nose/Mouth/Throat Complaints and Symptoms: Negative for: Difficult clearing ears; Sinusitis Hematologic/Lymphatic Complaints and Symptoms: Positive for: Bleeding / Clotting Disorders - on Eliquis Medical History: Positive for: Lymphedema Past Medical History Notes: ENV (elephantiasis nostras verrucosa) Cardiovascular Complaints and Symptoms: Negative for: Chest pain; LE edema Gastrointestinal Complaints and Symptoms: Negative for: Frequent diarrhea; Nausea; Vomiting Endocrine Complaints and Symptoms: Negative for: Hepatitis; Thyroid disease; Polydypsia (Excessive Thirst) Immunological Complaints and Symptoms: Negative for: Hives; Itching Review of System Notes: lyme disease, rocky mountain spotted fever Integumentary (Skin) Complaints and Symptoms: Positive for: Wounds; Swelling Neurologic Complaints and Symptoms: Negative for: Numbness/parasthesias; Focal/Weakness Review of System Notes: Parkinson's disease Psychiatric Complaints and  Symptoms: Negative for: Anxiety; Claustrophobia Respiratory Medical History: Positive for: Asthma - per pt in spring [name] [name], [name] [name] ([id number]) [phone].pdf Page 9 of 10 Genitourinary Complaints and Symptoms: Review of System Notes: hx kidney stones Musculoskeletal Medical History: Positive for: Osteoarthritis Oncologic Immunizations Pneumococcal Vaccine: Received Pneumococcal Vaccination: Yes Received Pneumococcal Vaccination On or After 60th Birthday: Yes Implantable Devices None Family and Social History Never smoker; Alcohol Use: Never; Drug Use: No History; Caffeine Use: Daily - soda Electronic Signature(s) Signed: 05/28/2023 4:21:31 PM By: [name] [name] Signed: 05/30/2023 7:57:50 AM By: [name] Derry PA-C Entered By: [name] [name] on 05/28/2023 08:55:01 -------------------------------------------------------------------------------- SuperBill Details Patient Name: Date of Service: [name] [name] [name] J. 05/28/2023 Medical Record Number: [id number] Patient Account Number: [id number] Date of Birth/Sex: Treating RN: Dec 02, 1950 (72 y.o. 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 ([id number]) [id number] Modifier: [id number] Quantity: 1 17.pdf Page 10 of 10 Physician Procedures : CPT4 Code Description Modifier [id number] [phone] - WC PHYS LEVEL 4 - NEW PT ICD-10 Diagnosis Description I89.0 Lymphedema, not elsewhere classified L97.812 Non-pressure chronic ulcer of other part of right lower leg with fat layer exposed L03.115  Cellulitis of right lower limb I87.331 Chronic venous hypertension (idiopathic) with ulcer and inflammation of right lower extremity Quantity: 1 Electronic Signature(s) Signed: 05/30/2023 5:26:49 PM By: [name] Derry PA-C Previous Signature: 05/28/2023 3:33:53 PM Version By: [name] [name] Previous Signature: 05/30/2023 7:57:50 AM Version By: [name] Derry PA-C Previous Signature: 05/28/2023 3:01:23 PM Version By: [name] [name] Previous Signature: 05/28/2023 10:13:34 AM Version By: [name] Derry PA-C Entered By: [name] [name] on 05/30/2023 17:26:49

## 2023-05-31 NOTE — Telephone Encounter (Signed)
No, I have not received any notes from them. What is ENV?

## 2023-06-03 DIAGNOSIS — L03115 Cellulitis of right lower limb: Secondary | ICD-10-CM | POA: Diagnosis not present

## 2023-06-03 DIAGNOSIS — I87331 Chronic venous hypertension (idiopathic) with ulcer and inflammation of right lower extremity: Secondary | ICD-10-CM | POA: Diagnosis not present

## 2023-06-03 DIAGNOSIS — G20C Parkinsonism, unspecified: Secondary | ICD-10-CM | POA: Diagnosis not present

## 2023-06-03 DIAGNOSIS — L97812 Non-pressure chronic ulcer of other part of right lower leg with fat layer exposed: Secondary | ICD-10-CM | POA: Diagnosis not present

## 2023-06-03 DIAGNOSIS — I890 Lymphedema, not elsewhere classified: Secondary | ICD-10-CM | POA: Diagnosis not present

## 2023-06-03 DIAGNOSIS — L03116 Cellulitis of left lower limb: Secondary | ICD-10-CM | POA: Diagnosis not present

## 2023-06-05 DIAGNOSIS — I890 Lymphedema, not elsewhere classified: Secondary | ICD-10-CM | POA: Diagnosis not present

## 2023-06-05 DIAGNOSIS — L03116 Cellulitis of left lower limb: Secondary | ICD-10-CM | POA: Diagnosis not present

## 2023-06-05 DIAGNOSIS — L03115 Cellulitis of right lower limb: Secondary | ICD-10-CM | POA: Diagnosis not present

## 2023-06-05 DIAGNOSIS — I87331 Chronic venous hypertension (idiopathic) with ulcer and inflammation of right lower extremity: Secondary | ICD-10-CM | POA: Diagnosis not present

## 2023-06-05 DIAGNOSIS — G20C Parkinsonism, unspecified: Secondary | ICD-10-CM | POA: Diagnosis not present

## 2023-06-05 DIAGNOSIS — L97812 Non-pressure chronic ulcer of other part of right lower leg with fat layer exposed: Secondary | ICD-10-CM | POA: Diagnosis not present

## 2023-06-07 ENCOUNTER — Encounter: Payer: Medicare Other | Admitting: Physician Assistant

## 2023-06-07 DIAGNOSIS — L97812 Non-pressure chronic ulcer of other part of right lower leg with fat layer exposed: Secondary | ICD-10-CM | POA: Diagnosis not present

## 2023-06-07 DIAGNOSIS — I890 Lymphedema, not elsewhere classified: Secondary | ICD-10-CM | POA: Diagnosis not present

## 2023-06-07 DIAGNOSIS — G20A1 Parkinson's disease without dyskinesia, without mention of fluctuations: Secondary | ICD-10-CM | POA: Diagnosis not present

## 2023-06-07 NOTE — Progress Notes (Signed)
[name] [name], [name] [name] ([id number]) [id number] Page 1 of 10 Visit Report for 06/07/2023 Arrival Information Details Patient Name: Date of Service: [name] [name], [name] [name] 06/07/2023 9:15 A M Medical Record Number: [id number] Patient Account Number: [id number] Date of Birth/Sex: Treating RN: [birth year] (72 y.o. [name] [name] Primary Care Anastyn Ayars: Gershon Crane Other Clinician: Referring Tallia Moehring: Treating Cloie Wooden/Extender: Harvel Ricks in Treatment: 1 Visit Information History Since Last Visit Added or deleted any medications: Yes Patient Arrived: Cane Any new allergies or adverse reactions: No Arrival Time: 09:11 Had a fall or experienced change in No Accompanied By: family activities of daily living that may affect Transfer Assistance: None risk of falls: Patient Identification Verified: Yes Hospitalized since last visit: No Secondary Verification Process Completed: Yes Has Dressing in Place as Prescribed: Yes Has Compression in Place as Prescribed: Yes Pain Present Now: No Electronic Signature(s) Signed: 06/07/2023 12:40:51 PM By: Angelina Pih Entered By: Angelina Pih on 06/07/2023 09:11:40 -------------------------------------------------------------------------------- Clinic Level of Care Assessment Details Patient Name: Date of Service: [name] [name] 06/07/2023 9:15 A M Medical Record Number: [id number] Patient Account Number: [id number] Date of Birth/Sex: Treating RN: 12/14/1950 (72 y.o. [name] [name] Primary Care Lonie Newsham: Gershon Crane Other Clinician: Referring Daleysa Kristiansen: Treating Alis Sawchuk/Extender: Harvel Ricks in Treatment: 1 Clinic Level of Care Assessment Items TOOL 1 Quantity Score []  - 0 Use when EandM and Procedure is performed on INITIAL visit ASSESSMENTS - Nursing Assessment / Reassessment []  - 0 General Physical Exam (combine w/ comprehensive assessment (listed just  below) when performed on new pt. evals) []  - 0 Comprehensive Assessment (HX, ROS, Risk Assessments, Wounds Hx, etc.) ASSESSMENTS - Wound and Skin Assessment / Reassessment []  - 0 Dermatologic / Skin Assessment (not related to wound area) [name] [name] ([id number]) [id number] Page 2 of 10 ASSESSMENTS - Ostomy and/or Continence Assessment and Care []  - 0 Incontinence Assessment and Management []  - 0 Ostomy Care Assessment and Management (repouching, etc.) PROCESS - Coordination of Care []  - 0 Simple Patient / Family Education for ongoing care []  - 0 Complex (extensive) Patient / Family Education for ongoing care []  - 0 Staff obtains Chiropractor, Records, T Results / Process Orders est []  - 0 Staff telephones HHA, Nursing Homes / Clarify orders / etc []  - 0 Routine Transfer to another Facility (non-emergent condition) []  - 0 Routine Hospital Admission (non-emergent condition) []  - 0 New Admissions / Manufacturing engineer / Ordering NPWT Apligraf, etc. , []  - 0 Emergency Hospital Admission (emergent condition) PROCESS - Special Needs []  - 0 Pediatric / Minor Patient Management []  - 0 Isolation Patient Management []  - 0 Hearing / Language / Visual special needs []  - 0 Assessment of Community assistance (transportation, D/C planning, etc.) []  - 0 Additional assistance / Altered mentation []  - 0 Support Surface(s) Assessment (bed, cushion, seat, etc.) INTERVENTIONS - Miscellaneous []  - 0 External ear exam []  - 0 Patient Transfer (multiple staff / Nurse, adult / Similar devices) []  - 0 Simple Staple / Suture removal (25 or less) []  - 0 Complex Staple / Suture removal (26 or more) []  - 0 Hypo/Hyperglycemic Management (do not check if billed separately) []  - 0 Ankle / Brachial Index (ABI) - do not check if billed separately Has the patient been seen at the hospital within the last three years: Yes Total Score: 0 Level Of Care:  ____ Electronic Signature(s) Signed: 06/07/2023 12:40:51 PM By: Angelina Pih Entered By: Angelina Pih on 06/07/2023 11:13:18 -------------------------------------------------------------------------------- Compression Therapy Details  Patient Name: Date of Service: [name] [name], [name] [name] 06/07/2023 9:15 A M Medical Record Number: [id number] Patient Account Number: [id number] Date of Birth/Sex: Treating RN: October 31, 1950 (72 y.o. [name] [name] Primary Care Berlie Hatchel: Gershon Crane Other Clinician: Referring Wofford Stratton: Treating Mancel Lardizabal/Extender: Harvel Ricks in Treatment: 1 Compression Therapy Performed for Wound Assessment: Wound #1 Right Lower Leg Performed By: Clinician Angelina Pih, RN Compression TypeBRANTLY, [name] [name] ([id number]) [id number] Page 3 of 10 Post Procedure Diagnosis Same as Pre-procedure Electronic Signature(s) Signed: 06/07/2023 12:40:51 PM By: Angelina Pih Entered By: Angelina Pih on 06/07/2023 10:09:16 -------------------------------------------------------------------------------- Compression Therapy Details Patient Name: Date of Service: [name] [name], [name] [name] 06/07/2023 9:15 A M Medical Record Number: [id number] Patient Account Number: [id number] Date of Birth/Sex: Treating RN: Jun 28, 1951 (72 y.o. [name] [name] Primary Care Annalaya Wile: Gershon Crane Other Clinician: Referring Monserrate Blaschke: Treating Michelangelo Rindfleisch/Extender: Harvel Ricks in Treatment: 1 Compression Therapy Performed for Wound Assessment: NonWound Condition Lymphedema - Left Leg Performed By: Clinician Angelina Pih, RN Compression Type: Double Layer Post Procedure Diagnosis Same as Pre-procedure Electronic Signature(s) Signed: 06/07/2023 12:40:51 PM By: Angelina Pih Entered By: Angelina Pih on 06/07/2023  10:09:25 -------------------------------------------------------------------------------- Encounter Discharge Information Details Patient Name: Date of Service: [name] Poot EL J. 06/07/2023 9:15 A M Medical Record Number: [id number] Patient Account Number: [id number] Date of Birth/Sex: Treating RN: 1950/11/26 (72 y.o. [name] [name] Primary Care Nasser Ku: Gershon Crane Other Clinician: Referring Caliyah Sieh: Treating Nadav Swindell/Extender: Harvel Ricks in Treatment: 1 Encounter Discharge Information Items Discharge Condition: Stable Ambulatory Status: Cane Discharge Destination: Home Transportation: Private Auto Accompanied By: family Schedule Follow-up Appointment: Yes Clinical Summary of Care: DERYAN, HERIN ([id number]) [id number] Page 4 of 10 Electronic Signature(s) Signed: 06/07/2023 11:14:12 AM By: Angelina Pih Entered By: Angelina Pih on 06/07/2023 11:14:11 -------------------------------------------------------------------------------- Lower Extremity Assessment Details Patient Name: Date of Service: [name] [name], [name] [name] 06/07/2023 9:15 A M Medical Record Number: [id number] Patient Account Number: [id number] Date of Birth/Sex: Treating RN: 1951-04-01 (72 y.o. [name] [name] Primary Care Mollie Rossano: Gershon Crane Other Clinician: Referring Matteo Banke: Treating Emitt Maglione/Extender: Harvel Ricks in Treatment: 1 Edema Assessment Assessed: [Left: No] [Right: No] Edema: [Left: Yes] [Right: Yes] Calf Left: Right: Point of Measurement: 36 cm From Medial Instep 44 cm 41 cm Ankle Left: Right: Point of Measurement: 12 cm From Medial Instep 27 cm 27 cm Vascular Assessment Pulses: Dorsalis Pedis Palpable: [Left:Yes] [Right:Yes] Extremity colors, hair growth, and conditions: Extremity Color: [Left:Red] [Right:Red] Hair Growth on Extremity: [Left:No] [Right:No] Temperature of Extremity: [Left:Warm < 3  seconds] [Right:Warm < 3 seconds] Toe Nail Assessment Left: Right: Thick: No No Discolored: No No Deformed: No No Improper Length and Hygiene: No No Notes toes cool Electronic Signature(s) Signed: 06/07/2023 12:40:51 PM By: Angelina Pih Entered By: Angelina Pih on 06/07/2023 09:33:14 [place], [name] ([id number]) [id number] Page 5 of 10 -------------------------------------------------------------------------------- Multi Wound Chart Details Patient Name: Date of Service: [name] [name], [name] [name] 06/07/2023 9:15 A M Medical Record Number: [id number] Patient Account Number: [id number] Date of Birth/Sex: Treating RN: 1951/03/05 (72 y.o. [name] [name] Primary Care Shirlean Berman: Gershon Crane Other Clinician: Referring Keshana Klemz: Treating Renna Kilmer/Extender: Harvel Ricks in Treatment: 1 Vital Signs Height(in): 71 Pulse(bpm): 97 Weight(lbs): 240 Blood Pressure(mmHg): 146/88 Body Mass Index(BMI): 33.5 Temperature(F): 97.5 Respiratory Rate(breaths/min): 18 [1:Photos:] [N/A:N/A] Right Lower Leg N/A N/A Wound Location: Gradually Appeared N/A N/A Wounding Event: Lymphedema N/A N/A Primary Etiology: Lymphedema, Asthma, Osteoarthritis N/A N/A Comorbid History: 03/24/2023 N/A N/A Date  Acquired: 1 N/A N/A Weeks of Treatment: Open N/A N/A Wound Status: No N/A N/A Wound Recurrence: Yes N/A N/A Clustered Wound: 3 N/A N/A Clustered Quantity: 1.5x1x0.1 N/A N/A Measurements L x W x D (cm) 1.178 N/A N/A A (cm) : rea 0.118 N/A N/A Volume (cm) : 97.50% N/A N/A % Reduction in A rea: 97.50% N/A N/A % Reduction in Volume: Partial Thickness N/A N/A Classification: Medium N/A N/A Exudate A mount: Serosanguineous N/A N/A Exudate Type: red, brown N/A N/A Exudate Color: Large (67-100%) N/A N/A Granulation A mount: Pink, Pale N/A N/A Granulation Quality: None Present (0%) N/A N/A Necrotic A mount: None N/A  N/A Epithelialization: Treatment Notes Electronic Signature(s) Signed: 06/07/2023 12:40:51 PM By: Angelina Pih Entered By: Angelina Pih on 06/07/2023 09:33:58 Kortz, [name] ([id number]) [id number] Page 6 of 10 -------------------------------------------------------------------------------- Multi-Disciplinary Care Plan Details Patient Name: Date of Service: [name] [name], [name] [name] 06/07/2023 9:15 A M Medical Record Number: [id number] Patient Account Number: [id number] Date of Birth/Sex: Treating RN: 03-08-1951 (72 y.o. [name] [name] Primary Care Demetria Iwai: Gershon Crane Other Clinician: Referring Ilea Hilton: Treating Rien Marland/Extender: Harvel Ricks in Treatment: 1 Active Inactive Venous Leg Ulcer Nursing Diagnoses: Knowledge deficit related to disease process and management Potential for venous Insuffiency (use before diagnosis confirmed) Goals: Patient will maintain optimal edema control Date Initiated: 05/28/2023 Target Resolution Date: 07/23/2023 Goal Status: Active Verify adequate tissue perfusion prior to therapeutic compression application Date Initiated: 05/28/2023 Date Inactivated: 05/28/2023 Target Resolution Date: 05/28/2023 Goal Status: Met Interventions: Assess peripheral edema status every visit. Compression as ordered Treatment Activities: Non-invasive vascular studies : 05/28/2023 Therapeutic compression applied : 05/28/2023 Notes: Wound/Skin Impairment Nursing Diagnoses: Impaired tissue integrity Knowledge deficit related to ulceration/compromised skin integrity Goals: Ulcer/skin breakdown will have a volume reduction of 30% by week 4 Date Initiated: 05/28/2023 Target Resolution Date: 06/25/2023 Goal Status: Active Ulcer/skin breakdown will have a volume reduction of 50% by week 8 Date Initiated: 05/28/2023 Target Resolution Date: 07/23/2023 Goal Status: Active Ulcer/skin breakdown will have a  volume reduction of 80% by week 12 Date Initiated: 05/28/2023 Target Resolution Date: 08/20/2023 Goal Status: Active Ulcer/skin breakdown will heal within 14 weeks Date Initiated: 05/28/2023 Target Resolution Date: 09/03/2023 Goal Status: Active Interventions: Assess patient/caregiver ability to obtain necessary supplies Assess patient/caregiver ability to perform ulcer/skin care regimen upon admission and as needed Assess ulceration(s) every visit Provide education on ulcer and skin care Notes: [name] [name], [name] [name] ([id number]) [phone] Page 7 of 10 Electronic Signature(s) Signed: 06/07/2023 11:13:34 AM By: Angelina Pih Entered By: Angelina Pih on 06/07/2023 11:13:34 -------------------------------------------------------------------------------- Non-Wound Condition Assessment Details Patient Name: Date of Service: [name] [name], [name] [name] 06/07/2023 9:15 A M Medical Record Number: [id number] Patient Account Number: [id number] Date of Birth/Sex: Treating RN: [birth year] (72 y.o. [name] [name] Primary Care Amina Menchaca: Gershon Crane Other Clinician: Referring Bellatrix Devonshire: Treating Elsbeth Yearick/Extender: Harvel Ricks in Treatment: 1 Non-Wound Condition: Condition: Lymphedema Location: Leg Side: Left Photos Notes no open areas noted Electronic Signature(s) Signed: 06/07/2023 12:40:51 PM By: Angelina Pih Entered By: Angelina Pih on 06/07/2023 09:33:39 -------------------------------------------------------------------------------- Pain Assessment Details Patient Name: Date of Service: [name] [name], [name] [name]. 06/07/2023 9:15 A M Medical Record Number: [id number] Patient Account Number: [id number] Date of Birth/Sex: Treating RN: Sep 12, 1950 (72 y.o. [name] [name] Primary Care Callen Zuba: Gershon Crane Other Clinician: Referring Rozann Holts: Treating Faryn Sieg/Extender: Curt Jews [place], [place] ([id number])  [id number] Page 8 of 10 Weeks in Treatment: 1 Active Problems Location of Pain Severity and Description of Pain Patient  Has Paino No Site Locations Rate the pain. Current Pain Level: 0 Pain Management and Medication Current Pain Management: Electronic Signature(s) Signed: 06/07/2023 12:40:51 PM By: Angelina Pih Entered By: Angelina Pih on 06/07/2023 09:14:40 -------------------------------------------------------------------------------- Patient/Caregiver Education Details Patient Name: Date of Service: Edwena Bunde 11/22/2024andnbsp9:15 A M Medical Record Number: [id number] Patient Account Number: [id number] Date of Birth/Gender: Treating RN: [birth year] (72 y.o. [name] [name] Primary Care Physician: Gershon Crane Other Clinician: Referring Physician: Treating Physician/Extender: Harvel Ricks in Treatment: 1 Education Assessment Education Provided To: Patient Education Topics Provided Wound/Skin Impairment: Handouts: Caring for Your Ulcer Methods: Explain/Verbal Responses: State content correctly Electronic Signature(s) Signed: 06/07/2023 12:40:51 PM By: Angelina Pih Entered By: Angelina Pih on 06/07/2023 11:13:43 Troiano, [name] ([id number]) [id number] Page 9 of 10 -------------------------------------------------------------------------------- Wound Assessment Details Patient Name: Date of Service: ION, WIESNER 06/07/2023 9:15 A M Medical Record Number: [id number] Patient Account Number: [id number] Date of Birth/Sex: Treating RN: 1951-02-05 (72 y.o. [name] [name] Primary Care Keoshia Steinmetz: Gershon Crane Other Clinician: Referring Zohal Reny: Treating Lashell Moffitt/Extender: Harvel Ricks in Treatment: 1 Wound Status Wound Number: 1 Primary Etiology: Lymphedema Wound Location: Right Lower Leg Wound Status: Open Wounding Event: Gradually  Appeared Comorbid History: Lymphedema, Asthma, Osteoarthritis Date Acquired: 03/24/2023 Weeks Of Treatment: 1 Clustered Wound: Yes Photos Wound Measurements Length: (cm) Width: (cm) Depth: (cm) Clustered Quantity: Area: (cm) Volume: (cm) 1.5 % Reduction in Area: 97.5% 1 % Reduction in Volume: 97.5% 0.1 Epithelialization: None 3 Tunneling: No 1.178 Undermining: No 0.118 Wound Description Classification: Partial Thickness Exudate Amount: Medium Exudate Type: Serosanguineous Exudate Color: red, brown Foul Odor After Cleansing: No Slough/Fibrino No Wound Bed Granulation Amount: Large (67-100%) Granulation Quality: Pink, Pale Necrotic Amount: None Present (0%) Treatment Notes Wound #1 (Lower Leg) Wound Laterality: Right Cleanser Soap and Water Discharge Instruction: Gently cleanse wound with antibacterial soap, rinse and pat dry prior to dressing wounds Peri-Wound Care Triamcinolone Acetonide Cream, 0.1%, 15 (g) tube JAMAI, JESPERSEN ([id number]) [id number] Page 10 of 10 Discharge Instruction: to leg for itching Apply as directed. Topical Primary Dressing Aquacel Extra Hydrofiber Dressing, 4x5 (in/in) Secondary Dressing ABD Pad 5x9 (in/in) Discharge Instruction: Cover with ABD pad Secured With Compression Wrap Urgo K2, two layer compression system, large Compression Stockings Add-Ons Electronic Signature(s) Signed: 06/07/2023 12:40:51 PM By: Angelina Pih Entered By: Angelina Pih on 06/07/2023 09:32:05 -------------------------------------------------------------------------------- Vitals Details Patient Name: Date of Service: [name] Poot EL J. 06/07/2023 9:15 A M Medical Record Number: [id number] Patient Account Number: [id number] Date of Birth/Sex: Treating RN: 11/28/1950 (72 y.o. [name] [name] Primary Care Dannel Rafter: Gershon Crane Other Clinician: Referring Sheala Dosh: Treating Naseem Adler/Extender: Harvel Ricks in Treatment: 1 Vital Signs Time Taken: 09:14 Temperature (F): 97.5 Height (in): 71 Pulse (bpm): 97 Weight (lbs): 240 Respiratory Rate (breaths/min): 18 Body Mass Index (BMI): 33.5 Blood Pressure (mmHg): 146/88 Reference Range: 80 - 120 mg / dl Electronic Signature(s) Signed: 06/07/2023 12:40:51 PM By: Angelina Pih Entered By: Angelina Pih on 06/07/2023 09:14:36

## 2023-06-07 NOTE — Progress Notes (Addendum)
KREED, POLYAK ([id number]) [id number] Page 1 of 7 Visit Report for 06/07/2023 Chief Complaint Document Details Patient Name: Date of Service: [name] [name], [name] [name] 06/07/2023 9:15 A M Medical Record Number: [id number] Patient Account Number: [id number] Date of Birth/Sex: Treating RN: 12/17/1950 (72 y.o. [name] [name] Primary Care Provider: Gershon [name] Other Clinician: Referring Provider: Treating Provider/Extender: [name] [name] in Treatment: 1 Information Obtained from: Patient Chief Complaint Lymphedema with ulcer right LE Electronic Signature(s) Signed: 06/07/2023 9:25:52 AM By: [name] Derry PA-C Entered By: [name] [name] on 06/07/2023 09:25:52 -------------------------------------------------------------------------------- HPI Details Patient Name: Date of Service: [name] Poot EL J. 06/07/2023 9:15 A M Medical Record Number: [id number] Patient Account Number: [id number] Date of Birth/Sex: Treating RN: 03-10-1951 (72 y.o. [name] [name] Primary Care Provider: Gershon [name] Other Clinician: Referring Provider: Treating Provider/Extender: [name] [name] in Treatment: 1 History of Present Illness Chronic/Inactive Conditions Condition 1: 05-28-2023 on evaluation today patient's ABIs on the left and right were 0.89 and he seems to have good arterial flow with good capillary refill less than 3 seconds and his pulses are palpable and 2+. HPI Description: 05-28-2023 upon evaluation patient presents today for initial inspection here in our clinic concerning issues he has been having with his bilateral lower extremities. He had his right knee replaced about 4 years ago and his left knee replaced just a couple of months back. With that being said since that time he has been having issues with leg swelling he states this happened 4 years ago and the other knee was replaced on the right as well. With that  being said the knees are doing great. He has been on antibiotics most recently he was placed on doxycycline which he is still taking though to be honest I do not see any signs of him actually having an active infection at this point which is great news. He is here with his wife today and seems to be very interested in what he can do to improve his chances of getting this to heal and stay healed. He does have a longstanding history of lower extremity swelling worse at times as noted above. Patient does have a history of stage III lymphedema, he has had cellulitis of this seems to be resolved at this point, chronic venous hypertension, bilateral total knee joint replacements, and Parkinson's disease. 06-07-2023 upon evaluation today patient actually appears to be making excellent progress in regard to his wounds. He has been tolerating the dressing changes actually feel like he could potentially be completely healed although this is something that we are still going to monitor for another week I am not can call it yet. I think that we should continue with the compression wraps however and as well with the medication at this point. [name] [name] ([id number]) [id number] Page 2 of 7 Electronic Signature(s) Signed: 06/07/2023 12:46:41 PM By: [name] Derry PA-C Entered By: [name] [name] on 06/07/2023 12:46:41 -------------------------------------------------------------------------------- Physical Exam Details Patient Name: Date of Service: [name] [name], [name] [name] 06/07/2023 9:15 A M Medical Record Number: [id number] Patient Account Number: [id number] Date of Birth/Sex: Treating RN: 05/08/1951 (72 y.o. [name] [name] Primary Care Provider: Gershon [name] Other Clinician: Referring Provider: Treating Provider/Extender: [name] [name] in Treatment: 1 Constitutional Well-nourished and well-hydrated in no acute distress. Respiratory normal breathing  without difficulty. Psychiatric this patient is able to make decisions and demonstrates good insight into disease process. Alert and Oriented x 3. pleasant and cooperative. Notes Upon inspection patient's  wound bed actually showed signs of good granulation and epithelization at this point. Fortunately I do not see any signs of worsening overall and I believe that the patient is making good headway here towards closure which is great news. Electronic Signature(s) Signed: 06/07/2023 12:47:05 PM By: [name] Derry PA-C Entered By: [name] [name] on 06/07/2023 12:47:04 -------------------------------------------------------------------------------- Physician Orders Details Patient Name: Date of Service: [name] Poot EL J. 06/07/2023 9:15 A M Medical Record Number: [id number] Patient Account Number: [id number] Date of Birth/Sex: Treating RN: 1950-11-05 (72 y.o. [name] [name] Primary Care Provider: Gershon [name] Other Clinician: Referring Provider: Treating Provider/Extender: [name] [name] in Treatment: 1 The following information was scribed by: Angelina Pih The information was scribed for: [name] [name] Verbal / Phone Orders: No Diagnosis Coding ICD-10 Coding [name] [name] ([id number]) [id number].pdf Page 3 of 7 Code Description I89.0 Lymphedema, not elsewhere classified L97.812 Non-pressure chronic ulcer of other part of right lower leg with fat layer exposed L03.115 Cellulitis of right lower limb I87.331 Chronic venous hypertension (idiopathic) with ulcer and inflammation of right lower extremity Z96.651 Presence of right artificial knee joint Z96.652 Presence of left artificial knee joint G20.C Parkinsonism, unspecified Follow-up Appointments Return Appointment in 2 weeks. Home Health Home Health Company: - [facility] for wound care. May utilize formulary equivalent dressing for wound treatment orders unless  otherwise specified. Home Health Nurse may visit PRN to address patients wound care needs. Scheduled days for dressing changes to be completed; exception, patient has scheduled wound care visit that day. **Please direct any NON-WOUND related issues/requests for orders to patient's Primary Care Physician. **If current dressing causes regression in wound condition, may D/C ordered dressing product/s and apply Normal Saline Moist Dressing daily until next Wound Healing Center or Other MD appointment. **Notify Wound Healing Center of regression in wound condition at [phone]. Other Home Health Orders/Instructions: - HH needs to see patient 2 x times week of 06/10/23- 06/14/23, Monday and Wednesday, Week of 06/17/23-06/21/23 HH will see pt and change wraps 2 x on Monday and Thursday or Friday, pt will be seen in wound clinic again on Monday 06/24/23. Bathing/ Shower/ Hygiene May shower with wound dressing protected with water repellent cover or cast protector. No tub bath. Edema Control - Orders / Instructions Elevate, Exercise Daily and A void Standing for Long Periods of Time. Elevate legs to the level of the heart and pump ankles as often as possible Elevate leg(s) parallel to the floor when sitting. DO YOUR BEST to sleep in the bed at night. DO NOT sleep in your recliner. Long hours of sitting in a recliner leads to swelling of the legs and/or potential wounds on your backside. Non-Wound Condition Left Lower Extremity Cleanse affected area with antibacterial soap and water, pply appropriate compression. - Aquacel Extra to weeping areas, ABD and URGO K2 wrap size large, if do not have this brand of wrap please A place a 3 layer compression wrap. dditional non-wound orders/instructions: - TCA to left leg for itching A Medications-Please add to medication list. ntibiotics - continue previously prescribed antibiotic and take as prescribed P.O. A Wound Treatment Wound #1 - Lower Leg Wound  Laterality: Right Cleanser: Soap and Water 2 x Per Day/15 Days Discharge Instructions: Gently cleanse wound with antibacterial soap, rinse and pat dry prior to dressing wounds Peri-Wound Care: Triamcinolone Acetonide Cream, 0.1%, 15 (g) tube 2 x Per Day/15 Days Discharge Instructions: to leg for itching Apply as directed. Prim Dressing: Aquacel Extra Hydrofiber Dressing,  4x5 (in/in) ary 2 x Per Day/15 Days Secondary Dressing: ABD Pad 5x9 (in/in) 2 x Per Day/15 Days Discharge Instructions: Cover with ABD pad Compression Wrap: Urgo K2, two layer compression system, large 2 x Per Day/15 Days Electronic Signature(s) Signed: 06/07/2023 12:40:51 PM By: Angelina Pih Signed: 06/07/2023 12:54:47 PM By: [name] Derry PA-C Entered By: Angelina Pih on 06/07/2023 12:03:55 Cullen, [name] ([id number]) [id number] Page 4 of 7 -------------------------------------------------------------------------------- Problem List Details Patient Name: Date of Service: [name] [name], [name] [name] 06/07/2023 9:15 A M Medical Record Number: [id number] Patient Account Number: [id number] Date of Birth/Sex: Treating RN: June 18, 1951 (72 y.o. [name] [name] Primary Care Provider: Gershon [name] Other Clinician: Referring Provider: Treating Provider/Extender: [name] [name] in Treatment: 1 Active Problems ICD-10 Encounter Code Description Active Date MDM Diagnosis I89.0 Lymphedema, not elsewhere classified 05/28/2023 No Yes L97.812 Non-pressure chronic ulcer of other part of right lower leg with fat layer 05/28/2023 No Yes exposed L03.115 Cellulitis of right lower limb 05/28/2023 No Yes I87.331 Chronic venous hypertension (idiopathic) with ulcer and inflammation of right 05/28/2023 No Yes lower extremity Z96.651 Presence of right artificial knee joint 05/28/2023 No Yes Z96.652 Presence of left artificial knee joint 05/28/2023 No Yes G20.C Parkinsonism, unspecified  05/28/2023 No Yes Inactive Problems Resolved Problems Electronic Signature(s) Signed: 06/07/2023 9:25:47 AM By: [name] Derry PA-C Entered By: [name] [name] on 06/07/2023 09:25:47 Jaspers, [name] ([id number]) [id number] Page 5 of 7 -------------------------------------------------------------------------------- Progress Note Details Patient Name: Date of Service: [name] [name], [name] [name] 06/07/2023 9:15 A M Medical Record Number: [id number] Patient Account Number: [id number] Date of Birth/Sex: Treating RN: 04/07/1951 (72 y.o. [name] [name] Primary Care Provider: Gershon [name] Other Clinician: Referring Provider: Treating Provider/Extender: [name] [name] in Treatment: 1 Subjective Chief Complaint Information obtained from Patient Lymphedema with ulcer right LE History of Present Illness (HPI) Chronic/Inactive Condition: 05-28-2023 on evaluation today patient's ABIs on the left and right were 0.89 and he seems to have good arterial flow with good capillary refill less than 3 seconds and his pulses are palpable and 2+. 05-28-2023 upon evaluation patient presents today for initial inspection here in our clinic concerning issues he has been having with his bilateral lower extremities. He had his right knee replaced about 4 years ago and his left knee replaced just a couple of months back. With that being said since that time he has been having issues with leg swelling he states this happened 4 years ago and the other knee was replaced on the right as well. With that being said the knees are doing great. He has been on antibiotics most recently he was placed on doxycycline which he is still taking though to be honest I do not see any signs of him actually having an active infection at this point which is great news. He is here with his wife today and seems to be very interested in what he can do to improve his chances of getting this to heal and  stay healed. He does have a longstanding history of lower extremity swelling worse at times as noted above. Patient does have a history of stage III lymphedema, he has had cellulitis of this seems to be resolved at this point, chronic venous hypertension, bilateral total knee joint replacements, and Parkinson's disease. 06-07-2023 upon evaluation today patient actually appears to be making excellent progress in regard to his wounds. He has been tolerating the dressing changes actually feel like he could potentially be completely healed although this is something  that we are still going to monitor for another week I am not can call it yet. I think that we should continue with the compression wraps however and as well with the medication at this point. Objective Constitutional Well-nourished and well-hydrated in no acute distress. Vitals Time Taken: 9:14 AM, Height: 71 in, Weight: 240 lbs, BMI: 33.5, Temperature: 97.5 F, Pulse: 97 bpm, Respiratory Rate: 18 breaths/min, Blood Pressure: 146/88 mmHg. Respiratory normal breathing without difficulty. Psychiatric this patient is able to make decisions and demonstrates good insight into disease process. Alert and Oriented x 3. pleasant and cooperative. General Notes: Upon inspection patient's wound bed actually showed signs of good granulation and epithelization at this point. Fortunately I do not see any signs of worsening overall and I believe that the patient is making good headway here towards closure which is great news. Integumentary (Hair, Skin) Wound #1 status is Open. Original cause of wound was Gradually Appeared. The date acquired was: 03/24/2023. The wound has been in treatment 1 weeks. The wound is located on the Right Lower Leg. The wound measures 1.5cm length x 1cm width x 0.1cm depth; 1.178cm^2 area and 0.118cm^3 volume. There is no tunneling or undermining noted. There is a medium amount of serosanguineous drainage noted. There is large  (67-100%) pink, pale granulation within the wound bed. There is no necrotic tissue within the wound bed. Other Condition(s) Patient presents with Lymphedema located on the Left Leg. General Notes: no open areas noted [name] [name], [name] [name] ([id number]) [id number] Page 6 of 7 Assessment Active Problems ICD-10 Lymphedema, not elsewhere classified Non-pressure chronic ulcer of other part of right lower leg with fat layer exposed Cellulitis of right lower limb Chronic venous hypertension (idiopathic) with ulcer and inflammation of right lower extremity Presence of right artificial knee joint Presence of left artificial knee joint Parkinsonism, unspecified Procedures Wound #1 Pre-procedure diagnosis of Wound #1 is a Lymphedema located on the Right Lower Leg . There was a Double Layer Compression Therapy Procedure by Angelina Pih, RN. Post procedure Diagnosis Wound #1: Same as Pre-Procedure There was a Double Layer Compression Therapy Procedure by Angelina Pih, RN. Post procedure Diagnosis Wound #: Same as Pre-Procedure Plan Follow-up Appointments: Return Appointment in 2 weeks. Home Health: Home Health Company: - [facility] for wound care. May utilize formulary equivalent dressing for wound treatment orders unless otherwise specified. Home Health Nurse may visit PRN to address patients wound care needs. Scheduled days for dressing changes to be completed; exception, patient has scheduled wound care visit that day. **Please direct any NON-WOUND related issues/requests for orders to patient's Primary Care Physician. **If current dressing causes regression in wound condition, may D/C ordered dressing product/s and apply Normal Saline Moist Dressing daily until next Wound Healing Center or Other MD appointment. **Notify Wound Healing Center of regression in wound condition at [phone]. Other Home Health Orders/Instructions: - HH needs to  see patient 2 x times week of 06/10/23- 06/14/23, Monday and Wednesday, Week of 06/17/23-06/21/23 HH will see pt and change wraps 2 x on Monday and Thursday or Friday, pt will be seen in wound clinic again on Monday 06/24/23. Bathing/ Shower/ Hygiene: May shower with wound dressing protected with water repellent cover or cast protector. No tub bath. Edema Control - Orders / Instructions: Elevate, Exercise Daily and Avoid Standing for Long Periods of Time. Elevate legs to the level of the heart and pump ankles as often as possible Elevate leg(s) parallel to the floor when sitting. DO YOUR BEST  to sleep in the bed at night. DO NOT sleep in your recliner. Long hours of sitting in a recliner leads to swelling of the legs and/or potential wounds on your backside. Non-Wound Condition: Cleanse affected area with antibacterial soap and water, Apply appropriate compression. - Aquacel Extra to weeping areas, ABD and URGO K2 wrap size large, if do not have this brand of wrap please place a 3 layer compression wrap. Additional non-wound orders/instructions: - TCA to left leg for itching Medications-Please add to medication list.: P.O. Antibiotics - continue previously prescribed antibiotic and take as prescribed WOUND #1: - Lower Leg Wound Laterality: Right Cleanser: Soap and Water 2 x Per Day/15 Days Discharge Instructions: Gently cleanse wound with antibacterial soap, rinse and pat dry prior to dressing wounds Peri-Wound Care: Triamcinolone Acetonide Cream, 0.1%, 15 (g) tube 2 x Per Day/15 Days Discharge Instructions: to leg for itching Apply as directed. Prim Dressing: Aquacel Extra Hydrofiber Dressing, 4x5 (in/in) 2 x Per Day/15 Days ary Secondary Dressing: ABD Pad 5x9 (in/in) 2 x Per Day/15 Days Discharge Instructions: Cover with ABD pad Com pression Wrap: Urgo K2, two layer compression system, large 2 x Per Day/15 Days 1. Based on what I am seeing I do believe that the patient is making really  good headway here towards closure which is great news. I when I recommend we continue right now with the silver alginate I think this is still doing a good job. 2. I am going to recommend continue with the triamcinolone to the legs. 3. I am going to recommend the patient continue with Urgo K2 compression wraps which seem to be doing quite well for him. We will see patient back for reevaluation in 2 weeks here in the clinic. If anything worsens or changes patient will contact our office for additional recommendations. [name] [name], [name] [name] ([id number]) [id number] Page 7 of 7 Electronic Signature(s) Signed: 06/07/2023 12:48:04 PM By: [name] Derry PA-C Entered By: [name] [name] on 06/07/2023 12:48:04 -------------------------------------------------------------------------------- SuperBill Details Patient Name: Date of Service: [name] Poot EL J. 06/07/2023 Medical Record Number: [id number] Patient Account Number: [id number] Date of Birth/Sex: Treating RN: [birth year] (72 y.o. [name] [name] Primary Care Provider: Gershon [name] Other Clinician: Referring Provider: Treating Provider/Extender: [name] [name] in Treatment: 1 Diagnosis Coding ICD-10 Codes Code Description I89.0 Lymphedema, not elsewhere classified L97.812 Non-pressure chronic ulcer of other part of right lower leg with fat layer exposed L03.115 Cellulitis of right lower limb I87.331 Chronic venous hypertension (idiopathic) with ulcer and inflammation of right lower extremity Z96.651 Presence of right artificial knee joint Z96.652 Presence of left artificial knee joint G20.C Parkinsonism, unspecified Facility Procedures : CPT4: Code [id number] Description: 81 BILATERAL: Application of multi-layer venous compression system; leg (below knee), including ankle and t. Modifier: Quantity: 1 Physician Procedures : CPT4 Code Description Modifier [id number] - WC PHYS LEVEL 3  - EST PT ICD-10 Diagnosis Description I89.0 Lymphedema, not elsewhere classified L97.812 Non-pressure chronic ulcer of other part of right lower leg with fat layer exposed L03.115  Cellulitis of right lower limb I87.331 Chronic venous hypertension (idiopathic) with ulcer and inflammation of right lower extremity Quantity: 1 Electronic Signature(s) Signed: 06/07/2023 12:54:12 PM By: [name] Derry PA-C Previous Signature: 06/07/2023 11:13:27 AM Version By: Angelina Pih Entered By: [name] [name] on 06/07/2023 12:54:12

## 2023-06-10 ENCOUNTER — Encounter (HOSPITAL_BASED_OUTPATIENT_CLINIC_OR_DEPARTMENT_OTHER): Payer: Medicare Other | Admitting: General Surgery

## 2023-06-10 DIAGNOSIS — L97812 Non-pressure chronic ulcer of other part of right lower leg with fat layer exposed: Secondary | ICD-10-CM | POA: Diagnosis not present

## 2023-06-10 DIAGNOSIS — G20C Parkinsonism, unspecified: Secondary | ICD-10-CM | POA: Diagnosis not present

## 2023-06-10 DIAGNOSIS — L03116 Cellulitis of left lower limb: Secondary | ICD-10-CM | POA: Diagnosis not present

## 2023-06-10 DIAGNOSIS — L03115 Cellulitis of right lower limb: Secondary | ICD-10-CM | POA: Diagnosis not present

## 2023-06-10 DIAGNOSIS — I890 Lymphedema, not elsewhere classified: Secondary | ICD-10-CM | POA: Diagnosis not present

## 2023-06-10 DIAGNOSIS — I87331 Chronic venous hypertension (idiopathic) with ulcer and inflammation of right lower extremity: Secondary | ICD-10-CM | POA: Diagnosis not present

## 2023-06-12 DIAGNOSIS — I87331 Chronic venous hypertension (idiopathic) with ulcer and inflammation of right lower extremity: Secondary | ICD-10-CM | POA: Diagnosis not present

## 2023-06-12 DIAGNOSIS — L03116 Cellulitis of left lower limb: Secondary | ICD-10-CM | POA: Diagnosis not present

## 2023-06-12 DIAGNOSIS — L03115 Cellulitis of right lower limb: Secondary | ICD-10-CM | POA: Diagnosis not present

## 2023-06-12 DIAGNOSIS — G20C Parkinsonism, unspecified: Secondary | ICD-10-CM | POA: Diagnosis not present

## 2023-06-12 DIAGNOSIS — L97812 Non-pressure chronic ulcer of other part of right lower leg with fat layer exposed: Secondary | ICD-10-CM | POA: Diagnosis not present

## 2023-06-12 DIAGNOSIS — I890 Lymphedema, not elsewhere classified: Secondary | ICD-10-CM | POA: Diagnosis not present

## 2023-06-14 DIAGNOSIS — G20C Parkinsonism, unspecified: Secondary | ICD-10-CM | POA: Diagnosis not present

## 2023-06-14 DIAGNOSIS — L03115 Cellulitis of right lower limb: Secondary | ICD-10-CM | POA: Diagnosis not present

## 2023-06-14 DIAGNOSIS — I890 Lymphedema, not elsewhere classified: Secondary | ICD-10-CM | POA: Diagnosis not present

## 2023-06-14 DIAGNOSIS — I87331 Chronic venous hypertension (idiopathic) with ulcer and inflammation of right lower extremity: Secondary | ICD-10-CM | POA: Diagnosis not present

## 2023-06-14 DIAGNOSIS — L03116 Cellulitis of left lower limb: Secondary | ICD-10-CM | POA: Diagnosis not present

## 2023-06-14 DIAGNOSIS — L97812 Non-pressure chronic ulcer of other part of right lower leg with fat layer exposed: Secondary | ICD-10-CM | POA: Diagnosis not present

## 2023-06-17 DIAGNOSIS — I890 Lymphedema, not elsewhere classified: Secondary | ICD-10-CM | POA: Diagnosis not present

## 2023-06-17 DIAGNOSIS — L03116 Cellulitis of left lower limb: Secondary | ICD-10-CM | POA: Diagnosis not present

## 2023-06-17 DIAGNOSIS — L03115 Cellulitis of right lower limb: Secondary | ICD-10-CM | POA: Diagnosis not present

## 2023-06-17 DIAGNOSIS — I87331 Chronic venous hypertension (idiopathic) with ulcer and inflammation of right lower extremity: Secondary | ICD-10-CM | POA: Diagnosis not present

## 2023-06-17 DIAGNOSIS — L97812 Non-pressure chronic ulcer of other part of right lower leg with fat layer exposed: Secondary | ICD-10-CM | POA: Diagnosis not present

## 2023-06-17 DIAGNOSIS — G20C Parkinsonism, unspecified: Secondary | ICD-10-CM | POA: Diagnosis not present

## 2023-06-19 DIAGNOSIS — L03115 Cellulitis of right lower limb: Secondary | ICD-10-CM | POA: Diagnosis not present

## 2023-06-19 DIAGNOSIS — I87331 Chronic venous hypertension (idiopathic) with ulcer and inflammation of right lower extremity: Secondary | ICD-10-CM | POA: Diagnosis not present

## 2023-06-19 DIAGNOSIS — G20C Parkinsonism, unspecified: Secondary | ICD-10-CM | POA: Diagnosis not present

## 2023-06-19 DIAGNOSIS — L97812 Non-pressure chronic ulcer of other part of right lower leg with fat layer exposed: Secondary | ICD-10-CM | POA: Diagnosis not present

## 2023-06-19 DIAGNOSIS — I890 Lymphedema, not elsewhere classified: Secondary | ICD-10-CM | POA: Diagnosis not present

## 2023-06-19 DIAGNOSIS — L03116 Cellulitis of left lower limb: Secondary | ICD-10-CM | POA: Diagnosis not present

## 2023-06-20 ENCOUNTER — Other Ambulatory Visit: Payer: Self-pay

## 2023-06-20 ENCOUNTER — Telehealth: Payer: Self-pay | Admitting: Family Medicine

## 2023-06-20 MED ORDER — POTASSIUM CHLORIDE ER 10 MEQ PO TBCR: EXTENDED_RELEASE_TABLET | ORAL | 1 refills | Status: DC

## 2023-06-20 NOTE — Telephone Encounter (Signed)
Prescription Request  06/20/2023  LOV: 05/07/2023  What is the name of the medication or equipment?  potassium chloride potassium chloride (KLOR-CON) 10 MEQ tablet  Have you contacted your pharmacy to request a refill? No   Which pharmacy would you like this sent to?   [facility] - [place], [place] - [address] [place] [address] Phone: [phone] Fax: [phone]  Patient notified that their request is being sent to the clinical staff for review and that they should receive a response within 2 business days.   Please advise at Mobile [phone] (mobile)

## 2023-06-21 ENCOUNTER — Telehealth: Payer: Self-pay | Admitting: Family Medicine

## 2023-06-21 DIAGNOSIS — L03115 Cellulitis of right lower limb: Secondary | ICD-10-CM | POA: Diagnosis not present

## 2023-06-21 DIAGNOSIS — G20C Parkinsonism, unspecified: Secondary | ICD-10-CM | POA: Diagnosis not present

## 2023-06-21 DIAGNOSIS — I87331 Chronic venous hypertension (idiopathic) with ulcer and inflammation of right lower extremity: Secondary | ICD-10-CM | POA: Diagnosis not present

## 2023-06-21 DIAGNOSIS — L97812 Non-pressure chronic ulcer of other part of right lower leg with fat layer exposed: Secondary | ICD-10-CM | POA: Diagnosis not present

## 2023-06-21 DIAGNOSIS — I890 Lymphedema, not elsewhere classified: Secondary | ICD-10-CM | POA: Diagnosis not present

## 2023-06-21 DIAGNOSIS — L03116 Cellulitis of left lower limb: Secondary | ICD-10-CM | POA: Diagnosis not present

## 2023-06-21 NOTE — Telephone Encounter (Signed)
FYI

## 2023-06-21 NOTE — Telephone Encounter (Signed)
Charlene from [facility] call and stated pt had a fall but was not injured.Charlene's # is [phone]

## 2023-06-24 ENCOUNTER — Encounter: Payer: Medicare Other | Attending: Physician Assistant | Admitting: Physician Assistant

## 2023-06-24 DIAGNOSIS — L03115 Cellulitis of right lower limb: Secondary | ICD-10-CM | POA: Diagnosis not present

## 2023-06-24 DIAGNOSIS — I87331 Chronic venous hypertension (idiopathic) with ulcer and inflammation of right lower extremity: Secondary | ICD-10-CM | POA: Insufficient documentation

## 2023-06-24 DIAGNOSIS — G20A1 Parkinson's disease without dyskinesia, without mention of fluctuations: Secondary | ICD-10-CM | POA: Diagnosis not present

## 2023-06-24 DIAGNOSIS — J45909 Unspecified asthma, uncomplicated: Secondary | ICD-10-CM | POA: Insufficient documentation

## 2023-06-24 DIAGNOSIS — I890 Lymphedema, not elsewhere classified: Secondary | ICD-10-CM | POA: Diagnosis not present

## 2023-06-24 DIAGNOSIS — L97812 Non-pressure chronic ulcer of other part of right lower leg with fat layer exposed: Secondary | ICD-10-CM | POA: Insufficient documentation

## 2023-06-24 DIAGNOSIS — Z79899 Other long term (current) drug therapy: Secondary | ICD-10-CM | POA: Diagnosis not present

## 2023-06-24 DIAGNOSIS — M1990 Unspecified osteoarthritis, unspecified site: Secondary | ICD-10-CM | POA: Diagnosis not present

## 2023-06-24 DIAGNOSIS — Z96653 Presence of artificial knee joint, bilateral: Secondary | ICD-10-CM | POA: Diagnosis not present

## 2023-06-24 NOTE — Progress Notes (Signed)
ARMONE, DRAKES ([id number]) [id number] Page 1 of 7 Visit Report for 06/24/2023 Chief Complaint Document Details Patient Name: Date of Service: [name] [name], JOBIN 06/24/2023 10:30 A M Medical Record Number: [id number] Patient Account Number: [id number] Date of Birth/Sex: Treating RN: 1950-12-10 (72 y.o. [name] [name] Primary Care Provider: Gershon Crane Other Clinician: Referring Provider: Treating Provider/Extender: Harvel Ricks in Treatment: 3 Information Obtained from: Patient Chief Complaint Lymphedema with ulcer right LE Electronic Signature(s) Signed: 06/24/2023 10:23:06 AM By: Allen Derry PA-C Entered By: Allen Derry on 06/24/2023 10:23:05 -------------------------------------------------------------------------------- HPI Details Patient Name: Date of Service: [name] [name] EL J. 06/24/2023 10:30 A M Medical Record Number: [id number] Patient Account Number: [id number] Date of Birth/Sex: Treating RN: [birth year] (72 y.o. [name] [name] Primary Care Provider: Gershon Crane Other Clinician: Referring Provider: Treating Provider/Extender: Harvel Ricks in Treatment: 3 History of Present Illness Chronic/Inactive Conditions Condition 1: 05-28-2023 on evaluation today patient's ABIs on the left and right were 0.89 and he seems to have good arterial flow with good capillary refill less than 3 seconds and his pulses are palpable and 2+. HPI Description: 05-28-2023 upon evaluation patient presents today for initial inspection here in our clinic concerning issues he has been having with his bilateral lower extremities. He had his right knee replaced about 4 years ago and his left knee replaced just a couple of months back. With that being said since that time he has been having issues with leg swelling he states this happened 4 years ago and the other knee was replaced on the right as well. With that  being said the knees are doing great. He has been on antibiotics most recently he was placed on doxycycline which he is still taking though to be honest I do not see any signs of him actually having an active infection at this point which is great news. He is here with his wife today and seems to be very interested in what he can do to improve his chances of getting this to heal and stay healed. He does have a longstanding history of lower extremity swelling worse at times as noted above. Patient does have a history of stage III lymphedema, he has had cellulitis of this seems to be resolved at this point, chronic venous hypertension, bilateral total knee joint replacements, and Parkinson's disease. 06-07-2023 upon evaluation today patient actually appears to be making excellent progress in regard to his wounds. He has been tolerating the dressing changes actually feel like he could potentially be completely healed although this is something that we are still going to monitor for another week I am not can call it yet. I think that we should continue with the compression wraps however and as well with the medication at this point. [name], [name] ([id number]) [id number] Page 2 of 7 06-24-2023 upon evaluation patient appears to be doing excellent at this point in regard to his legs which are showing good improvement with regard to the wounds. He has been seen by home health 3 times per week they have been helping with the compression wrapping. With that being said he has now been under my care with compression wrapping for the 28-day cycle with regard to evaluation for lymphedema pumps. I do believe that he still has significant swelling although it is better controlled with the compression wraps he also has the juxta fit compression wraps at this point. With that being said he does not have any signs of cellulitis or  infection which is good news and in regard to his  thighs he still has quite a bit of swelling up there as well I really believe that lymphedema pumps would be the key factor here to help with everything else he is doing. He has been exercising that is specifically walking. He has also been doing what he can as far as elevating his legs whenever he has the opportunity and I think he is doing a good job in that regard as well. We have been compressing him for the past 28 days (4 weeks) and he does seem to be doing better though he still has quite a bit of swelling even with the 4-layer equivalent compression wraps. He has stage II lymphedema I do not believe this has progressed to stage III at this point. And then despite conservative treatments including the exercise, elevation, and compression I still feel like that he would benefit from the lymphedema pumps in order to help control his edema. He is also on fluid pills at this point. Electronic Signature(s) Signed: 06/24/2023 2:39:47 PM By: Allen Derry PA-C Entered By: Allen Derry on 06/24/2023 14:39:47 -------------------------------------------------------------------------------- Physical Exam Details Patient Name: Date of Service: [name] [name], [name] [name] EL J. 06/24/2023 10:30 A M Medical Record Number: [id number] Patient Account Number: [id number] Date of Birth/Sex: Treating RN: 1950-09-18 (72 y.o. [name] [name] Primary Care Provider: Gershon Crane Other Clinician: Referring Provider: Treating Provider/Extender: Harvel Ricks in Treatment: 3 Constitutional Obese and well-hydrated in no acute distress. Respiratory normal breathing without difficulty. Psychiatric this patient is able to make decisions and demonstrates good insight into disease process. Alert and Oriented x 3. pleasant and cooperative. Notes Upon inspection patient's wound bed actually showed signs of being completely healed and very pleased in that regard. That is definitely not an issue at this point I  think the biggest issue that we see has really nothing much to do with the wound some cells but really more to do with the lymphedema. He still has swelling all the way up to the thighs I do believe that he would benefit from the use of lymphedema pumps. He voiced understanding he is in agreement with the plan is now been through the 28-day trial and still has what it is in my opinion the perceived need for leg lymphedema pumps I think that we should proceed as such. Electronic Signature(s) Signed: 06/24/2023 2:40:23 PM By: Allen Derry PA-C Entered By: Allen Derry on 06/24/2023 14:40:23 -------------------------------------------------------------------------------- Physician Orders Details Patient Name: Date of Service: JAYSTEN, ROCKMAN EL J. 06/24/2023 10:30 A Alvina Chou ([id number]) [id number] Page 3 of 7 Medical Record Number: [id number] Patient Account Number: [id number] Date of Birth/Sex: Treating RN: September 08, 1950 (72 y.o. [name] [name] Primary Care Provider: Gershon Crane Other Clinician: Referring Provider: Treating Provider/Extender: Harvel Ricks in Treatment: 3 The following information was scribed by: Angelina Pih The information was scribed for: Allen Derry Verbal / Phone Orders: No Diagnosis Coding ICD-10 Coding Code Description I89.0 Lymphedema, not elsewhere classified L97.812 Non-pressure chronic ulcer of other part of right lower leg with fat layer exposed L03.115 Cellulitis of right lower limb I87.331 Chronic venous hypertension (idiopathic) with ulcer and inflammation of right lower extremity Z96.651 Presence of right artificial knee joint Z96.652 Presence of left artificial knee joint G20.C Parkinsonism, unspecified Follow-up Appointments Return Appointment in 1 week. Home Health Home Health Company: - [facility] for wound care. May utilize formulary equivalent dressing for wound  treatment orders  unless otherwise specified. Home Health Nurse may visit PRN to address patients wound care needs. Scheduled days for dressing changes to be completed; exception, patient has scheduled wound care visit that day. **Please direct any NON-WOUND related issues/requests for orders to patient's Primary Care Physician. **If current dressing causes regression in wound condition, may D/C ordered dressing product/s and apply Normal Saline Moist Dressing daily until next Wound Healing Center or Other MD appointment. **Notify Wound Healing Center of regression in wound condition at [phone]. Bathing/ Shower/ Hygiene May shower with wound dressing protected with water repellent cover or cast protector. No tub bath. Edema Control - Orders / Instructions Elevate, Exercise Daily and A void Standing for Long Periods of Time. Patient to wear own Velcro compression garment. Remove compression stockings every night before going to bed and put on every morning when getting up. - Apply daily first thing in the morning and remove nightly prior to going to bed. Elevate legs to the level of the heart and pump ankles as often as possible Elevate leg(s) parallel to the floor when sitting. DO YOUR BEST to sleep in the bed at night. DO NOT sleep in your recliner. Long hours of sitting in a recliner leads to swelling of the legs and/or potential wounds on your backside. Non-Wound Condition Left Lower Extremity Cleanse affected area with antibacterial soap and water, dditional non-wound orders/instructions: - A and D ointment to bilateral legs nightly when velcro wraps removed. TCA ointment can be used a A week at time if itching arises but does not need to be used daily. Electronic Signature(s) Signed: 06/24/2023 4:09:48 PM By: Demetria Pore Signed: 06/24/2023 4:14:58 PM By: Angelina Pih Signed: 06/26/2023 7:54:12 PM By: Allen Derry PA-C Entered By: Angelina Pih on 06/24/2023  12:26:45 -------------------------------------------------------------------------------- Problem List Details Patient Name: Date of Service: [name] [name] EL J. 06/24/2023 10:30 A M Medical Record Number: [id number] Patient Account Number: [id number] KOL, HARDERS ([id number]) [id number] Page 4 of 7 Date of Birth/Sex: Treating RN: 10/12/1950 (72 y.o. [name] [name] Primary Care Provider: Gershon Crane Other Clinician: Referring Provider: Treating Provider/Extender: Harvel Ricks in Treatment: 3 Active Problems ICD-10 Encounter Code Description Active Date MDM Diagnosis I89.0 Lymphedema, not elsewhere classified 05/28/2023 No Yes L97.812 Non-pressure chronic ulcer of other part of right lower leg with fat layer 05/28/2023 No Yes exposed L03.115 Cellulitis of right lower limb 05/28/2023 No Yes I87.331 Chronic venous hypertension (idiopathic) with ulcer and inflammation of right 05/28/2023 No Yes lower extremity Z96.651 Presence of right artificial knee joint 05/28/2023 No Yes Z96.652 Presence of left artificial knee joint 05/28/2023 No Yes G20.C Parkinsonism, unspecified 05/28/2023 No Yes Inactive Problems Resolved Problems Electronic Signature(s) Signed: 06/24/2023 10:22:57 AM By: Allen Derry PA-C Entered By: Allen Derry on 06/24/2023 10:22:57 -------------------------------------------------------------------------------- Progress Note Details Patient Name: Date of Service: [name] [name] EL J. 06/24/2023 10:30 A M Medical Record Number: [id number] Patient Account Number: [id number] Date of Birth/Sex: Treating RN: 1950-11-07 (72 y.o. [name] [name] Primary Care Provider: Gershon Crane Other Clinician: Referring Provider: Treating Provider/Extender: Harvel Ricks in Treatment: 3 Subjective Magner, [name] ([id number]) [id number] Page 5 of 7 Chief  Complaint Information obtained from Patient Lymphedema with ulcer right LE History of Present Illness (HPI) Chronic/Inactive Condition: 05-28-2023 on evaluation today patient's ABIs on the left and right were 0.89 and he seems to have good arterial flow with good capillary refill less than 3 seconds and his pulses are palpable and 2+. 05-28-2023 upon evaluation patient presents today  for initial inspection here in our clinic concerning issues he has been having with his bilateral lower extremities. He had his right knee replaced about 4 years ago and his left knee replaced just a couple of months back. With that being said since that time he has been having issues with leg swelling he states this happened 4 years ago and the other knee was replaced on the right as well. With that being said the knees are doing great. He has been on antibiotics most recently he was placed on doxycycline which he is still taking though to be honest I do not see any signs of him actually having an active infection at this point which is great news. He is here with his wife today and seems to be very interested in what he can do to improve his chances of getting this to heal and stay healed. He does have a longstanding history of lower extremity swelling worse at times as noted above. Patient does have a history of stage III lymphedema, he has had cellulitis of this seems to be resolved at this point, chronic venous hypertension, bilateral total knee joint replacements, and Parkinson's disease. 06-07-2023 upon evaluation today patient actually appears to be making excellent progress in regard to his wounds. He has been tolerating the dressing changes actually feel like he could potentially be completely healed although this is something that we are still going to monitor for another week I am not can call it yet. I think that we should continue with the compression wraps however and as well with the medication at this  point. 06-24-2023 upon evaluation patient appears to be doing excellent at this point in regard to his legs which are showing good improvement with regard to the wounds. He has been seen by home health 3 times per week they have been helping with the compression wrapping. With that being said he has now been under my care with compression wrapping for the 28-day cycle with regard to evaluation for lymphedema pumps. I do believe that he still has significant swelling although it is better controlled with the compression wraps he also has the juxta fit compression wraps at this point. With that being said he does not have any signs of cellulitis or infection which is good news and in regard to his thighs he still has quite a bit of swelling up there as well I really believe that lymphedema pumps would be the key factor here to help with everything else he is doing. He has been exercising that is specifically walking. He has also been doing what he can as far as elevating his legs whenever he has the opportunity and I think he is doing a good job in that regard as well. We have been compressing him for the past 28 days (4 weeks) and he does seem to be doing better though he still has quite a bit of swelling even with the 4-layer equivalent compression wraps. He has stage II lymphedema I do not believe this has progressed to stage III at this point. And then despite conservative treatments including the exercise, elevation, and compression I still feel like that he would benefit from the lymphedema pumps in order to help control his edema. He is also on fluid pills at this point. Objective Constitutional Obese and well-hydrated in no acute distress. Vitals Time Taken: 11:00 AM, Height: 71 in, Weight: 240 lbs, BMI: 33.5, Temperature: 97.7 F, Pulse: 93 bpm, Respiratory Rate: 18 breaths/min, Blood Pressure:  141/79 mmHg. Respiratory normal breathing without difficulty. Psychiatric this patient is able to  make decisions and demonstrates good insight into disease process. Alert and Oriented x 3. pleasant and cooperative. General Notes: Upon inspection patient's wound bed actually showed signs of being completely healed and very pleased in that regard. That is definitely not an issue at this point I think the biggest issue that we see has really nothing much to do with the wound some cells but really more to do with the lymphedema. He still has swelling all the way up to the thighs I do believe that he would benefit from the use of lymphedema pumps. He voiced understanding he is in agreement with the plan is now been through the 28-day trial and still has what it is in my opinion the perceived need for leg lymphedema pumps I think that we should proceed as such. Integumentary (Hair, Skin) Wound #1 status is Open. Original cause of wound was Gradually Appeared. The date acquired was: 03/24/2023. The wound has been in treatment 3 weeks. The wound is located on the Right Lower Leg. The wound measures 0cm length x 0cm width x 0cm depth; 0cm^2 area and 0cm^3 volume. There is no tunneling or undermining noted. There is a none present amount of drainage noted. There is no granulation within the wound bed. There is no necrotic tissue within the wound bed. Assessment Active Problems ICD-10 Lymphedema, not elsewhere classified Non-pressure chronic ulcer of other part of right lower leg with fat layer exposed Cellulitis of right lower limb Chronic venous hypertension (idiopathic) with ulcer and inflammation of right lower extremity Presence of right artificial knee joint Presence of left artificial knee joint Parkinsonism, unspecified MANBIR, [name] [name] ([id number]) [id number] Page 6 of 7 Plan Follow-up Appointments: Return Appointment in 1 week. Home Health: Home Health Company: - [facility] for wound care. May utilize formulary equivalent dressing for  wound treatment orders unless otherwise specified. Home Health Nurse may visit PRN to address patients wound care needs. Scheduled days for dressing changes to be completed; exception, patient has scheduled wound care visit that day. **Please direct any NON-WOUND related issues/requests for orders to patient's Primary Care Physician. **If current dressing causes regression in wound condition, may D/C ordered dressing product/s and apply Normal Saline Moist Dressing daily until next Wound Healing Center or Other MD appointment. **Notify Wound Healing Center of regression in wound condition at [phone]. Bathing/ Shower/ Hygiene: May shower with wound dressing protected with water repellent cover or cast protector. No tub bath. Edema Control - Orders / Instructions: Elevate, Exercise Daily and Avoid Standing for Long Periods of Time. Patient to wear own Velcro compression garment. Remove compression stockings every night before going to bed and put on every morning when getting up. - Apply daily first thing in the morning and remove nightly prior to going to bed. Elevate legs to the level of the heart and pump ankles as often as possible Elevate leg(s) parallel to the floor when sitting. DO YOUR BEST to sleep in the bed at night. DO NOT sleep in your recliner. Long hours of sitting in a recliner leads to swelling of the legs and/or potential wounds on your backside. Non-Wound Condition: Cleanse affected area with antibacterial soap and water, Additional non-wound orders/instructions: - A and D ointment to bilateral legs nightly when velcro wraps removed. TCA ointment can be used a week at time if itching arises but does not need to be used daily. 1. Patient does have  stage II lymphedema and subsequently despite conservative treatments including exercise, elevation, compression which we have been monitoring and actually performing along with home health over the past 4 weeks, and elevation he  continues to have significant issues with swelling especially up into his thighs although the lower extremities are much better controlled than when he first came in. 2. I am going to going see about ordering him lymphedema pumps. I think that this would be beneficial for him and I think that this is the appropriate way to go at this point as far as ongoing management the patient is in agreement with the plan and will get a see how things do in that regard. We will send the information into [facility]. We will see patient back for reevaluation in 1 week here in the clinic. If anything worsens or changes patient will contact our office for additional recommendations. Electronic Signature(s) Signed: 06/24/2023 2:41:32 PM By: Allen Derry PA-C Entered By: Allen Derry on 06/24/2023 14:41:32 -------------------------------------------------------------------------------- SuperBill Details Patient Name: Date of Service: [name] [name] EL J. 06/24/2023 Medical Record Number: [id number] Patient Account Number: [id number] Date of Birth/Sex: Treating RN: 08-02-1950 (72 y.o. [name] [name] Primary Care Provider: Gershon Crane Other Clinician: Referring Provider: Treating Provider/Extender: Harvel Ricks in Treatment: 3 Diagnosis Coding ICD-10 Codes Code Description I89.0 Lymphedema, not elsewhere classified L97.812 Non-pressure chronic ulcer of other part of right lower leg with fat layer exposed L03.115 Cellulitis of right lower limb I87.331 Chronic venous hypertension (idiopathic) with ulcer and inflammation of right lower extremity JQUAN, [name] [name] ([id number]) [id number] Page 7 of 7 Z96.651 Presence of right artificial knee joint Z96.652 Presence of left artificial knee joint G20.C Parkinsonism, unspecified Facility Procedures : CPT4 Code: [id number] Description: [phone] - WOUND CARE VISIT-LEV 2 EST PT Modifier: Quantity: 1 Physician  Procedures : CPT4 Code Description Modifier [id number] - WC PHYS LEVEL 4 - EST PT ICD-10 Diagnosis Description I89.0 Lymphedema, not elsewhere classified L97.812 Non-pressure chronic ulcer of other part of right lower leg with fat layer exposed L03.115  Cellulitis of right lower limb I87.331 Chronic venous hypertension (idiopathic) with ulcer and inflammation of right lower extremity Quantity: 1 Electronic Signature(s) Signed: 06/24/2023 2:41:44 PM By: Allen Derry PA-C Previous Signature: 06/24/2023 12:27:19 PM Version By: Angelina Pih Entered By: Allen Derry on 06/24/2023 14:41:44

## 2023-06-24 NOTE — Progress Notes (Addendum)
ARYEH, GASPARIAN ([id number]) [id number] Page 1 of 8 Visit Report for 06/24/2023 Arrival Information Details Patient Name: Date of Service: [name] [name], [name] [name] 06/24/2023 10:30 A M Medical Record Number: [id number] Patient Account Number: [id number] Date of Birth/Sex: Treating RN: October 23, 1950 (72 y.o. [name] [name] Primary Care [name] [name]: [name] [name] Other Clinician: Referring [name] [name]: Treating Jahmya Onofrio/Extender: [name] [name] in Treatment: 3 Visit Information History Since Last Visit Added or deleted any medications: No Patient Arrived: [name] [name] Any new allergies or adverse reactions: No Arrival Time: 11:00 Had a fall or experienced change in No Accompanied By: family activities of daily living that may affect Transfer Assistance: None risk of falls: Patient Identification Verified: Yes Hospitalized since last visit: No Secondary Verification Process Completed: Yes Has Dressing in Place as Prescribed: Yes Pain Present Now: No Electronic Signature(s) Signed: 06/24/2023 11:11:16 AM By: [name] [name] Entered By: [name] [name] on 06/24/2023 11:11:16 -------------------------------------------------------------------------------- Clinic Level of Care Assessment Details Patient Name: Date of Service: [name] [name], [name] [name] 06/24/2023 10:30 A M Medical Record Number: [id number] Patient Account Number: [id number] Date of Birth/Sex: Treating RN: [birth year] (72 y.o. [name] [name] Primary Care Clester Chlebowski: [name] [name] Other Clinician: Referring Castulo Scarpelli: Treating Tjay Velazquez/Extender: [name] [name] in Treatment: 3 Clinic Level of Care Assessment Items TOOL 4 Quantity Score []  - 0 Use when only an EandM is performed on FOLLOW-UP visit ASSESSMENTS - Nursing Assessment / Reassessment X- 1 10 Reassessment of Co-morbidities (includes updates in patient status) X- 1 5 Reassessment of Adherence to Treatment  Plan ASSESSMENTS - Wound and Skin A ssessment / Reassessment []  - 0 Simple Wound Assessment / Reassessment - one wound []  - 0 Complex Wound Assessment / Reassessment - multiple wounds [name] [name], [name] [name] ([id number]) [id number] Page 2 of 8 X- 1 10 Dermatologic / Skin Assessment (not related to wound area) ASSESSMENTS - Focused Assessment X- 1 5 Circumferential Edema Measurements - multi extremities []  - 0 Nutritional Assessment / Counseling / Intervention []  - 0 Lower Extremity Assessment (monofilament, tuning fork, pulses) []  - 0 Peripheral Arterial Disease Assessment (using hand held doppler) ASSESSMENTS - Ostomy and/or Continence Assessment and Care []  - 0 Incontinence Assessment and Management []  - 0 Ostomy Care Assessment and Management (repouching, etc.) PROCESS - Coordination of Care X - Simple Patient / Family Education for ongoing care 1 15 []  - 0 Complex (extensive) Patient / Family Education for ongoing care X- 1 10 Staff obtains Chiropractor, Records, T Results / Process Orders est []  - 0 Staff telephones HHA, Nursing Homes / Clarify orders / etc []  - 0 Routine Transfer to another Facility (non-emergent condition) []  - 0 Routine Hospital Admission (non-emergent condition) []  - 0 New Admissions / Manufacturing engineer / Ordering NPWT Apligraf, etc. , []  - 0 Emergency Hospital Admission (emergent condition) X- 1 10 Simple Discharge Coordination []  - 0 Complex (extensive) Discharge Coordination PROCESS - Special Needs []  - 0 Pediatric / Minor Patient Management []  - 0 Isolation Patient Management []  - 0 Hearing / Language / Visual special needs []  - 0 Assessment of Community assistance (transportation, D/C planning, etc.) []  - 0 Additional assistance / Altered mentation []  - 0 Support Surface(s) Assessment (bed, cushion, seat, etc.) INTERVENTIONS - Wound Cleansing / Measurement []  - 0 Simple Wound Cleansing - one wound []   - 0 Complex Wound Cleansing - multiple wounds []  - 0 Wound Imaging (photographs - any number of wounds) []  - 0 Wound Tracing (instead of photographs) []  - 0 Simple Wound Measurement -  one wound []  - 0 Complex Wound Measurement - multiple wounds INTERVENTIONS - Wound Dressings []  - 0 Small Wound Dressing one or multiple wounds []  - 0 Medium Wound Dressing one or multiple wounds []  - 0 Large Wound Dressing one or multiple wounds []  - 0 Application of Medications - topical []  - 0 Application of Medications - injection INTERVENTIONS - Miscellaneous []  - 0 External ear exam []  - 0 Specimen Collection (cultures, biopsies, blood, body fluids, etc.) []  - 0 Specimen(s) / Culture(s) sent or taken to Lab for analysis [name] [name], [name] [name] ([id number]) [id number] Page 3 of 8 []  - 0 Patient Transfer (multiple staff / Nurse, adult / Similar devices) []  - 0 Simple Staple / Suture removal (25 or less) []  - 0 Complex Staple / Suture removal (26 or more) []  - 0 Hypo / Hyperglycemic Management (close monitor of Blood Glucose) []  - 0 Ankle / Brachial Index (ABI) - do not check if billed separately X- 1 5 Vital Signs Has the patient been seen at the hospital within the last three years: Yes Total Score: 70 Level Of Care: New/Established - Level 2 Electronic Signature(s) Signed: 06/24/2023 4:14:58 PM By: [name] [name] Entered By: [name] [name] on 06/24/2023 12:27:14 -------------------------------------------------------------------------------- Encounter Discharge Information Details Patient Name: Date of Service: [name] Poot EL J. 06/24/2023 10:30 A M Medical Record Number: [id number] Patient Account Number: [id number] Date of Birth/Sex: Treating RN: 1951/04/01 (72 y.o. [name] [name] Primary Care Mayola Mcbain: [name] [name] Other Clinician: Referring Shawnetta Lein: Treating Jing Howatt/Extender: [name] [name] in Treatment: 3 Encounter  Discharge Information Items Discharge Condition: Stable Ambulatory Status: Cane Discharge Destination: Home Transportation: Private Auto Accompanied By: family Schedule Follow-up Appointment: No Clinical Summary of Care: Electronic Signature(s) Signed: 06/24/2023 4:04:29 PM By: [name] [name] Entered By: [name] [name] on 06/24/2023 16:04:29 -------------------------------------------------------------------------------- Lower Extremity Assessment Details Patient Name: Date of Service: [name] [name], [name] [name]. 06/24/2023 10:30 A M Medical Record Number: [id number] Patient Account Number: [id number] Date of Birth/Sex: Treating RN: 28-Dec-1950 (72 y.o. [name] [name] Primary Care Maliea Grandmaison: [name] [name] Other Clinician: Referring Aubria Vanecek: Treating Casin Federici/Extender: Thienan, Persons ([id number]) [id number] Page 4 of 8 Weeks in Treatment: 3 Edema Assessment Assessed: [Left: No] [Right: No] Edema: [Left: Yes] [Right: No] Calf Left: Right: Point of Measurement: 36 cm From Medial Instep 47.3 cm 43 cm Ankle Left: Right: Point of Measurement: 12 cm From Medial Instep 27.5 cm 25.5 cm Vascular Assessment Pulses: Dorsalis Pedis Palpable: [Left:Yes] [Right:Yes] Extremity colors, hair growth, and conditions: Extremity Color: [Left:Red] [Right:Red] Hair Growth on Extremity: [Left:No] [Right:No] Temperature of Extremity: [Left:Warm < 3 seconds] [Right:Warm < 3 seconds] Toe Nail Assessment Left: Right: Thick: No No Discolored: No No Deformed: No No Improper Length and Hygiene: No No Electronic Signature(s) Signed: 06/24/2023 4:14:58 PM By: [name] [name] Entered By: [name] [name] on 06/24/2023 11:06:50 -------------------------------------------------------------------------------- Multi Wound Chart Details Patient Name: Date of Service: [name] Poot EL J. 06/24/2023 10:30 A M Medical Record Number: [id number] Patient  Account Number: [id number] Date of Birth/Sex: Treating RN: 12/30/1950 (72 y.o. [name] [name] Primary Care Evelynn Hench: [name] [name] Other Clinician: Referring Avielle Imbert: Treating Jayel Scaduto/Extender: [name] [name] in Treatment: 3 Vital Signs Height(in): 71 Pulse(bpm): 93 Weight(lbs): 240 Blood Pressure(mmHg): 141/79 Body Mass Index(BMI): 33.5 Temperature(F): 97.7 Respiratory Rate(breaths/min): 18 [1:Photos:] [N/A:N/A] Right Lower Leg N/A N/A Wound Location: Gradually Appeared N/A N/A Wounding Event: Lymphedema N/A N/A Primary Etiology: Lymphedema, Asthma, Osteoarthritis N/A N/A Comorbid History: 03/24/2023 N/A N/A Date Acquired: 3 N/A N/A  Weeks of Treatment: Open N/A N/A Wound Status: No N/A N/A Wound Recurrence: Yes N/A N/A Clustered Wound: 3 N/A N/A Clustered Quantity: 0x0x0 N/A N/A Measurements L x W x D (cm) 0 N/A N/A A (cm) : rea 0 N/A N/A Volume (cm) : 100.00% N/A N/A % Reduction in A rea: 100.00% N/A N/A % Reduction in Volume: Partial Thickness N/A N/A Classification: None Present N/A N/A Exudate A mount: None Present (0%) N/A N/A Granulation A mount: None Present (0%) N/A N/A Necrotic A mount: Fat Layer (Subcutaneous Tissue): No N/A N/A Exposed Structures: Large (67-100%) N/A N/A Epithelialization: Treatment Notes Electronic Signature(s) Signed: 06/24/2023 12:26:19 PM By: [name] [name] Entered By: [name] [name] on 06/24/2023 12:26:19 -------------------------------------------------------------------------------- Multi-Disciplinary Care Plan Details Patient Name: Date of Service: [name] Poot EL J. 06/24/2023 10:30 A M Medical Record Number: [id number] Patient Account Number: [id number] Date of Birth/Sex: Treating RN: 1951-04-05 (72 y.o. [name] [name] Primary Care Sion Reinders: [name] [name] Other Clinician: Referring Abriel Hattery: Treating Shell Blanchette/Extender: [name] [name] in Treatment: 3 Active  Inactive Electronic Signature(s) Signed: 06/24/2023 12:27:32 PM By: [name] [name] Entered By: [name] [name] on 06/24/2023 12:27:32 [name] [name], [name] [name] ([id number]) [id number] Page 6 of 8 -------------------------------------------------------------------------------- Pain Assessment Details Patient Name: Date of Service: [name] [name], [name] [name] 06/24/2023 10:30 A M Medical Record Number: [id number] Patient Account Number: [id number] Date of Birth/Sex: Treating RN: 12-Nov-1950 (72 y.o. [name] [name] Primary Care Heavyn Yearsley: [name] [name] Other Clinician: Referring Dashanna Kinnamon: Treating Cacie Gaskins/Extender: [name] [name] in Treatment: 3 Active Problems Location of Pain Severity and Description of Pain Patient Has Paino No Site Locations Pain Management and Medication Current Pain Management: Electronic Signature(s) Signed: 06/24/2023 4:14:58 PM By: [name] [name] Entered By: [name] [name] on 06/24/2023 11:01:53 -------------------------------------------------------------------------------- Patient/Caregiver Education Details Patient Name: Date of Service: [name] [name] 12/9/2024andnbsp10:30 A M Medical Record Number: [id number] Patient Account Number: [id number] Date of Birth/Gender: Treating RN: 07/08/1951 (72 y.o. [name] [name] Primary Care Physician: [name] [name] Other Clinician: Referring Physician: Treating Physician/Extender: [name] [name] in Treatment: [address], [name] [name] ([id number]) [id number] Page 7 of 8 Education Assessment Education Provided To: Patient Education Topics Provided Wound/Skin Impairment: Handouts: Caring for Your Ulcer Methods: Explain/Verbal Responses: State content correctly Electronic Signature(s) Signed: 06/24/2023 4:14:58 PM By: [name] [name] Entered By: [name] [name] on 06/24/2023  12:27:43 -------------------------------------------------------------------------------- Wound Assessment Details Patient Name: Date of Service: [name] Lund J. 06/24/2023 10:30 A M Medical Record Number: [id number] Patient Account Number: [id number] Date of Birth/Sex: Treating RN: [birth year] (72 y.o. [name] [name] Primary Care Raseel Jans: [name] [name] Other Clinician: Referring Melitta Tigue: Treating Mazzy Santarelli/Extender: [name] [name] in Treatment: 3 Wound Status Wound Number: 1 Primary Etiology: Lymphedema Wound Location: Right Lower Leg Wound Status: Open Wounding Event: Gradually Appeared Comorbid History: Lymphedema, Asthma, Osteoarthritis Date Acquired: 03/24/2023 Weeks Of Treatment: 3 Clustered Wound: Yes Photos Wound Measurements Length: (cm) Width: (cm) Depth: (cm) Clustered Quantity: Area: (cm) Volume: (cm) 0 % Reduction in Area: 100% 0 % Reduction in Volume: 100% 0 Epithelialization: Large (67-100%) 3 Tunneling: No 0 Undermining: No 0 Wound Description Classification: Partial Thickness Exudate Amount: None Present Druck, [name] J ([id number]) Foul Odor After Cleansing: No Slough/Fibrino No [id number].pdf Page 8 of 8 Wound Bed Granulation Amount: None Present (0%) Exposed Structure Necrotic Amount: None Present (0%) Fat Layer (Subcutaneous Tissue) Exposed: No Electronic Signature(s) Signed: 06/24/2023 4:14:58 PM By: [name] [name] Entered By: [name] [name] on 06/24/2023 11:05:57 -------------------------------------------------------------------------------- Vitals Details Patient Name: Date of Service: [name] Poot  EL J. 06/24/2023 10:30 A M Medical Record Number: [id number] Patient Account Number: [id number] Date of Birth/Sex: Treating RN: 01-14-1951 (72 y.o. [name] [name] Primary Care Malajah Oceguera: [name] [name] Other Clinician: Referring Kane Kusek: Treating Ladajah Soltys/Extender: [name] [name] in Treatment: 3 Vital Signs Time Taken: 11:00 Temperature (F): 97.7 Height (in): 71 Pulse (bpm): 93 Weight (lbs): 240 Respiratory Rate (breaths/min): 18 Body Mass Index (BMI): 33.5 Blood Pressure (mmHg): 141/79 Reference Range: 80 - 120 mg / dl Electronic Signature(s) Signed: 06/24/2023 11:11:30 AM By: [name] [name] Entered By: [name] [name] on 06/24/2023 11:11:30

## 2023-06-26 DIAGNOSIS — L03115 Cellulitis of right lower limb: Secondary | ICD-10-CM | POA: Diagnosis not present

## 2023-06-26 DIAGNOSIS — L03116 Cellulitis of left lower limb: Secondary | ICD-10-CM | POA: Diagnosis not present

## 2023-06-26 DIAGNOSIS — G20C Parkinsonism, unspecified: Secondary | ICD-10-CM | POA: Diagnosis not present

## 2023-06-26 DIAGNOSIS — I890 Lymphedema, not elsewhere classified: Secondary | ICD-10-CM | POA: Diagnosis not present

## 2023-06-26 DIAGNOSIS — L97812 Non-pressure chronic ulcer of other part of right lower leg with fat layer exposed: Secondary | ICD-10-CM | POA: Diagnosis not present

## 2023-06-26 DIAGNOSIS — I87331 Chronic venous hypertension (idiopathic) with ulcer and inflammation of right lower extremity: Secondary | ICD-10-CM | POA: Diagnosis not present

## 2023-07-16 NOTE — Progress Notes (Signed)
 Assessment/Plan:   1.  Parkinsons Disease  -stop pramipexole  1.5 mg ER  -start pramipexole  0.75 mg tid.  -Continue carbidopa /levodopa  25/100, 2 tablets at 6 AM/ 2 tablets at 10 AM/1 tablet at 3 PM  -he looks worse now that he has had knee surgery and has had months of lack of activity.  Discussed starting PT and he has referral from ortho.  Discussed SLOWLY getting back into appropriate activity for exercise  - He has seen derm.  He knows that Parkinsons Disease increases risk for melanoma.   2.  L shoulder pain  -considering surgical intervention with Dr. Melita   Subjective:   [name] [name] was seen today in follow up for Parkinsons disease.  My previous records were reviewed prior to todays visit as well as outside records available to me.  He stepped down the step late August, and tore cartilage in the knee.  He had a total knee done on September 6, but developed cellulitis after.  He had multiple rounds of antibiotics for this.  He eventually went to wound care and the cellulitis did resolve.  He does have residual lymphedema.  Patient did have a fall in early December.  He was not hurt.  It happened putting shelving into the garage and he fell over a box.  He is still hoping to have shoulder surgery.  He's noted a little freezing when backing up from the refrigerator, which is new for him.    Current prescribed movement disorder medications: Pramipexole  ER, 1.5 mg daily Carbidopa /levodopa  25/100, 2/2/1    PREVIOUS MEDICATIONS: Sinemet  and Mirapex   ALLERGIES:   Allergies  Allergen Reactions   Penicillins Rash and Other (See Comments)    Did it involve swelling of the face/tongue/throat, SOB, or low BP? No Did it involve sudden or severe rash/hives, skin peeling, or any reaction on the inside of your mouth or nose? No Did you need to seek medical attention at a hospital or doctor's office? No When did it last happen?      childhood allergy If all above answers are  "NO", may proceed with cephalosporin use.      CURRENT MEDICATIONS:  Outpatient Encounter Medications as of 07/19/2023  Medication Sig   albuterol  (VENTOLIN  HFA) 108 (90 Base) MCG/ACT inhaler Inhale 2 puffs into the lungs every 4 (four) hours as needed for wheezing or shortness of breath.   atorvastatin  (LIPITOR) 20 MG tablet Take 1 tablet (20 mg total) by mouth daily.   carbidopa -levodopa  (SINEMET  IR) 25-100 MG tablet 2 at 6 AM/ 2 at 10 AM/1 at 3 PM   doxycycline  (VIBRA -TABS) 100 MG tablet Take 1 tablet (100 mg total) by mouth 2 (two) times daily.   ELIQUIS  2.5 MG TABS tablet Take 1 tablet (2.5 mg total) by mouth 2 (two) times daily.   esomeprazole  (NEXIUM ) 20 MG capsule Take 1 capsule (20 mg total) by mouth 2 (two) times daily before a meal.   finasteride  (PROSCAR ) 5 MG tablet Take 1 tablet (5 mg total) by mouth daily.   gabapentin  (NEURONTIN ) 100 MG capsule Take 1 capsule (100 mg total) by mouth 3 (three) times daily.   hydrochlorothiazide  (MICROZIDE ) 12.5 MG capsule Take 1 capsule (12.5 mg total) by mouth daily.   polyethylene glycol (MIRALAX  / GLYCOLAX ) packet Take 8.5 g by mouth at bedtime.   potassium chloride  (KLOR-CON ) 10 MEQ tablet TAKE 1 TABLET(10 MEQ) BY MOUTH DAILY   Pramipexole  Dihydrochloride (MIRAPEX  ER) 1.5 MG TB24 Take 1 tablet (1.5  mg total) by mouth daily.   silver  sulfADIAZINE  (SILVADENE ) 1 % cream Apply 1 Application topically daily.   tamsulosin  (FLOMAX ) 0.4 MG CAPS capsule TAKE 1 CAPSULE(0.4 MG) BY MOUTH DAILY   fluticasone -salmeterol (ADVAIR  HFA) 230-21 MCG/ACT inhaler Inhale 2 puffs into the lungs 2 (two) times daily. (Patient not taking: Reported on 07/19/2023)   [DISCONTINUED] clindamycin  (CLEOCIN ) 300 MG capsule Take 1 capsule (300 mg total) by mouth every 6 (six) hours.   [DISCONTINUED] phentermine  37.5 MG capsule Take 1 capsule (37.5 mg total) by mouth every morning.   [DISCONTINUED] tiZANidine  (ZANAFLEX ) 2 MG tablet Take 2 mg by mouth.   No facility-administered  encounter medications on file as of 07/19/2023.    Objective:   PHYSICAL EXAMINATION:    VITALS:   Vitals:   07/19/23 1019  BP: 120/74  Pulse: 94  SpO2: 93%  Weight: 259 lb (117.5 kg)  Height: 5' 11 (1.803 m)     GEN:  The patient appears stated age and is in NAD. HEENT:  Normocephalic, atraumatic. CV:  RRR Lungs:  CTAB Neck  no bruits Exts:  there is LE edema b/l   Neurological examination:  Orientation: The patient is alert and oriented x3. Cranial nerves: There is good facial symmetry with min facial hypomimia (lips parted). The speech is fluent and clear.  Hearing is intact to conversational tone. Sensation: Sensation is intact to light touch throughout Motor: Strength is at least antigravity x 4  Movement examination: Tone: There is mild to mod increased tone in the RUE and mild in the LUE Abnormal movements: there is no tremor today Coordination:  There is no decremation, with any form of RAMS, including alternating supination and pronation of the forearm, hand opening and closing, finger taps Gait and Station: The patient is forward flexed.  No shuffling.  Stride length is good but he is festinating a bit.  I have reviewed and interpreted the following labs independently    Chemistry      Component Value Date/Time   NA 139 08/09/2022 0957   K 4.0 08/09/2022 0957   CL 100 08/09/2022 0957   CO2 29 08/09/2022 0957   BUN 15 08/09/2022 0957   CREATININE 0.75 08/09/2022 0957   CREATININE 0.65 (L) 02/24/2020 1208      Component Value Date/Time   CALCIUM  9.3 08/09/2022 0957   ALKPHOS 89 08/09/2022 0957   AST 17 08/09/2022 0957   ALT 8 08/09/2022 0957   BILITOT 2.4 (H) 08/09/2022 0957       Lab Results  Component Value Date   WBC 5.3 08/09/2022   HGB 15.0 08/09/2022   HCT 42.0 08/09/2022   MCV 89.1 08/09/2022   PLT 203.0 08/09/2022    Lab Results  Component Value Date   TSH 2.04 08/09/2022   Total time spent on today's visit was 31 minutes,  including both face-to-face time and nonface-to-face time.  Time included that spent on review of records (prior notes available to me/labs/imaging if pertinent), discussing treatment and goals, answering patient's questions and coordinating care.   Cc:  Johnny Garnette LABOR, MD

## 2023-07-19 ENCOUNTER — Ambulatory Visit (INDEPENDENT_AMBULATORY_CARE_PROVIDER_SITE_OTHER): Payer: Medicare Other | Admitting: Neurology

## 2023-07-19 ENCOUNTER — Encounter: Payer: Self-pay | Admitting: Neurology

## 2023-07-19 VITALS — BP 120/74 | HR 94 | Ht 71.0 in | Wt 259.0 lb

## 2023-07-19 DIAGNOSIS — G20A1 Parkinson's disease without dyskinesia, without mention of fluctuations: Secondary | ICD-10-CM

## 2023-07-19 MED ORDER — PRAMIPEXOLE DIHYDROCHLORIDE 0.75 MG PO TABS: 0.7500 mg | ORAL_TABLET | Freq: Three times a day (TID) | ORAL | 1 refills | Status: DC

## 2023-07-19 MED ORDER — CARBIDOPA-LEVODOPA 25-100 MG PO TABS: ORAL_TABLET | ORAL | 1 refills | Status: DC

## 2023-07-19 NOTE — Patient Instructions (Signed)
 STOP your pramipexole ER 1.5 mg daily START pramipexole 0.75 mg three times per day  Continue carbidopa/levodopa 25/100, 2 at 6am and 2 at 10 am and 1 at 3pm

## 2023-08-08 ENCOUNTER — Encounter: Payer: Medicare Other | Attending: Physician Assistant | Admitting: Physician Assistant

## 2023-08-08 DIAGNOSIS — L97812 Non-pressure chronic ulcer of other part of right lower leg with fat layer exposed: Secondary | ICD-10-CM | POA: Diagnosis not present

## 2023-08-08 DIAGNOSIS — I890 Lymphedema, not elsewhere classified: Secondary | ICD-10-CM | POA: Insufficient documentation

## 2023-08-08 DIAGNOSIS — G20C Parkinsonism, unspecified: Secondary | ICD-10-CM | POA: Insufficient documentation

## 2023-08-08 DIAGNOSIS — I87331 Chronic venous hypertension (idiopathic) with ulcer and inflammation of right lower extremity: Secondary | ICD-10-CM | POA: Insufficient documentation

## 2023-08-08 DIAGNOSIS — Z96653 Presence of artificial knee joint, bilateral: Secondary | ICD-10-CM | POA: Diagnosis not present

## 2023-08-08 DIAGNOSIS — L03115 Cellulitis of right lower limb: Secondary | ICD-10-CM | POA: Insufficient documentation

## 2023-08-12 ENCOUNTER — Telehealth: Payer: Self-pay | Admitting: Family Medicine

## 2023-08-12 NOTE — Telephone Encounter (Unsigned)
Copied from CRM [id number]. Topic: Clinical - Medication Refill >> Aug 12, 2023  3:55 PM Leavy Cella D wrote: Most Recent Primary Care Visit:  Provider: Gershon Crane A  Department: [facility]  Visit Type: OFFICE VISIT  Date: 05/07/2023  Medication: pherming 35 mg  Has the patient contacted their pharmacy? No (Agent: If no, request that the patient contact the pharmacy for the refill. If patient does not wish to contact the pharmacy document the reason why and proceed with request.) (Agent: If yes, when and what did the pharmacy advise?)  Is this the correct pharmacy for this prescription? Yes If no, delete pharmacy and type the correct one.  This is the patient's preferred pharmacy:  [facility] - [place], [place] - [address] [place] [address] Phone: [phone] Fax: [phone]     Has the prescription been filled recently? No  Is the patient out of the medication? No  Has the patient been seen for an appointment in the last year OR does the patient have an upcoming appointment? Yes  Can we respond through MyChart? No  Agent: Please be advised that Rx refills may take up to 3 business days. We ask that you follow-up with your pharmacy.

## 2023-08-15 NOTE — Telephone Encounter (Signed)
Rx not on pt med list. Please advise

## 2023-08-16 MED ORDER — PHENTERMINE HCL 37.5 MG PO CAPS: 37.5000 mg | ORAL_CAPSULE | ORAL | 1 refills | Status: DC

## 2023-08-16 NOTE — Telephone Encounter (Signed)
Spoke with pt verbalized understanding 

## 2023-08-16 NOTE — Telephone Encounter (Signed)
This is for Phentermine. I sent it in.

## 2023-09-11 ENCOUNTER — Other Ambulatory Visit: Payer: Self-pay | Admitting: Family Medicine

## 2023-09-11 ENCOUNTER — Other Ambulatory Visit: Payer: Self-pay | Admitting: Orthopedic Surgery

## 2023-09-11 DIAGNOSIS — G8929 Other chronic pain: Secondary | ICD-10-CM

## 2023-09-11 DIAGNOSIS — M19012 Primary osteoarthritis, left shoulder: Secondary | ICD-10-CM | POA: Diagnosis not present

## 2023-09-11 DIAGNOSIS — Z96611 Presence of right artificial shoulder joint: Secondary | ICD-10-CM | POA: Diagnosis not present

## 2023-09-24 ENCOUNTER — Other Ambulatory Visit: Payer: Self-pay | Admitting: Family Medicine

## 2023-09-24 DIAGNOSIS — N138 Other obstructive and reflux uropathy: Secondary | ICD-10-CM

## 2023-09-24 NOTE — Telephone Encounter (Signed)
 Copied from CRM [id number]. Topic: Clinical - Medication Refill >> Sep 24, 2023  3:34 PM Almira Coaster wrote: Most Recent Primary Care Visit:  Provider: Gershon Crane A  Department: [facility]  Visit Type: OFFICE VISIT  Date: 05/07/2023  Medication: finasteride (PROSCAR) 5 MG tablet  Has the patient contacted their pharmacy? No (Agent: If no, request that the patient contact the pharmacy for the refill. If patient does not wish to contact the pharmacy document the reason why and proceed with request.) (Agent: If yes, when and what did the pharmacy advise?)  Is this the correct pharmacy for this prescription? Yes If no, delete pharmacy and type the correct one.  This is the patient's preferred pharmacy:  [facility] - [place], [place] - [address] [place] [address] Phone: [phone] Fax: [phone]    Has the prescription been filled recently? No  Is the patient out of the medication? No  Has the patient been seen for an appointment in the last year OR does the patient have an upcoming appointment? Yes, last appointment on 05/07/2023  Can we respond through MyChart? Yes  Agent: Please be advised that Rx refills may take up to 3 business days. We ask that you follow-up with your pharmacy.

## 2023-09-25 ENCOUNTER — Other Ambulatory Visit: Payer: Self-pay | Admitting: Family Medicine

## 2023-09-25 MED ORDER — FINASTERIDE 5 MG PO TABS: 5.0000 mg | ORAL_TABLET | Freq: Every day | ORAL | 0 refills | Status: DC

## 2023-09-27 ENCOUNTER — Telehealth: Payer: Self-pay | Admitting: Neurology

## 2023-09-27 NOTE — Telephone Encounter (Signed)
 Pt called in to see if our office had received his surgery clearance forms so he can schedule his surgery. I did not see a note that we had received any. I had him have the office fax them again in case we had not received it.

## 2023-09-30 NOTE — Telephone Encounter (Signed)
 I have his clearance form but I changed his meds last visit because he didn't look as good.  Can he come in next Monday for me to look at him and can do the form then, if appropriate?

## 2023-10-03 ENCOUNTER — Ambulatory Visit
Admission: RE | Admit: 2023-10-03 | Discharge: 2023-10-03 | Disposition: A | Discharge status: Home / Self Care | Payer: Medicare Other | Source: Ambulatory Visit | Attending: Orthopedic Surgery | Admitting: Orthopedic Surgery

## 2023-10-03 DIAGNOSIS — M19012 Primary osteoarthritis, left shoulder: Secondary | ICD-10-CM | POA: Diagnosis not present

## 2023-10-03 DIAGNOSIS — M25512 Pain in left shoulder: Secondary | ICD-10-CM | POA: Diagnosis not present

## 2023-10-03 DIAGNOSIS — G8929 Other chronic pain: Secondary | ICD-10-CM

## 2023-10-03 NOTE — Progress Notes (Signed)
 Assessment/Plan:   1.  Parkinsons Disease  - increase pramipexole 1 mg tid  -Continue carbidopa/levodopa 25/100, 2 tablets at 6 AM/ 2 tablets at 10 AM/1 tablet at 3 PM  - He has seen derm.  He knows that Parkinsons Disease increases risk for melanoma.   2.  L shoulder pain  -Having reverse shoulder replacement soon.  -Paperwork filled out for surgery.  Take Parkinsons Disease meds day of sx.     Subjective:   [name] [name] was seen today in follow up for Parkinsons disease.  My previous records were reviewed prior to todays visit as well as outside records available to me.  Pt with wife who supplements hx.  We received a clearance form for his upcoming reverse shoulder replacement.  I saw him last visit, his Parkinson's really was not optimized and I changed and increased his pramipexole.  He returns today so that we can see how he is doing and fill out his paperwork.  He is having no hallucination.  No compulsive behaviors.    Current prescribed movement disorder medications: Pramipexole 0.75 mg 3 times per day Carbidopa/levodopa 25/100, 2/2/1    PREVIOUS MEDICATIONS: Sinemet and Mirapex  ALLERGIES:   Allergies  Allergen Reactions   Penicillins Rash and Other (See Comments)    Did it involve swelling of the face/tongue/throat, SOB, or low BP? No Did it involve sudden or severe rash/hives, skin peeling, or any reaction on the inside of your mouth or nose? No Did you need to seek medical attention at a hospital or doctor's office? No When did it last happen?      childhood allergy If all above answers are "NO", may proceed with cephalosporin use.      CURRENT MEDICATIONS:  Outpatient Encounter Medications as of 10/07/2023  Medication Sig   albuterol (VENTOLIN HFA) 108 (90 Base) MCG/ACT inhaler Inhale 2 puffs into the lungs every 4 (four) hours as needed for wheezing or shortness of breath.   atorvastatin (LIPITOR) 20 MG tablet Take 1 tablet (20 mg total) by mouth  daily.   carbidopa-levodopa (SINEMET IR) 25-100 MG tablet 2 at 6 AM/ 2 at 10 AM/1 at 3 PM   doxycycline (VIBRA-TABS) 100 MG tablet TAKE 1 TABLET(100 MG) BY MOUTH TWICE DAILY   ELIQUIS 2.5 MG TABS tablet Take 1 tablet (2.5 mg total) by mouth 2 (two) times daily.   esomeprazole (NEXIUM) 20 MG capsule Take 1 capsule (20 mg total) by mouth 2 (two) times daily before a meal.   finasteride (PROSCAR) 5 MG tablet Take 1 tablet (5 mg total) by mouth daily.   fluticasone-salmeterol (ADVAIR HFA) 230-21 MCG/ACT inhaler Inhale 2 puffs into the lungs 2 (two) times daily.   gabapentin (NEURONTIN) 100 MG capsule Take 1 capsule (100 mg total) by mouth 3 (three) times daily.   hydrochlorothiazide (MICROZIDE) 12.5 MG capsule TAKE 1 CAPSULE DAILY   phentermine 37.5 MG capsule Take 1 capsule (37.5 mg total) by mouth every morning.   polyethylene glycol (MIRALAX / GLYCOLAX) packet Take 8.5 g by mouth at bedtime.   potassium chloride (KLOR-CON) 10 MEQ tablet TAKE 1 TABLET(10 MEQ) BY MOUTH DAILY   pramipexole (MIRAPEX) 1 MG tablet Take 1 tablet (1 mg total) by mouth 3 (three) times daily.   silver sulfADIAZINE (SILVADENE) 1 % cream Apply 1 Application topically daily.   tamsulosin (FLOMAX) 0.4 MG CAPS capsule TAKE 1 CAPSULE(0.4 MG) BY MOUTH DAILY   [DISCONTINUED] pramipexole (MIRAPEX) 0.75 MG tablet Take 1 tablet (  0.75 mg total) by mouth 3 (three) times daily.   No facility-administered encounter medications on file as of 10/07/2023.    Objective:   PHYSICAL EXAMINATION:    VITALS:   Vitals:   10/07/23 1356  BP: 132/82  Pulse: 60  SpO2: 98%  Weight: 252 lb 12.8 oz (114.7 kg)  Height: 6' (1.829 m)   GEN:  The patient appears stated age and is in NAD. HEENT:  Normocephalic, atraumatic. CV:  RRR Lungs:  CTAB Neck  no bruits Exts:  there is LE edema b/l  Neurological examination:  Orientation: The patient is alert and oriented x3. Cranial nerves: There is good facial symmetry with min facial hypomimia  (lips parted). The speech is fluent and clear.  Hearing is intact to conversational tone. Sensation: Sensation is intact to light touch throughout Motor: Strength is at least antigravity x 4  Movement examination: Tone: There is mild increased tone in the bilateral UE Abnormal movements: there is no tremor today Coordination:  There is no decremation, with any form of RAMS, including alternating supination and pronation of the forearm, hand opening and closing, finger taps Gait and Station: The patient is less forward flexed but he is antalgic.  I have reviewed and interpreted the following labs independently    Chemistry      Component Value Date/Time   NA 139 08/09/2022 0957   K 4.0 08/09/2022 0957   CL 100 08/09/2022 0957   CO2 29 08/09/2022 0957   BUN 15 08/09/2022 0957   CREATININE 0.75 08/09/2022 0957   CREATININE 0.65 (L) 02/24/2020 1208      Component Value Date/Time   CALCIUM 9.3 08/09/2022 0957   ALKPHOS 89 08/09/2022 0957   AST 17 08/09/2022 0957   ALT 8 08/09/2022 0957   BILITOT 2.4 (H) 08/09/2022 0957       Lab Results  Component Value Date   WBC 5.3 08/09/2022   HGB 15.0 08/09/2022   HCT 42.0 08/09/2022   MCV 89.1 08/09/2022   PLT 203.0 08/09/2022    Lab Results  Component Value Date   TSH 2.04 08/09/2022   Total time spent on today's visit was 30 minutes, including both face-to-face time and nonface-to-face time.  Time included that spent on review of records (prior notes available to me/labs/imaging if pertinent), discussing treatment and goals, answering patient's questions and coordinating care.   Cc:  Nelwyn Salisbury, MD

## 2023-10-07 ENCOUNTER — Ambulatory Visit (INDEPENDENT_AMBULATORY_CARE_PROVIDER_SITE_OTHER): Admitting: Neurology

## 2023-10-07 ENCOUNTER — Encounter: Payer: Self-pay | Admitting: Neurology

## 2023-10-07 VITALS — BP 132/82 | HR 60 | Ht 72.0 in | Wt 252.8 lb

## 2023-10-07 DIAGNOSIS — M25512 Pain in left shoulder: Secondary | ICD-10-CM

## 2023-10-07 DIAGNOSIS — G20A1 Parkinson's disease without dyskinesia, without mention of fluctuations: Secondary | ICD-10-CM | POA: Diagnosis not present

## 2023-10-07 DIAGNOSIS — G8929 Other chronic pain: Secondary | ICD-10-CM | POA: Diagnosis not present

## 2023-10-07 MED ORDER — PRAMIPEXOLE DIHYDROCHLORIDE 1 MG PO TABS: 1.0000 mg | ORAL_TABLET | Freq: Three times a day (TID) | ORAL | 1 refills | Status: DC

## 2023-10-07 NOTE — Patient Instructions (Signed)
 Week 1 Take pramipexole 0.75 mg at 6am/10am and 1 mg at 3pm  Week 2 Take pramipexole 0.75 mg at 6am and 1 mg at 10am and 3pm  Week 3 Take pramipexole  1 mg at 6 am/10am/3pm  Continue carbidopa/levodopa, 2 tablets at 6am/2 at 10am, 1 at 3pm

## 2023-10-15 HISTORY — PX: TOTAL SHOULDER ARTHROPLASTY: SHX126

## 2023-10-16 DIAGNOSIS — R21 Rash and other nonspecific skin eruption: Secondary | ICD-10-CM | POA: Diagnosis not present

## 2023-10-18 ENCOUNTER — Telehealth: Payer: Self-pay | Admitting: Family Medicine

## 2023-10-18 ENCOUNTER — Other Ambulatory Visit: Payer: Self-pay | Admitting: Family Medicine

## 2023-10-18 DIAGNOSIS — N138 Other obstructive and reflux uropathy: Secondary | ICD-10-CM

## 2023-10-18 MED ORDER — TAMSULOSIN HCL 0.4 MG PO CAPS: ORAL_CAPSULE | ORAL | 3 refills | Status: DC

## 2023-10-18 NOTE — Telephone Encounter (Signed)
 Copied from CRM [id number]. Topic: Clinical - Medication Refill >> Oct 18, 2023 11:16 AM Marica Otter wrote: Most Recent Primary Care Visit:  Provider: Gershon Crane A  Department: [facility]  Visit Type: OFFICE VISIT  Date: 05/07/2023  Medication: tamsulosin (FLOMAX) 0.4 MG CAPS capsule  Has the patient contacted their pharmacy? Yes, was told to contact provider (Agent: If no, request that the patient contact the pharmacy for the refill. If patient does not wish to contact the pharmacy document the reason why and proceed with request.) (Agent: If yes, when and what did the pharmacy advise?)  Is this the correct pharmacy for this prescription? Yes If no, delete pharmacy and type the correct one.  This is the patient's preferred pharmacy:  [facility] - [place], [place] - [address] [place] [address] Phone: [phone] Fax: [phone]  [facility] [phone] - [place], [place] - [address] AT [facility] [address] [facility] [address] [place] [address] Phone: [phone] Fax: [phone]   Has the prescription been filled recently? No  Is the patient out of the medication? No  Has the patient been seen for an appointment in the last year OR does the patient have an upcoming appointment? No  Can we respond through MyChart? Yes  Agent: Please be advised that Rx refills may take up to 3 business days. We ask that you follow-up with your pharmacy.

## 2023-10-18 NOTE — Telephone Encounter (Signed)
 Copied from CRM [id number]. Topic: General - Other >> Oct 18, 2023 11:18 AM Gurney Maxin H wrote: Reason for CRM: Patients wife Gavin Pound just wanted to inform Dr. Clent Ridges that Kore is having shoulder replacement surgery on next Tuesday 4/8

## 2023-10-22 DIAGNOSIS — M19012 Primary osteoarthritis, left shoulder: Secondary | ICD-10-CM | POA: Diagnosis not present

## 2023-10-22 DIAGNOSIS — M25712 Osteophyte, left shoulder: Secondary | ICD-10-CM | POA: Diagnosis not present

## 2023-10-22 DIAGNOSIS — G8918 Other acute postprocedural pain: Secondary | ICD-10-CM | POA: Diagnosis not present

## 2023-11-05 ENCOUNTER — Other Ambulatory Visit: Payer: Self-pay | Admitting: Family Medicine

## 2023-11-05 NOTE — Telephone Encounter (Signed)
 Copied from CRM [id number]. Topic: Clinical - Medication Refill >> Nov 05, 2023 10:41 AM Yolande Hench C wrote: Most Recent Primary Care Visit:  Provider: Corita Diego A  Department: [facility]  Visit Type: OFFICE VISIT  Date: 05/07/2023  Medication: doxycycline  (VIBRA -TABS) 100 MG tablet  Has the patient contacted their pharmacy? No, patient contacted providers office to initiate RX refill (Agent: If no, request that the patient contact the pharmacy for the refill. If patient does not wish to contact the pharmacy document the reason why and proceed with request.) (Agent: If yes, when and what did the pharmacy advise?)  Is this the correct pharmacy for this prescription? Yes If no, delete pharmacy and type the correct one.  This is the patient's preferred pharmacy:  [facility] [phone] - [place], [place] - [phone] AT [facility] [address] [facility] [address] [place] [address] Phone: [phone] Fax: [phone]   Has the prescription been filled recently? Yes  Is the patient out of the medication? No  Has the patient been seen for an appointment in the last year OR does the patient have an upcoming appointment? Yes  Can we respond through MyChart? Yes  Agent: Please be advised that Rx refills may take up to 3 business days. We ask that you follow-up with your pharmacy.

## 2023-11-06 DIAGNOSIS — Z5189 Encounter for other specified aftercare: Secondary | ICD-10-CM | POA: Diagnosis not present

## 2023-11-06 DIAGNOSIS — Z96612 Presence of left artificial shoulder joint: Secondary | ICD-10-CM | POA: Diagnosis not present

## 2023-11-07 ENCOUNTER — Ambulatory Visit: Payer: Medicare Other | Admitting: Neurology

## 2023-11-07 MED ORDER — DOXYCYCLINE HYCLATE 100 MG PO TABS: 100.0000 mg | ORAL_TABLET | Freq: Two times a day (BID) | ORAL | 5 refills | Status: DC

## 2023-11-18 DIAGNOSIS — I87391 Chronic venous hypertension (idiopathic) with other complications of right lower extremity: Secondary | ICD-10-CM | POA: Diagnosis not present

## 2023-11-18 DIAGNOSIS — L578 Other skin changes due to chronic exposure to nonionizing radiation: Secondary | ICD-10-CM | POA: Diagnosis not present

## 2023-11-18 DIAGNOSIS — L570 Actinic keratosis: Secondary | ICD-10-CM | POA: Diagnosis not present

## 2023-11-18 DIAGNOSIS — L821 Other seborrheic keratosis: Secondary | ICD-10-CM | POA: Diagnosis not present

## 2023-11-18 DIAGNOSIS — I872 Venous insufficiency (chronic) (peripheral): Secondary | ICD-10-CM | POA: Diagnosis not present

## 2023-11-18 DIAGNOSIS — L905 Scar conditions and fibrosis of skin: Secondary | ICD-10-CM | POA: Diagnosis not present

## 2023-11-18 DIAGNOSIS — L814 Other melanin hyperpigmentation: Secondary | ICD-10-CM | POA: Diagnosis not present

## 2023-11-18 DIAGNOSIS — D1801 Hemangioma of skin and subcutaneous tissue: Secondary | ICD-10-CM | POA: Diagnosis not present

## 2023-11-18 DIAGNOSIS — Z85828 Personal history of other malignant neoplasm of skin: Secondary | ICD-10-CM | POA: Diagnosis not present

## 2023-11-21 DIAGNOSIS — R29898 Other symptoms and signs involving the musculoskeletal system: Secondary | ICD-10-CM | POA: Diagnosis not present

## 2023-11-21 DIAGNOSIS — M25612 Stiffness of left shoulder, not elsewhere classified: Secondary | ICD-10-CM | POA: Diagnosis not present

## 2023-11-21 DIAGNOSIS — M25512 Pain in left shoulder: Secondary | ICD-10-CM | POA: Diagnosis not present

## 2023-11-21 DIAGNOSIS — Z96612 Presence of left artificial shoulder joint: Secondary | ICD-10-CM | POA: Diagnosis not present

## 2023-11-27 DIAGNOSIS — M25512 Pain in left shoulder: Secondary | ICD-10-CM | POA: Diagnosis not present

## 2023-11-27 DIAGNOSIS — M25612 Stiffness of left shoulder, not elsewhere classified: Secondary | ICD-10-CM | POA: Diagnosis not present

## 2023-11-27 DIAGNOSIS — R29898 Other symptoms and signs involving the musculoskeletal system: Secondary | ICD-10-CM | POA: Diagnosis not present

## 2023-11-27 DIAGNOSIS — Z96612 Presence of left artificial shoulder joint: Secondary | ICD-10-CM | POA: Diagnosis not present

## 2023-11-29 ENCOUNTER — Other Ambulatory Visit: Payer: Self-pay

## 2023-11-29 ENCOUNTER — Telehealth: Payer: Self-pay | Admitting: Neurology

## 2023-11-29 DIAGNOSIS — G20A1 Parkinson's disease without dyskinesia, without mention of fluctuations: Secondary | ICD-10-CM

## 2023-11-29 MED ORDER — PRAMIPEXOLE DIHYDROCHLORIDE 1 MG PO TABS: 1.0000 mg | ORAL_TABLET | Freq: Three times a day (TID) | ORAL | 0 refills | Status: DC

## 2023-11-29 NOTE — Telephone Encounter (Signed)
 Pt. Calling for Refill MIRAPEX  90 day Rx

## 2023-11-29 NOTE — Telephone Encounter (Signed)
 RX sent patient has been called

## 2023-12-04 DIAGNOSIS — M25612 Stiffness of left shoulder, not elsewhere classified: Secondary | ICD-10-CM | POA: Diagnosis not present

## 2023-12-04 DIAGNOSIS — M25512 Pain in left shoulder: Secondary | ICD-10-CM | POA: Diagnosis not present

## 2023-12-04 DIAGNOSIS — Z96612 Presence of left artificial shoulder joint: Secondary | ICD-10-CM | POA: Diagnosis not present

## 2023-12-04 DIAGNOSIS — R29898 Other symptoms and signs involving the musculoskeletal system: Secondary | ICD-10-CM | POA: Diagnosis not present

## 2023-12-10 ENCOUNTER — Telehealth: Payer: Self-pay | Admitting: Family Medicine

## 2023-12-10 NOTE — Telephone Encounter (Unsigned)
 Copied from CRM [id number]. Topic: Clinical - Medication Refill >> Dec 10, 2023 10:42 AM Dimple Francis wrote: Medication: hydrochlorothiazide  (MICROZIDE ) 12.5 MG capsule ;   Has the patient contacted their pharmacy? Yes (Agent: If no, request that the patient contact the pharmacy for the refill. If patient does not wish to contact the pharmacy document the reason why and proceed with request.) (Agent: If yes, when and what did the pharmacy advise?)  This is the patient's preferred pharmacy:  [facility] - [place], [place] - [address] [place] [address] Phone: [phone] Fax: [phone]   Is this the correct pharmacy for this prescription? Yes If no, delete pharmacy and type the correct one.   Has the prescription been filled recently? Yes  Is the patient out of the medication? Yes  Has the patient been seen for an appointment in the last year OR does the patient have an upcoming appointment? Yes  Can we respond through MyChart? Yes  Agent: Please be advised that Rx refills may take up to 3 business days. We ask that you follow-up with your pharmacy.

## 2023-12-11 ENCOUNTER — Other Ambulatory Visit: Payer: Self-pay | Admitting: Family Medicine

## 2023-12-11 DIAGNOSIS — M25612 Stiffness of left shoulder, not elsewhere classified: Secondary | ICD-10-CM | POA: Diagnosis not present

## 2023-12-11 DIAGNOSIS — Z96612 Presence of left artificial shoulder joint: Secondary | ICD-10-CM | POA: Diagnosis not present

## 2023-12-11 DIAGNOSIS — M25512 Pain in left shoulder: Secondary | ICD-10-CM | POA: Diagnosis not present

## 2023-12-11 DIAGNOSIS — R29898 Other symptoms and signs involving the musculoskeletal system: Secondary | ICD-10-CM | POA: Diagnosis not present

## 2023-12-11 DIAGNOSIS — N138 Other obstructive and reflux uropathy: Secondary | ICD-10-CM

## 2023-12-11 MED ORDER — HYDROCHLOROTHIAZIDE 12.5 MG PO CAPS
12.5000 mg | ORAL_CAPSULE | Freq: Every day | ORAL | 0 refills | Status: DC
Start: 2023-12-11 — End: 2024-03-17

## 2023-12-17 ENCOUNTER — Other Ambulatory Visit: Payer: Self-pay | Admitting: Family Medicine

## 2023-12-18 DIAGNOSIS — Z96612 Presence of left artificial shoulder joint: Secondary | ICD-10-CM | POA: Diagnosis not present

## 2023-12-18 DIAGNOSIS — R29898 Other symptoms and signs involving the musculoskeletal system: Secondary | ICD-10-CM | POA: Diagnosis not present

## 2023-12-18 DIAGNOSIS — M25612 Stiffness of left shoulder, not elsewhere classified: Secondary | ICD-10-CM | POA: Diagnosis not present

## 2023-12-18 DIAGNOSIS — M25512 Pain in left shoulder: Secondary | ICD-10-CM | POA: Diagnosis not present

## 2023-12-24 DIAGNOSIS — H2513 Age-related nuclear cataract, bilateral: Secondary | ICD-10-CM | POA: Diagnosis not present

## 2023-12-24 DIAGNOSIS — H5203 Hypermetropia, bilateral: Secondary | ICD-10-CM | POA: Diagnosis not present

## 2023-12-24 DIAGNOSIS — H524 Presbyopia: Secondary | ICD-10-CM | POA: Diagnosis not present

## 2023-12-24 DIAGNOSIS — D3132 Benign neoplasm of left choroid: Secondary | ICD-10-CM | POA: Diagnosis not present

## 2023-12-24 DIAGNOSIS — H52223 Regular astigmatism, bilateral: Secondary | ICD-10-CM | POA: Diagnosis not present

## 2023-12-25 DIAGNOSIS — M25612 Stiffness of left shoulder, not elsewhere classified: Secondary | ICD-10-CM | POA: Diagnosis not present

## 2023-12-25 DIAGNOSIS — R29898 Other symptoms and signs involving the musculoskeletal system: Secondary | ICD-10-CM | POA: Diagnosis not present

## 2023-12-25 DIAGNOSIS — M25512 Pain in left shoulder: Secondary | ICD-10-CM | POA: Diagnosis not present

## 2023-12-25 DIAGNOSIS — Z96612 Presence of left artificial shoulder joint: Secondary | ICD-10-CM | POA: Diagnosis not present

## 2024-01-01 ENCOUNTER — Telehealth: Payer: Self-pay | Admitting: Family Medicine

## 2024-01-01 DIAGNOSIS — M25612 Stiffness of left shoulder, not elsewhere classified: Secondary | ICD-10-CM | POA: Diagnosis not present

## 2024-01-01 DIAGNOSIS — M25512 Pain in left shoulder: Secondary | ICD-10-CM | POA: Diagnosis not present

## 2024-01-01 DIAGNOSIS — Z96612 Presence of left artificial shoulder joint: Secondary | ICD-10-CM | POA: Diagnosis not present

## 2024-01-01 DIAGNOSIS — R29898 Other symptoms and signs involving the musculoskeletal system: Secondary | ICD-10-CM | POA: Diagnosis not present

## 2024-01-01 DIAGNOSIS — I890 Lymphedema, not elsewhere classified: Secondary | ICD-10-CM

## 2024-01-01 NOTE — Telephone Encounter (Signed)
 I did a referral to Vascular Surgery

## 2024-01-01 NOTE — Telephone Encounter (Signed)
 Copied from CRM [id number]. Topic: Referral - Request for Referral >> Jan 01, 2024 12:00 PM Laquanda P wrote: Did the patient discuss referral with their provider in the last year? Yes (If No - schedule appointment) (If Yes - send message)  Appointment offered? No  Type of order/referral and detailed reason for visit: Referral for Lipedema specialist- pt leg is very swollen and heavy, the issues is not resolving and the straps not working.  Preference of office, provider, location: Pt prefer PCP recommendations  If referral order, have you been seen by this specialty before? No (If Yes, this issue or another issue? When? Where?  Can we respond through MyChart? Yes   ----------------------------------------------------------------------- From previous Reason for Contact - Referral Status: Reason for CRM:

## 2024-01-01 NOTE — Telephone Encounter (Signed)
 Pt notified of the referral, voiced understanding

## 2024-01-10 ENCOUNTER — Telehealth: Payer: Self-pay | Admitting: Neurology

## 2024-01-10 NOTE — Telephone Encounter (Signed)
1. Which medications need refilled? (List name and dosage, if known) carbidopa-levodopa  2. Which pharmacy/location is medication to be sent to? (include street and city if local pharmacy[facility]

## 2024-01-13 ENCOUNTER — Other Ambulatory Visit: Payer: Self-pay

## 2024-01-13 DIAGNOSIS — G20A1 Parkinson's disease without dyskinesia, without mention of fluctuations: Secondary | ICD-10-CM

## 2024-01-13 MED ORDER — CARBIDOPA-LEVODOPA 25-100 MG PO TABS: ORAL_TABLET | ORAL | 0 refills | Status: DC

## 2024-01-29 DIAGNOSIS — Z96612 Presence of left artificial shoulder joint: Secondary | ICD-10-CM | POA: Diagnosis not present

## 2024-01-29 DIAGNOSIS — Z4789 Encounter for other orthopedic aftercare: Secondary | ICD-10-CM | POA: Diagnosis not present

## 2024-02-12 ENCOUNTER — Telehealth: Payer: Self-pay

## 2024-02-12 NOTE — Telephone Encounter (Signed)
 Spoke with patient and advised to call Vascular and Vein specialty about Cardio appt before. Only see referral for Vascular and Vein in chart. Pt acknowledged understanding.  Copied from CRM [id number]. Topic: Clinical - Medical Advice >> Feb 12, 2024 10:18 AM Harlene ORN wrote: Reason for CRM: Patient called to follow up on upcoming vascular surgery on 10/10. Wants to know why he is seeing a Cardiologist before his new varicose vein. Please advise.

## 2024-03-03 ENCOUNTER — Telehealth: Payer: Self-pay

## 2024-03-03 NOTE — Telephone Encounter (Signed)
 Mr. Muckey called to verify that he had 2 appointments on 04/24/24. He was told one was for the reflux study and the other to see the provider.  He also requested location information which was provided.  He verbalized understanding of the information provided.

## 2024-03-17 ENCOUNTER — Other Ambulatory Visit: Payer: Self-pay | Admitting: Family Medicine

## 2024-03-17 NOTE — Telephone Encounter (Signed)
 Copied from CRM #[id number]. Topic: Clinical - Medication Refill >> Mar 17, 2024  4:56 PM Taleah C wrote: Medication: hydrochlorothiazide   Has the patient contacted their pharmacy? Yes Yes they told them to contact the office  This is the patient's preferred pharmacy:  [facility] - [place], [place] - [address] [place] [address] Phone: [phone] Fax: [phone]   Is this the correct pharmacy for this prescription? Yes If no, delete pharmacy and type the correct one.   Has the prescription been filled recently? Yes  Is the patient out of the medication? No  Has the patient been seen for an appointment in the last year OR does the patient have an upcoming appointment? No  Can we respond through MyChart? No  Agent: Please be advised that Rx refills may take up to 3 business days. We ask that you follow-up with your pharmacy.

## 2024-03-18 MED ORDER — HYDROCHLOROTHIAZIDE 12.5 MG PO CAPS: 12.5000 mg | ORAL_CAPSULE | Freq: Every day | ORAL | 0 refills | Status: DC

## 2024-03-26 ENCOUNTER — Other Ambulatory Visit: Payer: Self-pay

## 2024-03-26 DIAGNOSIS — I890 Lymphedema, not elsewhere classified: Secondary | ICD-10-CM

## 2024-04-03 ENCOUNTER — Encounter

## 2024-04-03 ENCOUNTER — Encounter (HOSPITAL_COMMUNITY)

## 2024-04-08 NOTE — Progress Notes (Signed)
 Assessment/Plan:   1.  Parkinsons Disease  - weaning pramipexole  1 mg tid to 0.5 mg tid.  Having EDS, even while driving so wife doing driving.    -Increase carbidopa /levodopa  25/100, 2 tablets at 6 AM/ 2 tablets at 9:30am/1 at 1pm/ 1 at 5pm  - He has seen derm.  He knows that Parkinsons Disease increases risk for melanoma.   -RX PT  - We discussed Vyalev. 2.  Lymphedema  -has appt with vascular surgery in few weeks 3.  Chronic LBP  -seeking opinion from another surgeon per pt  Subjective:   [name] [name] was seen today in follow up for Parkinsons disease.  My previous records were reviewed prior to todays visit as well as outside records available to me.  Pts pramipexole  increased last visit.  Tolerated it well, without SE.  No hallucinations.  No compulsive behaviors.  No falls.  He had shoulder replacement since last visit and that went well but still issues with lymphedema.  Plans to see vascular surgeon soon.  Not been able to exercise due to lymphedema and back issues.  Balance is now worse with the surgeries too.  Some freezing in small spaces and describes start hesitation.  Has difficulty with sleep.  He is also thinking he needs an abdominal hernia repair sx.  No hallucinations.  He is having sleep attacks - he calls them micronaps so wife is doing most of the driving.  One fall since last visit - sleeping in chair and fell out of it.  Happened right after the shoulder sx.  Current prescribed movement disorder medications: Pramipexole  1 mg 3 times per day (increased) Carbidopa /levodopa  25/100, 2/2/1    PREVIOUS MEDICATIONS: Sinemet  and Mirapex   ALLERGIES:   Allergies  Allergen Reactions   Penicillins Rash and Other (See Comments)    Did it involve swelling of the face/tongue/throat, SOB, or low BP? No Did it involve sudden or severe rash/hives, skin peeling, or any reaction on the inside of your mouth or nose? No Did you need to seek medical attention at a  hospital or doctor's office? No When did it last happen?      childhood allergy If all above answers are "NO", may proceed with cephalosporin use.      CURRENT MEDICATIONS:  Outpatient Encounter Medications as of 04/14/2024  Medication Sig   albuterol  (VENTOLIN  HFA) 108 (90 Base) MCG/ACT inhaler Inhale 2 puffs into the lungs every 4 (four) hours as needed for wheezing or shortness of breath.   atorvastatin  (LIPITOR) 20 MG tablet Take 1 tablet (20 mg total) by mouth daily.   carbidopa -levodopa  (SINEMET  IR) 25-100 MG tablet 2 at 6 AM/ 2 at 10 AM/1 at 3 PM   doxycycline  (VIBRA -TABS) 100 MG tablet Take 1 tablet (100 mg total) by mouth 2 (two) times daily.   ELIQUIS  2.5 MG TABS tablet Take 1 tablet (2.5 mg total) by mouth 2 (two) times daily.   esomeprazole  (NEXIUM ) 20 MG capsule Take 1 capsule (20 mg total) by mouth 2 (two) times daily before a meal.   finasteride  (PROSCAR ) 5 MG tablet TAKE 1 TABLET DAILY (NEED AN APPOINTMENT)   fluticasone -salmeterol (ADVAIR  HFA) 230-21 MCG/ACT inhaler Inhale 2 puffs into the lungs 2 (two) times daily.   gabapentin  (NEURONTIN ) 100 MG capsule Take 1 capsule (100 mg total) by mouth 3 (three) times daily.   hydrochlorothiazide  (MICROZIDE ) 12.5 MG capsule Take 1 capsule (12.5 mg total) by mouth daily.   phentermine  37.5 MG capsule Take  1 capsule (37.5 mg total) by mouth every morning.   polyethylene glycol (MIRALAX  / GLYCOLAX ) packet Take 8.5 g by mouth at bedtime.   potassium chloride  (KLOR-CON ) 10 MEQ tablet TAKE 1 TABLET DAILY   pramipexole  (MIRAPEX ) 1 MG tablet Take 1 tablet (1 mg total) by mouth 3 (three) times daily.   silver  sulfADIAZINE  (SILVADENE ) 1 % cream Apply 1 Application topically daily.   tamsulosin  (FLOMAX ) 0.4 MG CAPS capsule TAKE 1 CAPSULE(0.4 MG) BY MOUTH DAILY   No facility-administered encounter medications on file as of 04/14/2024.    Objective:   PHYSICAL EXAMINATION:    VITALS:   Vitals:   04/14/24 1058  BP: (!) 142/86  Pulse: 76   SpO2: 98%  Weight: 262 lb 9.6 oz (119.1 kg)    GEN:  The patient appears stated age and is in NAD. HEENT:  Normocephalic, atraumatic. CV:  RRR Lungs:  CTAB Neck  no bruits Exts:  there is LE edema b/l  Neurological examination:  Orientation: The patient is alert and oriented x3. Cranial nerves: There is good facial symmetry with min facial hypomimia (lips parted). The speech is fluent and clear.  Hearing is intact to conversational tone. Sensation: Sensation is intact to light touch throughout Motor: Strength is at least antigravity x 4  Movement examination: Tone: There is nl tone in the UE/LE Abnormal movements: there is no tremor today Coordination:  There is no decremation, with any form of RAMS, including alternating supination and pronation of the forearm, hand opening and closing, finger taps.  However, he does have trouble with toe taps and heel taps bilaterally, but he reports that it is because of the heaviness of the legs from lymphedema. Gait and Station: The patient has trouble getting OOC.  He pushes off to arise.  Once in the hall, he does okay until he turns and then he trips over the cane but does catch himself.    I have reviewed and interpreted the following labs independently    Chemistry      Component Value Date/Time   NA 139 08/09/2022 0957   K 4.0 08/09/2022 0957   CL 100 08/09/2022 0957   CO2 29 08/09/2022 0957   BUN 15 08/09/2022 0957   CREATININE 0.75 08/09/2022 0957   CREATININE 0.65 (L) 02/24/2020 1208      Component Value Date/Time   CALCIUM  9.3 08/09/2022 0957   ALKPHOS 89 08/09/2022 0957   AST 17 08/09/2022 0957   ALT 8 08/09/2022 0957   BILITOT 2.4 (H) 08/09/2022 0957       Lab Results  Component Value Date   WBC 5.3 08/09/2022   HGB 15.0 08/09/2022   HCT 42.0 08/09/2022   MCV 89.1 08/09/2022   PLT 203.0 08/09/2022    Lab Results  Component Value Date   TSH 2.04 08/09/2022   Total time spent on today's visit was 30  minutes, including both face-to-face time and nonface-to-face time.  Time included that spent on review of records (prior notes available to me/labs/imaging if pertinent), discussing treatment and goals, answering patient's questions and coordinating care.   Cc:  [name] [name] LABOR, MD

## 2024-04-14 ENCOUNTER — Encounter: Payer: Self-pay | Admitting: Neurology

## 2024-04-14 ENCOUNTER — Ambulatory Visit (INDEPENDENT_AMBULATORY_CARE_PROVIDER_SITE_OTHER): Admitting: Neurology

## 2024-04-14 VITALS — BP 142/86 | HR 76 | Wt 262.6 lb

## 2024-04-14 DIAGNOSIS — I890 Lymphedema, not elsewhere classified: Secondary | ICD-10-CM | POA: Diagnosis not present

## 2024-04-14 DIAGNOSIS — G8929 Other chronic pain: Secondary | ICD-10-CM

## 2024-04-14 DIAGNOSIS — M5442 Lumbago with sciatica, left side: Secondary | ICD-10-CM | POA: Diagnosis not present

## 2024-04-14 DIAGNOSIS — M5441 Lumbago with sciatica, right side: Secondary | ICD-10-CM | POA: Diagnosis not present

## 2024-04-14 DIAGNOSIS — G20A1 Parkinson's disease without dyskinesia, without mention of fluctuations: Secondary | ICD-10-CM

## 2024-04-14 MED ORDER — CARBIDOPA-LEVODOPA 25-100 MG PO TABS: ORAL_TABLET | ORAL | 1 refills | Status: AC

## 2024-04-14 MED ORDER — PRAMIPEXOLE DIHYDROCHLORIDE 0.5 MG PO TABS: 0.5000 mg | ORAL_TABLET | Freq: Three times a day (TID) | ORAL | 1 refills | Status: AC

## 2024-04-14 NOTE — Patient Instructions (Addendum)
 Decrease pramipexole  1 mg in the AM and afternoon and 0.5 mg in the evening x 1 week and then decrease pramipexole  1 mg in the AM and 0.5 mg in the afternoon and evening x 1 week and then decrease pramipexole  0.5 mg three times per day thereafter  Increase carbidopa /levodopa  25/100, 2 tablets at 6 AM/ 2 tablets at 9:30am/1 at 1pm/ 1 at 5pm  We will order physical therapy  Join us  on social media and stay up to date with what is going on in the [place] regarding Parkinson's Disease and Movement Disorders!

## 2024-04-15 ENCOUNTER — Telehealth: Payer: Self-pay | Admitting: Neurology

## 2024-04-15 NOTE — Telephone Encounter (Signed)
 Pt. Would like Rx clarification call

## 2024-04-15 NOTE — Telephone Encounter (Signed)
 Called Pate[facility] and answered questions

## 2024-04-21 ENCOUNTER — Telehealth: Payer: Self-pay | Admitting: Neurology

## 2024-04-21 NOTE — Telephone Encounter (Signed)
 Pt has questions on physical therapy

## 2024-04-22 ENCOUNTER — Other Ambulatory Visit: Payer: Self-pay

## 2024-04-22 DIAGNOSIS — M5442 Lumbago with sciatica, left side: Secondary | ICD-10-CM

## 2024-04-22 DIAGNOSIS — G20A1 Parkinson's disease without dyskinesia, without mention of fluctuations: Secondary | ICD-10-CM

## 2024-04-22 NOTE — Telephone Encounter (Signed)
 Called patient he had not received call about Pt sent to [facility] in [place] and called to make sure they see PD and they do

## 2024-04-24 ENCOUNTER — Ambulatory Visit (HOSPITAL_COMMUNITY)
Admission: RE | Admit: 2024-04-24 | Discharge: 2024-04-24 | Disposition: A | Discharge status: Home / Self Care | Source: Ambulatory Visit | Attending: Vascular Surgery | Admitting: Vascular Surgery

## 2024-04-24 ENCOUNTER — Ambulatory Visit: Attending: Vascular Surgery | Admitting: Physician Assistant

## 2024-04-24 VITALS — BP 122/76 | HR 84 | Temp 97.8°F | Wt 259.5 lb

## 2024-04-24 DIAGNOSIS — I872 Venous insufficiency (chronic) (peripheral): Secondary | ICD-10-CM | POA: Diagnosis not present

## 2024-04-24 DIAGNOSIS — I890 Lymphedema, not elsewhere classified: Secondary | ICD-10-CM | POA: Diagnosis not present

## 2024-04-24 NOTE — Progress Notes (Signed)
 Requested by:  Johnny Garnette LABOR, MD [address] [place],  [place] [address]  Reason for consultation: lymphedema    History of Present Illness   [name] [name] is a 73 y.o. (Aug 16, 1950) male who presents for evaluation of lymphedema.  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   He returns today for repeat evaluation.  He says that he had sudden onset bilateral lower extremity swelling after his total right knee replacement in 2020.  He says it took about 7 months for his swelling to get better.  Then 2 years ago he had a left total knee replacement and his bilateral lower leg swelling returned.  Now his swelling is always present.  He has already been diagnosed with lymphedema and venous insufficiency. He says that he has episodes where his swelling gets significantly worse and causes ulcerations and/or cellulitis.  He was previously seen by the wound care center for his ulcerations, which recently healed in December.  The wound care center also prescribed him lymphatic pumps which he started using in January of this year.  He also wears Velcro wraps on his lower legs to act as compression.  He says unfortunately even with lymphatic pumps and compressive wraps, his lower leg swelling is hard to control.  His leg swelling will improve for a short period, but within an hour his legs are swollen again.   He is requesting information on nearby lymphedema clinics.  He says that he has been to a lymphedema clinic 1 time and they taught him some lymphatic massage.  His wife has been massaging his legs daily for the past couple of weeks, which has helped with his swelling.  Currently he is not able to see the lymphedema clinic again until December.  Past Medical History:  Diagnosis Date   Arthritis    Barrett's esophagus    sees Dr. Princella Nida    BPH with urinary obstruction    Complication of  anesthesia    Urinary retention. fights it- is what he was told. 1990's - vomitted and aspirated in surgery   Diverticulosis    Esophageal dysmotility    GERD (gastroesophageal reflux disease)    not since Nessen Fundopliation   History of hiatal hernia    History of kidney stones    15 in 2015- last one was in Oct 2015- takes HCTZ to help prevent   Hyperlipidemia    Insomnia    Lyme disease 2013   Parkinson's disease (HCC)    sees Dr. Asberry Tat    PONV (postoperative nausea and vomiting)    [facility] spotted fever) 2012    Past Surgical History:  Procedure Laterality Date   CHOLECYSTECTOMY     COLONOSCOPY  06/24/2013   per Dr. Nida, no polyps, diveticulosis only, repeat in 10 yrs    ESOPHAGOGASTRODUODENOSCOPY  06/24/2013   per Dr. Nida, no signs of Barretts, repeat in 5 yrs    HERNIA REPAIR Right    right inguinal   LASIK     bilateral   lipomas removed     x10/ on arms and legs   NISSEN FUNDOPLICATION  03/13/2010   laparoscopic per Dr. Donnice Lunger   ORCHIOPEXY     right side at age 28    TONSILLECTOMY AND ADENOIDECTOMY     TOTAL HIP ARTHROPLASTY Left 05/26/2018   Procedure: TOTAL HIP ARTHROPLASTY ANTERIOR  APPROACH;  Surgeon: Yvone Rush, MD;  Location: [facility] OR;  Service: Orthopedics;  Laterality: Left;   TOTAL KNEE ARTHROPLASTY Right 06/01/2019   Procedure: RIGHT TOTAL KNEE ARTHROPLASTY;  Surgeon: Liam Lerner, MD;  Location: [facility] ORS;  Service: Orthopedics;  Laterality: Right;   TOTAL KNEE ARTHROPLASTY Left 03/22/2023   per Dr. Liam   TOTAL SHOULDER ARTHROPLASTY Right 06/23/2015   Procedure: RIGHT TOTAL SHOULDER ARTHROPLASTY;  Surgeon: Franky Pointer, MD;  Location: [facility] OR;  Service: Orthopedics;  Laterality: Right;    Social History   Socioeconomic History   Marital status: Married    Spouse name: Not on file   Number of children: 2   Years of education: Not on file   Highest education level: Master's degree (e.g., MA, MS, MEng, MEd, MSW, MBA)   Occupational History   Not on file  Tobacco Use   Smoking status: Never   Smokeless tobacco: Never  Vaping Use   Vaping status: Never Used  Substance and Sexual Activity   Alcohol use: No    Alcohol/week: 0.0 standard drinks of alcohol   Drug use: No   Sexual activity: Not on file  Other Topics Concern   Not on file  Social History Narrative   Right handed   4 story home   Drinks caffeine   Social Drivers of Health   Financial Resource Strain: Low Risk  (12/27/2020)   Overall Financial Resource Strain (CARDIA)    Difficulty of Paying Living Expenses: Not hard at all  Food Insecurity: No Food Insecurity (12/27/2020)   Hunger Vital Sign    Worried About Running Out of Food in the Last Year: Never true    Ran Out of Food in the Last Year: Never true  Transportation Needs: No Transportation Needs (12/27/2020)   PRAPARE - Administrator, Civil Service (Medical): No    Lack of Transportation (Non-Medical): No  Physical Activity: Sufficiently Active (12/27/2020)   Exercise Vital Sign    Days of Exercise per Week: 5 days    Minutes of Exercise per Session: 60 min  Stress: No Stress Concern Present (12/27/2020)   [employer] of Occupational Health - Occupational Stress Questionnaire    Feeling of Stress : Not at all  Social Connections: Moderately Isolated (12/27/2020)   Social Connection and Isolation Panel    Frequency of Communication with Friends and Family: Twice a week    Frequency of Social Gatherings with Friends and Family: Twice a week    Attends Religious Services: Never    Database administrator or Organizations: No    Attends Banker Meetings: Never    Marital Status: Married  Catering manager Violence: Not At Risk (12/27/2020)   Humiliation, Afraid, Rape, and Kick questionnaire    Fear of Current or Ex-Partner: No    Emotionally Abused: No    Physically Abused: No    Sexually Abused: No    Family History  Problem Relation Age of  Onset   Arthritis Mother    Cancer Mother    Esophageal cancer Father    Lung cancer Maternal Grandfather    Esophageal cancer Paternal Grandmother    Lung cancer Paternal Grandfather    Colon cancer Sister        questionable   Hypertension Brother        over [employer]   Healthy Daughter    Esophageal cancer Sister    Healthy Daughter    Esophageal cancer Paternal Aunt  x 2   Esophageal cancer Cousin    Rectal cancer Neg Hx    Stomach cancer Neg Hx     Current Outpatient Medications  Medication Sig Dispense Refill   albuterol  (VENTOLIN  HFA) 108 (90 Base) MCG/ACT inhaler Inhale 2 puffs into the lungs every 4 (four) hours as needed for wheezing or shortness of breath. 18 g 1   atorvastatin  (LIPITOR) 20 MG tablet Take 1 tablet (20 mg total) by mouth daily. 90 tablet 3   carbidopa -levodopa  (SINEMET  IR) 25-100 MG tablet 2 tablets at 6 AM/ 2 tablets at 9:30am/1 at 1pm/ 1 at 5pm 540 tablet 1   doxycycline  (VIBRA -TABS) 100 MG tablet Take 1 tablet (100 mg total) by mouth 2 (two) times daily. 60 tablet 5   ELIQUIS  2.5 MG TABS tablet Take 1 tablet (2.5 mg total) by mouth 2 (two) times daily. (Patient not taking: Reported on 04/24/2024) 180 tablet 3   esomeprazole  (NEXIUM ) 20 MG capsule Take 1 capsule (20 mg total) by mouth 2 (two) times daily before a meal. 180 capsule 3   finasteride  (PROSCAR ) 5 MG tablet TAKE 1 TABLET DAILY (NEED AN APPOINTMENT) 90 tablet 3   fluticasone -salmeterol (ADVAIR  HFA) 230-21 MCG/ACT inhaler Inhale 2 puffs into the lungs 2 (two) times daily. 3 each 3   gabapentin  (NEURONTIN ) 100 MG capsule Take 1 capsule (100 mg total) by mouth 3 (three) times daily.     hydrochlorothiazide  (MICROZIDE ) 12.5 MG capsule Take 1 capsule (12.5 mg total) by mouth daily. 90 capsule 0   phentermine  37.5 MG capsule Take 1 capsule (37.5 mg total) by mouth every morning. 90 capsule 1   polyethylene glycol (MIRALAX  / GLYCOLAX ) packet Take 8.5 g by mouth at bedtime.     potassium chloride   (KLOR-CON ) 10 MEQ tablet TAKE 1 TABLET DAILY 90 tablet 3   pramipexole  (MIRAPEX ) 0.5 MG tablet Take 1 tablet (0.5 mg total) by mouth 3 (three) times daily. 270 tablet 1   silver  sulfADIAZINE  (SILVADENE ) 1 % cream Apply 1 Application topically daily.     tamsulosin  (FLOMAX ) 0.4 MG CAPS capsule TAKE 1 CAPSULE(0.4 MG) BY MOUTH DAILY 90 capsule 3   No current facility-administered medications for this visit.    Allergies  Allergen Reactions   Penicillins Rash and Other (See Comments)    Did it involve swelling of the face/tongue/throat, SOB, or low BP? No Did it involve sudden or severe rash/hives, skin peeling, or any reaction on the inside of your mouth or nose? No Did you need to seek medical attention at a hospital or doctor's office? No When did it last happen?      childhood allergy If all above answers are "NO", may proceed with cephalosporin use.      REVIEW OF SYSTEMS (negative unless checked):   Cardiac:  []  Chest pain or chest pressure? []  Shortness of breath upon activity? []  Shortness of breath when lying flat? []  Irregular heart rhythm?  Vascular:  []  Pain in calf, thigh, or hip brought on by walking? []  Pain in feet at night that wakes you up from your sleep? []  Blood clot in your veins? [x]  Leg swelling?  Pulmonary:  []  Oxygen at home? []  Productive cough? []  Wheezing?  Neurologic:  []  Sudden weakness in arms or legs? []  Sudden numbness in arms or legs? []  Sudden onset of difficult speaking or slurred speech? []  Temporary loss of vision in one eye? []  Problems with dizziness?  Gastrointestinal:  []  Blood in stool? []  Vomited blood?  Genitourinary:  []  Burning when urinating? []  Blood in urine?  Psychiatric:  []  Major depression  Hematologic:  []  Bleeding problems? []  Problems with blood clotting?  Dermatologic:  []  Rashes or ulcers?  Constitutional:  []  Fever or chills?  Ear/Nose/Throat:  []  Change in hearing? []  Nose bleeds? []  Sore  throat?  Musculoskeletal:  []  Back pain? []  Joint pain? []  Muscle pain?   Physical Examination     Vitals:   04/24/24 1242  BP: 122/76  Pulse: 84  Temp: 97.8 F (36.6 C)  TempSrc: Temporal  Weight: 259 lb 8 oz (117.7 kg)   Body mass index is 35.19 kg/m.  General:  WDWN in NAD; vital signs documented above Gait: Not observed HENT: WNL, normocephalic Pulmonary: normal non-labored breathing  Cardiac: Regular Abdomen: soft, NT, no masses Skin: without rashes Vascular Exam/Pulses: Brisk DP/PT Doppler signals bilaterally Extremities: 3+ pitting edema of bilateral lower legs and feet, with stasis pigmentation, cobblestoning, and hypertrophic tissue Musculoskeletal: no muscle wasting or atrophy  Neurologic: A&O X 3;  No focal weakness or paresthesias are detected Psychiatric:  The pt has Normal affect.   Non-invasive Vascular Imaging   LLE Venous Insufficiency Duplex (04/24/2024):  +---------------------------+---------+------+-----------+------------+----  ----+  LEFT                      Reflux NoRefluxReflux TimeDiameter  cmsComments                                      Yes                                    +---------------------------+---------+------+-----------+------------+----  ----+  CFV                       no                                               +---------------------------+---------+------+-----------+------------+----  ----+  FV mid                     no                                               +---------------------------+---------+------+-----------+------------+----  ----+  Popliteal                 no                                               +---------------------------+---------+------+-----------+------------+----  ----+  GSV at SFJ                 no                            .84                +---------------------------+---------+------+-----------+------------+----  ----+   GSV prox thigh  no                            .32                +---------------------------+---------+------+-----------+------------+----  ----+  GSV mid thigh              no                            .48                +---------------------------+---------+------+-----------+------------+----  ----+  GSV dist thigh             no                            .49                +---------------------------+---------+------+-----------+------------+----  ----+  GSV at knee                no                            .50                +---------------------------+---------+------+-----------+------------+----  ----+  GSV prox calf              no                            .38                +---------------------------+---------+------+-----------+------------+----  ----+  GSV mid calf                         yes    >500 ms      .23                +---------------------------+---------+------+-----------+------------+----  ----+  GSV dist calf                        yes    >500 ms      .22                +---------------------------+---------+------+-----------+------------+----  ----+  SSV at [facility]                           yes    >500 ms      .53                +---------------------------+---------+------+-----------+------------+----  ----+  SSV prox calf              no                            .45                +---------------------------+---------+------+-----------+------------+----  ----+  SSV mid calf               no                            .35                +---------------------------+---------+------+-----------+------------+----  ----+  Varicosity proximal calf   no                            .22                +---------------------------+---------+------+-----------+------------+----  ----+  Varicosity proximal                  yes    >500 ms                          posterior calf                                                              +---------------------------+---------+------+-----------+------------+----  ----+     Medical Decision Making   [name] [name] is a 73 y.o. male who presents for evaluation of bilateral leg swelling  Based on the patient's duplex, there is reflux in the left greater saphenous vein in the mid and distal calf.  There is also reflux in the small saphenous vein at the [facility].  The remainder of the patient's deep and superficial venous system on the left is competent.  He would not be a candidate for greater saphenous vein ablation given that it is competent throughout the thigh. The patient has a long history of bilateral lower extremity swelling since a right total knee replacement in 2020.  This briefly went away for about a year until he had a left total knee replacement 2 years ago.  Since then he has had persistent, bilateral lower extremity swelling.  He has already been diagnosed with venous insufficiency and lymphedema.  The patient states that his lower legs and feet are always swollen.  He goes through episodes where his lower extremity swelling significantly worsens, causing cellulitis and/or ulcerations.  He was previously under the care of a wound care center for ulcerations, which completely healed by December.  His wound care center has also prescribed him lymphedema pumps. Unfortunately the patient continues to have significant bilateral lower extremity swelling despite the use of Velcro compression wraps, leg elevation, and lymphatic pumps.  His swelling will only improve for a short period of time after his lymphatic pump treatment sessions.  He has seen a lymphedema center once.  He says that they taught him how to perform lymphatic massage on his legs, which has helped with his swelling over the past couple of weeks.  He is requesting more information on other nearby lymphedema  clinics. On exam the patient has 3+ pitting edema of bilateral lower legs and feet.  His exam is consistent with lymphedema, with skin hypertrophy and cobblestoning. I have explained that the patient does have venous insufficiency in the left lower extremity, however this is mild and he is not a candidate for ablation.  Most of his symptoms seem to be caused by his lymphedema.  I have recommended he continue compressive therapy, leg elevation, and lymphedema pumps.  I have given him a list of several nearby lymphedema clinics that he can try to call. He can follow-up with our office as needed   Ahmed SHAUNNA Holster, PA-C [facility] Office: [phone]  Clinic MD: [place]

## 2024-04-27 ENCOUNTER — Telehealth: Payer: Self-pay | Admitting: Family Medicine

## 2024-04-27 NOTE — Telephone Encounter (Signed)
 FYI Pt has appointment scheduled for 05/01/24

## 2024-04-27 NOTE — Telephone Encounter (Signed)
 Copied from CRM [id number]. Topic: Referral - Request for Referral >> Apr 27, 2024 10:41 AM Drema MATSU wrote: Did the patient discuss referral with their provider in the last year? Yes (If No - schedule appointment) (If Yes - send message)  Appointment offered? no  Type of order/referral and detailed reason for visit: lymphedema clinic for lymphedema treatments  Preference of office, provider, location:  [facility]  If referral order, have you been seen by this specialty before? No (If Yes, this issue or another issue? When? Where?  Can we respond through MyChart? Yes

## 2024-05-01 ENCOUNTER — Ambulatory Visit (INDEPENDENT_AMBULATORY_CARE_PROVIDER_SITE_OTHER): Admitting: Family Medicine

## 2024-05-01 ENCOUNTER — Encounter: Payer: Self-pay | Admitting: Family Medicine

## 2024-05-01 VITALS — BP 120/80 | HR 89 | Temp 97.8°F | Ht 72.0 in | Wt 252.0 lb

## 2024-05-01 DIAGNOSIS — G20B2 Parkinson's disease with dyskinesia, with fluctuations: Secondary | ICD-10-CM

## 2024-05-01 DIAGNOSIS — R739 Hyperglycemia, unspecified: Secondary | ICD-10-CM | POA: Diagnosis not present

## 2024-05-01 DIAGNOSIS — I890 Lymphedema, not elsewhere classified: Secondary | ICD-10-CM

## 2024-05-01 DIAGNOSIS — M5416 Radiculopathy, lumbar region: Secondary | ICD-10-CM

## 2024-05-01 DIAGNOSIS — Z1211 Encounter for screening for malignant neoplasm of colon: Secondary | ICD-10-CM

## 2024-05-01 DIAGNOSIS — N138 Other obstructive and reflux uropathy: Secondary | ICD-10-CM

## 2024-05-01 DIAGNOSIS — I10 Essential (primary) hypertension: Secondary | ICD-10-CM | POA: Diagnosis not present

## 2024-05-01 DIAGNOSIS — N401 Enlarged prostate with lower urinary tract symptoms: Secondary | ICD-10-CM

## 2024-05-01 DIAGNOSIS — E785 Hyperlipidemia, unspecified: Secondary | ICD-10-CM | POA: Diagnosis not present

## 2024-05-01 DIAGNOSIS — J4540 Moderate persistent asthma, uncomplicated: Secondary | ICD-10-CM

## 2024-05-01 DIAGNOSIS — K429 Umbilical hernia without obstruction or gangrene: Secondary | ICD-10-CM

## 2024-05-01 DIAGNOSIS — K2270 Barrett's esophagus without dysplasia: Secondary | ICD-10-CM

## 2024-05-01 LAB — LIPID PANEL
Cholesterol: 141 mg/dL (ref 0–200)
HDL: 38.1 mg/dL — ABNORMAL LOW (ref >39.00)
LDL Cholesterol: 61 mg/dL (ref 0–99)
NonHDL: 102.48
Total CHOL/HDL Ratio: 4
Triglycerides: 206 mg/dL — ABNORMAL HIGH (ref 0.0–149.0)
VLDL: 41.2 mg/dL — ABNORMAL HIGH (ref 0.0–40.0)

## 2024-05-01 LAB — CBC WITH DIFFERENTIAL/PLATELET
Basophils Absolute: 0.1 K/uL (ref 0.0–0.1)
Basophils Relative: 1.1 % (ref 0.0–3.0)
Eosinophils Absolute: 0.1 K/uL (ref 0.0–0.7)
Eosinophils Relative: 1.3 % (ref 0.0–5.0)
HCT: 40.4 % (ref 39.0–52.0)
Hemoglobin: 14.1 g/dL (ref 13.0–17.0)
Lymphocytes Relative: 18.2 % (ref 12.0–46.0)
Lymphs Abs: 1 K/uL (ref 0.7–4.0)
MCHC: 34.8 g/dL (ref 30.0–36.0)
MCV: 86.9 fl (ref 78.0–100.0)
Monocytes Absolute: 0.5 K/uL (ref 0.1–1.0)
Monocytes Relative: 8 % (ref 3.0–12.0)
Neutro Abs: 4 K/uL (ref 1.4–7.7)
Neutrophils Relative %: 71.4 % (ref 43.0–77.0)
Platelets: 199 K/uL (ref 150.0–400.0)
RBC: 4.65 Mil/uL (ref 4.22–5.81)
RDW: 13.8 % (ref 11.5–15.5)
WBC: 5.6 K/uL (ref 4.0–10.5)

## 2024-05-01 LAB — HEPATIC FUNCTION PANEL
ALT: 6 U/L (ref 0–53)
AST: 13 U/L (ref 0–37)
Albumin: 4.3 g/dL (ref 3.5–5.2)
Alkaline Phosphatase: 112 U/L (ref 39–117)
Bilirubin, Direct: 0.4 mg/dL — ABNORMAL HIGH (ref 0.0–0.3)
Total Bilirubin: 3.1 mg/dL — ABNORMAL HIGH (ref 0.2–1.2)
Total Protein: 6.6 g/dL (ref 6.0–8.3)

## 2024-05-01 LAB — BASIC METABOLIC PANEL WITH GFR
BUN: 15 mg/dL (ref 6–23)
CO2: 28 meq/L (ref 19–32)
Calcium: 9.1 mg/dL (ref 8.4–10.5)
Chloride: 101 meq/L (ref 96–112)
Creatinine, Ser: 0.71 mg/dL (ref 0.40–1.50)
GFR: 91.68 mL/min (ref >60.00)
Glucose, Bld: 109 mg/dL — ABNORMAL HIGH (ref 70–99)
Potassium: 3.8 meq/L (ref 3.5–5.1)
Sodium: 139 meq/L (ref 135–145)

## 2024-05-01 LAB — TSH: TSH: 1.89 u[IU]/mL (ref 0.35–5.50)

## 2024-05-01 LAB — HEMOGLOBIN A1C: Hgb A1c MFr Bld: 6.1 % (ref 4.6–6.5)

## 2024-05-01 LAB — PSA: PSA: 0.49 ng/mL (ref 0.10–4.00)

## 2024-05-01 MED ORDER — HYDROCODONE-ACETAMINOPHEN 5-325 MG PO TABS: 1.0000 | ORAL_TABLET | Freq: Three times a day (TID) | ORAL | 0 refills | Status: AC | PRN

## 2024-05-01 NOTE — Progress Notes (Signed)
 Subjective:    Patient ID: [name] [name], male    DOB: 1951-02-02, 73 y.o.   MRN: [id number]  HPI Here to follow up on issues. He is seeing Dr. Evonnie for Parkinson's disease, and this has progressed a little in the past year. She recently saw him and increased the dose of his Carbidopa -Levodopa . He has had more problems with balance, and he has fallen twice recently. She has referred him to [facility] in [place], [place]. He had a left total shoulder replacement in April, and this went well. He has been referred to the [facility] Lymphedema clinic, and he will see them next week. He is still wearing compression stockings and he gets frequent massages. He is past due for a colonoscopy. We have been monitoring an umbilical hernia for a couple years, and this has gotten a little larger and is causing more discomfort in the past year. He has had chronic low back pain for years, and he has used Ibuprofen  and Tramadol  to treat this. However the pain has become more severe, and he has trouble standing or walking for any lengths of time. He asks for something stronger to use for pain only when he needs it.    Review of Systems  Constitutional: Negative.   HENT: Negative.    Eyes: Negative.   Respiratory: Negative.    Cardiovascular:  Positive for leg swelling.  Gastrointestinal: Negative.   Genitourinary: Negative.   Musculoskeletal:  Positive for back pain.  Skin: Negative.   Neurological:  Positive for tremors.  Psychiatric/Behavioral: Negative.         Objective:   Physical Exam Constitutional:      General: He is not in acute distress.    Appearance: He is well-developed. He is not diaphoretic.     Comments: Walks with a cane   HENT:     Head: Normocephalic and atraumatic.     Right Ear: External ear normal.     Left Ear: External ear normal.     Nose: Nose normal.     Mouth/Throat:     Pharynx: No oropharyngeal exudate.  Eyes:     General: No scleral icterus.        Right eye: No discharge.        Left eye: No discharge.     Conjunctiva/sclera: Conjunctivae normal.     Pupils: Pupils are equal, round, and reactive to light.  Neck:     Thyroid : No thyromegaly.     Vascular: No JVD.     Trachea: No tracheal deviation.  Cardiovascular:     Rate and Rhythm: Normal rate and regular rhythm.     Pulses: Normal pulses.     Heart sounds: Normal heart sounds. No murmur heard.    No friction rub. No gallop.  Pulmonary:     Effort: Pulmonary effort is normal. No respiratory distress.     Breath sounds: Normal breath sounds. No wheezing or rales.  Chest:     Chest wall: No tenderness.  Abdominal:     General: Bowel sounds are normal. There is no distension.     Palpations: Abdomen is soft.     Tenderness: There is no abdominal tenderness. There is no guarding or rebound.     Comments: 3 cm tender but reducible umbilical hernia   Genitourinary:    Penis: Normal. No tenderness.      Testes: Normal.  Musculoskeletal:        General: No tenderness. Normal range  of motion.     Cervical back: Neck supple.  Lymphadenopathy:     Cervical: No cervical adenopathy.  Skin:    General: Skin is warm and dry.     Coloration: Skin is not pale.     Findings: No erythema or rash.  Neurological:     General: No focal deficit present.     Mental Status: He is alert and oriented to person, place, and time.     Cranial Nerves: No cranial nerve deficit.     Motor: No abnormal muscle tone.     Coordination: Coordination normal.     Deep Tendon Reflexes: Reflexes are normal and symmetric. Reflexes normal.  Psychiatric:        Mood and Affect: Mood normal.        Behavior: Behavior normal.        Thought Content: Thought content normal.        Judgment: Judgment normal.           Assessment & Plan:  His Parkinson's disease has progressed a bit. He will follow up with Dr. Evonnie, and he will begin PT next week. His lymphedema has improved a little, and he will see  the Lymphedema Clinic next week. His HTN and asthma and BPH are stable. His low back pain has worsened, and we will provide him with a small supply of Norco 5-325 to use as needed. Refer to GI for another colonsoscopy. Refer to Surgery for the umbilical hernia. I personally spent a total of 35 minutes in the care of the patient today including getting/reviewing separately obtained history, performing a medically appropriate exam/evaluation, placing orders, referring and communicating with other health care professionals, and documenting clinical information in the EHR.   Garnette Olmsted, MD

## 2024-05-04 ENCOUNTER — Ambulatory Visit: Payer: Self-pay | Admitting: Family Medicine

## 2024-05-04 ENCOUNTER — Telehealth: Payer: Self-pay

## 2024-05-04 DIAGNOSIS — I890 Lymphedema, not elsewhere classified: Secondary | ICD-10-CM

## 2024-05-04 NOTE — Telephone Encounter (Signed)
 Copied from CRM [id number]. Topic: Referral - Status >> May 04, 2024  4:39 PM Charolett L wrote: Reason for CRM: Twyla from [facility]  for lymphedema treatment Medicare is requiring patient to have a referral for treatment or they will have to stop treatment Fax# [phone] Attn [place]

## 2024-05-05 ENCOUNTER — Other Ambulatory Visit: Payer: Self-pay | Admitting: Family Medicine

## 2024-05-05 ENCOUNTER — Telehealth: Payer: Self-pay

## 2024-05-05 MED ORDER — CLINDAMYCIN HCL 300 MG PO CAPS: 300.0000 mg | ORAL_CAPSULE | Freq: Three times a day (TID) | ORAL | 0 refills | Status: DC

## 2024-05-05 MED ORDER — DOXYCYCLINE HYCLATE 100 MG PO TABS: 100.0000 mg | ORAL_TABLET | Freq: Two times a day (BID) | ORAL | 11 refills | Status: AC

## 2024-05-05 NOTE — Addendum Note (Signed)
 Addended by: JOHNNY SENIOR A on: 05/05/2024 10:43 AM   Modules accepted: Orders

## 2024-05-05 NOTE — Telephone Encounter (Signed)
 I did the referral

## 2024-05-05 NOTE — Telephone Encounter (Signed)
 Pt referral was succefully faxed to [facility]

## 2024-05-05 NOTE — Telephone Encounter (Unsigned)
 Copied from CRM #[id number]. Topic: Clinical - Medication Refill >> May 05, 2024 10:20 AM Ahlexyia S wrote: Medication: doxycycline  (VIBRA -TABS) 100 MG tablet  Has the patient contacted their pharmacy? No (Agent: If no, request that the patient contact the pharmacy for the refill. If patient does not wish to contact the pharmacy document the reason why and proceed with request.) (Agent: If yes, when and what did the pharmacy advise?)  This is the patient's preferred pharmacy:   [facility] [phone] - [place], [place] - [address] AT [facility] [address] [facility] [address] & [address] [place] [address] Phone: [phone] Fax: [phone]  Is this the correct pharmacy for this prescription? Yes If no, delete pharmacy and type the correct one.   Has the prescription been filled recently? Yes  Is the patient out of the medication? No, limited supply  Has the patient been seen for an appointment in the last year OR does the patient have an upcoming appointment? Yes  Can we respond through MyChart? Yes  Agent: Please be advised that Rx refills may take up to 3 business days. We ask that you follow-up with your pharmacy.

## 2024-05-05 NOTE — Telephone Encounter (Signed)
 Copied from CRM #[id number]. Topic: Clinical - Medication Question >> May 05, 2024 10:24 AM Ahlexyia S wrote: Reason for CRM: Pt wife Barnie called in wanting to know if there is an update on medication clindamycin  HCl 300 mg capsule. Barnie would like a callback for this.

## 2024-05-05 NOTE — Telephone Encounter (Signed)
 I refilled the Doxycyline (he is not taking Clindamycin )

## 2024-05-05 NOTE — Progress Notes (Signed)
 I believe this was about Doxycycline  ( he is not taking Clindamycin ). I refilled this for a year

## 2024-05-06 ENCOUNTER — Other Ambulatory Visit: Payer: Self-pay | Admitting: Family Medicine

## 2024-05-06 NOTE — Telephone Encounter (Signed)
 Spoke with stated that she picked up both her and spouse Rx from the pharmacy

## 2024-05-06 NOTE — Telephone Encounter (Signed)
Rx was sent to pt pharmacy 

## 2024-05-06 NOTE — Telephone Encounter (Unsigned)
 Copied from CRM [id number]. Topic: Clinical - Medication Refill >> May 06, 2024  5:00 PM Nessti S wrote: Medication: atorvastatin  (LIPITOR) 20 MG tablet  Has the patient contacted their pharmacy? Yes (Agent: If no, request that the patient contact the pharmacy for the refill. If patient does not wish to contact the pharmacy document the reason why and proceed with request.) (Agent: If yes, when and what did the pharmacy advise?)  This is the patient's preferred pharmacy:  [facility] - [place], [place] - [address] [place] [address] Phone: [phone] Fax: [phone]  Is this the correct pharmacy for this prescription? Yes If no, delete pharmacy and type the correct one.   Has the prescription been filled recently? Yes  Is the patient out of the medication? No  Has the patient been seen for an appointment in the last year OR does the patient have an upcoming appointment? Yes  Can we respond through MyChart? Yes  Agent: Please be advised that Rx refills may take up to 3 business days. We ask that you follow-up with your pharmacy.

## 2024-05-07 ENCOUNTER — Telehealth: Payer: Self-pay | Admitting: Neurology

## 2024-05-07 MED ORDER — ATORVASTATIN CALCIUM 20 MG PO TABS: 20.0000 mg | ORAL_TABLET | Freq: Every day | ORAL | 3 refills | Status: AC

## 2024-05-07 NOTE — Telephone Encounter (Signed)
 Pt would like to discuss phys-therapy and Rx change

## 2024-05-07 NOTE — Telephone Encounter (Signed)
 Called patient and he requested a new PT referral. Patient also said med change has been horrible he can not walk , balance is off, can not get in or out of chairs, falls asleep in the middle of dinner holding his soda , He said it has been horrible and wants to know what else can be done

## 2024-05-11 ENCOUNTER — Ambulatory Visit: Admitting: Family Medicine

## 2024-05-11 DIAGNOSIS — I890 Lymphedema, not elsewhere classified: Secondary | ICD-10-CM | POA: Diagnosis not present

## 2024-05-14 NOTE — Telephone Encounter (Signed)
 Patient is currently taken Hydocodone 5mg  1 tab po every day prn. States,the sleepiness continues, balance is much worse since last visit 03/18/2024. Wanted me to be sure and let you know this. Says he needs Help.

## 2024-05-15 ENCOUNTER — Telehealth: Payer: Self-pay | Admitting: Neurology

## 2024-05-15 NOTE — Telephone Encounter (Signed)
 Called patient back and explained the medication he is starting Pt on Nov 13th

## 2024-05-15 NOTE — Telephone Encounter (Signed)
 Pt is calling to speak with nurse and asking for help from previous conversation on 05/14/24 telephone note

## 2024-05-15 NOTE — Telephone Encounter (Signed)
 Called pateint and explained medication and he is starting Pt on Nov 13th

## 2024-05-25 DIAGNOSIS — I890 Lymphedema, not elsewhere classified: Secondary | ICD-10-CM | POA: Diagnosis not present

## 2024-05-27 ENCOUNTER — Other Ambulatory Visit: Payer: Self-pay | Admitting: Family Medicine

## 2024-05-27 ENCOUNTER — Telehealth: Payer: Self-pay | Admitting: Family Medicine

## 2024-05-27 ENCOUNTER — Ambulatory Visit: Payer: Self-pay

## 2024-05-27 MED ORDER — CLINDAMYCIN HCL 300 MG PO CAPS: 300.0000 mg | ORAL_CAPSULE | Freq: Three times a day (TID) | ORAL | 1 refills | Status: AC

## 2024-05-27 MED ORDER — CLINDAMYCIN HCL 300 MG PO CAPS: 300.0000 mg | ORAL_CAPSULE | Freq: Three times a day (TID) | ORAL | 0 refills | Status: DC

## 2024-05-27 NOTE — Telephone Encounter (Signed)
 Done

## 2024-05-27 NOTE — Telephone Encounter (Signed)
 Copied from CRM [id number]. Topic: Clinical - Medication Refill >> May 27, 2024  2:52 PM Rosina BIRCH wrote: Medication: clindamycin   Has the patient contacted their pharmacy? Yes (Agent: If no, request that the patient contact the pharmacy for the refill. If patient does not wish to contact the pharmacy document the reason why and proceed with request.) (Agent: If yes, when and what did the pharmacy advise?)  This is the patient's preferred pharmacy:  [facility] - [place], [place] - [address] [place] [address] Phone: [phone] Fax: [phone]  Is this the correct pharmacy for this prescription? Yes If no, delete pharmacy and type the correct one.   Has the prescription been filled recently? Yes  Is the patient out of the medication? No  Has the patient been seen for an appointment in the last year OR does the patient have an upcoming appointment? Yes  Can we respond through MyChart? Yes  Agent: Please be advised that Rx refills may take up to 3 business days. We ask that you follow-up with your pharmacy.

## 2024-05-27 NOTE — Progress Notes (Signed)
 Done

## 2024-05-27 NOTE — Telephone Encounter (Signed)
 FYI Only or Action Required?: Action required by provider: medication refill request.  Patient was last seen in primary care on 05/01/2024 by Johnny Garnette LABOR, MD.  Called Nurse Triage reporting Recurrent Skin Infections and Medication Refill.  Symptoms began n/a.  Interventions attempted: Other: n/a.  Symptoms are: n/a.  Triage Disposition: Call PCP When Office is Open  Patient/caregiver understands and will follow disposition?: Yes    Copied from CRM [id number]. Topic: Clinical - Medication Refill >> May 27, 2024  2:52 PM Rosina BIRCH wrote: Medication: clindamycin    Has the patient contacted their pharmacy? Yes (Agent: If no, request that the patient contact the pharmacy for the refill. If patient does not wish to contact the pharmacy document the reason why and proceed with request.) (Agent: If yes, when and what did the pharmacy advise?)   This is the patient's preferred pharmacy:  [facility] - [place], [place] - [address] [place] [address] Phone: [phone] Fax: [phone]   Is this the correct pharmacy for this prescription? Yes If no, delete pharmacy and type the correct one.    Has the prescription been filled recently? Yes   Is the patient out of the medication? No   Has the patient been seen for an appointment in the last year OR does the patient have an upcoming appointment? Yes   Can we respond through MyChart? Yes   Agent: Please be advised that Rx refills may take up to 3 business days. We ask that you follow-up with your pharmacy.  Reason for Disposition  [1] Prescription refill request for NON-ESSENTIAL medicine (i.e., no harm to patient if med not taken) AND [2] triager unable to refill per department policy  Answer Assessment - Initial Assessment Questions Additional info: Patient is not ill at this time, he is requesting clindamycin  to prevent cellulitis secondary to chronic lymphedema, he states  antibiotics is a strategy Dr. Johnny came up with to prevent his recurring skin infections.  See refill encounter dated today 05/27/24    1. DRUG NAME: What medicine do you need to have refilled?     Clindamycin  2. REFILLS REMAINING: How many refills are remaining? Notes: The label on the medicine or pill bottle will show how many refills are remaining. If there are no refills remaining, then a renewal may be needed.     0 3. EXPIRATION DATE: What is the expiration date? Note: The label states when the prescription will expire, and thus can no longer be refilled.)      4. PRESCRIBER: Who prescribed it? Note: The prescribing doctor or group is responsible for refill approvals..     Dr. Johnny  5. PHARMACY: Have you contacted your pharmacy (drugstore)? Note: Some pharmacies will contact the doctor (or NP/PA).       6. SYMPTOMS: Do you have any symptoms?     None at this time, lymphedema causes cellulitis  7. PREGNANCY: Is there any chance that you are pregnant? When was your last menstrual period?  Protocols used: Medication Refill and Renewal Call-A-AH

## 2024-05-27 NOTE — Telephone Encounter (Signed)
 Unable to convert patient call crm to nurse triage encounter. Signing to close. See nurse triage 05/27/24 for details of call.

## 2024-05-28 DIAGNOSIS — M6281 Muscle weakness (generalized): Secondary | ICD-10-CM | POA: Diagnosis not present

## 2024-05-28 DIAGNOSIS — R2689 Other abnormalities of gait and mobility: Secondary | ICD-10-CM | POA: Diagnosis not present

## 2024-05-29 DIAGNOSIS — K429 Umbilical hernia without obstruction or gangrene: Secondary | ICD-10-CM | POA: Diagnosis not present

## 2024-06-01 DIAGNOSIS — I890 Lymphedema, not elsewhere classified: Secondary | ICD-10-CM | POA: Diagnosis not present

## 2024-06-04 DIAGNOSIS — R2689 Other abnormalities of gait and mobility: Secondary | ICD-10-CM | POA: Diagnosis not present

## 2024-06-04 DIAGNOSIS — M6281 Muscle weakness (generalized): Secondary | ICD-10-CM | POA: Diagnosis not present

## 2024-06-05 ENCOUNTER — Telehealth: Payer: Self-pay | Admitting: Neurology

## 2024-06-05 NOTE — Telephone Encounter (Signed)
 Who's calling (name and relationship to patient) : [name]; Self  Best contact number: [phone]  Provider they see: Dr. Evonnie   Reason for call: [name] [name] called in stating that he will need a Clearance letter to [facility]. He was informed that a request was sent over a few days ago.

## 2024-06-08 DIAGNOSIS — I890 Lymphedema, not elsewhere classified: Secondary | ICD-10-CM | POA: Diagnosis not present

## 2024-06-08 NOTE — Telephone Encounter (Signed)
 Form is in Dr. Martie box

## 2024-06-09 ENCOUNTER — Encounter: Payer: Self-pay | Admitting: Neurology

## 2024-06-15 ENCOUNTER — Telehealth: Payer: Self-pay | Admitting: Family Medicine

## 2024-06-15 DIAGNOSIS — D225 Melanocytic nevi of trunk: Secondary | ICD-10-CM | POA: Diagnosis not present

## 2024-06-15 DIAGNOSIS — Z85828 Personal history of other malignant neoplasm of skin: Secondary | ICD-10-CM | POA: Diagnosis not present

## 2024-06-15 DIAGNOSIS — L821 Other seborrheic keratosis: Secondary | ICD-10-CM | POA: Diagnosis not present

## 2024-06-15 DIAGNOSIS — I872 Venous insufficiency (chronic) (peripheral): Secondary | ICD-10-CM | POA: Diagnosis not present

## 2024-06-15 DIAGNOSIS — D485 Neoplasm of uncertain behavior of skin: Secondary | ICD-10-CM | POA: Diagnosis not present

## 2024-06-15 DIAGNOSIS — D1801 Hemangioma of skin and subcutaneous tissue: Secondary | ICD-10-CM | POA: Diagnosis not present

## 2024-06-15 DIAGNOSIS — L814 Other melanin hyperpigmentation: Secondary | ICD-10-CM | POA: Diagnosis not present

## 2024-06-15 DIAGNOSIS — I87391 Chronic venous hypertension (idiopathic) with other complications of right lower extremity: Secondary | ICD-10-CM | POA: Diagnosis not present

## 2024-06-15 DIAGNOSIS — Z08 Encounter for follow-up examination after completed treatment for malignant neoplasm: Secondary | ICD-10-CM | POA: Diagnosis not present

## 2024-06-15 DIAGNOSIS — L578 Other skin changes due to chronic exposure to nonionizing radiation: Secondary | ICD-10-CM | POA: Diagnosis not present

## 2024-06-15 DIAGNOSIS — L570 Actinic keratosis: Secondary | ICD-10-CM | POA: Diagnosis not present

## 2024-06-15 DIAGNOSIS — L905 Scar conditions and fibrosis of skin: Secondary | ICD-10-CM | POA: Diagnosis not present

## 2024-06-15 NOTE — Telephone Encounter (Signed)
 Done

## 2024-06-17 DIAGNOSIS — R2689 Other abnormalities of gait and mobility: Secondary | ICD-10-CM | POA: Diagnosis not present

## 2024-06-17 DIAGNOSIS — M6281 Muscle weakness (generalized): Secondary | ICD-10-CM | POA: Diagnosis not present

## 2024-06-18 DIAGNOSIS — M25551 Pain in right hip: Secondary | ICD-10-CM | POA: Diagnosis not present

## 2024-06-22 ENCOUNTER — Telehealth: Payer: Self-pay | Admitting: Family Medicine

## 2024-06-22 NOTE — Telephone Encounter (Unsigned)
 Copied from CRM #[id number]. Topic: Clinical - Medication Refill >> Jun 22, 2024  4:25 PM Jasmin G wrote: Medication: hydrochlorothiazide  (MICROZIDE ) 12.5 MG capsule  Has the patient contacted their pharmacy? No (Agent: If no, request that the patient contact the pharmacy for the refill. If patient does not wish to contact the pharmacy document the reason why and proceed with request.) (Agent: If yes, when and what did the pharmacy advise?)  This is the patient's preferred pharmacy:  [facility] - [place], [place] - [address] [place] [address] Phone: [phone] Fax: [phone]  Is this the correct pharmacy for this prescription? Yes If no, delete pharmacy and type the correct one.   Has the prescription been filled recently? Yes  Is the patient out of the medication? No  Has the patient been seen for an appointment in the last year OR does the patient have an upcoming appointment? Yes  Can we respond through MyChart? No  Agent: Please be advised that Rx refills may take up to 3 business days. We ask that you follow-up with your pharmacy.

## 2024-06-23 ENCOUNTER — Ambulatory Visit: Payer: Self-pay

## 2024-06-23 NOTE — Telephone Encounter (Signed)
 FYI Only or Action Required?: FYI only for provider: appointment scheduled on 06/24/2024.  Patient was last seen in primary care on 05/01/2024 by [name] [name] LABOR, MD.  Called Nurse Triage reporting Leg Swelling.  Symptoms began worsening since last night.  Interventions attempted: Other: massage, leg wraps.  Symptoms are: gradually worsening.  Triage Disposition: See Physician Within 24 Hours  Patient/caregiver understands and will follow disposition?: Yes    Copied from CRM #[id number]. Topic: Clinical - Red Word Triage >> Jun 23, 2024 12:22 PM Corin V wrote: [facility] that prompted transfer to Nurse Triage: Patient is having swelling in both legs. It started a few days ago and has been worsening since last night.. Reason for Disposition  [1] MODERATE leg swelling (e.g., swelling extends up to knees) AND [2] new-onset or getting worse  Answer Assessment - Initial Assessment Questions 1. ONSET: When did the swelling start? (e.g., minutes, hours, days)     X worsening last night 2. LOCATION: What part of the leg is swollen?  Are both legs swollen or just one leg?     Bilateral legs - more in left than right 3. SEVERITY: How bad is the swelling? (e.g., localized; mild, moderate, severe)     Moderate to severe 4. REDNESS: Is there redness or signs of infection?     no 5. PAIN: Is the swelling painful to touch? If Yes, ask: How painful is it?   (Scale 1-10; mild, moderate or severe)     Mild to moderate 6. FEVER: Do you have a fever? If Yes, ask: What is it, how was it measured, and when did it start?      no 7. CAUSE: What do you think is causing the leg swelling?     Lymphadema 8. MEDICAL HISTORY: Do you have a history of blood clots (e.g., DVT), cancer, heart failure, kidney disease, or liver failure?     no 9. RECURRENT SYMPTOM: Have you had leg swelling before? If Yes, ask: When was the last time? What happened that time?     yes 10. OTHER SYMPTOMS:  Do you have any other symptoms? (e.g., chest pain, difficulty breathing)       no 11. PREGNANCY: Is there any chance you are pregnant? When was your last menstrual period?       no  Leaking groin area and back of ear. Wears pumps and massages  Protocols used: Leg Swelling and Edema-A-AH

## 2024-06-23 NOTE — Telephone Encounter (Signed)
 Pt stated he already saw this office about 2 weeks ago. He informed me that he had a doppler done and lab work and was advised everything was good. Pt stated that he want to speak to PCP to have him look threw things.

## 2024-06-23 NOTE — Telephone Encounter (Signed)
 He should follow up with Dr. Sheree at [facility]

## 2024-06-24 ENCOUNTER — Encounter: Payer: Self-pay | Admitting: Family Medicine

## 2024-06-24 ENCOUNTER — Ambulatory Visit: Admitting: Family Medicine

## 2024-06-24 VITALS — BP 138/72 | HR 93 | Temp 97.8°F | Ht 71.0 in | Wt 270.0 lb

## 2024-06-24 DIAGNOSIS — I890 Lymphedema, not elsewhere classified: Secondary | ICD-10-CM

## 2024-06-24 MED ORDER — HYDROCHLOROTHIAZIDE 12.5 MG PO CAPS: 12.5000 mg | ORAL_CAPSULE | Freq: Every day | ORAL | 0 refills | Status: AC

## 2024-06-24 NOTE — Telephone Encounter (Signed)
 He is on our schedule to be seen today

## 2024-06-24 NOTE — Progress Notes (Signed)
° °  Subjective:    Patient ID: [name] [name], male    DOB: 02/13/1951, 73 y.o.   MRN: [id number]  HPI Here asking for advice about his leg swelling. He has lymphedema, and he has been under the care of Dr. Woodward Colorado of [facility]. He last saw [name] [name] on 06-16-24. [name] [name] has been wearing his compression stockings 10-12 hours a day. He is seeing PT twice a week for massage therapy, and his wife is now doing this as well several days a week. He exercises as much as he can. The swelling suddenly got worse a week ago, so he made this appt. Now since yesterday the swelling has gone back down a bit. He asks if there is anything else we can do.    Review of Systems  Constitutional: Negative.   Respiratory: Negative.    Cardiovascular:  Positive for leg swelling. Negative for chest pain and palpitations.       Objective:   Physical Exam Constitutional:      Comments: He walks stiffly   Cardiovascular:     Rate and Rhythm: Normal rate and regular rhythm.     Pulses: Normal pulses.     Heart sounds: Normal heart sounds.  Pulmonary:     Effort: Pulmonary effort is normal.     Breath sounds: Normal breath sounds.  Musculoskeletal:     Comments: 4+ edema in both legs from the hips down  Neurological:     Mental Status: He is alert.           Assessment & Plan:  Lymphedema. I told him that he is getting all the treatments that I know of. He will follow up with Dr. Colorado. I did mention to [name] [name] the possibility of enrolling in a research protocol at a local tertiary medical center, and he said he would think about this.  [name] Olmsted, MD

## 2024-06-25 ENCOUNTER — Telehealth: Payer: Self-pay | Admitting: *Deleted

## 2024-06-25 NOTE — Telephone Encounter (Signed)
 Copied from CRM #[id number]. Topic: Clinical - Order For Equipment >> Jun 25, 2024 12:55 PM Harlene ORN wrote: Reason for CRM: Patient called requesting a letter from PCP for a stair lift. Will not be charged tax if a letter is sent approved by the PCP. Message back via MyChart.

## 2024-06-26 ENCOUNTER — Telehealth: Payer: Self-pay

## 2024-06-26 NOTE — Telephone Encounter (Signed)
 The letter is ready

## 2024-06-26 NOTE — Telephone Encounter (Signed)
 Copied from CRM #[id number]. Topic: Clinical - Order For Equipment >> Jun 25, 2024 12:55 PM Harlene ORN wrote: Reason for CRM: Patient called requesting a letter from PCP for a stair lift. Will not be charged tax if a letter is sent approved by the PCP. Message back via MyChart. >> Jun 26, 2024  1:12 PM Paige D wrote: Pt is calling again in regards to a letter of approval from pcp to have stair lift in his home. PT will not be charged taxes if he can get the letter of approval to the company today whom are already there at his home installing the stair lift. Pt says you can upload it on my cahrt. Please reach out to pt for any further info

## 2024-06-29 ENCOUNTER — Telehealth: Payer: Self-pay | Admitting: *Deleted

## 2024-06-29 NOTE — Telephone Encounter (Signed)
 Copied from CRM #[id number]. Topic: General - Other >> Jun 26, 2024  4:52 PM [name] wrote: Reason for CRM: Patient spouse [name] [name] called regarding the letter for patients stair lift - the company needs the letter to include patients dob 2051/03/12 and Customer Number: [phone]. [name] [name] says she sorry and if she'd known prior she would of informed you. Please call her when letter is update at [phone] [facility])

## 2024-06-30 ENCOUNTER — Telehealth: Payer: Self-pay | Admitting: *Deleted

## 2024-06-30 NOTE — Telephone Encounter (Signed)
 Copied from CRM #[id number]. Topic: Clinical - Order For Equipment >> Jun 25, 2024 12:55 PM Harlene ORN wrote: Reason for CRM: Patient called requesting a letter from PCP for a stair lift. Will not be charged tax if a letter is sent approved by the PCP. Message back via MyChart. >> Jun 26, 2024  1:12 PM Paige D wrote: Pt is calling again in regards to a letter of approval from pcp to have stair lift in his home. PT will not be charged taxes if he can get the letter of approval to the company today whom are already there at his home installing the stair lift. Pt says you can upload it on my cahrt. Please reach out to pt for any further info

## 2024-06-30 NOTE — Telephone Encounter (Signed)
 Already done

## 2024-06-30 NOTE — Telephone Encounter (Signed)
 Done

## 2024-06-30 NOTE — Telephone Encounter (Signed)
 Spoke with [name] [name]/ spouse states that they printed the letter off MyChart portal. [name] [name] wanted to let Dr Johnny know that he has a red rash in the groin area, request advise on if to use the antibiotic cream prescribed by Dr Johnny in his last appointment. Please advise

## 2024-07-01 MED ORDER — KETOCONAZOLE 2 % EX CREA: 1.0000 | TOPICAL_CREAM | Freq: Two times a day (BID) | CUTANEOUS | 3 refills | Status: DC | PRN

## 2024-07-01 NOTE — Telephone Encounter (Signed)
 Spoke with pt advised of Dr Alyne Babinski recommendation, pt voiced understanding

## 2024-07-01 NOTE — Addendum Note (Signed)
 Addended by: JOHNNY SENIOR A on: 07/01/2024 07:50 AM   Modules accepted: Orders

## 2024-07-01 NOTE — Telephone Encounter (Signed)
 Actually that sounds more like a fungal infection than a bacterial one. I sent in a RX for Ketoconazole  cream to use as needed

## 2024-07-17 ENCOUNTER — Ambulatory Visit: Admitting: Family Medicine

## 2024-07-17 ENCOUNTER — Encounter: Payer: Self-pay | Admitting: Family Medicine

## 2024-07-17 VITALS — BP 132/78 | HR 94 | Temp 97.9°F | Wt 267.0 lb

## 2024-07-17 DIAGNOSIS — B356 Tinea cruris: Secondary | ICD-10-CM

## 2024-07-17 MED ORDER — FLUCONAZOLE 200 MG PO TABS: 200.0000 mg | ORAL_TABLET | Freq: Every day | ORAL | 0 refills | Status: DC

## 2024-07-17 NOTE — Progress Notes (Signed)
" ° °  Subjective:    Patient ID: [name] [name], male    DOB: 06/20/1951, 74 y.o.   MRN: [id number]  HPI Here to follow up on a Candida infection in the groin. We saw him on 06-24-24 for this and started him on Ketoconazole  cream BID. This helped a little, but the rash persists.    Review of Systems  Constitutional: Negative.   Respiratory: Negative.    Skin:  Positive for rash.       Objective:   Physical Exam Constitutional:      Appearance: Normal appearance.  Cardiovascular:     Rate and Rhythm: Normal rate and regular rhythm.     Pulses: Normal pulses.     Heart sounds: Normal heart sounds.  Pulmonary:     Effort: Pulmonary effort is normal.     Breath sounds: Normal breath sounds.  Skin:    Comments: There is a macular red rash with satellite lesions in both groins and over the scrotum   Neurological:     Mental Status: He is alert.           Assessment & Plan:  Candidiasis. He will continue applying Ketoconazole  cream, and we will add Fluconazole 200 mg tablets daily for 14 days. Recheck as needed.  Garnette Olmsted, MD   "

## 2024-07-27 ENCOUNTER — Other Ambulatory Visit: Payer: Self-pay | Admitting: Family Medicine

## 2024-07-27 ENCOUNTER — Ambulatory Visit: Payer: Self-pay | Admitting: Surgery

## 2024-07-27 ENCOUNTER — Telehealth: Payer: Self-pay | Admitting: *Deleted

## 2024-07-27 DIAGNOSIS — M5416 Radiculopathy, lumbar region: Secondary | ICD-10-CM

## 2024-07-27 NOTE — Telephone Encounter (Signed)
 Copied from CRM #[id number]. Topic: Clinical - Medication Refill >> Jul 27, 2024  8:17 AM Berneda FALCON wrote: Medication: fluconazole  (DIFLUCAN ) 200 MG tablet ketoconazole  (NIZORAL ) 2 % cream  Has the patient contacted their pharmacy? Yes (Agent: If no, request that the patient contact the pharmacy for the refill. If patient does not wish to contact the pharmacy document the reason why and proceed with request.) (Agent: If yes, when and what did the pharmacy advise?)  This is the patient's preferred pharmacy:   [facility] [phone] - [place], [place] - [address] AT [facility] [address] [facility] [address] [place] [address] Phone: [phone] Fax: [phone]  Is this the correct pharmacy for this prescription? Yes If no, delete pharmacy and type the correct one.   Has the prescription been filled recently? No  Is the patient out of the medication? No  Has the patient been seen for an appointment in the last year OR does the patient have an upcoming appointment? Yes  Can we respond through MyChart? Yes  Agent: Please be advised that Rx refills may take up to 3 business days. We ask that you follow-up with your pharmacy.

## 2024-07-27 NOTE — Telephone Encounter (Signed)
 Pt notified, voiced understanding.

## 2024-07-27 NOTE — Telephone Encounter (Signed)
 I placed a referral to see [facility] in [place] (these are the same providers that we have in [place])

## 2024-07-27 NOTE — Telephone Encounter (Signed)
 Copied from CRM #[id number]. Topic: Referral - Request for Referral >> Jul 27, 2024  8:21 AM Berneda FALCON wrote: Did the patient discuss referral with their provider in the last year? Yes, this has been ongoing with patient for a long time (If No - schedule appointment) (If Yes - send message)  Appointment offered? Yes  Type of order/referral and detailed reason for visit: Patient states this has been ongoing for a long time now and has spinal stenosis  Preference of office, provider, location: Spine doctor, preferrs [place] or [place] if needed  If referral order, have you been seen by this specialty before? No (If Yes, this issue or another issue? When? Where?  Can we respond through MyChart? Yes

## 2024-07-28 ENCOUNTER — Other Ambulatory Visit: Payer: Self-pay | Admitting: Family Medicine

## 2024-07-29 ENCOUNTER — Telehealth: Payer: Self-pay | Admitting: *Deleted

## 2024-07-29 MED ORDER — FLUCONAZOLE 200 MG PO TABS: 200.0000 mg | ORAL_TABLET | Freq: Every day | ORAL | 5 refills | Status: AC

## 2024-07-29 MED ORDER — KETOCONAZOLE 2 % EX CREA: 1.0000 | TOPICAL_CREAM | Freq: Two times a day (BID) | CUTANEOUS | 5 refills | Status: AC | PRN

## 2024-07-29 NOTE — Telephone Encounter (Signed)
 Done

## 2024-07-29 NOTE — Telephone Encounter (Signed)
 Copied from CRM #[id number]. Topic: Clinical - Medication Refill >> Jul 27, 2024  8:17 AM Berneda FALCON wrote: Medication: fluconazole  (DIFLUCAN ) 200 MG tablet ketoconazole  (NIZORAL ) 2 % cream  Has the patient contacted their pharmacy? Yes (Agent: If no, request that the patient contact the pharmacy for the refill. If patient does not wish to contact the pharmacy document the reason why and proceed with request.) (Agent: If yes, when and what did the pharmacy advise?)  This is the patient's preferred pharmacy:   [facility] [phone] - [place], [place] - [address] AT [facility] [address] [facility] [address] & [address] [place] [address] Phone: [phone] Fax: [phone]  Is this the correct pharmacy for this prescription? Yes If no, delete pharmacy and type the correct one.   Has the prescription been filled recently? No  Is the patient out of the medication? No  Has the patient been seen for an appointment in the last year OR does the patient have an upcoming appointment? Yes  Can we respond through MyChart? Yes  Agent: Please be advised that Rx refills may take up to 3 business days. We ask that you follow-up with your pharmacy. >> Jul 29, 2024 10:20 AM Emylou G wrote: Spouse called.. fluconazole  (DIFLUCAN ) 200 MG tablet was refused.. Patient was advised this would be okay to fill.SABRA Pls advise.SABRA

## 2024-07-30 ENCOUNTER — Telehealth: Payer: Self-pay

## 2024-07-30 NOTE — Telephone Encounter (Signed)
 Copied from CRM [id number]. Topic: Clinical - Medication Question >> Jul 29, 2024  3:43 PM Suzen RAMAN wrote: Reason for CRM: Patient would like a call back from Nancy to further discuss medication refill request  fluconazole  (DIFLUCAN ) 200 MG tablet.    CB# [phone] >> Jul 29, 2024  3:47 PM Suzen RAMAN wrote: Patient wife Barnie would like a call back

## 2024-07-30 NOTE — Telephone Encounter (Signed)
 Pt.notified

## 2024-07-30 NOTE — Telephone Encounter (Signed)
 Spoke with pt confirmed he picked up Rx from his pharmacy

## 2024-08-03 ENCOUNTER — Ambulatory Visit: Payer: Self-pay

## 2024-08-03 NOTE — Telephone Encounter (Signed)
" ° ° ° ° ° °  Reason for Triage: severe stomach pain, rock hard stomach, unable to eat in the past 4 days and vomiting what he tries to eat. Low temp 96 and cool to the touch, flushed cheeks.    Reason for Disposition  [1] SEVERE pain AND [2] age > 60 years  Answer Assessment - Initial Assessment Questions Pts wife states he has a bm daily with miralax  use daily. Wednesday of last week he began to ad colace as he had not had a BM. She states he had a very small BM this am. Which is the first since last Wednesday or Thursday. She states his stomach is rock hard. He can only eat a couple of bites and then has abdominal pain, nausea, and mostly dry heaving. She states today he ate 3-4 bites of oatmeal and 2 hours later vomited it all back up. She is trying to get him to drink water  and gatorade but he wants pop has it helps him burb and relieve some of the pressure. She states he is supposed to have surgery in about 2 weeks and he's been on 2 atbx for potential cellulitis. They've been researching diverticulitis  as they think that may be the cause. He has diverticulosis but never had any problems with it. Temp is 96ish. She's been sleeping on the couch to be near the pt who is sleeping in a recliner. RN advised Er due to pain, lack of bm, vomiting. She states she would rather he have an appt with Dr. Johnny. RN gave rationale for why the ER, will send message to office. She asked when she'd hear back, RN advised not to hold off on hearing from Dr. Mira office, would go to ER for reasons stated possible diverticulitis, blockage, etc. She stated she would try.  Due to Ed dispo not all questions anwered   1. LOCATION: Where does it hurt?      Above belly button 2. RADIATION: Does the pain shoot anywhere else? (e.g., chest, back)      3. ONSET: When did the pain begin? (Minutes, hours or days ago)      Wednesday or Thursday last week.  4. SUDDEN: Gradual or sudden onset?      5. PATTERN Does the  pain come and go, or is it constant?      6. SEVERITY: How bad is the pain?  (e.g., Scale 1-10; mild, moderate, or severe)     Wife states 10 7. RECURRENT SYMPTOM: Have you ever had this type of stomach pain before? If Yes, ask: When was the last time? and What happened that time?       8. CAUSE: What do you think is causing the stomach pain? (e.g., gallstones, recent abdominal surgery)      9. RELIEVING/AGGRAVATING FACTORS: What makes it better or worse? (e.g., antacids, bending or twisting motion, bowel movement)      10. OTHER SYMPTOMS: Do you have any other symptoms? (e.g., back pain, diarrhea, fever, urination pain, vomiting)       Vomiting, rock hard abdomen  Protocols used: Abdominal Pain - Male-A-AH  "

## 2024-08-03 NOTE — Telephone Encounter (Signed)
 FYI Only or Action Required?: FYI only for provider: ED advised.  Patient was last seen in primary care on 07/17/2024 by Johnny Garnette LABOR, MD.  Called Nurse Triage reporting Abdominal Pain.  Symptoms began several days ago.  Interventions attempted: OTC medications: miralax  and colace.  Symptoms are: gradually worsening.  Triage Disposition: Go to ED Now (Notify PCP)  Patient/caregiver understands and will follow disposition?: Unsure

## 2024-08-04 ENCOUNTER — Telehealth: Payer: Self-pay

## 2024-08-04 NOTE — Telephone Encounter (Signed)
 Left a message for pt advised to call the office back regarding this encounter

## 2024-08-04 NOTE — Telephone Encounter (Signed)
 Pt wife called to notify Dr Johnny that pt will be having Hernia surgery today. Message sent to Dr Johnny

## 2024-08-04 NOTE — Telephone Encounter (Signed)
 Copied from CRM [id number]. Topic: Clinical - Medical Advice >> Aug 04, 2024  8:20 AM Harlene ORN wrote: Reason for CRM: Patient's wife, Barnie called. Wanted to let the PCP and Practice know that the patient will be going into hernia surgery today.

## 2024-08-07 ENCOUNTER — Telehealth: Payer: Self-pay

## 2024-08-07 NOTE — Telephone Encounter (Signed)
 Copied from CRM [id number]. Topic: Compliment - Staff >> Aug 07, 2024  2:50 PM Zebedee SAUNDERS wrote: Date of encounter: 08/18/2024 Details of compliment: Surgery for hernia repair Who would the patient like to thank/see rewarded? Heather NP On a scale of 1-10, how was your experience? 10, Nurse Heather saved pt's life! Route to Research Officer, [employer].

## 2024-08-07 NOTE — Transitions of Care (Post Inpatient/ED Visit) (Signed)
 "  08/07/2024  Name: [name] [name] MRN: [id number] DOB: [birth year]  Today's TOC FU Call Status: Today's TOC FU Call Status:: Successful TOC FU Call Completed TOC FU Call Complete Date: 08/07/24  Patient's Name and Date of Birth confirmed. Name, DOB  Transition Care Management Follow-up Telephone Call Date of Discharge: 08/06/24 Discharge Facility: Other [facility]) Name of Other (Non[facility]) Discharge Facility: [facility] Type of Discharge: Inpatient Admission Primary Inpatient Discharge Diagnosis:: hernia How have you been since you were released from the hospital?: Better Any questions or concerns?: No  Items Reviewed: Did you receive and understand the discharge instructions provided?: Yes Medications obtained,verified, and reconciled?: Yes (Medications Reviewed) Any new allergies since your discharge?: No Dietary orders reviewed?: Yes Do you have support at home?: Yes People in Home [RPT]: spouse  Medications Reviewed Today: Medications Reviewed Today     Reviewed by Emmitt Pan, LPN (Licensed Practical Nurse) on 08/07/24 at 0919  Med List Status: <None>   Medication Order Taking? Sig Documenting Provider Last Dose Status Informant  albuterol  (VENTOLIN  HFA) 108 (90 Base) MCG/ACT inhaler [id number] Yes Inhale 2 puffs into the lungs every 4 (four) hours as needed for wheezing or shortness of breath. Johnny Garnette LABOR, MD  Active   atorvastatin  (LIPITOR) 20 MG tablet [id number] Yes Take 1 tablet (20 mg total) by mouth daily. Johnny Garnette LABOR, MD  Active   carbidopa -levodopa  (SINEMET  IR) 25-100 MG tablet [id number] Yes 2 tablets at 6 AM/ 2 tablets at 9:30am/1 at 1pm/ 1 at 5pm Tat, Asberry RAMAN, DO  Active   clindamycin  (CLEOCIN ) 300 MG capsule [id number]  Take 1 capsule (300 mg total) by mouth 3 (three) times daily.  Patient not taking: Reported on 08/07/2024   Johnny Garnette LABOR, MD  Active   doxycycline  (VIBRA -TABS) 100 MG tablet [id number] Yes Take 1 tablet (100 mg total) by  mouth 2 (two) times daily. Johnny Garnette LABOR, MD  Active   esomeprazole  (NEXIUM ) 20 MG capsule [id number] Yes Take 1 capsule (20 mg total) by mouth 2 (two) times daily before a meal. Johnny Garnette LABOR, MD  Active   famotidine  (PEPCID ) 20 MG tablet [id number] Yes Take 20 mg by mouth 2 (two) times daily. [provider]  Active   finasteride  (PROSCAR ) 5 MG tablet [id number] Yes TAKE 1 TABLET DAILY (NEED AN APPOINTMENT) Johnny Garnette LABOR, MD  Active   fluconazole  (DIFLUCAN ) 200 MG tablet [id number]  Take 1 tablet (200 mg total) by mouth daily.  Patient not taking: Reported on 08/07/2024   Johnny Garnette LABOR, MD  Active   fluticasone -salmeterol (ADVAIR  HFA) 230-21 MCG/ACT inhaler [id number] Yes Inhale 2 puffs into the lungs 2 (two) times daily. Johnny Garnette LABOR, MD  Active   hydrochlorothiazide  (MICROZIDE ) 12.5 MG capsule [id number] Yes Take 1 capsule (12.5 mg total) by mouth daily. Johnny Garnette LABOR, MD  Active   HYDROcodone -acetaminophen  (NORCO/VICODIN) 5-325 MG tablet [id number]  Take 1 tablet by mouth every 8 (eight) hours as needed (pain).  Patient not taking: Reported on 08/07/2024   Johnny Garnette LABOR, MD  Active   ketoconazole  (NIZORAL ) 2 % cream [id number] Yes Apply 1 Application topically 2 (two) times daily as needed for irritation. Johnny Garnette LABOR, MD  Active   potassium chloride  (KLOR-CON ) 10 MEQ tablet [id number] Yes TAKE 1 TABLET DAILY Johnny Garnette LABOR, MD  Active   pramipexole  (MIRAPEX ) 0.5 MG tablet [id number] Yes Take 1 tablet (0.5 mg total) by mouth 3 (three) times daily. Evonnie Asberry RAMAN, DO  Active  sucralfate  (CARAFATE ) 1 g tablet [id number] Yes Take 1 g by mouth 4 (four) times daily. [provider]  Active   tamsulosin  (FLOMAX ) 0.4 MG CAPS capsule [id number] Yes TAKE 1 CAPSULE(0.4 MG) BY MOUTH DAILY Johnny Garnette LABOR, MD  Active             Home Care and Equipment/Supplies: Were Home Health Services Ordered?: Yes Name of Home Health Agency:: unknown Has Agency set up a time to come to your  home?: No Any new equipment or medical supplies ordered?: NA  Functional Questionnaire: Do you need assistance with bathing/showering or dressing?: Yes Do you need assistance with meal preparation?: Yes Do you need assistance with eating?: No Do you have difficulty maintaining continence: No Do you need assistance with getting out of bed/getting out of a chair/moving?: Yes  Follow up appointments reviewed: PCP Follow-up appointment confirmed?: Yes Date of PCP follow-up appointment?: 08/17/24 Follow-up Provider: [facility] Follow-up appointment confirmed?: No Reason Specialist Follow-Up Not Confirmed: Patient has Specialist Provider Number and will Call for Appointment Do you need transportation to your follow-up appointment?: No Do you understand care options if your condition(s) worsen?: Yes-patient verbalized understanding    SIGNATURE Julian Lemmings, LPN [facility] Nurse Health Advisor Direct Dial [phone]  "

## 2024-08-13 ENCOUNTER — Encounter (HOSPITAL_COMMUNITY): Admission: RE | Admit: 2024-08-13 | Source: Ambulatory Visit

## 2024-08-17 ENCOUNTER — Inpatient Hospital Stay: Admitting: Family Medicine

## 2024-08-18 ENCOUNTER — Ambulatory Visit (HOSPITAL_COMMUNITY): Admit: 2024-08-18 | Admitting: Surgery

## 2024-08-19 ENCOUNTER — Other Ambulatory Visit: Payer: Self-pay | Admitting: Family Medicine

## 2024-08-19 DIAGNOSIS — N401 Enlarged prostate with lower urinary tract symptoms: Secondary | ICD-10-CM

## 2024-10-21 ENCOUNTER — Ambulatory Visit: Admitting: Neurology
